# Patient Record
Sex: Male | Born: 1955
Health system: Southern US, Community
[De-identification: ages and names within clinical notes are randomized; demographics above are authoritative.]

## PROBLEM LIST (undated history)

## (undated) ENCOUNTER — Emergency Department (HOSPITAL_COMMUNITY): Admission: EM | Payer: Medicare HMO | Source: Home / Self Care

## (undated) DIAGNOSIS — I6529 Occlusion and stenosis of unspecified carotid artery: Secondary | ICD-10-CM

## (undated) DIAGNOSIS — J449 Chronic obstructive pulmonary disease, unspecified: Secondary | ICD-10-CM

## (undated) DIAGNOSIS — R9431 Abnormal electrocardiogram [ECG] [EKG]: Secondary | ICD-10-CM

## (undated) DIAGNOSIS — R0600 Dyspnea, unspecified: Secondary | ICD-10-CM

## (undated) DIAGNOSIS — I4891 Unspecified atrial fibrillation: Secondary | ICD-10-CM

## (undated) DIAGNOSIS — I2510 Atherosclerotic heart disease of native coronary artery without angina pectoris: Secondary | ICD-10-CM

## (undated) DIAGNOSIS — J9621 Acute and chronic respiratory failure with hypoxia: Secondary | ICD-10-CM

## (undated) DIAGNOSIS — F419 Anxiety disorder, unspecified: Secondary | ICD-10-CM

## (undated) DIAGNOSIS — I469 Cardiac arrest, cause unspecified: Secondary | ICD-10-CM

## (undated) HISTORY — PX: HERNIA REPAIR: SHX51

## (undated) HISTORY — PX: BACK SURGERY: SHX140

## (undated) HISTORY — PX: EYE SURGERY: SHX253

## (undated) HISTORY — DX: Occlusion and stenosis of unspecified carotid artery: I65.29

## (undated) HISTORY — PX: TONSILLECTOMY: SUR1361

## (undated) HISTORY — DX: Abnormal electrocardiogram (ECG) (EKG): R94.31

---

## 2018-02-11 DIAGNOSIS — R634 Abnormal weight loss: Secondary | ICD-10-CM | POA: Diagnosis not present

## 2018-02-11 DIAGNOSIS — Z1331 Encounter for screening for depression: Secondary | ICD-10-CM | POA: Diagnosis not present

## 2018-02-11 DIAGNOSIS — R29898 Other symptoms and signs involving the musculoskeletal system: Secondary | ICD-10-CM | POA: Diagnosis not present

## 2018-02-11 DIAGNOSIS — Z7689 Persons encountering health services in other specified circumstances: Secondary | ICD-10-CM | POA: Diagnosis not present

## 2018-02-16 DIAGNOSIS — R51 Headache: Secondary | ICD-10-CM | POA: Diagnosis not present

## 2018-02-16 DIAGNOSIS — R29898 Other symptoms and signs involving the musculoskeletal system: Secondary | ICD-10-CM | POA: Diagnosis not present

## 2018-02-28 DIAGNOSIS — I679 Cerebrovascular disease, unspecified: Secondary | ICD-10-CM | POA: Diagnosis not present

## 2018-02-28 DIAGNOSIS — I6930 Unspecified sequelae of cerebral infarction: Secondary | ICD-10-CM | POA: Diagnosis not present

## 2018-02-28 DIAGNOSIS — R51 Headache: Secondary | ICD-10-CM | POA: Diagnosis not present

## 2018-02-28 DIAGNOSIS — Z681 Body mass index (BMI) 19 or less, adult: Secondary | ICD-10-CM | POA: Diagnosis not present

## 2018-03-21 DIAGNOSIS — F17200 Nicotine dependence, unspecified, uncomplicated: Secondary | ICD-10-CM | POA: Diagnosis not present

## 2018-03-21 DIAGNOSIS — M542 Cervicalgia: Secondary | ICD-10-CM | POA: Diagnosis not present

## 2018-03-21 DIAGNOSIS — Z681 Body mass index (BMI) 19 or less, adult: Secondary | ICD-10-CM | POA: Diagnosis not present

## 2018-03-21 DIAGNOSIS — G8929 Other chronic pain: Secondary | ICD-10-CM | POA: Diagnosis not present

## 2018-04-04 DIAGNOSIS — Z681 Body mass index (BMI) 19 or less, adult: Secondary | ICD-10-CM | POA: Diagnosis not present

## 2018-04-04 DIAGNOSIS — F17200 Nicotine dependence, unspecified, uncomplicated: Secondary | ICD-10-CM | POA: Diagnosis not present

## 2018-05-03 ENCOUNTER — Ambulatory Visit: Payer: BLUE CROSS/BLUE SHIELD | Admitting: Neurology

## 2019-01-24 ENCOUNTER — Other Ambulatory Visit: Payer: Self-pay | Admitting: Orthopedic Surgery

## 2019-02-13 ENCOUNTER — Encounter (HOSPITAL_COMMUNITY): Payer: Self-pay

## 2019-02-13 ENCOUNTER — Other Ambulatory Visit: Payer: Self-pay

## 2019-02-13 ENCOUNTER — Encounter (HOSPITAL_COMMUNITY)
Admission: RE | Admit: 2019-02-13 | Discharge: 2019-02-13 | Disposition: A | Discharge status: Home / Self Care | Payer: Worker's Compensation | Source: Ambulatory Visit | Attending: Orthopedic Surgery | Admitting: Orthopedic Surgery

## 2019-02-13 ENCOUNTER — Other Ambulatory Visit (HOSPITAL_COMMUNITY)
Admission: RE | Admit: 2019-02-13 | Discharge: 2019-02-13 | Disposition: A | Discharge status: Home / Self Care | Payer: Worker's Compensation | Source: Ambulatory Visit | Attending: Orthopedic Surgery | Admitting: Orthopedic Surgery

## 2019-02-13 DIAGNOSIS — F17210 Nicotine dependence, cigarettes, uncomplicated: Secondary | ICD-10-CM | POA: Diagnosis not present

## 2019-02-13 DIAGNOSIS — Z1159 Encounter for screening for other viral diseases: Secondary | ICD-10-CM | POA: Insufficient documentation

## 2019-02-13 DIAGNOSIS — M4802 Spinal stenosis, cervical region: Secondary | ICD-10-CM | POA: Diagnosis present

## 2019-02-13 DIAGNOSIS — Z01812 Encounter for preprocedural laboratory examination: Secondary | ICD-10-CM | POA: Insufficient documentation

## 2019-02-13 DIAGNOSIS — M5412 Radiculopathy, cervical region: Secondary | ICD-10-CM | POA: Diagnosis not present

## 2019-02-13 LAB — COMPREHENSIVE METABOLIC PANEL
ALT: 14 U/L (ref 0–44)
AST: 16 U/L (ref 15–41)
Albumin: 4.1 g/dL (ref 3.5–5.0)
Alkaline Phosphatase: 77 U/L (ref 38–126)
Anion gap: 11 (ref 5–15)
BUN: 8 mg/dL (ref 8–23)
CO2: 27 mmol/L (ref 22–32)
Calcium: 9.3 mg/dL (ref 8.9–10.3)
Chloride: 100 mmol/L (ref 98–111)
Creatinine, Ser: 1.14 mg/dL (ref 0.61–1.24)
GFR calc Af Amer: >60 mL/min (ref >60)
GFR calc non Af Amer: >60 mL/min (ref >60)
Glucose, Bld: 113 mg/dL — ABNORMAL HIGH (ref 70–99)
Potassium: 3.9 mmol/L (ref 3.5–5.1)
Sodium: 138 mmol/L (ref 135–145)
Total Bilirubin: 1.3 mg/dL — ABNORMAL HIGH (ref 0.3–1.2)
Total Protein: 6.6 g/dL (ref 6.5–8.1)

## 2019-02-13 LAB — CBC WITH DIFFERENTIAL/PLATELET
Abs Immature Granulocytes: 0.01 10*3/uL (ref 0.00–0.07)
Basophils Absolute: 0 10*3/uL (ref 0.0–0.1)
Basophils Relative: 1 %
Eosinophils Absolute: 0 10*3/uL (ref 0.0–0.5)
Eosinophils Relative: 1 %
HCT: 49 % (ref 39.0–52.0)
Hemoglobin: 16.8 g/dL (ref 13.0–17.0)
Immature Granulocytes: 0 %
Lymphocytes Relative: 24 %
Lymphs Abs: 1.3 10*3/uL (ref 0.7–4.0)
MCH: 31.6 pg (ref 26.0–34.0)
MCHC: 34.3 g/dL (ref 30.0–36.0)
MCV: 92.1 fL (ref 80.0–100.0)
Monocytes Absolute: 0.3 10*3/uL (ref 0.1–1.0)
Monocytes Relative: 7 %
Neutro Abs: 3.5 10*3/uL (ref 1.7–7.7)
Neutrophils Relative %: 67 %
Platelets: 138 10*3/uL — ABNORMAL LOW (ref 150–400)
RBC: 5.32 MIL/uL (ref 4.22–5.81)
RDW: 11.4 % — ABNORMAL LOW (ref 11.5–15.5)
WBC: 5.2 10*3/uL (ref 4.0–10.5)
nRBC: 0 % (ref 0.0–0.2)

## 2019-02-13 LAB — URINALYSIS, ROUTINE W REFLEX MICROSCOPIC
Bilirubin Urine: NEGATIVE
Glucose, UA: NEGATIVE mg/dL
Hgb urine dipstick: NEGATIVE
Ketones, ur: NEGATIVE mg/dL
Leukocytes,Ua: NEGATIVE
Nitrite: NEGATIVE
Protein, ur: NEGATIVE mg/dL
Specific Gravity, Urine: 1.011 (ref 1.005–1.030)
pH: 5 (ref 5.0–8.0)

## 2019-02-13 LAB — PROTIME-INR
INR: 1.1 (ref 0.8–1.2)
Prothrombin Time: 14 seconds (ref 11.4–15.2)

## 2019-02-13 LAB — SURGICAL PCR SCREEN
MRSA, PCR: NEGATIVE
Staphylococcus aureus: NEGATIVE

## 2019-02-13 LAB — APTT: aPTT: 31 seconds (ref 24–36)

## 2019-02-13 LAB — SARS CORONAVIRUS 2 BY RT PCR (HOSPITAL ORDER, PERFORMED IN ~~LOC~~ HOSPITAL LAB): SARS Coronavirus 2: NEGATIVE

## 2019-02-13 NOTE — Progress Notes (Signed)
PCP - denies  DM - denies SA - denies  Anesthesia review: N/A  Patient denies shortness of breath, fever, cough and chest pain at PAT appointment   Patient verbalized understanding of instructions that were given to them at the PAT appointment. Patient was also instructed that they will need to review over the PAT instructions again at home before surgery.

## 2019-02-13 NOTE — Progress Notes (Signed)
[place], [place] [address] [place] [address] Phone: [phone] Fax: [phone]      Your procedure is scheduled on June 10th.  Report to [facility] Main Entrance "A" at 8:40 A.M., and check in at the Admitting office.  Call this number if you have problems the morning of surgery:  [phone]  Call [phone] if you have any questions prior to your surgery date Monday-Friday 8am-4pm    Remember:  Do not eat after midnight.  You may drink clear liquids until 4:30 AM .   Clear liquids allowed are: Water, Non-Citrus Juices (without pulp), Carbonated Beverages, Clear Tea, Black Coffee Only, and Gatorade  Please complete your PRE-SURGERY ENSURE that was provided to you 3 hours prior to you surgery start time.  Please, if able, drink it in one setting. DO NOT SIP.     Take these medicines the morning of surgery with A SIP OF WATER - NONE  7 days prior to surgery STOP taking any Aspirin (unless otherwise instructed by your surgeon), Aleve, Naproxen, Ibuprofen, Motrin, Advil, Goody's, BC's, all herbal medications, fish oil, and all vitamins.    The Morning of Surgery  Do not wear jewelry.  Do not wear lotions, powders, colognes, or deodorant  Men may shave face and neck.  Do not bring valuables to the hospital.  [facility] is not responsible for any belongings or valuables.  If you are a smoker, DO NOT Smoke 24 hours prior to surgery IF you wear a CPAP at night please bring your mask, tubing, and machine the morning of surgery   Remember that you must have someone to transport you home after your surgery, and remain with you for 24 hours if you are discharged the same day.   Contacts, glasses, hearing aids, dentures or bridgework may not be worn into surgery.    Leave your suitcase in the car.  After surgery it may be brought to your room.  For patients admitted to the hospital, discharge time will be  determined by your treatment team.  Patients discharged the day of surgery will not be allowed to drive home.    Special instructions:   [place]- Preparing For Surgery  Before surgery, you can play an important role. Because skin is not sterile, your skin needs to be as free of germs as possible. You can reduce the number of germs on your skin by washing with CHG (chlorahexidine gluconate) Soap before surgery.  CHG is an antiseptic cleaner which kills germs and bonds with the skin to continue killing germs even after washing.    Oral Hygiene is also important to reduce your risk of infection.  Remember - BRUSH YOUR TEETH THE MORNING OF SURGERY WITH YOUR REGULAR TOOTHPASTE  Please do not use if you have an allergy to CHG or antibacterial soaps. If your skin becomes reddened/irritated stop using the CHG.  Do not shave (including legs and underarms) for at least 48 hours prior to first CHG shower. It is OK to shave your face.  Please follow these instructions carefully.   1. Shower the NIGHT BEFORE SURGERY and the MORNING OF SURGERY with CHG Soap.   2. If you chose to wash your hair, wash your hair first as usual with your normal shampoo.  3. After you shampoo, rinse your hair and body thoroughly to remove the shampoo.  4. Use CHG as you would any other liquid soap. You can apply  CHG directly to the skin and wash gently with a scrungie or a clean washcloth.   5. Apply the CHG Soap to your body ONLY FROM THE NECK DOWN.  Do not use on open wounds or open sores. Avoid contact with your eyes, ears, mouth and genitals (private parts). Wash Face and genitals (private parts)  with your normal soap.   6. Wash thoroughly, paying special attention to the area where your surgery will be performed.  7. Thoroughly rinse your body with warm water from the neck down.  8. DO NOT shower/wash with your normal soap after using and rinsing off the CHG Soap.  9. Pat yourself dry with a CLEAN  TOWEL.  10. Wear CLEAN PAJAMAS to bed the night before surgery, wear comfortable clothes the morning of surgery  11. Place CLEAN SHEETS on your bed the night of your first shower and DO NOT SLEEP WITH PETS.    Day of Surgery:  Do not apply any deodorants/lotions.  Please wear clean clothes to the hospital/surgery center.   Remember to brush your teeth WITH YOUR REGULAR TOOTHPASTE.   Please read over the following fact sheets that you were given.

## 2019-02-15 ENCOUNTER — Encounter (HOSPITAL_COMMUNITY)
Admission: RE | Disposition: A | Discharge status: Home / Self Care | Payer: Self-pay | Source: Ambulatory Visit | Attending: Orthopedic Surgery

## 2019-02-15 ENCOUNTER — Ambulatory Visit (HOSPITAL_COMMUNITY): Payer: Worker's Compensation | Admitting: Physician Assistant

## 2019-02-15 ENCOUNTER — Ambulatory Visit (HOSPITAL_COMMUNITY): Payer: Worker's Compensation

## 2019-02-15 ENCOUNTER — Observation Stay (HOSPITAL_COMMUNITY)
Admission: RE | Admit: 2019-02-15 | Discharge: 2019-02-16 | Disposition: A | Discharge status: Home / Self Care | Payer: Worker's Compensation | Source: Ambulatory Visit | Attending: Orthopedic Surgery | Admitting: Orthopedic Surgery

## 2019-02-15 ENCOUNTER — Ambulatory Visit (HOSPITAL_COMMUNITY): Payer: Worker's Compensation | Admitting: Anesthesiology

## 2019-02-15 ENCOUNTER — Encounter (HOSPITAL_COMMUNITY): Payer: Self-pay

## 2019-02-15 ENCOUNTER — Other Ambulatory Visit: Payer: Self-pay

## 2019-02-15 DIAGNOSIS — M5412 Radiculopathy, cervical region: Secondary | ICD-10-CM | POA: Insufficient documentation

## 2019-02-15 DIAGNOSIS — M4802 Spinal stenosis, cervical region: Secondary | ICD-10-CM | POA: Diagnosis not present

## 2019-02-15 DIAGNOSIS — M5410 Radiculopathy, site unspecified: Secondary | ICD-10-CM

## 2019-02-15 DIAGNOSIS — F17210 Nicotine dependence, cigarettes, uncomplicated: Secondary | ICD-10-CM | POA: Insufficient documentation

## 2019-02-15 DIAGNOSIS — Z1159 Encounter for screening for other viral diseases: Secondary | ICD-10-CM | POA: Insufficient documentation

## 2019-02-15 DIAGNOSIS — Z419 Encounter for procedure for purposes other than remedying health state, unspecified: Secondary | ICD-10-CM

## 2019-02-15 HISTORY — DX: Radiculopathy, site unspecified: M54.10

## 2019-02-15 HISTORY — PX: ANTERIOR CERVICAL DECOMPRESSION/DISCECTOMY FUSION 4 LEVELS: SHX5556

## 2019-02-15 SURGERY — ANTERIOR CERVICAL DECOMPRESSION/DISCECTOMY FUSION 4 LEVELS
Anesthesia: General | Site: Spine Cervical | Laterality: Bilateral

## 2019-02-15 MED ORDER — LACTATED RINGERS IV SOLN
INTRAVENOUS | Status: DC | PRN
  Administered 2019-02-15 (×2): via INTRAVENOUS

## 2019-02-15 MED ORDER — SUCCINYLCHOLINE CHLORIDE 200 MG/10ML IV SOSY
PREFILLED_SYRINGE | INTRAVENOUS | Status: DC | PRN
  Administered 2019-02-15: 80 mg via INTRAVENOUS

## 2019-02-15 MED ORDER — SUGAMMADEX SODIUM 200 MG/2ML IV SOLN
INTRAVENOUS | Status: DC | PRN
  Administered 2019-02-15: 115.2 mg via INTRAVENOUS

## 2019-02-15 MED ORDER — BUPIVACAINE-EPINEPHRINE 0.25% -1:200000 IJ SOLN
INTRAMUSCULAR | Status: DC | PRN
  Administered 2019-02-15: 5 mL

## 2019-02-15 MED ORDER — CHLORHEXIDINE GLUCONATE 4 % EX LIQD: 60.0000 mL | Freq: Once | CUTANEOUS | Status: DC

## 2019-02-15 MED ORDER — FENTANYL CITRATE (PF) 100 MCG/2ML IJ SOLN
INTRAMUSCULAR | Status: AC
  Filled 2019-02-15: qty 2

## 2019-02-15 MED ORDER — SODIUM CHLORIDE 0.9 % IV SOLN: 250.0000 mL | INTRAVENOUS | Status: DC

## 2019-02-15 MED ORDER — FENTANYL CITRATE (PF) 250 MCG/5ML IJ SOLN
INTRAMUSCULAR | Status: DC | PRN
  Administered 2019-02-15 (×3): 50 ug via INTRAVENOUS
  Administered 2019-02-15: 100 ug via INTRAVENOUS

## 2019-02-15 MED ORDER — GABAPENTIN 300 MG PO CAPS
300.0000 mg | ORAL_CAPSULE | Freq: Once | ORAL | Status: AC
  Administered 2019-02-15: 07:00:00 300 mg via ORAL

## 2019-02-15 MED ORDER — ROCURONIUM BROMIDE 10 MG/ML (PF) SYRINGE
PREFILLED_SYRINGE | INTRAVENOUS | Status: DC | PRN
  Administered 2019-02-15: 50 mg via INTRAVENOUS
  Administered 2019-02-15: 20 mg via INTRAVENOUS

## 2019-02-15 MED ORDER — ACETAMINOPHEN 650 MG RE SUPP: 650.0000 mg | RECTAL | Status: DC | PRN

## 2019-02-15 MED ORDER — FENTANYL CITRATE (PF) 250 MCG/5ML IJ SOLN
INTRAMUSCULAR | Status: AC
  Filled 2019-02-15: qty 5

## 2019-02-15 MED ORDER — ZOLPIDEM TARTRATE 5 MG PO TABS
5.0000 mg | ORAL_TABLET | Freq: Every evening | ORAL | Status: DC | PRN
  Administered 2019-02-15: 20:00:00 5 mg via ORAL
  Filled 2019-02-15: qty 1

## 2019-02-15 MED ORDER — PHENYLEPHRINE 40 MCG/ML (10ML) SYRINGE FOR IV PUSH (FOR BLOOD PRESSURE SUPPORT)
PREFILLED_SYRINGE | INTRAVENOUS | Status: DC | PRN
  Administered 2019-02-15 (×2): 120 ug via INTRAVENOUS

## 2019-02-15 MED ORDER — GLYCOPYRROLATE PF 0.2 MG/ML IJ SOSY
PREFILLED_SYRINGE | INTRAMUSCULAR | Status: DC | PRN
  Administered 2019-02-15: .2 mg via INTRAVENOUS

## 2019-02-15 MED ORDER — FLEET ENEMA 7-19 GM/118ML RE ENEM: 1.0000 | ENEMA | Freq: Once | RECTAL | Status: DC | PRN

## 2019-02-15 MED ORDER — DOCUSATE SODIUM 100 MG PO CAPS
100.0000 mg | ORAL_CAPSULE | Freq: Two times a day (BID) | ORAL | Status: DC
  Administered 2019-02-15 (×2): 100 mg via ORAL
  Filled 2019-02-15 (×2): qty 1

## 2019-02-15 MED ORDER — MIDAZOLAM HCL 2 MG/2ML IJ SOLN
INTRAMUSCULAR | Status: DC | PRN
  Administered 2019-02-15: 2 mg via INTRAVENOUS

## 2019-02-15 MED ORDER — THROMBIN 20000 UNITS EX SOLR
CUTANEOUS | Status: AC
  Filled 2019-02-15: qty 20000

## 2019-02-15 MED ORDER — DIAZEPAM 5 MG PO TABS: 5.0000 mg | ORAL_TABLET | Freq: Four times a day (QID) | ORAL | Status: DC | PRN

## 2019-02-15 MED ORDER — ACETAMINOPHEN 500 MG PO TABS
ORAL_TABLET | ORAL | Status: AC
  Administered 2019-02-15: 07:00:00 1000 mg via ORAL
  Filled 2019-02-15: qty 2

## 2019-02-15 MED ORDER — SODIUM CHLORIDE 0.9% FLUSH
3.0000 mL | INTRAVENOUS | Status: DC | PRN
  Administered 2019-02-15: 3 mL via INTRAVENOUS
  Filled 2019-02-15: qty 3

## 2019-02-15 MED ORDER — ONDANSETRON HCL 4 MG/2ML IJ SOLN
INTRAMUSCULAR | Status: AC
  Filled 2019-02-15: qty 2

## 2019-02-15 MED ORDER — PROMETHAZINE HCL 25 MG/ML IJ SOLN: 6.2500 mg | INTRAMUSCULAR | Status: DC | PRN

## 2019-02-15 MED ORDER — DEXMEDETOMIDINE HCL IN NACL 200 MCG/50ML IV SOLN
INTRAVENOUS | Status: DC | PRN
  Administered 2019-02-15: 12 ug via INTRAVENOUS

## 2019-02-15 MED ORDER — SENNOSIDES-DOCUSATE SODIUM 8.6-50 MG PO TABS
1.0000 | ORAL_TABLET | Freq: Every evening | ORAL | Status: DC | PRN
  Administered 2019-02-15: 1 via ORAL
  Filled 2019-02-15: qty 1

## 2019-02-15 MED ORDER — DEXMEDETOMIDINE HCL IN NACL 80 MCG/20ML IV SOLN
INTRAVENOUS | Status: AC
  Filled 2019-02-15: qty 20

## 2019-02-15 MED ORDER — ONDANSETRON HCL 4 MG/2ML IJ SOLN
INTRAMUSCULAR | Status: DC | PRN
  Administered 2019-02-15: 4 mg via INTRAVENOUS

## 2019-02-15 MED ORDER — ACETAMINOPHEN 500 MG PO TABS
1000.0000 mg | ORAL_TABLET | Freq: Once | ORAL | Status: AC
  Administered 2019-02-15: 07:00:00 1000 mg via ORAL

## 2019-02-15 MED ORDER — LIDOCAINE 2% (20 MG/ML) 5 ML SYRINGE
INTRAMUSCULAR | Status: DC | PRN
  Administered 2019-02-15: 60 mg via INTRAVENOUS

## 2019-02-15 MED ORDER — LIDOCAINE 2% (20 MG/ML) 5 ML SYRINGE
INTRAMUSCULAR | Status: AC
  Filled 2019-02-15: qty 5

## 2019-02-15 MED ORDER — ONDANSETRON HCL 4 MG/2ML IJ SOLN: 4.0000 mg | Freq: Four times a day (QID) | INTRAMUSCULAR | Status: DC | PRN

## 2019-02-15 MED ORDER — PHENOL 1.4 % MT LIQD: 1.0000 | OROMUCOSAL | Status: DC | PRN

## 2019-02-15 MED ORDER — PROPOFOL 10 MG/ML IV BOLUS
INTRAVENOUS | Status: DC | PRN
  Administered 2019-02-15: 120 mg via INTRAVENOUS

## 2019-02-15 MED ORDER — CEFAZOLIN SODIUM-DEXTROSE 2-4 GM/100ML-% IV SOLN
INTRAVENOUS | Status: AC
  Filled 2019-02-15: qty 100

## 2019-02-15 MED ORDER — ACETAMINOPHEN 325 MG PO TABS: 650.0000 mg | ORAL_TABLET | ORAL | Status: DC | PRN

## 2019-02-15 MED ORDER — KETAMINE HCL 10 MG/ML IJ SOLN
INTRAMUSCULAR | Status: DC | PRN
  Administered 2019-02-15: 20 mg via INTRAVENOUS
  Administered 2019-02-15: 10 mg via INTRAVENOUS

## 2019-02-15 MED ORDER — PANTOPRAZOLE SODIUM 40 MG IV SOLR
40.0000 mg | Freq: Every day | INTRAVENOUS | Status: DC
  Administered 2019-02-15: 40 mg via INTRAVENOUS
  Filled 2019-02-15: qty 40

## 2019-02-15 MED ORDER — THROMBIN 20000 UNITS EX SOLR
CUTANEOUS | Status: DC | PRN
  Administered 2019-02-15: 09:00:00 20 mL via TOPICAL

## 2019-02-15 MED ORDER — SODIUM CHLORIDE 0.9% FLUSH
3.0000 mL | Freq: Two times a day (BID) | INTRAVENOUS | Status: DC
  Administered 2019-02-15: 3 mL via INTRAVENOUS

## 2019-02-15 MED ORDER — CEFAZOLIN SODIUM-DEXTROSE 2-4 GM/100ML-% IV SOLN
2.0000 g | INTRAVENOUS | Status: AC
  Administered 2019-02-15: 2 g via INTRAVENOUS

## 2019-02-15 MED ORDER — ONDANSETRON HCL 4 MG PO TABS: 4.0000 mg | ORAL_TABLET | Freq: Four times a day (QID) | ORAL | Status: DC | PRN

## 2019-02-15 MED ORDER — DEXAMETHASONE SODIUM PHOSPHATE 10 MG/ML IJ SOLN
INTRAMUSCULAR | Status: AC
  Filled 2019-02-15: qty 1

## 2019-02-15 MED ORDER — KETAMINE HCL 50 MG/5ML IJ SOSY
PREFILLED_SYRINGE | INTRAMUSCULAR | Status: AC
  Filled 2019-02-15: qty 5

## 2019-02-15 MED ORDER — GLYCOPYRROLATE PF 0.2 MG/ML IJ SOSY
PREFILLED_SYRINGE | INTRAMUSCULAR | Status: AC
  Filled 2019-02-15: qty 1

## 2019-02-15 MED ORDER — SODIUM CHLORIDE 0.9 % IV SOLN
INTRAVENOUS | Status: DC | PRN
  Administered 2019-02-15: 25 ug/min via INTRAVENOUS

## 2019-02-15 MED ORDER — BUPIVACAINE-EPINEPHRINE (PF) 0.25% -1:200000 IJ SOLN
INTRAMUSCULAR | Status: AC
  Filled 2019-02-15: qty 30

## 2019-02-15 MED ORDER — ROCURONIUM BROMIDE 10 MG/ML (PF) SYRINGE
PREFILLED_SYRINGE | INTRAVENOUS | Status: AC
  Filled 2019-02-15: qty 10

## 2019-02-15 MED ORDER — ALUM & MAG HYDROXIDE-SIMETH 200-200-20 MG/5ML PO SUSP: 30.0000 mL | Freq: Four times a day (QID) | ORAL | Status: DC | PRN

## 2019-02-15 MED ORDER — BISACODYL 5 MG PO TBEC: 5.0000 mg | DELAYED_RELEASE_TABLET | Freq: Every day | ORAL | Status: DC | PRN

## 2019-02-15 MED ORDER — CEFAZOLIN SODIUM-DEXTROSE 2-4 GM/100ML-% IV SOLN
2.0000 g | Freq: Three times a day (TID) | INTRAVENOUS | Status: AC
  Administered 2019-02-15 – 2019-02-16 (×2): 2 g via INTRAVENOUS
  Filled 2019-02-15 (×2): qty 100

## 2019-02-15 MED ORDER — PHENYLEPHRINE HCL-NACL 10-0.9 MG/250ML-% IV SOLN
INTRAVENOUS | Status: AC
  Filled 2019-02-15: qty 250

## 2019-02-15 MED ORDER — MENTHOL 3 MG MT LOZG: 1.0000 | LOZENGE | OROMUCOSAL | Status: DC | PRN

## 2019-02-15 MED ORDER — MIDAZOLAM HCL 2 MG/2ML IJ SOLN
INTRAMUSCULAR | Status: AC
  Filled 2019-02-15: qty 2

## 2019-02-15 MED ORDER — PROPOFOL 10 MG/ML IV BOLUS
INTRAVENOUS | Status: AC
  Filled 2019-02-15: qty 20

## 2019-02-15 MED ORDER — FENTANYL CITRATE (PF) 100 MCG/2ML IJ SOLN
25.0000 ug | INTRAMUSCULAR | Status: DC | PRN
  Administered 2019-02-15: 50 ug via INTRAVENOUS

## 2019-02-15 MED ORDER — 0.9 % SODIUM CHLORIDE (POUR BTL) OPTIME
TOPICAL | Status: DC | PRN
  Administered 2019-02-15: 09:00:00 1000 mL

## 2019-02-15 MED ORDER — DEXAMETHASONE SODIUM PHOSPHATE 10 MG/ML IJ SOLN
INTRAMUSCULAR | Status: DC | PRN
  Administered 2019-02-15: 10 mg via INTRAVENOUS

## 2019-02-15 MED ORDER — GABAPENTIN 300 MG PO CAPS
ORAL_CAPSULE | ORAL | Status: AC
  Administered 2019-02-15: 300 mg via ORAL
  Filled 2019-02-15: qty 1

## 2019-02-15 MED ORDER — SUCCINYLCHOLINE CHLORIDE 200 MG/10ML IV SOSY
PREFILLED_SYRINGE | INTRAVENOUS | Status: AC
  Filled 2019-02-15: qty 10

## 2019-02-15 MED ORDER — PHENYLEPHRINE 40 MCG/ML (10ML) SYRINGE FOR IV PUSH (FOR BLOOD PRESSURE SUPPORT)
PREFILLED_SYRINGE | INTRAVENOUS | Status: AC
  Filled 2019-02-15: qty 10

## 2019-02-15 MED ORDER — OXYCODONE-ACETAMINOPHEN 5-325 MG PO TABS
1.0000 | ORAL_TABLET | ORAL | Status: DC | PRN
  Administered 2019-02-15 – 2019-02-16 (×3): 2 via ORAL
  Filled 2019-02-15 (×3): qty 2

## 2019-02-15 SURGICAL SUPPLY — 79 items
BENZOIN TINCTURE PRP APPL 2/3 (GAUZE/BANDAGES/DRESSINGS) ×3 IMPLANT
BIT DRILL NEURO 2X3.1 SFT TUCH (MISCELLANEOUS) ×1 IMPLANT
BIT DRILL SRG 14X2.2XFLT CHK (BIT) ×1 IMPLANT
BIT DRL SRG 14X2.2XFLT CHK (BIT) ×1
BLADE CLIPPER SURG (BLADE) ×3 IMPLANT
BLADE SURG 15 STRL LF DISP TIS (BLADE) ×1 IMPLANT
BLADE SURG 15 STRL SS (BLADE) ×2
BONE VIVIGEN FORMABLE 5.4CC (Bone Implant) ×3 IMPLANT
BUR MATCHSTICK NEURO 3.0 LAGG (BURR) IMPLANT
CARTRIDGE OIL MAESTRO DRILL (MISCELLANEOUS) ×1 IMPLANT
CLOSURE STERI-STRIP 1/2X4 (GAUZE/BANDAGES/DRESSINGS) ×1
CLOSURE WOUND 1/2 X4 (GAUZE/BANDAGES/DRESSINGS) ×1
CLSR STERI-STRIP ANTIMIC 1/2X4 (GAUZE/BANDAGES/DRESSINGS) ×2 IMPLANT
COVER SURGICAL LIGHT HANDLE (MISCELLANEOUS) IMPLANT
COVER WAND RF STERILE (DRAPES) ×3 IMPLANT
CRADLE DONUT ADULT HEAD (MISCELLANEOUS) ×3 IMPLANT
DECANTER SPIKE VIAL GLASS SM (MISCELLANEOUS) ×3 IMPLANT
DIFFUSER DRILL AIR PNEUMATIC (MISCELLANEOUS) ×3 IMPLANT
DRAIN JACKSON RD 7FR 3/32 (WOUND CARE) IMPLANT
DRAPE C-ARM 42X72 X-RAY (DRAPES) ×3 IMPLANT
DRAPE POUCH INSTRU U-SHP 10X18 (DRAPES) ×3 IMPLANT
DRAPE SURG 17X23 STRL (DRAPES) ×12 IMPLANT
DRILL BIT SKYLINE 14MM (BIT) ×2
DRILL NEURO 2X3.1 SOFT TOUCH (MISCELLANEOUS) ×3
DURAPREP 26ML APPLICATOR (WOUND CARE) ×3 IMPLANT
ELECT COATED BLADE 2.86 ST (ELECTRODE) ×6 IMPLANT
ELECT REM PT RETURN 9FT ADLT (ELECTROSURGICAL) ×3
ELECTRODE REM PT RTRN 9FT ADLT (ELECTROSURGICAL) ×1 IMPLANT
EVACUATOR SILICONE 100CC (DRAIN) IMPLANT
GAUZE 4X4 16PLY RFD (DISPOSABLE) ×3 IMPLANT
GAUZE SPONGE 4X4 12PLY STRL (GAUZE/BANDAGES/DRESSINGS) ×3 IMPLANT
GAUZE SPONGE 4X4 12PLY STRL LF (GAUZE/BANDAGES/DRESSINGS) ×3 IMPLANT
GLOVE BIO SURGEON STRL SZ7 (GLOVE) ×3 IMPLANT
GLOVE BIO SURGEON STRL SZ8 (GLOVE) ×3 IMPLANT
GLOVE BIOGEL PI IND STRL 7.0 (GLOVE) ×3 IMPLANT
GLOVE BIOGEL PI IND STRL 8 (GLOVE) ×3 IMPLANT
GLOVE BIOGEL PI INDICATOR 7.0 (GLOVE) ×6
GLOVE BIOGEL PI INDICATOR 8 (GLOVE) ×6
GLOVE ECLIPSE 7.5 STRL STRAW (GLOVE) ×12 IMPLANT
GOWN STRL REUS W/ TWL LRG LVL3 (GOWN DISPOSABLE) ×1 IMPLANT
GOWN STRL REUS W/ TWL XL LVL3 (GOWN DISPOSABLE) ×1 IMPLANT
GOWN STRL REUS W/TWL LRG LVL3 (GOWN DISPOSABLE) ×2
GOWN STRL REUS W/TWL XL LVL3 (GOWN DISPOSABLE) ×2
INTERLOCK LRDTC CRVCL VBR 6MM (Bone Implant) ×2 IMPLANT
INTERLOCK LRDTC CRVCL VBR 7MM (Bone Implant) ×1 IMPLANT
IV CATH 14GX2 1/4 (CATHETERS) ×3 IMPLANT
KIT BASIN OR (CUSTOM PROCEDURE TRAY) ×3 IMPLANT
KIT TURNOVER KIT B (KITS) ×3 IMPLANT
LORDOTIC CERVICAL VBR 6MM SM (Bone Implant) ×6 IMPLANT
LORDOTIC CERVICAL VBR 7MM SM (Bone Implant) ×3 IMPLANT
MANIFOLD NEPTUNE II (INSTRUMENTS) IMPLANT
NEEDLE PRECISIONGLIDE 27X1.5 (NEEDLE) ×3 IMPLANT
NEEDLE SPNL 20GX3.5 QUINCKE YW (NEEDLE) ×3 IMPLANT
NS IRRIG 1000ML POUR BTL (IV SOLUTION) ×3 IMPLANT
OIL CARTRIDGE MAESTRO DRILL (MISCELLANEOUS) ×3
PACK ORTHO CERVICAL (CUSTOM PROCEDURE TRAY) ×3 IMPLANT
PAD ARMBOARD 7.5X6 YLW CONV (MISCELLANEOUS) ×6 IMPLANT
PATTIES SURGICAL .5 X.5 (GAUZE/BANDAGES/DRESSINGS) IMPLANT
PATTIES SURGICAL .5 X1 (DISPOSABLE) IMPLANT
PENCIL BUTTON HOLSTER BLD 10FT (ELECTRODE) ×3 IMPLANT
PIN DISTRACTION 14 (PIN) ×6 IMPLANT
PLATE SKYLINE 3 LVL 48MM (Plate) ×3 IMPLANT
SCREW SKYLINE VAR OS 14MM (Screw) ×24 IMPLANT
SPONGE INTESTINAL PEANUT (DISPOSABLE) ×6 IMPLANT
SPONGE SURGIFOAM ABS GEL 100 (HEMOSTASIS) ×3 IMPLANT
STRIP CLOSURE SKIN 1/2X4 (GAUZE/BANDAGES/DRESSINGS) ×2 IMPLANT
SURGIFLO W/THROMBIN 8M KIT (HEMOSTASIS) IMPLANT
SUT MNCRL AB 4-0 PS2 18 (SUTURE) ×3 IMPLANT
SUT VIC AB 2-0 CT2 18 VCP726D (SUTURE) ×3 IMPLANT
SYR BULB IRRIGATION 50ML (SYRINGE) ×3 IMPLANT
SYR CONTROL 10ML LL (SYRINGE) ×6 IMPLANT
TAPE CLOTH 4X10 WHT NS (GAUZE/BANDAGES/DRESSINGS) IMPLANT
TAPE CLOTH SURG 4X10 WHT LF (GAUZE/BANDAGES/DRESSINGS) ×3 IMPLANT
TAPE UMBILICAL COTTON 1/8X30 (MISCELLANEOUS) ×3 IMPLANT
TOWEL OR 17X24 6PK STRL BLUE (TOWEL DISPOSABLE) ×3 IMPLANT
TOWEL OR 17X26 10 PK STRL BLUE (TOWEL DISPOSABLE) ×3 IMPLANT
TRAY FOLEY MTR SLVR 16FR STAT (SET/KITS/TRAYS/PACK) ×3 IMPLANT
WATER STERILE IRR 1000ML POUR (IV SOLUTION) ×3 IMPLANT
YANKAUER SUCT BULB TIP NO VENT (SUCTIONS) ×3 IMPLANT

## 2019-02-15 NOTE — H&P (Signed)
PREOPERATIVE H&P  Chief Complaint: Left arm pain  HPI: Tony Munoz is a 63 y.o. male who presents with ongoing pain in the left arm  MRI reveals severe NF stenosis spanning C4-C7  Patient has failed multiple forms of conservative care and continues to have pain (see office notes for additional details regarding the patient's full course of treatment)  History reviewed. No pertinent past medical history. Past Surgical History:  Procedure Laterality Date  . EYE SURGERY Bilateral    Cataracts  . HERNIA REPAIR     double hernia - 2818yrs ago   Social History   Socioeconomic History  . Marital status: Single    Spouse name: Not on file  . Number of children: Not on file  . Years of education: Not on file  . Highest education level: Not on file  Occupational History  . Not on file  Social Needs  . Financial resource strain: Not on file  . Food insecurity:    Worry: Not on file    Inability: Not on file  . Transportation needs:    Medical: Not on file    Non-medical: Not on file  Tobacco Use  . Smoking status: Current Every Day Smoker    Packs/day: 1.50    Types: Cigarettes  . Smokeless tobacco: Never Used  Substance and Sexual Activity  . Alcohol use: Not on file    Comment: on occassion  . Drug use: Never  . Sexual activity: Not on file  Lifestyle  . Physical activity:    Days per week: Not on file    Minutes per session: Not on file  . Stress: Not on file  Relationships  . Social connections:    Talks on phone: Not on file    Gets together: Not on file    Attends religious service: Not on file    Active member of club or organization: Not on file    Attends meetings of clubs or organizations: Not on file    Relationship status: Not on file  Other Topics Concern  . Not on file  Social History Narrative  . Not on file   History reviewed. No pertinent family history. No Known Allergies Prior to Admission medications   Medication Sig Start Date End  Date Taking? Authorizing Provider  diclofenac sodium (VOLTAREN) 1 % GEL Apply 1 application topically 3 (three) times daily as needed. 12/17/18  Yes [provider]  Multiple Vitamins-Minerals (MULTIVITAMIN WITH MINERALS) tablet Take 1 tablet by mouth daily.   Yes [provider]  traMADol (ULTRAM) 50 MG tablet Take 50 mg by mouth 3 (three) times daily as needed. for pain 01/09/19  Yes [provider]     All other systems have been reviewed and were otherwise negative with the exception of those mentioned in the HPI and as above.  Physical Exam: Vitals:   02/15/19 0727  BP: 112/66  Pulse: 71  Resp: 18  Temp: 97.8 F (36.6 C)  SpO2: 97%    Body mass index is 18.75 kg/m.  General: Alert, no acute distress Cardiovascular: No pedal edema Respiratory: No cyanosis, no use of accessory musculature Skin: No lesions in the area of chief complaint Neurologic: Sensation intact distally Psychiatric: Patient is competent for consent with normal mood and affect Lymphatic: No axillary or cervical lymphadenopathy  MUSCULOSKELETAL: + spurling sign on the left  Assessment/Plan: LEFT ARM PAIN Plan for Procedure(s): ANTERIOR CERVICAL DECOMPRESSION FUSION, CERVICAL 4-5, CERVICAL 5-6, CERVICAL 6-7 WITH  INSTRUMENTATION AND ALLOGRAFT   Norva Karvonen, MD 02/15/2019 8:10 AM

## 2019-02-15 NOTE — Progress Notes (Signed)
Orthopedic Tech Progress Note Patient Details:  Tony Munoz [birth year] [id number] RN called for a philly collar for patient Ortho Devices Type of Ortho Device: Philadelphia cervical collar Ortho Device/Splint Location: neck Ortho Device/Splint Interventions: Other (comment)   Post Interventions Patient Tolerated: Other (comment) Instructions Provided: Other (comment), Care of device   [name] 02/15/2019, 2:54 PM

## 2019-02-15 NOTE — Anesthesia Postprocedure Evaluation (Signed)
Anesthesia Post Note  Patient: Tony Munoz  Procedure(s) Performed: ANTERIOR CERVICAL DECOMPRESSION FUSION, CERVICAL FOUR-FIVE, CERVICAL FIVE-SIX, CERVICAL SIX-SEVEN WITH INSTRUMENTATION AND ALLOGRAFT (Bilateral Spine Cervical)     Patient location during evaluation: PACU Anesthesia Type: General Level of consciousness: awake and alert, awake and oriented Pain management: pain level controlled Vital Signs Assessment: post-procedure vital signs reviewed and stable Respiratory status: spontaneous breathing, nonlabored ventilation, respiratory function stable and patient connected to nasal cannula oxygen Cardiovascular status: blood pressure returned to baseline and stable Postop Assessment: no apparent nausea or vomiting Anesthetic complications: no    Last Vitals:  Vitals:   02/15/19 1328 02/15/19 1345  BP: 117/74 115/69  Pulse: 82 77  Resp: 16 18  Temp: 36.5 C 36.5 C  SpO2: 93% 96%    Last Pain:  Vitals:   02/15/19 1345  TempSrc: Oral  PainSc:                  Catalina Gravel

## 2019-02-15 NOTE — Op Note (Signed)
PATIENT NAME: Tony Munoz   MEDICAL RECORD NO.:   [id number]    PHYSICIAN:  Phylliss Bob, MD      DATE OF BIRTH: 13-Aug-1956  ASSISTANT: Pricilla Holm, PA-C   DATE OF PROCEDURE: 02/15/2019                               OPERATIVE REPORT     PREOPERATIVE DIAGNOSES: 1. Left-sided cervical radiculopathy. 2. Spinal stenosis spanning C4-C7.   POSTOPERATIVE DIAGNOSES: 1. Left-sided cervical radiculopathy. 2. Spinal stenosis spanning C4-C7.   PROCEDURE: 1. Anterior cervical decompression and fusion C4/5, C5/6, C6/7. 2. Placement of anterior instrumentation, C4-C7. 3. Insertion of interbody device x 3 (Titan intervertebral spacers). 4. Intraoperative use of fluoroscopy. 5. Use of morselized allograft - ViviGen.   SURGEON:  Phylliss Bob, MD   ASSISTANT:  Pricilla Holm, PA-C.   ANESTHESIA:  General endotracheal anesthesia.   COMPLICATIONS:  None.   DISPOSITION:  Stable.   ESTIMATED BLOOD LOSS:  Minimal.   INDICATIONS FOR SURGERY:  Briefly, Mr. Diemer is a pleasant 63 y.o. year- old male, who did present to me with severe pain in the neck and left Arm, beginning at the time of a work injury just over 1 year ago.   The patient's MRI did reveal the findings noted above.  Given the patient's ongoing rather debilitating pain and lack of improvement with appropriate treatment measures, we did discuss proceeding with the procedure noted above.  The patient was fully aware of the risks and limitations of surgery as outlined in my preoperative note.   OPERATIVE DETAILS:  On 02/15/2019  the patient was brought to surgery and general endotracheal anesthesia was administered.  The patient was placed supine on the hospital bed. The neck was gently extended.  All bony prominences were meticulously padded.  The neck was prepped and draped in the usual sterile fashion.  At this point, I did make a left-sided transverse incision.  The platysma was incised.  A Smith-Robinson approach was  used and the anterior spine was identified. A self-retaining retractor was placed.  I then subperiosteally exposed the vertebral bodies from C4-C7. Substantial osteophytes were noted anteriorly, which were removed using a rongeur.   Caspar pins were then placed into the C6 and C7 vertebral bodies and distraction was applied.  A thorough and complete C6-7 intervertebral diskectomy was performed.  The posterior longitudinal ligament was identified and entered using a nerve hook.  I then used #1 followed by #2 Kerrison to perform a thorough and complete intervertebral diskectomy.  The spinal canal was thoroughly decompressed, as was the right and left neuroforamen.  The endplates were then prepared and the appropriate-sized intervertebral spacer was then packed with ViviGen and tamped into position in the usual fashion.  The lower Caspar pin was then removed and placed into the C5 vertebral body and once again, distraction was applied across the C5-6 intervertebral space.  I then again performed a thorough and complete diskectomy, thoroughly decompressing the spinal canal and bilateral neuroforamena.  After preparing the endplates, the appropriate-sized intervertebral spacer was packed with ViviGen and tamped into position.  The lower Caspar pin was then removed and placed into the C4 vertebral body and once again, distraction was applied across the C4-5 intervertebral space.  I then again performed a thorough and complete diskectomy, thoroughly decompressing the spinal canal and bilateral neuroforamena.  After preparing the endplates, the appropriate-sized intervertebral spacer was packed with ViviGen  and tamped into position.  The Caspar pins then were removed and bone wax was placed in their place.  The appropriate-sized anterior cervical plate was placed over the anterior spine.  14 mm variable angle screws were placed, 2 in each vertebral body from C4-C7 for a total of 8 vertebral body  screws.  The screws were then locked to the plate using the Cam locking mechanism.  I was very pleased with the final fluoroscopic images.  The wound was then irrigated.  The wound was then explored for any undue bleeding and there was no bleeding noted. The wound was then closed in layers using 2-0 Vicryl, followed by 4-0 Monocryl.  Benzoin and Steri-Strips were applied, followed by sterile dressing.  All instrument counts were correct at the termination of the procedure.   Of note, Kayla McKenzieJason Coop, PA-C, was my assistant throughout surgery, and did aid in retraction, suctioning, and closure from start to finish.         Estill BambergMark Rj Pedrosa, MD

## 2019-02-15 NOTE — Transfer of Care (Signed)
Immediate Anesthesia Transfer of Care Note  Patient: Tony Munoz  Procedure(s) Performed: ANTERIOR CERVICAL DECOMPRESSION FUSION, CERVICAL FOUR-FIVE, CERVICAL FIVE-SIX, CERVICAL SIX-SEVEN WITH INSTRUMENTATION AND ALLOGRAFT (Bilateral Spine Cervical)  Patient Location: PACU  Anesthesia Type:General  Level of Consciousness: awake, alert  and oriented  Airway & Oxygen Therapy: Patient Spontanous Breathing and Patient connected to face mask oxygen  Post-op Assessment: Report given to RN and Post -op Vital signs reviewed and stable  Post vital signs: Reviewed and stable  Last Vitals:  Vitals Value Taken Time  BP 112/64 02/15/2019 12:22 PM  Temp 36.5 C 02/15/2019 12:22 PM  Pulse 78 02/15/2019 12:27 PM  Resp 10 02/15/2019 12:27 PM  SpO2 97 % 02/15/2019 12:27 PM  Vitals shown include unvalidated device data.  Last Pain:  Vitals:   02/15/19 0727  TempSrc: Oral  PainSc:          Complications: No apparent anesthesia complications

## 2019-02-15 NOTE — Anesthesia Procedure Notes (Signed)
Procedure Name: Intubation Date/Time: 02/15/2019 8:30 AM Performed by: Alain Marion, CRNA Pre-anesthesia Checklist: Patient identified, Emergency Drugs available, Suction available and Patient being monitored Patient Re-evaluated:Patient Re-evaluated prior to induction Oxygen Delivery Method: Circle System Utilized Preoxygenation: Pre-oxygenation with 100% oxygen Induction Type: IV induction and Rapid sequence Laryngoscope Size: Miller and 2 Grade View: Grade I Tube type: Oral Tube size: 7.5 mm Number of attempts: 1 Airway Equipment and Method: Stylet and Oral airway Placement Confirmation: ETT inserted through vocal cords under direct vision,  positive ETCO2 and breath sounds checked- equal and bilateral Secured at: 22 cm Tube secured with: Tape Dental Injury: Teeth and Oropharynx as per pre-operative assessment

## 2019-02-15 NOTE — Anesthesia Preprocedure Evaluation (Addendum)
Anesthesia Evaluation  Patient identified by MRN, date of birth, ID band Patient awake    Reviewed: Allergy & Precautions, NPO status , Patient's Chart, lab work & pertinent test results  Airway Mallampati: II  TM Distance: >3 FB Neck ROM: Full    Dental  (+) Dental Advisory Given, Edentulous Upper, Edentulous Lower   Pulmonary Current Smoker,    Pulmonary exam normal breath sounds clear to auscultation       Cardiovascular Exercise Tolerance: Good negative cardio ROS Normal cardiovascular exam Rhythm:Regular Rate:Normal     Neuro/Psych LEFT ARM PAIN, WEAKNESS    GI/Hepatic negative GI ROS, Neg liver ROS,   Endo/Other  negative endocrine ROS  Renal/GU negative Renal ROS     Musculoskeletal negative musculoskeletal ROS (+)   Abdominal   Peds  Hematology  (+) Blood dyscrasia (Thrombocytopenia), ,   Anesthesia Other Findings Day of surgery medications reviewed with the patient.  Reproductive/Obstetrics                            Anesthesia Physical Anesthesia Plan  ASA: III  Anesthesia Plan: General   Post-op Pain Management:    Induction: Intravenous  PONV Risk Score and Plan: 1 and Ondansetron, Dexamethasone and Midazolam  Airway Management Planned: Oral ETT  Additional Equipment:   Intra-op Plan:   Post-operative Plan: Extubation in OR  Informed Consent: I have reviewed the patients History and Physical, chart, labs and discussed the procedure including the risks, benefits and alternatives for the proposed anesthesia with the patient or authorized representative who has indicated his/her understanding and acceptance.     Dental advisory given  Plan Discussed with: CRNA  Anesthesia Plan Comments:        Anesthesia Quick Evaluation

## 2019-02-16 ENCOUNTER — Encounter (HOSPITAL_COMMUNITY): Payer: Self-pay | Admitting: Orthopedic Surgery

## 2019-02-16 DIAGNOSIS — M4802 Spinal stenosis, cervical region: Secondary | ICD-10-CM | POA: Diagnosis not present

## 2019-02-16 MED ORDER — OXYCODONE-ACETAMINOPHEN 5-325 MG PO TABS: 1.0000 | ORAL_TABLET | ORAL | 0 refills | Status: DC | PRN

## 2019-02-16 MED ORDER — DIAZEPAM 5 MG PO TABS: 5.0000 mg | ORAL_TABLET | Freq: Four times a day (QID) | ORAL | 0 refills | Status: DC | PRN

## 2019-02-16 NOTE — Progress Notes (Signed)
Pt doing well. Pt given D/C instructions with verbal understanding. Rx's were sent to Pt's pharmacy by MD. Pt's incision is clean and dry with no sign of infection. Pt's IV was removed prior to D/C. Pt has Philly collar in his bag for home showering. Pt D/C'd home via walking @ 0830 per MD order. Pt is stable @ D/C and has no other needs at this time. Holli Humbles, RN

## 2019-02-16 NOTE — Discharge Instructions (Signed)
Anterior Cervical Diskectomy and Fusion, Care After °Refer to this sheet in the next few weeks. These instructions provide you with information about caring for yourself after your procedure. Your health care provider may also give you more specific instructions. Your treatment has been planned according to current medical practices, but problems sometimes occur. Call your health care provider if you have any problems or questions after your procedure. °What can I expect after the procedure? °After the procedure, it is common to have: °· Neck pain. °· Discomfort when swallowing. °· Slight hoarseness. °Follow these instructions at home: °If You Have a Neck Brace: °· Wear it as told by your health care provider. Remove it only as told by your health care provider. °· Keep the brace clean and dry. °Incision care ° °· Follow instructions from your health care provider about how to take care of the cut made during surgery (incision). Make sure you: °? Wash your hands with soap and water before you change your bandage (dressing). If soap and water are not available, use hand sanitizer. °? Change your dressing as told by your health care provider. °? Leave stitches (sutures), skin glue, or adhesive strips in place. These skin closures may need to stay in place for 2 weeks or longer. If adhesive strip edges start to loosen and curl up, you may trim the loose edges. Do not remove adhesive strips completely unless your health care provider tells you to do that. °· Check your incision every day for signs of infection. Watch for: °? Redness, swelling, or pain. °? Fluid or blood. °? Warmth. °? Pus or a bad smell. °Managing pain, stiffness, and swelling °· Take over-the-counter and prescription medicines only as told by your health care provider. °· If directed, apply ice to the injured area. °? Put ice in a plastic bag. °? Place a towel between your skin and the bag. °? Leave the ice on for 20 minutes, 2-3 times per  day. °Activity °· Return to your normal activities as told by your health care provider. Ask your health care provider what activities are safe for you. °· Do exercises as told by your health care provider. °· Do not lift anything that is heavier than 10 lb (4.5 kg). °General instructions °· Do not drive or operate heavy machinery while taking prescription pain medicines. °· Do not use any tobacco products, including cigarettes, chewing tobacco, or e-cigarettes. Tobacco can delay healing. If you need help quitting, ask your health care provider. °· Keep all follow-up visits and physical therapy appointments as told by your health care provider. This is important. °Contact a health care provider if: °· You have a fever. °· You have redness, swelling, or pain around your incision. °· You have fluid or blood coming from your incision. °· You have pus or a bad smell coming from your incision. °· You have pain that is not controlled by your pain medicine. °· You have increasing hoarseness or trouble swallowing. °Get help right away if: °· You have severe pain. °· You have sudden numbness or weakness in your arms. °· You have warmth, tenderness, or swelling in your calf. °· You have chest pain. °· You have difficulty breathing. °This information is not intended to replace advice given to you by your health care provider. Make sure you discuss any questions you have with your health care provider. °Document Released: 09/20/2015 Document Revised: 01/30/2016 Document Reviewed: 08/08/2015 °Elsevier Interactive Patient Education © 2019 [employer] ° °

## 2019-02-16 NOTE — Progress Notes (Signed)
    Patient doing well  Denies arm pain Tolerating PO well   Physical Exam: Vitals:   02/16/19 0348 02/16/19 0732  BP: 117/81 135/72  Pulse: 61 66  Resp: 18 18  Temp: 97.7 F (36.5 C)   SpO2: 97% 99%    Neck soft/supple Dressing in place NVI  POD #1 s/p ACDF, doing well  - encourage ambulation - Percocet for pain, Valium for muscle spasms - d/c home today with f/u in 2 weeks

## 2019-02-23 NOTE — Discharge Summary (Signed)
Patient ID: Tony Munoz MRN: [id number] DOB/AGE: 12-20-1955 63 y.o.  Admit date: 02/15/2019 Discharge date: 01/16/2019  Admission Diagnoses:  Active Problems:   Radiculopathy   Discharge Diagnoses:  Same  History reviewed. No pertinent past medical history.  Surgeries: Procedure(s): ANTERIOR CERVICAL DECOMPRESSION FUSION, CERVICAL FOUR-FIVE, CERVICAL FIVE-SIX, CERVICAL SIX-SEVEN WITH INSTRUMENTATION AND ALLOGRAFT on 02/15/2019   Consultants: None  Discharged Condition: Improved  Hospital Course: Tony Munoz is an 63 y.o. male who was admitted 02/15/2019 for operative treatment of radiculopathy. Patient has severe unremitting pain that affects sleep, daily activities, and work/hobbies. After pre-op clearance the patient was taken to the operating room on 02/15/2019 and underwent  Procedure(s): ANTERIOR CERVICAL DECOMPRESSION FUSION, CERVICAL FOUR-FIVE, CERVICAL FIVE-SIX, CERVICAL SIX-SEVEN WITH INSTRUMENTATION AND ALLOGRAFT.    Patient was given perioperative antibiotics:  Anti-infectives (From admission, onward)   Start     Dose/Rate Route Frequency Ordered Stop   02/15/19 1700  ceFAZolin (ANCEF) IVPB 2g/100 mL premix     2 g 200 mL/hr over 30 Minutes Intravenous Every 8 hours 02/15/19 1348 02/16/19 0047   02/15/19 0700  ceFAZolin (ANCEF) IVPB 2g/100 mL premix     2 g 200 mL/hr over 30 Minutes Intravenous On call to O.R. 02/15/19 2426 02/15/19 0843   02/15/19 0653  ceFAZolin (ANCEF) 2-4 GM/100ML-% IVPB    Note to Pharmacy: Cordelia Pen   : cabinet override      02/15/19 0653 02/15/19 0843       Patient was given sequential compression devices, early ambulation to prevent DVT.  Patient benefited maximally from hospital stay and there were no complications.    Recent vital signs: BP 135/72 (BP Location: Right Arm)   Pulse 66   Temp 97.8 F (36.6 C)   Resp 18   Ht 5\' 9"  (1.753 m)   Wt 57.6 kg   SpO2 99%   BMI 18.75 kg/m    Discharge Medications:    Allergies as of 02/16/2019   No Known Allergies     Medication List    TAKE these medications   diazepam 5 MG tablet Commonly known as: VALIUM Take 1 tablet (5 mg total) by mouth every 6 (six) hours as needed for muscle spasms.   multivitamin with minerals tablet Take 1 tablet by mouth daily.   oxyCODONE-acetaminophen 5-325 MG tablet Commonly known as: PERCOCET/ROXICET Take 1-2 tablets by mouth every 4 (four) hours as needed for moderate pain or severe pain.       Diagnostic Studies: Dg Cervical Spine 1 View  Result Date: 02/15/2019 CLINICAL DATA:  Anterior cervical fusion of C4 through C7. EXAM: DG CERVICAL SPINE - 1 VIEW; DG C-ARM 61-120 MIN FLUOROSCOPY TIME:  14 seconds. COMPARISON:  None. FINDINGS: Single intraoperative fluoroscopic image of the cervical spine demonstrates the patient be status post surgical anterior fusion of C4-5, C5-6 and C6-7. Good alignment of vertebral bodies is noted. IMPRESSION: Fluoroscopic guidance was provided during surgical anterior fusion of lower cervical spine. Electronically Signed   By: Marijo Conception M.D.   On: 02/15/2019 14:06   Dg C-arm 1-60 Min  Result Date: 02/15/2019 CLINICAL DATA:  Anterior cervical fusion of C4 through C7. EXAM: DG CERVICAL SPINE - 1 VIEW; DG C-ARM 61-120 MIN FLUOROSCOPY TIME:  14 seconds. COMPARISON:  None. FINDINGS: Single intraoperative fluoroscopic image of the cervical spine demonstrates the patient be status post surgical anterior fusion of C4-5, C5-6 and C6-7. Good alignment of vertebral bodies is noted. IMPRESSION: Fluoroscopic guidance was provided  during surgical anterior fusion of lower cervical spine. Electronically Signed   By: Lupita RaiderJames  Green Jr M.D.   On: 02/15/2019 14:06    Disposition:    POD #1 s/p ACDF, doing well  -Pain meds escribed to pharmacy  -D/C instructions sheet printed and in chart -D/C today  -F/U in office 2 weeks   Signed: Eilene GhaziKayla J Anthonella Klausner 02/23/2019, 1:25 PM

## 2019-11-06 DIAGNOSIS — Z0000 Encounter for general adult medical examination without abnormal findings: Secondary | ICD-10-CM | POA: Diagnosis not present

## 2019-11-16 ENCOUNTER — Encounter: Payer: Self-pay | Admitting: General Practice

## 2019-12-07 DIAGNOSIS — Z23 Encounter for immunization: Secondary | ICD-10-CM | POA: Diagnosis not present

## 2019-12-07 DIAGNOSIS — Z79899 Other long term (current) drug therapy: Secondary | ICD-10-CM | POA: Diagnosis not present

## 2019-12-07 DIAGNOSIS — Z2821 Immunization not carried out because of patient refusal: Secondary | ICD-10-CM | POA: Diagnosis not present

## 2019-12-07 DIAGNOSIS — W19XXXA Unspecified fall, initial encounter: Secondary | ICD-10-CM | POA: Diagnosis not present

## 2019-12-07 DIAGNOSIS — M2550 Pain in unspecified joint: Secondary | ICD-10-CM | POA: Diagnosis not present

## 2019-12-07 DIAGNOSIS — I10 Essential (primary) hypertension: Secondary | ICD-10-CM | POA: Diagnosis not present

## 2019-12-07 DIAGNOSIS — I679 Cerebrovascular disease, unspecified: Secondary | ICD-10-CM | POA: Diagnosis not present

## 2019-12-07 DIAGNOSIS — Z681 Body mass index (BMI) 19 or less, adult: Secondary | ICD-10-CM | POA: Diagnosis not present

## 2019-12-07 DIAGNOSIS — Z13220 Encounter for screening for lipoid disorders: Secondary | ICD-10-CM | POA: Diagnosis not present

## 2019-12-20 ENCOUNTER — Ambulatory Visit: Payer: Medicare HMO | Admitting: Cardiology

## 2019-12-20 ENCOUNTER — Other Ambulatory Visit: Payer: Self-pay

## 2019-12-20 ENCOUNTER — Encounter: Payer: Self-pay | Admitting: Cardiology

## 2019-12-20 VITALS — BP 136/82 | HR 79 | Temp 97.1°F | Ht 69.0 in | Wt 123.8 lb

## 2019-12-20 DIAGNOSIS — I6522 Occlusion and stenosis of left carotid artery: Secondary | ICD-10-CM

## 2019-12-20 DIAGNOSIS — I10 Essential (primary) hypertension: Secondary | ICD-10-CM | POA: Insufficient documentation

## 2019-12-20 DIAGNOSIS — R079 Chest pain, unspecified: Secondary | ICD-10-CM

## 2019-12-20 DIAGNOSIS — F17210 Nicotine dependence, cigarettes, uncomplicated: Secondary | ICD-10-CM

## 2019-12-20 DIAGNOSIS — R0989 Other specified symptoms and signs involving the circulatory and respiratory systems: Secondary | ICD-10-CM | POA: Diagnosis not present

## 2019-12-20 DIAGNOSIS — R0600 Dyspnea, unspecified: Secondary | ICD-10-CM

## 2019-12-20 DIAGNOSIS — R0602 Shortness of breath: Secondary | ICD-10-CM | POA: Insufficient documentation

## 2019-12-20 DIAGNOSIS — R0609 Other forms of dyspnea: Secondary | ICD-10-CM

## 2019-12-20 DIAGNOSIS — R9431 Abnormal electrocardiogram [ECG] [EKG]: Secondary | ICD-10-CM

## 2019-12-20 HISTORY — DX: Other forms of dyspnea: R06.09

## 2019-12-20 HISTORY — DX: Dyspnea, unspecified: R06.00

## 2019-12-20 HISTORY — DX: Essential (primary) hypertension: I10

## 2019-12-20 HISTORY — DX: Other specified symptoms and signs involving the circulatory and respiratory systems: R09.89

## 2019-12-20 HISTORY — DX: Nicotine dependence, cigarettes, uncomplicated: F17.210

## 2019-12-20 NOTE — Progress Notes (Signed)
Cardiology Office Note:    Date:  12/20/2019   ID:  Tony Munoz, DOB 1956-03-28, MRN [id number]  PCP:  Buckner Malta, MD  Cardiologist:  Garwin Brothers, MD   Referring MD: Buckner Malta, MD    ASSESSMENT:    1. DOE (dyspnea on exertion)   2. Abnormal EKG   3. Essential hypertension   4. Bilateral carotid bruits   5. Cigarette smoker   6. Prominent abdominal aortic pulsation    PLAN:    In order of problems listed above:  1. Primary prevention stressed with the patient.  Importance of compliance with diet medication stressed and he vocalized understanding. 2. Cigarette smoker: I spent 5 minutes with the patient discussing solely about smoking. Smoking cessation was counseled. I suggested to the patient also different medications and pharmacological interventions. Patient is keen to try stopping on its own at this time. He will get back to me if he needs any further assistance in this matter. 3. In view of the patient's history and risk factors and dyspnea on exertion we will set him up for a Lexiscan sestamibi to rule out objective evidence of coronary artery disease. 4. Essential hypertension: Blood pressure stable.  Echocardiogram will be done to assess murmur heard on auscultation. 5. Bilateral carotid bruit: Left is more prominent than the right.  We will get bilateral carotid evaluation.  With Dopplers. 6. Abdominal pulsations are prominent and we will do abnormality in ultrasound in view of history of smoking. 7. Patient will be seen in follow-up appointment in 1 months or earlier if the patient has any concerns    Medication Adjustments/Labs and Tests Ordered: Current medicines are reviewed at length with the patient today.  Concerns regarding medicines are outlined above.  No orders of the defined types were placed in this encounter.  No orders of the defined types were placed in this encounter.    History of Present Illness:    Tony Munoz is a 64  y.o. male who is being seen today for the evaluation of abnormal EKG and dyspnea on exertion at the request of Buckner Malta, MD.  Patient is a pleasant 64 year old male.  He has past medical history of essential hypertension.  He is a heavy cigarette smoker since a very long time.  Patient mentions to me that he is referred here for abnormal EKG.  He has dyspnea on exertion.  He leads a sedentary lifestyle.  No chest pain orthopnea or PND.  At the time of my evaluation, the patient is alert awake oriented and in no distress.  Past Medical History:  Diagnosis Date  . Abnormal EKG   . Radiculopathy 02/15/2019    Past Surgical History:  Procedure Laterality Date  . ANTERIOR CERVICAL DECOMPRESSION/DISCECTOMY FUSION 4 LEVELS Bilateral 02/15/2019   Procedure: ANTERIOR CERVICAL DECOMPRESSION FUSION, CERVICAL FOUR-FIVE, CERVICAL FIVE-SIX, CERVICAL SIX-SEVEN WITH INSTRUMENTATION AND ALLOGRAFT;  Surgeon: Estill Bamberg, MD;  Location: [facility] OR;  Service: Orthopedics;  Laterality: Bilateral;  . EYE SURGERY Bilateral    Cataracts  . HERNIA REPAIR     double hernia - 36yrs ago    Current Medications: Current Meds  Medication Sig  . lisinopril (ZESTRIL) 2.5 MG tablet Take 2.5 mg by mouth daily.  . Multiple Vitamins-Minerals (MULTIVITAMIN WITH MINERALS) tablet Take 1 tablet by mouth daily.  . traMADol (ULTRAM) 50 MG tablet Take 50 mg by mouth every 6 (six) hours as needed.     Allergies:   Patient has no known allergies.  Social History   Socioeconomic History  . Marital status: Single    Spouse name: Not on file  . Number of children: Not on file  . Years of education: Not on file  . Highest education level: Not on file  Occupational History  . Not on file  Tobacco Use  . Smoking status: Current Every Day Smoker    Packs/day: 1.50    Types: Cigarettes  . Smokeless tobacco: Never Used  Substance and Sexual Activity  . Alcohol use: Not on file    Comment: on occassion  . Drug use:  Never  . Sexual activity: Not on file  Other Topics Concern  . Not on file  Social History Narrative  . Not on file   Social Determinants of Health   Financial Resource Strain:   . Difficulty of Paying Living Expenses:   Food Insecurity:   . Worried About Charity fundraiser in the Last Year:   . Arboriculturist in the Last Year:   Transportation Needs:   . Film/video editor (Medical):   [name] Lack of Transportation (Non-Medical):   Physical Activity:   . Days of Exercise per Week:   . Minutes of Exercise per Session:   Stress:   . Feeling of Stress :   Social Connections:   . Frequency of Communication with Friends and Family:   . Frequency of Social Gatherings with Friends and Family:   . Attends Religious Services:   . Active Member of Clubs or Organizations:   . Attends Archivist Meetings:   [name] Marital Status:      Family History: The patient's family history is not on file.  ROS:   Please see the history of present illness.    All other systems reviewed and are negative.  EKGs/Labs/Other Studies Reviewed:    The following studies were reviewed today: EKG reveals sinus rhythm with T wave inversions in anterolateral and inferior leads.   Recent Labs: 02/13/2019: ALT 14; BUN 8; Creatinine, Ser 1.14; Hemoglobin 16.8; Platelets 138; Potassium 3.9; Sodium 138  Recent Lipid Panel No results found for: CHOL, TRIG, HDL, CHOLHDL, VLDL, LDLCALC, LDLDIRECT  Physical Exam:    VS:  BP 136/82   Pulse 79   Temp (!) 97.1 F (36.2 C)   Ht 5\' 9"  (1.753 m)   Wt 123 lb 12.8 oz (56.2 kg)   SpO2 96%   BMI 18.28 kg/m     Wt Readings from Last 3 Encounters:  12/20/19 123 lb 12.8 oz (56.2 kg)  02/15/19 127 lb (57.6 kg)  02/13/19 122 lb 11.2 oz (55.7 kg)     GEN: Patient is in no acute distress HEENT: Normal NECK: No JVD; bilateral carotid bruits LYMPHATICS: No lymphadenopathy CARDIAC: S1 S2 regular, 2/6 systolic murmur at the apex. RESPIRATORY:  Clear to  auscultation without rales, wheezing or rhonchi  ABDOMEN: Soft, non-tender, non-distended abdominal pulsations are prominent. MUSCULOSKELETAL:  No edema; No deformity  SKIN: Warm and dry NEUROLOGIC:  Alert and oriented x 3 PSYCHIATRIC:  Normal affect    Signed, Jenean Lindau, MD  12/20/2019 2:31 PM    [place]

## 2019-12-20 NOTE — Addendum Note (Signed)
Addended by: Eleonore Chiquito on: 12/20/2019 02:51 PM   Modules accepted: Orders

## 2019-12-20 NOTE — Patient Instructions (Signed)
Medication Instructions:  No medication changes *If you need a refill on your cardiac medications before your next appointment, please call your pharmacy*   No labs ordered   Testing/Procedures: Your physician has requested that you have an echocardiogram. Echocardiography is a painless test that uses sound waves to create images of your heart. It provides your doctor with information about the size and shape of your heart and how well your heart's chambers and valves are working. This procedure takes approximately one hour. There are no restrictions for this procedure.  Your physician has requested that you have a carotid duplex. This test is an ultrasound of the carotid arteries in your neck. It looks at blood flow through these arteries that supply the brain with blood. Allow one hour for this exam. There are no restrictions or special instructions.  Your physician has requested that you have an abdominal aorta duplex. During this test, an ultrasound is used to evaluate the aorta. Allow 30 minutes for this exam. Do not eat after midnight the day before and avoid carbonated beverages.  Your physician has requested that you have a lexiscan myoview. For further information please visit [email or web address] Please follow instruction sheet, as given.  The test will take approximately 3 to 4 hours to complete; you may bring reading material.  If someone comes with you to your appointment, they will need to remain in the main lobby due to limited space in the testing area.   How to prepare for your Myocardial Perfusion Test: . Do not eat or drink 3 hours prior to your test, except you may have water. . Do not consume products containing caffeine (regular or decaffeinated) 12 hours prior to your test. (ex: coffee, chocolate, sodas, tea). . Do bring a list of your current medications with you.  If not listed below, you may take your medications as normal. . Do wear comfortable clothes (no dresses or  overalls) and walking shoes, tennis shoes preferred (No heels or open toe shoes are allowed). . Do NOT wear cologne, perfume, aftershave, or lotions (deodorant is allowed). . If these instructions are not followed, your test will have to be rescheduled.    Follow-Up: At [facility], you and your health needs are our priority.  As part of our continuing mission to provide you with exceptional heart care, we have created designated Provider Care Teams.  These Care Teams include your primary Cardiologist (physician) and Advanced Practice Providers (APPs -  Physician Assistants and Nurse Practitioners) who all work together to provide you with the care you need, when you need it.  We recommend signing up for the patient portal called "MyChart".  Sign up information is provided on this After Visit Summary.  MyChart is used to connect with patients for Virtual Visits (Telemedicine).  Patients are able to view lab/test results, encounter notes, upcoming appointments, etc.  Non-urgent messages can be sent to your provider as well.   To learn more about what you can do with MyChart, go to [email or web address].    Your next appointment:   1 month(s)  The format for your next appointment:   In Person  Provider:   Belva Crome, MD   Other Instructions Ultrasound Instructions  Carotid/Echocardiogram/Thyroid: Wear a wide neck shirt, you may have to remove top for ultrasound  BE AWARE OF ULTRASOUND POLICY FEE, IF APPOINTMENT NEEDS TO BE CANCELLED OR RESCHEDULED DO SO WITHIN 48 HOURS OF SCHEDULED APPOINTMENT TO AVOID THE $100.00 FEE Echocardiogram An echocardiogram is a procedure that  uses painless sound waves (ultrasound) to produce an image of the heart. Images from an echocardiogram can provide important information about:  Signs of coronary artery disease (CAD).  Aneurysm detection. An aneurysm is a weak or damaged part of an artery wall that bulges out from the normal force of blood  pumping through the body.  Heart size and shape. Changes in the size or shape of the heart can be associated with certain conditions, including heart failure, aneurysm, and CAD.  Heart muscle function.  Heart valve function.  Signs of a past heart attack.  Fluid buildup around the heart.  Thickening of the heart muscle.  A tumor or infectious growth around the heart valves. Tell a health care provider about:  Any allergies you have.  All medicines you are taking, including vitamins, herbs, eye drops, creams, and over-the-counter medicines.  Any blood disorders you have.  Any surgeries you have had.  Any medical conditions you have.  Whether you are pregnant or may be pregnant. What are the risks? Generally, this is a safe procedure. However, problems may occur, including:  Allergic reaction to dye (contrast) that may be used during the procedure. What happens before the procedure? No specific preparation is needed. You may eat and drink normally. What happens during the procedure?   An IV tube may be inserted into one of your veins.  You may receive contrast through this tube. A contrast is an injection that improves the quality of the pictures from your heart.  A gel will be applied to your chest.  A wand-like tool (transducer) will be moved over your chest. The gel will help to transmit the sound waves from the transducer.  The sound waves will harmlessly bounce off of your heart to allow the heart images to be captured in real-time motion. The images will be recorded on a computer. The procedure may vary among health care providers and hospitals. What happens after the procedure?  You may return to your normal, everyday life, including diet, activities, and medicines, unless your health care provider tells you not to do that. Summary  An echocardiogram is a procedure that uses painless sound waves (ultrasound) to produce an image of the heart.  Images from an  echocardiogram can provide important information about the size and shape of your heart, heart muscle function, heart valve function, and fluid buildup around your heart.  You do not need to do anything to prepare before this procedure. You may eat and drink normally.  After the echocardiogram is completed, you may return to your normal, everyday life, unless your health care provider tells you not to do that. This information is not intended to replace advice given to you by your health care provider. Make sure you discuss any questions you have with your health care provider. Document Revised: 12/15/2018 Document Reviewed: 09/26/2016 Elsevier Patient Education  [place].

## 2019-12-22 DIAGNOSIS — Z716 Tobacco abuse counseling: Secondary | ICD-10-CM | POA: Diagnosis not present

## 2019-12-22 DIAGNOSIS — M2550 Pain in unspecified joint: Secondary | ICD-10-CM | POA: Diagnosis not present

## 2019-12-22 DIAGNOSIS — Z7289 Other problems related to lifestyle: Secondary | ICD-10-CM | POA: Diagnosis not present

## 2019-12-22 DIAGNOSIS — R760 Raised antibody titer: Secondary | ICD-10-CM | POA: Diagnosis not present

## 2019-12-22 DIAGNOSIS — Z681 Body mass index (BMI) 19 or less, adult: Secondary | ICD-10-CM | POA: Diagnosis not present

## 2019-12-22 DIAGNOSIS — I10 Essential (primary) hypertension: Secondary | ICD-10-CM | POA: Diagnosis not present

## 2019-12-27 ENCOUNTER — Telehealth: Payer: Self-pay | Admitting: *Deleted

## 2019-12-27 NOTE — Telephone Encounter (Signed)
Left message on voicemail per DPR in reference to upcoming appointment scheduled on 01/02/2020 at 1100 with detailed instructions given per Myocardial Perfusion Study Information Sheet for the test. LM to arrive 15 minutes early, and that it is imperative to arrive on time for appointment to keep from having the test rescheduled. If you need to cancel or reschedule your appointment, please call the office within 24 hours of your appointment. Failure to do so may result in a cancellation of your appointment, and a $50 no show fee. Phone number given for call back for any questions.   No mychart available.Cheyane Ayon, Adelene Idler

## 2020-01-02 ENCOUNTER — Other Ambulatory Visit: Payer: Self-pay

## 2020-01-02 ENCOUNTER — Ambulatory Visit (INDEPENDENT_AMBULATORY_CARE_PROVIDER_SITE_OTHER): Payer: Medicare HMO

## 2020-01-02 VITALS — Ht 69.0 in | Wt 123.0 lb

## 2020-01-02 DIAGNOSIS — R9431 Abnormal electrocardiogram [ECG] [EKG]: Secondary | ICD-10-CM

## 2020-01-02 DIAGNOSIS — R0989 Other specified symptoms and signs involving the circulatory and respiratory systems: Secondary | ICD-10-CM

## 2020-01-02 DIAGNOSIS — R079 Chest pain, unspecified: Secondary | ICD-10-CM

## 2020-01-02 DIAGNOSIS — R0600 Dyspnea, unspecified: Secondary | ICD-10-CM

## 2020-01-02 DIAGNOSIS — R0609 Other forms of dyspnea: Secondary | ICD-10-CM

## 2020-01-02 LAB — MYOCARDIAL PERFUSION IMAGING
LV dias vol: 118 mL (ref 62–150)
LV sys vol: 59 mL
Peak HR: 93 {beats}/min
Rest HR: 66 {beats}/min
SDS: 2
SRS: 5
SSS: 7
TID: 1.02

## 2020-01-02 MED ORDER — TECHNETIUM TC 99M TETROFOSMIN IV KIT
10.8000 | PACK | Freq: Once | INTRAVENOUS | Status: AC | PRN
  Administered 2020-01-02: 10.8 via INTRAVENOUS

## 2020-01-02 MED ORDER — TECHNETIUM TC 99M TETROFOSMIN IV KIT
30.0000 | PACK | Freq: Once | INTRAVENOUS | Status: AC | PRN
  Administered 2020-01-02: 30 via INTRAVENOUS

## 2020-01-02 MED ORDER — REGADENOSON 0.4 MG/5ML IV SOLN
0.4000 mg | Freq: Once | INTRAVENOUS | Status: AC
  Administered 2020-01-02: 0.4 mg via INTRAVENOUS

## 2020-01-03 ENCOUNTER — Telehealth: Payer: Self-pay | Admitting: Cardiology

## 2020-01-03 MED ORDER — NITROGLYCERIN 0.4 MG SL SUBL: 0.4000 mg | SUBLINGUAL_TABLET | SUBLINGUAL | 3 refills | Status: DC | PRN

## 2020-01-03 NOTE — Telephone Encounter (Signed)
     I went in pt's chart to see who had called him today(01-03-20)

## 2020-01-03 NOTE — Telephone Encounter (Signed)
Follow up:      Pt calling you back, concerning his test results.

## 2020-01-03 NOTE — Telephone Encounter (Signed)
Spoke to patient and he verified with me that he is taking aspirin daily. He did not have a prescription for nitroglycerin however, so this was sent in to the pharmacy for him. He is also now scheduled to see Dr. Tomie China on 01/09/20 to discuss these stress test results in further detail. No other issues or concerns were noted at this time.    Encouraged patient to call back with any questions or concerns.

## 2020-01-08 ENCOUNTER — Other Ambulatory Visit: Payer: Self-pay

## 2020-01-09 ENCOUNTER — Ambulatory Visit: Payer: Medicare HMO | Admitting: Cardiology

## 2020-01-09 ENCOUNTER — Encounter: Payer: Self-pay | Admitting: Cardiology

## 2020-01-09 ENCOUNTER — Other Ambulatory Visit: Payer: Self-pay

## 2020-01-09 VITALS — BP 156/98 | HR 96 | Ht 69.0 in | Wt 122.0 lb

## 2020-01-09 DIAGNOSIS — R0600 Dyspnea, unspecified: Secondary | ICD-10-CM | POA: Diagnosis not present

## 2020-01-09 DIAGNOSIS — R9439 Abnormal result of other cardiovascular function study: Secondary | ICD-10-CM | POA: Diagnosis not present

## 2020-01-09 DIAGNOSIS — R0609 Other forms of dyspnea: Secondary | ICD-10-CM

## 2020-01-09 DIAGNOSIS — I10 Essential (primary) hypertension: Secondary | ICD-10-CM | POA: Diagnosis not present

## 2020-01-09 DIAGNOSIS — F17210 Nicotine dependence, cigarettes, uncomplicated: Secondary | ICD-10-CM | POA: Diagnosis not present

## 2020-01-09 MED ORDER — METOPROLOL SUCCINATE ER 50 MG PO TB24
50.0000 mg | ORAL_TABLET | Freq: Every day | ORAL | 3 refills | Status: DC
Start: 2020-01-09 — End: 2020-03-04

## 2020-01-09 NOTE — H&P (View-Only) (Signed)
Cardiology Office Note:    Date:  01/09/2020   ID:  Tony Munoz, DOB [birth year], MRN [id number]  PCP:  Buckner Malta, MD  Cardiologist:  Garwin Brothers, MD   Referring MD: Buckner Malta, MD    ASSESSMENT:    1. Essential hypertension   2. Abnormal nuclear stress test   3. Cigarette smoker   4. DOE (dyspnea on exertion)    PLAN:    In order of problems listed above:  1. Dyspnea on exertion: Abnormal nuclear stress test: I discussed my findings with the patient at extensive length.  His symptoms are worrisome.  He has multiple risk factors for coronary artery disease and unfortunately continues to smoke.  In view of this I gave him an option of CT coronary angiography and invasive coronary angiography and he opts for the latter.I discussed coronary angiography and left heart catheterization with the patient at extensive length. Procedure, benefits and potential risks were explained. Patient had multiple questions which were answered to the patient's satisfaction. Patient agreed and consented for the procedure. Further recommendations will be made based on the findings of the coronary angiography. In the interim. The patient has any significant symptoms he knows to go to the nearest emergency room. 2. Essential hypertension: Blood pressure is elevated.  This might be from a little anxiety from the results of the stress.  At any rate have started him on metoprolol succinate 50 mg daily.  He will continue to use aspirin and sublingual nitroglycerin as directed. 3. Cigarette smoking: I discussed with this with him and encouraged him to quit and he promises to do so.  Further recommendations will be based based on the findings of the coronary angiography.  Patient had multiple questions which were answered to satisfaction.   Medication Adjustments/Labs and Tests Ordered: Current medicines are reviewed at length with the patient today.  Concerns regarding medicines are outlined above.    No orders of the defined types were placed in this encounter.  No orders of the defined types were placed in this encounter.    Chief Complaint  Patient presents with  . Discuss Stress Test     History of Present Illness:    Tony Munoz is a 64 y.o. male.  Patient has past medical history of essential hypertension and was evaluated for shortness of breath on exertion and abnormal EKG.  His shortness of breath was getting progressively worse.  He is an active smoker and smokes heavily for a long.  Of time.  His stress test came out abnormal and the details are mentioned below.  For this reason he is here for follow-up.  At the time of my evaluation, the patient is alert awake oriented and in no distress.  Past Medical History:  Diagnosis Date  . Abnormal EKG   . Bilateral carotid bruits 12/20/2019  . Cigarette smoker 12/20/2019  . DOE (dyspnea on exertion) 12/20/2019  . Essential hypertension 12/20/2019  . Prominent abdominal aortic pulsation 12/20/2019  . Radiculopathy 02/15/2019    Past Surgical History:  Procedure Laterality Date  . ANTERIOR CERVICAL DECOMPRESSION/DISCECTOMY FUSION 4 LEVELS Bilateral 02/15/2019   Procedure: ANTERIOR CERVICAL DECOMPRESSION FUSION, CERVICAL FOUR-FIVE, CERVICAL FIVE-SIX, CERVICAL SIX-SEVEN WITH INSTRUMENTATION AND ALLOGRAFT;  Surgeon: Estill Bamberg, MD;  Location: [facility] OR;  Service: Orthopedics;  Laterality: Bilateral;  . EYE SURGERY Bilateral    Cataracts  . HERNIA REPAIR     double hernia - 38yrs ago    Current Medications: Current Meds  Medication Sig  .  lisinopril (ZESTRIL) 2.5 MG tablet Take 2.5 mg by mouth daily.  . Multiple Vitamins-Minerals (MULTIVITAMIN WITH MINERALS) tablet Take 1 tablet by mouth daily.  . nitroGLYCERIN (NITROSTAT) 0.4 MG SL tablet Place 1 tablet (0.4 mg total) under the tongue every 5 (five) minutes as needed for chest pain.  . tizanidine (ZANAFLEX) 2 MG capsule Take 2 mg by mouth daily as needed.  [name] tiZANidine  (ZANAFLEX) 4 MG tablet Take 4 mg by mouth 2 (two) times daily as needed.  . traMADol (ULTRAM) 50 MG tablet Take 50 mg by mouth every 6 (six) hours as needed.     Allergies:   Patient has no known allergies.   Social History   Socioeconomic History  . Marital status: Single    Spouse name: Not on file  . Number of children: Not on file  . Years of education: Not on file  . Highest education level: Not on file  Occupational History  . Not on file  Tobacco Use  . Smoking status: Current Every Day Smoker    Packs/day: 1.50    Types: Cigarettes  . Smokeless tobacco: Never Used  Substance and Sexual Activity  . Alcohol use: Not on file    Comment: on occassion  . Drug use: Never  . Sexual activity: Not on file  Other Topics Concern  . Not on file  Social History Narrative  . Not on file   Social Determinants of Health   Financial Resource Strain:   . Difficulty of Paying Living Expenses:   Food Insecurity:   . Worried About Charity fundraiser in the Last Year:   . Arboriculturist in the Last Year:   Transportation Needs:   . Film/video editor (Medical):   [name] Lack of Transportation (Non-Medical):   Physical Activity:   . Days of Exercise per Week:   . Minutes of Exercise per Session:   Stress:   . Feeling of Stress :   Social Connections:   . Frequency of Communication with Friends and Family:   . Frequency of Social Gatherings with Friends and Family:   . Attends Religious Services:   . Active Member of Clubs or Organizations:   . Attends Archivist Meetings:   [name] Marital Status:      Family History: The patient's family history is not on file.  ROS:   Please see the history of present illness.    All other systems reviewed and are negative.  EKGs/Labs/Other Studies Reviewed:    The following studies were reviewed today: Study Highlights   The left ventricular ejection fraction is mildly decreased (45-54%).  Nuclear stress EF:  50%.  There was no ST segment deviation noted during stress.  No T wave inversion was noted during stress.  Defect 1: There is a fixed medium defect of moderate severity present in the basal inferior location, with partial reversibility. There is mild hypokinesis noted in the basal inferior wall.  Findings consistent with prior myocardial infarction with peri-infarct ischemia.  This is an intermediate risk study.        Recent Labs: 02/13/2019: ALT 14; BUN 8; Creatinine, Ser 1.14; Hemoglobin 16.8; Platelets 138; Potassium 3.9; Sodium 138  Recent Lipid Panel No results found for: CHOL, TRIG, HDL, CHOLHDL, VLDL, LDLCALC, LDLDIRECT  Physical Exam:    VS:  BP (!) 156/98   Pulse 96   Ht 5\' 9"  (1.753 m)   Wt 122 lb (55.3 kg)   SpO2 97%  BMI 18.02 kg/m     Wt Readings from Last 3 Encounters:  01/09/20 122 lb (55.3 kg)  01/02/20 123 lb (55.8 kg)  12/20/19 123 lb 12.8 oz (56.2 kg)     GEN: Patient is in no acute distress HEENT: Normal NECK: No JVD; No carotid bruits LYMPHATICS: No lymphadenopathy CARDIAC: Hear sounds regular, 2/6 systolic murmur at the apex. RESPIRATORY:  Clear to auscultation without rales, wheezing or rhonchi  ABDOMEN: Soft, non-tender, non-distended MUSCULOSKELETAL:  No edema; No deformity  SKIN: Warm and dry NEUROLOGIC:  Alert and oriented x 3 PSYCHIATRIC:  Normal affect   Signed, Garwin Brothers, MD  01/09/2020 1:46 PM    [facility]

## 2020-01-09 NOTE — Progress Notes (Signed)
Cardiology Office Note:    Date:  01/09/2020   ID:  Tony Munoz, DOB 11/27/1955, MRN [id number]  PCP:  Burgart, Jennifer, MD  Cardiologist:  Tony Vitelli R Yatziry Deakins, MD   Referring MD: Burgart, Jennifer, MD    ASSESSMENT:    1. Essential hypertension   2. Abnormal nuclear stress test   3. Cigarette smoker   4. DOE (dyspnea on exertion)    PLAN:    In order of problems listed above:  1. Dyspnea on exertion: Abnormal nuclear stress test: I discussed my findings with the patient at extensive length.  His symptoms are worrisome.  He has multiple risk factors for coronary artery disease and unfortunately continues to smoke.  In view of this I gave him an option of CT coronary angiography and invasive coronary angiography and he opts for the latter.I discussed coronary angiography and left heart catheterization with the patient at extensive length. Procedure, benefits and potential risks were explained. Patient had multiple questions which were answered to the patient's satisfaction. Patient agreed and consented for the procedure. Further recommendations will be made based on the findings of the coronary angiography. In the interim. The patient has any significant symptoms he knows to go to the nearest emergency room. 2. Essential hypertension: Blood pressure is elevated.  This might be from a little anxiety from the results of the stress.  At any rate have started him on metoprolol succinate 50 mg daily.  He will continue to use aspirin and sublingual nitroglycerin as directed. 3. Cigarette smoking: I discussed with this with him and encouraged him to quit and he promises to do so.  Further recommendations will be based based on the findings of the coronary angiography.  Patient had multiple questions which were answered to satisfaction.   Medication Adjustments/Labs and Tests Ordered: Current medicines are reviewed at length with the patient today.  Concerns regarding medicines are outlined above.    No orders of the defined types were placed in this encounter.  No orders of the defined types were placed in this encounter.    Chief Complaint  Patient presents with  . Discuss Stress Test     History of Present Illness:    Tony Munoz is a 63 y.o. male.  Patient has past medical history of essential hypertension and was evaluated for shortness of breath on exertion and abnormal EKG.  His shortness of breath was getting progressively worse.  He is an active smoker and smokes heavily for a long.  Of time.  His stress test came out abnormal and the details are mentioned below.  For this reason he is here for follow-up.  At the time of my evaluation, the patient is alert awake oriented and in no distress.  Past Medical History:  Diagnosis Date  . Abnormal EKG   . Bilateral carotid bruits 12/20/2019  . Cigarette smoker 12/20/2019  . DOE (dyspnea on exertion) 12/20/2019  . Essential hypertension 12/20/2019  . Prominent abdominal aortic pulsation 12/20/2019  . Radiculopathy 02/15/2019    Past Surgical History:  Procedure Laterality Date  . ANTERIOR CERVICAL DECOMPRESSION/DISCECTOMY FUSION 4 LEVELS Bilateral 02/15/2019   Procedure: ANTERIOR CERVICAL DECOMPRESSION FUSION, CERVICAL FOUR-FIVE, CERVICAL FIVE-SIX, CERVICAL SIX-SEVEN WITH INSTRUMENTATION AND ALLOGRAFT;  Surgeon: Dumonski, Mark, MD;  Location: [facility] OR;  Service: Orthopedics;  Laterality: Bilateral;  . EYE SURGERY Bilateral    Cataracts  . HERNIA REPAIR     double hernia - 40yrs ago    Current Medications: Current Meds  Medication Sig  .   lisinopril (ZESTRIL) 2.5 MG tablet Take 2.5 mg by mouth daily.  . Multiple Vitamins-Minerals (MULTIVITAMIN WITH MINERALS) tablet Take 1 tablet by mouth daily.  . nitroGLYCERIN (NITROSTAT) 0.4 MG SL tablet Place 1 tablet (0.4 mg total) under the tongue every 5 (five) minutes as needed for chest pain.  . tizanidine (ZANAFLEX) 2 MG capsule Take 2 mg by mouth daily as needed.  [name] tiZANidine  (ZANAFLEX) 4 MG tablet Take 4 mg by mouth 2 (two) times daily as needed.  . traMADol (ULTRAM) 50 MG tablet Take 50 mg by mouth every 6 (six) hours as needed.     Allergies:   Patient has no known allergies.   Social History   Socioeconomic History  . Marital status: Single    Spouse name: Not on file  . Number of children: Not on file  . Years of education: Not on file  . Highest education level: Not on file  Occupational History  . Not on file  Tobacco Use  . Smoking status: Current Every Day Smoker    Packs/day: 1.50    Types: Cigarettes  . Smokeless tobacco: Never Used  Substance and Sexual Activity  . Alcohol use: Not on file    Comment: on occassion  . Drug use: Never  . Sexual activity: Not on file  Other Topics Concern  . Not on file  Social History Narrative  . Not on file   Social Determinants of Health   Financial Resource Strain:   . Difficulty of Paying Living Expenses:   Food Insecurity:   . Worried About Charity fundraiser in the Last Year:   . Arboriculturist in the Last Year:   Transportation Needs:   . Film/video editor (Medical):   [name] Lack of Transportation (Non-Medical):   Physical Activity:   . Days of Exercise per Week:   . Minutes of Exercise per Session:   Stress:   . Feeling of Stress :   Social Connections:   . Frequency of Communication with Friends and Family:   . Frequency of Social Gatherings with Friends and Family:   . Attends Religious Services:   . Active Member of Clubs or Organizations:   . Attends Archivist Meetings:   [name] Marital Status:      Family History: The patient's family history is not on file.  ROS:   Please see the history of present illness.    All other systems reviewed and are negative.  EKGs/Labs/Other Studies Reviewed:    The following studies were reviewed today: Study Highlights   The left ventricular ejection fraction is mildly decreased (45-54%).  Nuclear stress EF:  50%.  There was no ST segment deviation noted during stress.  No T wave inversion was noted during stress.  Defect 1: There is a fixed medium defect of moderate severity present in the basal inferior location, with partial reversibility. There is mild hypokinesis noted in the basal inferior wall.  Findings consistent with prior myocardial infarction with peri-infarct ischemia.  This is an intermediate risk study.        Recent Labs: 02/13/2019: ALT 14; BUN 8; Creatinine, Ser 1.14; Hemoglobin 16.8; Platelets 138; Potassium 3.9; Sodium 138  Recent Lipid Panel No results found for: CHOL, TRIG, HDL, CHOLHDL, VLDL, LDLCALC, LDLDIRECT  Physical Exam:    VS:  BP (!) 156/98   Pulse 96   Ht 5\' 9"  (1.753 m)   Wt 122 lb (55.3 kg)   SpO2 97%  BMI 18.02 kg/m     Wt Readings from Last 3 Encounters:  01/09/20 122 lb (55.3 kg)  01/02/20 123 lb (55.8 kg)  12/20/19 123 lb 12.8 oz (56.2 kg)     GEN: Patient is in no acute distress HEENT: Normal NECK: No JVD; No carotid bruits LYMPHATICS: No lymphadenopathy CARDIAC: Hear sounds regular, 2/6 systolic murmur at the apex. RESPIRATORY:  Clear to auscultation without rales, wheezing or rhonchi  ABDOMEN: Soft, non-tender, non-distended MUSCULOSKELETAL:  No edema; No deformity  SKIN: Warm and dry NEUROLOGIC:  Alert and oriented x 3 PSYCHIATRIC:  Normal affect   Signed, Garwin Brothers, MD  01/09/2020 1:46 PM    [facility]

## 2020-01-09 NOTE — Patient Instructions (Signed)
Medication Instructions:  Your physician has recommended you make the following change in your medication:   Start Metoprolol succinate 50 mg daily.  *If you need a refill on your cardiac medications before your next appointment, please call your pharmacy*   Lab Work: Your physician recommends that you have a cbc and bmet today in the office.  If you have labs (blood work) drawn today and your tests are completely normal, you will receive your results only by: [name] MyChart Message (if you have MyChart) OR . A paper copy in the mail If you have any lab test that is abnormal or we need to change your treatment, we will call you to review the results.   Testing/Procedures:    [facility] [address] [place] [address] Dept: [phone] Loc: [phone]  Jaimes Eckert  01/09/2020  You are scheduled for a Cardiac Catheterization on Friday, May 14 with Dr. Verdis Prime.  1. Please arrive at the [facility] (Main Entrance A) at [facility]: [address] [place], [place] [address] at 5:30 AM (This time is two hours before your procedure to ensure your preparation). Free valet parking service is available.   Special note: Every effort is made to have your procedure done on time. Please understand that emergencies sometimes delay scheduled procedures.  2. Diet: Do not eat solid foods after midnight.  The patient may have clear liquids until 5am upon the day of the procedure.  3. Labs: You had pre-cath labs done in the office.   Contrast Allergy: No  Stop taking, Lisinopril (Zestril or Prinivil) Friday, May 14,   On the morning of your procedure, take your Aspirin and any morning medicines NOT listed above.  You may use sips of water.  5. Plan for one night stay--bring personal belongings. 6. Bring a current list of your medications and current insurance cards. 7. You MUST have a  responsible person to drive you home. 8. Someone MUST be with you the first 24 hours after you arrive home or your discharge will be delayed. 9. Please wear clothes that are easy to get on and off and wear slip-on shoes.  Thank you for allowing [place] to care for you!   -- [facility]    Follow-Up: At [facility], you and your health needs are our priority.  As part of our continuing mission to provide you with exceptional heart care, we have created designated Provider Care Teams.  These Care Teams include your primary Cardiologist (physician) and Advanced Practice Providers (APPs -  Physician Assistants and Nurse Practitioners) who all work together to provide you with the care you need, when you need it.  We recommend signing up for the patient portal called "MyChart".  Sign up information is provided on this After Visit Summary.  MyChart is used to connect with patients for Virtual Visits (Telemedicine).  Patients are able to view lab/test results, encounter notes, upcoming appointments, etc.  Non-urgent messages can be sent to your provider as well.   To learn more about what you can do with MyChart, go to [email or web address].    Your next appointment:   1 month(s)  The format for your next appointment:   In Person  Provider:   Belva Crome, MD   Other Instructions NA

## 2020-01-10 ENCOUNTER — Telehealth: Payer: Self-pay | Admitting: Cardiology

## 2020-01-10 LAB — CBC WITH DIFFERENTIAL/PLATELET
Basophils Absolute: 0 10*3/uL (ref 0.0–0.2)
Basos: 1 %
EOS (ABSOLUTE): 0 10*3/uL (ref 0.0–0.4)
Eos: 1 %
Hematocrit: 51.3 % — ABNORMAL HIGH (ref 37.5–51.0)
Hemoglobin: 17.8 g/dL — ABNORMAL HIGH (ref 13.0–17.7)
Immature Grans (Abs): 0 10*3/uL (ref 0.0–0.1)
Immature Granulocytes: 0 %
Lymphocytes Absolute: 1.2 10*3/uL (ref 0.7–3.1)
Lymphs: 23 %
MCH: 33.1 pg — ABNORMAL HIGH (ref 26.6–33.0)
MCHC: 34.7 g/dL (ref 31.5–35.7)
MCV: 95 fL (ref 79–97)
Monocytes Absolute: 0.4 10*3/uL (ref 0.1–0.9)
Monocytes: 8 %
Neutrophils Absolute: 3.6 10*3/uL (ref 1.4–7.0)
Neutrophils: 67 %
Platelets: 132 10*3/uL — ABNORMAL LOW (ref 150–450)
RBC: 5.38 x10E6/uL (ref 4.14–5.80)
RDW: 12.4 % (ref 11.6–15.4)
WBC: 5.3 10*3/uL (ref 3.4–10.8)

## 2020-01-10 LAB — BASIC METABOLIC PANEL
BUN/Creatinine Ratio: 7 — ABNORMAL LOW (ref 10–24)
BUN: 7 mg/dL — ABNORMAL LOW (ref 8–27)
CO2: 24 mmol/L (ref 20–29)
Calcium: 8.9 mg/dL (ref 8.6–10.2)
Chloride: 103 mmol/L (ref 96–106)
Creatinine, Ser: 1.02 mg/dL (ref 0.76–1.27)
GFR calc Af Amer: 89 mL/min/{1.73_m2} (ref >59)
GFR calc non Af Amer: 77 mL/min/{1.73_m2} (ref >59)
Glucose: 85 mg/dL (ref 65–99)
Potassium: 3.6 mmol/L (ref 3.5–5.2)
Sodium: 146 mmol/L — ABNORMAL HIGH (ref 134–144)

## 2020-01-10 NOTE — Telephone Encounter (Signed)
01/19/20 appointment cancelled due to cath.

## 2020-01-10 NOTE — Telephone Encounter (Signed)
Patient states he is calling to ensure that appointment scheduled for 01/19/20 with Dr. Tomie China is necessary. Please call.

## 2020-01-15 ENCOUNTER — Ambulatory Visit (INDEPENDENT_AMBULATORY_CARE_PROVIDER_SITE_OTHER): Payer: Medicare HMO

## 2020-01-15 ENCOUNTER — Other Ambulatory Visit: Payer: Self-pay

## 2020-01-15 DIAGNOSIS — R0600 Dyspnea, unspecified: Secondary | ICD-10-CM | POA: Diagnosis not present

## 2020-01-15 DIAGNOSIS — R0989 Other specified symptoms and signs involving the circulatory and respiratory systems: Secondary | ICD-10-CM

## 2020-01-15 NOTE — Progress Notes (Signed)
Complete echocardiogram has been performed.  Jimmy Danyell Awbrey RDCS, RVT 

## 2020-01-15 NOTE — Progress Notes (Signed)
Carotid duplex exam performed  Jimmy Chera Slivka RDCS, RVT 

## 2020-01-15 NOTE — Progress Notes (Signed)
Abdominal aortic duplex exam performed.  Jimmy Gitty Osterlund RDCS, RVT 

## 2020-01-15 NOTE — Addendum Note (Signed)
Addended by: Eleonore Chiquito on: 01/15/2020 04:06 PM   Modules accepted: Orders

## 2020-01-17 ENCOUNTER — Encounter: Payer: Self-pay | Admitting: Cardiology

## 2020-01-17 ENCOUNTER — Telehealth (HOSPITAL_COMMUNITY): Payer: Self-pay

## 2020-01-17 ENCOUNTER — Other Ambulatory Visit (HOSPITAL_COMMUNITY): Payer: Medicare HMO

## 2020-01-17 ENCOUNTER — Telehealth: Payer: Self-pay | Admitting: Cardiology

## 2020-01-17 NOTE — H&P (Signed)
   Per Dr. Henrietta Hoover, patient has tight left carotid stenosis.

## 2020-01-17 NOTE — Telephone Encounter (Signed)

## 2020-01-17 NOTE — Telephone Encounter (Signed)
   Casimer Bilis from [facility] calling, she said pt came to their office for his covid test. Pt looks flustered and she said they can do covid test in their office. Advised pt can go across the street to get covid test but pt doesn't want to go anywhere else. Gave fax for Dr. Tomie China to send result

## 2020-01-17 NOTE — Telephone Encounter (Signed)
S31.594 code for prescreen COVID. Results will be faxed to [place] in the morning.

## 2020-01-17 NOTE — Telephone Encounter (Signed)
Follow up  Tony Munoz is calling back to get diagnosis code for the covid test

## 2020-01-18 ENCOUNTER — Other Ambulatory Visit: Payer: Self-pay

## 2020-01-18 ENCOUNTER — Ambulatory Visit (HOSPITAL_COMMUNITY)
Admission: RE | Admit: 2020-01-18 | Discharge: 2020-01-18 | Disposition: A | Discharge status: Home / Self Care | Payer: Medicare HMO | Source: Ambulatory Visit | Attending: Vascular Surgery | Admitting: Vascular Surgery

## 2020-01-18 ENCOUNTER — Ambulatory Visit (INDEPENDENT_AMBULATORY_CARE_PROVIDER_SITE_OTHER): Payer: Medicare HMO | Admitting: Vascular Surgery

## 2020-01-18 ENCOUNTER — Telehealth: Payer: Self-pay | Admitting: *Deleted

## 2020-01-18 ENCOUNTER — Encounter: Payer: Self-pay | Admitting: Vascular Surgery

## 2020-01-18 VITALS — BP 146/92 | HR 74 | Temp 97.5°F | Resp 20 | Ht 69.0 in | Wt 122.0 lb

## 2020-01-18 DIAGNOSIS — I6529 Occlusion and stenosis of unspecified carotid artery: Secondary | ICD-10-CM | POA: Diagnosis not present

## 2020-01-18 DIAGNOSIS — I6523 Occlusion and stenosis of bilateral carotid arteries: Secondary | ICD-10-CM | POA: Diagnosis not present

## 2020-01-18 NOTE — Telephone Encounter (Signed)
Pt contacted pre-catheterization scheduled at [facility] for: Friday Jan 19, 2020 7:30 AM Verified arrival time and place: [facility] Main Entrance A [facility]) at: 5:30 AM   No solid food after midnight prior to cath, clear liquids until 5 AM day of procedure.  AM meds can be  taken pre-cath with sip of water including: ASA 81 mg   Confirmed patient has responsible adult to drive home post procedure and observe 24 hours after arriving home: yes  You are allowed ONE visitor in the waiting room during your procedures. Both you and your visitor must wear masks.      COVID-19 Pre-Screening Questions:  . In the past 7 to 10 days have you had a cough,  shortness of breath, headache, congestion, fever (100 or greater) body aches, chills, sore throat, or sudden loss of taste or sense of smell? Shortness of breath, not new . Have you been around anyone with known Covid 19 in the past 7 to 10 days? no . Have you been around anyone who is awaiting Covid 19 test results in the past 7 to 10 days? no . Have you been around anyone who has mentioned symptoms of Covid 19 within the past 7 to 10 days? no   Reviewed procedure/mask/visitor instructions, COVID-19 screening questions with patient.

## 2020-01-18 NOTE — Progress Notes (Signed)
Referring Physician: Dr Josiah Lobo  Patient name: Tony Munoz MRN: [id number] DOB: 1956/04/03 Sex: male  REASON FOR CONSULT: Greater than 80% left internal carotid artery stenosis with contralateral occlusion  HPI: Tony Munoz is a 64 y.o. male, who was recently found to have greater than 80% left internal carotid artery stenosis with contralateral occlusion.  Duplex was done for asymptomatic bruit.  However, on questioning the patient today he states that he may have had a head CT in the past in [place] that might have showed an old stroke.  The patient is apparently scheduled for cardiac catheterization for tomorrow based on recent positive stress test.  He states overall he is fairly active and walks every day.  He has been a very heavy smoker in the past up to about 4 packs a day but is down to about a half a pack per day now and is trying to quit.  He has not really describe any episodes that sound like TIA amaurosis or stroke.  He had a multilevel C-spine fusion by Dr. Yevette Edwards a few months ago and had no significant anesthesia complications.  Other medical problems include hypertension which has been controlled.  He is currently on aspirin.  He is not currently on a statin.  Past Medical History:  Diagnosis Date  . Abnormal EKG   . Bilateral carotid bruits 12/20/2019  . Carotid artery occlusion   . Cigarette smoker 12/20/2019  . DOE (dyspnea on exertion) 12/20/2019  . Essential hypertension 12/20/2019  . Prominent abdominal aortic pulsation 12/20/2019  . Radiculopathy 02/15/2019   Past Surgical History:  Procedure Laterality Date  . ANTERIOR CERVICAL DECOMPRESSION/DISCECTOMY FUSION 4 LEVELS Bilateral 02/15/2019   Procedure: ANTERIOR CERVICAL DECOMPRESSION FUSION, CERVICAL FOUR-FIVE, CERVICAL FIVE-SIX, CERVICAL SIX-SEVEN WITH INSTRUMENTATION AND ALLOGRAFT;  Surgeon: Estill Bamberg, MD;  Location: [facility] OR;  Service: Orthopedics;  Laterality: Bilateral;  . EYE SURGERY Bilateral     Cataracts  . HERNIA REPAIR     double hernia - 52yrs ago    History reviewed. No pertinent family history.  SOCIAL HISTORY: Social History   Socioeconomic History  . Marital status: Single    Spouse name: Not on file  . Number of children: Not on file  . Years of education: Not on file  . Highest education level: Not on file  Occupational History  . Not on file  Tobacco Use  . Smoking status: Current Every Day Smoker    Packs/day: 1.50    Types: Cigarettes  . Smokeless tobacco: Never Used  Substance and Sexual Activity  . Alcohol use: Yes    Comment: on occassion  . Drug use: Never  . Sexual activity: Not on file  Other Topics Concern  . Not on file  Social History Narrative  . Not on file   Social Determinants of Health   Financial Resource Strain:   . Difficulty of Paying Living Expenses:   Food Insecurity:   . Worried About Programme researcher, broadcasting/film/video in the Last Year:   . Barista in the Last Year:   Transportation Needs:   . Freight forwarder (Medical):   [name] Lack of Transportation (Non-Medical):   Physical Activity:   . Days of Exercise per Week:   . Minutes of Exercise per Session:   Stress:   . Feeling of Stress :   Social Connections:   . Frequency of Communication with Friends and Family:   . Frequency of Social Gatherings with Friends and  Family:   . Attends Religious Services:   . Active Member of Clubs or Organizations:   . Attends Archivist Meetings:   [name] Marital Status:   Intimate Partner Violence:   . Fear of Current or Ex-Partner:   . Emotionally Abused:   [name] Physically Abused:   . Sexually Abused:     No Known Allergies  Current Outpatient Medications  Medication Sig Dispense Refill  . aspirin EC 81 MG tablet Take 81 mg by mouth daily.    Javier Docker Oil (OMEGA-3) 500 MG CAPS Take 500 mg by mouth daily.    [name] lisinopril (ZESTRIL) 2.5 MG tablet Take 2.5 mg by mouth daily.    . metoprolol succinate (TOPROL-XL) 50 MG 24 hr  tablet Take 1 tablet (50 mg total) by mouth daily. Take with or immediately following a meal. 30 tablet 3  . Multiple Vitamins-Minerals (MULTIVITAMIN WITH MINERALS) tablet Take 1 tablet by mouth daily. Men 66 +    . nitroGLYCERIN (NITROSTAT) 0.4 MG SL tablet Place 1 tablet (0.4 mg total) under the tongue every 5 (five) minutes as needed for chest pain. 30 tablet 3  . tiZANidine (ZANAFLEX) 4 MG tablet Take 4 mg by mouth 2 (two) times daily as needed.    . traMADol (ULTRAM) 50 MG tablet Take 50 mg by mouth every 6 (six) hours as needed for moderate pain.      No current facility-administered medications for this visit.    ROS:   General:  No weight loss, Fever, chills  HEENT: No recent headaches, no nasal bleeding, no visual changes, no sore throat  Neurologic: No dizziness, blackouts, seizures. No recent symptoms of stroke or mini- stroke. No recent episodes of slurred speech, or temporary blindness.  Cardiac: No recent episodes of chest pain/pressure, no shortness of breath at rest.  No shortness of breath with exertion.  Denies history of atrial fibrillation or irregular heartbeat  Vascular: No history of rest pain in feet.  No history of claudication.  No history of non-healing ulcer, No history of DVT   Pulmonary: No home oxygen, no productive cough, no hemoptysis,  No asthma or wheezing  Musculoskeletal:  [ ]  Arthritis, [ ]  Low back pain,  [ ]  Joint pain  Hematologic:No history of hypercoagulable state.  No history of easy bleeding.  No history of anemia  Gastrointestinal: No hematochezia or melena,  No gastroesophageal reflux, no trouble swallowing  Urinary: [ ]  chronic Kidney disease, [ ]  on HD - [ ]  MWF or [ ]  TTHS, [ ]  Burning with urination, [ ]  Frequent urination, [ ]  Difficulty urinating;   Skin: No rashes  Psychological: No history of anxiety,  No history of depression   Physical Examination  Vitals:   01/18/20 1414 01/18/20 1420  BP: (!) 148/95 (!) 146/92  Pulse:  74   Resp: 20   Temp: (!) 97.5 F (36.4 C)   SpO2: 96%   Weight: 122 lb (55.3 kg)   Height: 5\' 9"  (1.753 m)     Body mass index is 18.02 kg/m.  General:  Alert and oriented, no acute distress HEENT: Normal Neck: No JVD Cardiac: Regular Rate and Rhythm Abdomen: Soft, non-tender, non-distended, no mass Skin: No rash Extremity Pulses:  2+ radial, brachial, femoral, 2+ right absent left dorsalis pedis, 2+ left absent right posterior tibial pulses Musculoskeletal: No deformity or edema  Neurologic: Upper and lower extremity motor 5/5 and symmetric  DATA:  Patient had a carotid duplex exam in our  office today to evaluate for possible preoperative planning purposes.  This again shows right internal carotid artery occlusion with greater than 80% left internal carotid artery stenosis there was normal carotid artery past the level of stenosis.  ASSESSMENT: Greater than 80% left internal carotid artery stenosis currently seems to be asymptomatic.  He is on aspirin.  I will leave it at the discretion of his cardiologist about starting a statin.  He is at high risk for stroke with his contralateral occlusion.  Patient is scheduled for a cardiac cath tomorrow based on a positive stress test.  Decision-making algorithm of his carotid disease will most likely be determined pending the outcome of this.  I called Dr. Erskine Emery Smith's office today and he is going to call me tomorrow regarding the patient's cardiac catheterization.  We will then make a plan for his carotid intervention based on the cardiac cath findings.  If the patient needs coronary artery bypass grafting he would most likely need combined carotid CABG.  If his intervention is percutaneous in nature most likely we would consider him for TCAR.   PLAN: See above.  I will contact the patient after his cardiac catheterization for definitive plan.   Fabienne Bruns, MD [facility] Office: [phone] Pager:  [phone]

## 2020-01-18 NOTE — H&P (View-Only) (Signed)
  Referring Physician: Dr Revenkar  Patient name: Tony Munoz MRN: [id number] DOB: 10/18/1955 Sex: male  REASON FOR CONSULT: Greater than 80% left internal carotid artery stenosis with contralateral occlusion  HPI: Tony Munoz is a 64 y.o. male, who was recently found to have greater than 80% left internal carotid artery stenosis with contralateral occlusion.  Duplex was done for asymptomatic bruit.  However, on questioning the patient today he states that he may have had a head CT in the past in [place] that might have showed an old stroke.  The patient is apparently scheduled for cardiac catheterization for tomorrow based on recent positive stress test.  He states overall he is fairly active and walks every day.  He has been a very heavy smoker in the past up to about 4 packs a day but is down to about a half a pack per day now and is trying to quit.  He has not really describe any episodes that sound like TIA amaurosis or stroke.  He had a multilevel C-spine fusion by Dr. Dumonski a few months ago and had no significant anesthesia complications.  Other medical problems include hypertension which has been controlled.  He is currently on aspirin.  He is not currently on a statin.  Past Medical History:  Diagnosis Date  . Abnormal EKG   . Bilateral carotid bruits 12/20/2019  . Carotid artery occlusion   . Cigarette smoker 12/20/2019  . DOE (dyspnea on exertion) 12/20/2019  . Essential hypertension 12/20/2019  . Prominent abdominal aortic pulsation 12/20/2019  . Radiculopathy 02/15/2019   Past Surgical History:  Procedure Laterality Date  . ANTERIOR CERVICAL DECOMPRESSION/DISCECTOMY FUSION 4 LEVELS Bilateral 02/15/2019   Procedure: ANTERIOR CERVICAL DECOMPRESSION FUSION, CERVICAL FOUR-FIVE, CERVICAL FIVE-SIX, CERVICAL SIX-SEVEN WITH INSTRUMENTATION AND ALLOGRAFT;  Surgeon: Dumonski, Mark, MD;  Location: [facility] OR;  Service: Orthopedics;  Laterality: Bilateral;  . EYE SURGERY Bilateral     Cataracts  . HERNIA REPAIR     double hernia - 40yrs ago    History reviewed. No pertinent family history.  SOCIAL HISTORY: Social History   Socioeconomic History  . Marital status: Single    Spouse name: Not on file  . Number of children: Not on file  . Years of education: Not on file  . Highest education level: Not on file  Occupational History  . Not on file  Tobacco Use  . Smoking status: Current Every Day Smoker    Packs/day: 1.50    Types: Cigarettes  . Smokeless tobacco: Never Used  Substance and Sexual Activity  . Alcohol use: Yes    Comment: on occassion  . Drug use: Never  . Sexual activity: Not on file  Other Topics Concern  . Not on file  Social History Narrative  . Not on file   Social Determinants of Health   Financial Resource Strain:   . Difficulty of Paying Living Expenses:   Food Insecurity:   . Worried About Running Out of Food in the Last Year:   . Ran Out of Food in the Last Year:   Transportation Needs:   . Lack of Transportation (Medical):   . Lack of Transportation (Non-Medical):   Physical Activity:   . Days of Exercise per Week:   . Minutes of Exercise per Session:   Stress:   . Feeling of Stress :   Social Connections:   . Frequency of Communication with Friends and Family:   . Frequency of Social Gatherings with Friends and   Family:   . Attends Religious Services:   . Active Member of Clubs or Organizations:   . Attends Archivist Meetings:   [name] Marital Status:   Intimate Partner Violence:   . Fear of Current or Ex-Partner:   . Emotionally Abused:   [name] Physically Abused:   . Sexually Abused:     No Known Allergies  Current Outpatient Medications  Medication Sig Dispense Refill  . aspirin EC 81 MG tablet Take 81 mg by mouth daily.    Javier Docker Oil (OMEGA-3) 500 MG CAPS Take 500 mg by mouth daily.    [name] lisinopril (ZESTRIL) 2.5 MG tablet Take 2.5 mg by mouth daily.    . metoprolol succinate (TOPROL-XL) 50 MG 24 hr  tablet Take 1 tablet (50 mg total) by mouth daily. Take with or immediately following a meal. 30 tablet 3  . Multiple Vitamins-Minerals (MULTIVITAMIN WITH MINERALS) tablet Take 1 tablet by mouth daily. Men 66 +    . nitroGLYCERIN (NITROSTAT) 0.4 MG SL tablet Place 1 tablet (0.4 mg total) under the tongue every 5 (five) minutes as needed for chest pain. 30 tablet 3  . tiZANidine (ZANAFLEX) 4 MG tablet Take 4 mg by mouth 2 (two) times daily as needed.    . traMADol (ULTRAM) 50 MG tablet Take 50 mg by mouth every 6 (six) hours as needed for moderate pain.      No current facility-administered medications for this visit.    ROS:   General:  No weight loss, Fever, chills  HEENT: No recent headaches, no nasal bleeding, no visual changes, no sore throat  Neurologic: No dizziness, blackouts, seizures. No recent symptoms of stroke or mini- stroke. No recent episodes of slurred speech, or temporary blindness.  Cardiac: No recent episodes of chest pain/pressure, no shortness of breath at rest.  No shortness of breath with exertion.  Denies history of atrial fibrillation or irregular heartbeat  Vascular: No history of rest pain in feet.  No history of claudication.  No history of non-healing ulcer, No history of DVT   Pulmonary: No home oxygen, no productive cough, no hemoptysis,  No asthma or wheezing  Musculoskeletal:  [ ]  Arthritis, [ ]  Low back pain,  [ ]  Joint pain  Hematologic:No history of hypercoagulable state.  No history of easy bleeding.  No history of anemia  Gastrointestinal: No hematochezia or melena,  No gastroesophageal reflux, no trouble swallowing  Urinary: [ ]  chronic Kidney disease, [ ]  on HD - [ ]  MWF or [ ]  TTHS, [ ]  Burning with urination, [ ]  Frequent urination, [ ]  Difficulty urinating;   Skin: No rashes  Psychological: No history of anxiety,  No history of depression   Physical Examination  Vitals:   01/18/20 1414 01/18/20 1420  BP: (!) 148/95 (!) 146/92  Pulse:  74   Resp: 20   Temp: (!) 97.5 F (36.4 C)   SpO2: 96%   Weight: 122 lb (55.3 kg)   Height: 5\' 9"  (1.753 m)     Body mass index is 18.02 kg/m.  General:  Alert and oriented, no acute distress HEENT: Normal Neck: No JVD Cardiac: Regular Rate and Rhythm Abdomen: Soft, non-tender, non-distended, no mass Skin: No rash Extremity Pulses:  2+ radial, brachial, femoral, 2+ right absent left dorsalis pedis, 2+ left absent right posterior tibial pulses Musculoskeletal: No deformity or edema  Neurologic: Upper and lower extremity motor 5/5 and symmetric  DATA:  Patient had a carotid duplex exam in our  office today to evaluate for possible preoperative planning purposes.  This again shows right internal carotid artery occlusion with greater than 80% left internal carotid artery stenosis there was normal carotid artery past the level of stenosis.  ASSESSMENT: Greater than 80% left internal carotid artery stenosis currently seems to be asymptomatic.  He is on aspirin.  I will leave it at the discretion of his cardiologist about starting a statin.  He is at high risk for stroke with his contralateral occlusion.  Patient is scheduled for a cardiac cath tomorrow based on a positive stress test.  Decision-making algorithm of his carotid disease will most likely be determined pending the outcome of this.  I called Dr. Erskine Emery Smith's office today and he is going to call me tomorrow regarding the patient's cardiac catheterization.  We will then make a plan for his carotid intervention based on the cardiac cath findings.  If the patient needs coronary artery bypass grafting he would most likely need combined carotid CABG.  If his intervention is percutaneous in nature most likely we would consider him for TCAR.   PLAN: See above.  I will contact the patient after his cardiac catheterization for definitive plan.   Fabienne Bruns, MD [facility] Office: [phone] Pager:  [phone]

## 2020-01-19 ENCOUNTER — Encounter (HOSPITAL_COMMUNITY)
Admission: RE | Disposition: A | Discharge status: Home / Self Care | Payer: Self-pay | Source: Home / Self Care | Attending: Interventional Cardiology

## 2020-01-19 ENCOUNTER — Ambulatory Visit (HOSPITAL_COMMUNITY)
Admission: RE | Admit: 2020-01-19 | Discharge: 2020-01-19 | Disposition: A | Discharge status: Home / Self Care | Payer: Medicare HMO | Attending: Interventional Cardiology | Admitting: Interventional Cardiology

## 2020-01-19 ENCOUNTER — Ambulatory Visit: Payer: Medicare HMO | Admitting: Cardiology

## 2020-01-19 ENCOUNTER — Other Ambulatory Visit: Payer: Self-pay

## 2020-01-19 DIAGNOSIS — F17210 Nicotine dependence, cigarettes, uncomplicated: Secondary | ICD-10-CM | POA: Diagnosis present

## 2020-01-19 DIAGNOSIS — R0609 Other forms of dyspnea: Secondary | ICD-10-CM | POA: Insufficient documentation

## 2020-01-19 DIAGNOSIS — R9439 Abnormal result of other cardiovascular function study: Secondary | ICD-10-CM | POA: Diagnosis not present

## 2020-01-19 DIAGNOSIS — I25110 Atherosclerotic heart disease of native coronary artery with unstable angina pectoris: Secondary | ICD-10-CM | POA: Insufficient documentation

## 2020-01-19 DIAGNOSIS — I6523 Occlusion and stenosis of bilateral carotid arteries: Secondary | ICD-10-CM | POA: Insufficient documentation

## 2020-01-19 DIAGNOSIS — I2510 Atherosclerotic heart disease of native coronary artery without angina pectoris: Secondary | ICD-10-CM

## 2020-01-19 DIAGNOSIS — I2582 Chronic total occlusion of coronary artery: Secondary | ICD-10-CM | POA: Insufficient documentation

## 2020-01-19 DIAGNOSIS — I10 Essential (primary) hypertension: Secondary | ICD-10-CM | POA: Diagnosis not present

## 2020-01-19 DIAGNOSIS — Z79899 Other long term (current) drug therapy: Secondary | ICD-10-CM | POA: Diagnosis not present

## 2020-01-19 DIAGNOSIS — R0600 Dyspnea, unspecified: Secondary | ICD-10-CM

## 2020-01-19 DIAGNOSIS — R0989 Other specified symptoms and signs involving the circulatory and respiratory systems: Secondary | ICD-10-CM | POA: Diagnosis present

## 2020-01-19 HISTORY — PX: LEFT HEART CATH AND CORONARY ANGIOGRAPHY: CATH118249

## 2020-01-19 SURGERY — LEFT HEART CATH AND CORONARY ANGIOGRAPHY
Anesthesia: LOCAL

## 2020-01-19 MED ORDER — FENTANYL CITRATE (PF) 100 MCG/2ML IJ SOLN
INTRAMUSCULAR | Status: AC
  Filled 2020-01-19: qty 2

## 2020-01-19 MED ORDER — SODIUM CHLORIDE 0.9 % IV SOLN: 250.0000 mL | INTRAVENOUS | Status: DC | PRN

## 2020-01-19 MED ORDER — SODIUM CHLORIDE 0.9 % WEIGHT BASED INFUSION: 1.0000 mL/kg/h | INTRAVENOUS | Status: DC

## 2020-01-19 MED ORDER — SODIUM CHLORIDE 0.9 % WEIGHT BASED INFUSION
3.0000 mL/kg/h | INTRAVENOUS | Status: AC
  Administered 2020-01-19: 3 mL/kg/h via INTRAVENOUS

## 2020-01-19 MED ORDER — HEPARIN (PORCINE) IN NACL 1000-0.9 UT/500ML-% IV SOLN
INTRAVENOUS | Status: AC
  Filled 2020-01-19: qty 1000

## 2020-01-19 MED ORDER — HEPARIN SODIUM (PORCINE) 1000 UNIT/ML IJ SOLN
INTRAMUSCULAR | Status: DC | PRN
  Administered 2020-01-19: 3000 [IU] via INTRAVENOUS

## 2020-01-19 MED ORDER — MIDAZOLAM HCL 2 MG/2ML IJ SOLN
INTRAMUSCULAR | Status: DC | PRN
  Administered 2020-01-19: 1 mg via INTRAVENOUS

## 2020-01-19 MED ORDER — MIDAZOLAM HCL 2 MG/2ML IJ SOLN
INTRAMUSCULAR | Status: AC
  Filled 2020-01-19: qty 2

## 2020-01-19 MED ORDER — VERAPAMIL HCL 2.5 MG/ML IV SOLN
INTRAVENOUS | Status: DC | PRN
  Administered 2020-01-19: 10 mL via INTRA_ARTERIAL

## 2020-01-19 MED ORDER — HEPARIN (PORCINE) IN NACL 1000-0.9 UT/500ML-% IV SOLN
INTRAVENOUS | Status: DC | PRN
  Administered 2020-01-19 (×2): 500 mL

## 2020-01-19 MED ORDER — SODIUM CHLORIDE 0.9% FLUSH: 3.0000 mL | INTRAVENOUS | Status: DC | PRN

## 2020-01-19 MED ORDER — FENTANYL CITRATE (PF) 100 MCG/2ML IJ SOLN
INTRAMUSCULAR | Status: DC | PRN
  Administered 2020-01-19: 25 ug via INTRAVENOUS

## 2020-01-19 MED ORDER — ASPIRIN 81 MG PO CHEW: 81.0000 mg | CHEWABLE_TABLET | ORAL | Status: DC

## 2020-01-19 MED ORDER — SODIUM CHLORIDE 0.9% FLUSH: 3.0000 mL | Freq: Two times a day (BID) | INTRAVENOUS | Status: DC

## 2020-01-19 MED ORDER — HEPARIN SODIUM (PORCINE) 1000 UNIT/ML IJ SOLN
INTRAMUSCULAR | Status: AC
  Filled 2020-01-19: qty 1

## 2020-01-19 MED ORDER — IOHEXOL 350 MG/ML SOLN
INTRAVENOUS | Status: DC | PRN
  Administered 2020-01-19: 85 mL

## 2020-01-19 MED ORDER — VERAPAMIL HCL 2.5 MG/ML IV SOLN
INTRAVENOUS | Status: AC
  Filled 2020-01-19: qty 2

## 2020-01-19 MED ORDER — LIDOCAINE HCL (PF) 1 % IJ SOLN
INTRAMUSCULAR | Status: DC | PRN
  Administered 2020-01-19: 5 mL

## 2020-01-19 MED ORDER — LIDOCAINE HCL (PF) 1 % IJ SOLN
INTRAMUSCULAR | Status: AC
  Filled 2020-01-19: qty 30

## 2020-01-19 SURGICAL SUPPLY — 9 items
CATH 5FR JL3.5 JR4 ANG PIG MP (CATHETERS) ×1 IMPLANT
DEVICE RAD TR BAND REGULAR (VASCULAR PRODUCTS) ×1 IMPLANT
GLIDESHEATH SLEND A-KIT 6F 22G (SHEATH) ×1 IMPLANT
GUIDEWIRE INQWIRE 1.5J.035X260 (WIRE) IMPLANT
INQWIRE 1.5J .035X260CM (WIRE) ×2
KIT HEART LEFT (KITS) ×2 IMPLANT
PACK CARDIAC CATHETERIZATION (CUSTOM PROCEDURE TRAY) ×2 IMPLANT
TRANSDUCER W/STOPCOCK (MISCELLANEOUS) ×2 IMPLANT
TUBING CIL FLEX 10 FLL-RA (TUBING) ×2 IMPLANT

## 2020-01-19 NOTE — Interval H&P Note (Signed)
Cath Lab Visit (complete for each Cath Lab visit)  Clinical Evaluation Leading to the Procedure:   ACS: No.  Non-ACS:    Anginal Classification: CCS III  Anti-ischemic medical therapy: No Therapy  Non-Invasive Test Results: No non-invasive testing performed  Prior CABG: No previous CABG      History and Physical Interval Note:  01/19/2020 7:33 AM  Tony Munoz  has presented today for surgery, with the diagnosis of angina.  The various methods of treatment have been discussed with the patient and family. After consideration of risks, benefits and other options for treatment, the patient has consented to  Procedure(s): LEFT HEART CATH AND CORONARY ANGIOGRAPHY (N/A) as a surgical intervention.  The patient's history has been reviewed, patient examined, no change in status, stable for surgery.  I have reviewed the patient's chart and labs.  Questions were answered to the patient's satisfaction.     Lyn Records III

## 2020-01-19 NOTE — Discharge Instructions (Signed)
Please attach [facility] appointment and given instructions concerning location of office.  Needs to be seen within the next 4 days.   Radial Site Care  This sheet gives you information about how to care for yourself after your procedure. Your health care provider may also give you more specific instructions. If you have problems or questions, contact your health care provider. What can I expect after the procedure? After the procedure, it is common to have:  Bruising and tenderness at the catheter insertion area. Follow these instructions at home: Medicines  Take over-the-counter and prescription medicines only as told by your health care provider. Insertion site care  Follow instructions from your health care provider about how to take care of your insertion site. Make sure you: ? Wash your hands with soap and water before you change your bandage (dressing). If soap and water are not available, use hand sanitizer. ? Change your dressing as told by your health care provider. ? Leave stitches (sutures), skin glue, or adhesive strips in place. These skin closures may need to stay in place for 2 weeks or longer. If adhesive strip edges start to loosen and curl up, you may trim the loose edges. Do not remove adhesive strips completely unless your health care provider tells you to do that.  Check your insertion site every day for signs of infection. Check for: ? Redness, swelling, or pain. ? Fluid or blood. ? Pus or a bad smell. ? Warmth.  Do not take baths, swim, or use a hot tub until your health care provider approves.  You may shower 24-48 hours after the procedure, or as directed by your health care provider. ? Remove the dressing and gently wash the site with plain soap and water. ? Pat the area dry with a clean towel. ? Do not rub the site. That could cause bleeding.  Do not apply powder or lotion to the site. Activity   For 24 hours after the procedure, or as directed by your  health care provider: ? Do not flex or bend the affected arm. ? Do not push or pull heavy objects with the affected arm. ? Do not drive yourself home from the hospital or clinic. You may drive 24 hours after the procedure unless your health care provider tells you not to. ? Do not operate machinery or power tools.  Do not lift anything that is heavier than 10 lb (4.5 kg), or the limit that you are told, until your health care provider says that it is safe.  Ask your health care provider when it is okay to: ? Return to work or school. ? Resume usual physical activities or sports. ? Resume sexual activity. General instructions  If the catheter site starts to bleed, raise your arm and put firm pressure on the site. If the bleeding does not stop, get help right away. This is a medical emergency.  If you went home on the same day as your procedure, a responsible adult should be with you for the first 24 hours after you arrive home.  Keep all follow-up visits as told by your health care provider. This is important. Contact a health care provider if:  You have a fever.  You have redness, swelling, or yellow drainage around your insertion site. Get help right away if:  You have unusual pain at the radial site.  The catheter insertion area swells very fast.  The insertion area is bleeding, and the bleeding does not stop when you  hold steady pressure on the area.  Your arm or hand becomes pale, cool, tingly, or numb. These symptoms may represent a serious problem that is an emergency. Do not wait to see if the symptoms will go away. Get medical help right away. Call your local emergency services (911 in the [place] Do not drive yourself to the hospital. Summary  After the procedure, it is common to have bruising and tenderness at the site.  Follow instructions from your health care provider about how to take care of your radial site wound. Check the wound every day for signs of  infection.  Do not lift anything that is heavier than 10 lb (4.5 kg), or the limit that you are told, until your health care provider says that it is safe. This information is not intended to replace advice given to you by your health care provider. Make sure you discuss any questions you have with your health care provider. Document Revised: 09/29/2017 Document Reviewed: 09/29/2017 Elsevier Patient Education  2020 [employer].

## 2020-01-19 NOTE — CV Procedure (Signed)
   Coronary angiography with left heart cath and hemodynamic recordings via right radial using real-time vascular ultrasound for access.  Chronic total occlusion of the mid RCA  Moderately severe diffuse proximal to mid LAD 75 to 80% with 85% distal LAD  Large first diagonal 95% smaller second diagonal 80% ostial (proximal to the more distal LAD stenosis).  Circumflex is small and nondominant  LVEF 40 to 50%.  LVEDP is normal.  Needs combined carotid and coronary revascularization.  He has been seen by Dr. Darrick Penna.  We will facilitate ASAP cardiac surgical evaluation to facilitate plan combined carotid and coronary revascularization.

## 2020-01-19 NOTE — Progress Notes (Signed)
Pt received from cath lab with TRB (R) radial. Firm hematoma noted at base of TRB and increasing in size.  Manual pressure held x 15 min . Hematoma unresolved. Pressure held additional 10 min with resolution of hematoma

## 2020-01-22 ENCOUNTER — Encounter: Payer: Medicare HMO | Admitting: Cardiothoracic Surgery

## 2020-01-24 ENCOUNTER — Institutional Professional Consult (permissible substitution): Payer: Medicare HMO | Admitting: Cardiothoracic Surgery

## 2020-01-24 ENCOUNTER — Other Ambulatory Visit: Payer: Self-pay

## 2020-01-24 ENCOUNTER — Other Ambulatory Visit: Payer: Self-pay | Admitting: *Deleted

## 2020-01-24 ENCOUNTER — Other Ambulatory Visit: Payer: Self-pay | Admitting: Cardiothoracic Surgery

## 2020-01-24 VITALS — BP 165/98 | HR 80 | Temp 97.5°F | Resp 20 | Ht 69.0 in | Wt 120.0 lb

## 2020-01-24 DIAGNOSIS — I2510 Atherosclerotic heart disease of native coronary artery without angina pectoris: Secondary | ICD-10-CM

## 2020-01-24 DIAGNOSIS — G3281 Cerebellar ataxia in diseases classified elsewhere: Secondary | ICD-10-CM

## 2020-01-24 NOTE — Progress Notes (Signed)
[address]       [name] [address]             [phone]     CARDIOTHORACIC SURGERY CONSULTATION REPORT  Referring Provider is Lyn Records, MD Primary Cardiologist is No primary care provider on file. PCP is Buckner Malta, MD  Chief Complaint  Patient presents with  . Consult    CAD, Mitral valve regurgitation    HPI: 64 year old man with hypertension noted increased shortness of breath with exertion.  He was evaluated locally and underwent a stress test which was positive.  This led to left heart catheterization which demonstrated severe multivessel coronary artery disease.  In the work-up he was also noted to have severe cerebrovascular disease.  In addition, he is found to have moderate mitral regurgitation.  Patient is referred for consideration of coronary artery bypass grafting with mitral surgery.  He is presently clinically stable.  He denies exertional or rest chest pain.  He has mild shortness of breath with exertion.  He remains hypertensive.   Past Medical History:  Diagnosis Date  . Abnormal EKG   . Bilateral carotid bruits 12/20/2019  . Carotid artery occlusion   . Cigarette smoker 12/20/2019  . DOE (dyspnea on exertion) 12/20/2019  . Essential hypertension 12/20/2019  . Prominent abdominal aortic pulsation 12/20/2019  . Radiculopathy 02/15/2019    Past Surgical History:  Procedure Laterality Date  . ANTERIOR CERVICAL DECOMPRESSION/DISCECTOMY FUSION 4 LEVELS Bilateral 02/15/2019   Procedure: ANTERIOR CERVICAL DECOMPRESSION FUSION, CERVICAL FOUR-FIVE, CERVICAL FIVE-SIX, CERVICAL SIX-SEVEN WITH INSTRUMENTATION AND ALLOGRAFT;  Surgeon: Estill Bamberg, MD;  Location: [facility] OR;  Service: Orthopedics;  Laterality: Bilateral;  . EYE SURGERY Bilateral    Cataracts  . HERNIA REPAIR     double hernia - 45yrs ago  . LEFT HEART CATH AND CORONARY ANGIOGRAPHY N/A 01/19/2020   Procedure: LEFT HEART CATH AND CORONARY ANGIOGRAPHY;  Surgeon: Lyn Records, MD;  Location: [facility] IN[facility];  Service: Cardiovascular;  Laterality: N/A;    No family history on file.  Social History   Socioeconomic History  . Marital status: Single    Spouse name: Not on file  . Number of children: Not on file  . Years of education: Not on file  . Highest education level: Not on file  Occupational History  . Not on file  Tobacco Use  . Smoking status: Current Every Day Smoker    Packs/day: 1.50    Types: Cigarettes  . Smokeless tobacco: Never Used  Substance and Sexual Activity  . Alcohol use: Yes    Comment: on occassion  . Drug use: Never  . Sexual activity: Not on file  Other Topics Concern  . Not on file  Social History Narrative  . Not on file   Social Determinants of Health   Financial Resource Strain:   . Difficulty of Paying Living Expenses:   Food Insecurity:   . Worried About Programme researcher, broadcasting/film/video in the Last Year:   . Barista in the Last Year:   Transportation Needs:   . Freight forwarder (Medical):   [name] Lack of Transportation (Non-Medical):   Physical Activity:   . Days of Exercise per Week:   . Minutes of Exercise per Session:   Stress:   . Feeling of Stress :   Social Connections:   . Frequency of Communication with Friends and Family:   . Frequency of Social Gatherings with  Friends and Family:   . Attends Religious Services:   . Active Member of Clubs or Organizations:   . Attends Banker Meetings:   [name] Marital Status:   Intimate Partner Violence:   . Fear of Current or Ex-Partner:   . Emotionally Abused:   [name] Physically Abused:   . Sexually Abused:     Current Outpatient Medications  Medication Sig Dispense Refill  . aspirin EC 81 MG tablet Take 81 mg by mouth daily.    Boris Lown Oil (OMEGA-3) 500 MG CAPS Take 500 mg by mouth daily.    [name] lisinopril (ZESTRIL) 2.5 MG tablet Take 2.5 mg by mouth daily.    . metoprolol succinate (TOPROL-XL) 50 MG 24 hr tablet Take 1 tablet (50 mg total)  by mouth daily. Take with or immediately following a meal. 30 tablet 3  . Multiple Vitamins-Minerals (MULTIVITAMIN WITH MINERALS) tablet Take 1 tablet by mouth daily. Men 50 +    . nitroGLYCERIN (NITROSTAT) 0.4 MG SL tablet Place 1 tablet (0.4 mg total) under the tongue every 5 (five) minutes as needed for chest pain. 30 tablet 3  . tiZANidine (ZANAFLEX) 4 MG tablet Take 4 mg by mouth 2 (two) times daily as needed.    . traMADol (ULTRAM) 50 MG tablet Take 50 mg by mouth every 6 (six) hours as needed for moderate pain.     . vitamin C (ASCORBIC ACID) 250 MG tablet Take 500 mg by mouth 2 (two) times daily.     No current facility-administered medications for this visit.    No Known Allergies    Review of Systems:   General:  Reduced energy, no weight change  Cardiac:  No chest pain with exertion, positive SOB with moderate exertion, denies arrhythmia/atrial fibrillation,   Respiratory:  Positive shortness of breath, no home oxygen,  GI:   Negative  GU:   Negative  Vascular:  Severe cerebrovascular disease  Neuro:   History of stroke/ TIA's,   Musculoskeletal: Positive arthritis/ myalgias,  Skin:   Negative  Psych:   Negative  Eyes:   Negative  ENT:   Negative  Hematologic:  Positive easy bruising,   Endocrine:  No diabetes, does not check CBG's at home     Physical Exam:   BP (!) 165/98 (BP Location: Right Arm)   Pulse 80   Temp (!) 97.5 F (36.4 C) (Temporal)   Resp 20   Ht 5\' 9"  (1.753 m)   Wt 54.4 kg   SpO2 96% Comment: RA  BMI 17.72 kg/m   General:   well-appearing, quite thin   HEENT:  Unremarkable   Neck:   no JVD, no bruits, no adenopathy   Chest:   clear to auscultation, symmetrical breath sounds, no wheezes, no rhonchi   CV:   RRR, 2/6 SEM at LSB  Abdomen:  soft, non-tender, no masses   Extremities:  warm, well-perfused, pulses intact, no LE edema  Rectal/GU  Deferred  Neuro:   Grossly non-focal and symmetrical throughout  Skin:   Clean and dry, no rashes,  no breakdown   Diagnostic Tests:  I have reviewed available imaging and agree with interpretation   Impression:  64 year old man with multivessel coronary artery disease and severe cerebrovascular disease.  He is a good candidate for my carotid CABG surgery.   Plan:  Tentatively planned combined carotid CABG with Dr. 76 on February 06, 2020. I would prefer a preoperative head CT given his history of strokes.  He  also has a relatively high perioperative stroke risk in a baseline neuro assessment would be helpful should he run into trouble postoperative.  Other routine work-up as per usual for CABG.   I spent in excess of 40 minutes during the conduct of this office consultation and >50% of this time involved direct face-to-face encounter with the patient for counseling and/or coordination of their care.          Level 3 Office Consult = 40 minutes         Level 4 Office Consult = 60 minutes         Level 5 Office Consult = 80 minutes  B. Murvin Natal, MD 01/24/2020 8:10 PM

## 2020-01-25 ENCOUNTER — Other Ambulatory Visit: Payer: Self-pay | Admitting: *Deleted

## 2020-01-25 ENCOUNTER — Encounter: Payer: Self-pay | Admitting: *Deleted

## 2020-01-25 DIAGNOSIS — I2510 Atherosclerotic heart disease of native coronary artery without angina pectoris: Secondary | ICD-10-CM

## 2020-01-29 ENCOUNTER — Other Ambulatory Visit: Payer: Self-pay

## 2020-01-29 ENCOUNTER — Encounter: Payer: Medicare HMO | Admitting: Cardiothoracic Surgery

## 2020-01-31 ENCOUNTER — Other Ambulatory Visit (HOSPITAL_COMMUNITY)
Admission: RE | Admit: 2020-01-31 | Discharge: 2020-01-31 | Disposition: A | Discharge status: Home / Self Care | Payer: Medicare HMO | Source: Ambulatory Visit | Attending: Cardiothoracic Surgery | Admitting: Cardiothoracic Surgery

## 2020-01-31 DIAGNOSIS — Z20822 Contact with and (suspected) exposure to covid-19: Secondary | ICD-10-CM | POA: Insufficient documentation

## 2020-01-31 DIAGNOSIS — Z01812 Encounter for preprocedural laboratory examination: Secondary | ICD-10-CM | POA: Insufficient documentation

## 2020-01-31 DIAGNOSIS — I2510 Atherosclerotic heart disease of native coronary artery without angina pectoris: Secondary | ICD-10-CM

## 2020-01-31 LAB — SARS CORONAVIRUS 2 (TAT 6-24 HRS): SARS Coronavirus 2: NEGATIVE

## 2020-02-01 DIAGNOSIS — I6930 Unspecified sequelae of cerebral infarction: Secondary | ICD-10-CM | POA: Diagnosis not present

## 2020-02-01 DIAGNOSIS — G459 Transient cerebral ischemic attack, unspecified: Secondary | ICD-10-CM | POA: Diagnosis not present

## 2020-02-01 DIAGNOSIS — I639 Cerebral infarction, unspecified: Secondary | ICD-10-CM | POA: Diagnosis not present

## 2020-02-01 NOTE — Progress Notes (Signed)
[facility] [address] [place] - [address] [place] [address] Phone: [phone] Fax: [phone]      Your procedure is scheduled on February 06, 2020.  Report to [facility] Main Entrance "A" at 05:30 A.M., and check in at the Admitting office.  Call this number if you have problems the morning of surgery:  [phone]  Call [phone] if you have any questions prior to your surgery date Monday-Friday 8am-4pm   Remember:  Do not eat or drink after midnight the night before your surgery   Take these medicines the morning of surgery with A SIP OF WATER : Metoprolol Succinate (Toprol-XL) Nitroglycerin (Nitrostat) if needed Tramadol (Ultram) if needed  Follow your surgeon's instructions on when to stop Aspirin.  If no instructions were given by your surgeon then you will need to call the office to get those instructions.    As of today, STOP taking any Aleve, Naproxen, Ibuprofen, Motrin, Advil, Goody's, BC's, all herbal medications, fish oil, and all vitamins.                      Do not wear jewelry.            Do not wear lotions, powders, perfumes/colognes, or deodorant.            Do not shave 48 hours prior to surgery.  Men may shave face and neck.            Do not bring valuables to the hospital.            [facility] is not responsible for any belongings or valuables.  Do NOT Smoke (Tobacco/Vapping) or drink Alcohol 24 hours prior to your procedure If you use a CPAP at night, you may bring all equipment for your overnight stay.   Contacts, glasses, dentures or bridgework may not be worn into surgery.      For patients admitted to the hospital, discharge time will be determined by your treatment team.   Patients discharged the day of surgery will not be allowed to drive home, and someone needs to stay with them for 24 hours.    Special instructions:   [facility]- Preparing For Surgery  Before surgery, you can play an  important role. Because skin is not sterile, your skin needs to be as free of germs as possible. You can reduce the number of germs on your skin by washing with CHG (chlorahexidine gluconate) Soap before surgery.  CHG is an antiseptic cleaner which kills germs and bonds with the skin to continue killing germs even after washing.    Oral Hygiene is also important to reduce your risk of infection.  Remember - BRUSH YOUR TEETH THE MORNING OF SURGERY WITH YOUR REGULAR TOOTHPASTE  Please do not use if you have an allergy to CHG or antibacterial soaps. If your skin becomes reddened/irritated stop using the CHG.  Do not shave (including legs and underarms) for at least 48 hours prior to first CHG shower. It is OK to shave your face.  Please follow these instructions carefully.   1. Shower the NIGHT BEFORE SURGERY and the MORNING OF SURGERY with CHG Soap.   2. If you chose to wash your hair, wash your hair first as usual with your normal shampoo.  3. After you shampoo, rinse your hair and body thoroughly to remove the shampoo.  4. Use CHG as you would any other liquid soap. You  can apply CHG directly to the skin and wash gently with a scrungie or a clean washcloth.   5. Apply the CHG Soap to your body ONLY FROM THE NECK DOWN.  Do not use on open wounds or open sores. Avoid contact with your eyes, ears, mouth and genitals (private parts). Wash Face and genitals (private parts)  with your normal soap.   6. Wash thoroughly, paying special attention to the area where your surgery will be performed.  7. Thoroughly rinse your body with warm water from the neck down.  8. DO NOT shower/wash with your normal soap after using and rinsing off the CHG Soap.  9. Pat yourself dry with a CLEAN TOWEL.  10. Wear CLEAN PAJAMAS to bed the night before surgery, wear comfortable clothes the morning of surgery  11. Place CLEAN SHEETS on your bed the night of your first shower and DO NOT SLEEP WITH PETS.   Day of  Surgery:   Do not apply any deodorants/lotions.  Please wear clean clothes to the hospital/surgery center.   Remember to brush your teeth WITH YOUR REGULAR TOOTHPASTE.   Please read over the following fact sheets that you were given.

## 2020-02-02 ENCOUNTER — Encounter (HOSPITAL_COMMUNITY)
Admission: RE | Admit: 2020-02-02 | Discharge: 2020-02-02 | Disposition: A | Discharge status: Home / Self Care | Payer: Medicare HMO | Source: Ambulatory Visit | Attending: Cardiothoracic Surgery | Admitting: Cardiothoracic Surgery

## 2020-02-02 ENCOUNTER — Ambulatory Visit (HOSPITAL_COMMUNITY)
Admission: RE | Admit: 2020-02-02 | Discharge: 2020-02-02 | Disposition: A | Discharge status: Home / Self Care | Payer: Medicare HMO | Source: Ambulatory Visit | Attending: Cardiothoracic Surgery | Admitting: Cardiothoracic Surgery

## 2020-02-02 ENCOUNTER — Other Ambulatory Visit: Payer: Self-pay

## 2020-02-02 ENCOUNTER — Other Ambulatory Visit (HOSPITAL_COMMUNITY)
Admission: RE | Admit: 2020-02-02 | Discharge: 2020-02-02 | Disposition: A | Discharge status: Home / Self Care | Payer: Medicare HMO | Source: Ambulatory Visit | Attending: Cardiothoracic Surgery | Admitting: Cardiothoracic Surgery

## 2020-02-02 ENCOUNTER — Encounter (HOSPITAL_COMMUNITY): Payer: Self-pay

## 2020-02-02 DIAGNOSIS — I2510 Atherosclerotic heart disease of native coronary artery without angina pectoris: Secondary | ICD-10-CM

## 2020-02-02 DIAGNOSIS — J449 Chronic obstructive pulmonary disease, unspecified: Secondary | ICD-10-CM | POA: Diagnosis not present

## 2020-02-02 DIAGNOSIS — Z20822 Contact with and (suspected) exposure to covid-19: Secondary | ICD-10-CM | POA: Insufficient documentation

## 2020-02-02 DIAGNOSIS — Z01818 Encounter for other preprocedural examination: Secondary | ICD-10-CM | POA: Insufficient documentation

## 2020-02-02 HISTORY — DX: Anxiety disorder, unspecified: F41.9

## 2020-02-02 HISTORY — DX: Dyspnea, unspecified: R06.00

## 2020-02-02 HISTORY — DX: Atherosclerotic heart disease of native coronary artery without angina pectoris: I25.10

## 2020-02-02 LAB — PULMONARY FUNCTION TEST
DL/VA % pred: 59 %
DL/VA: 2.49 ml/min/mmHg/L
DLCO cor % pred: 56 %
DLCO cor: 14.68 ml/min/mmHg
DLCO unc % pred: 60 %
DLCO unc: 15.78 ml/min/mmHg
FEF 25-75 Post: 0.86 L/sec
FEF 25-75 Pre: 0.58 L/sec
FEF2575-%Change-Post: 49 %
FEF2575-%Pred-Post: 32 %
FEF2575-%Pred-Pre: 21 %
FEV1-%Change-Post: 14 %
FEV1-%Pred-Post: 52 %
FEV1-%Pred-Pre: 45 %
FEV1-Post: 1.73 L
FEV1-Pre: 1.51 L
FEV1FVC-%Change-Post: -7 %
FEV1FVC-%Pred-Pre: 63 %
FEV6-%Change-Post: 15 %
FEV6-%Pred-Post: 79 %
FEV6-%Pred-Pre: 68 %
FEV6-Post: 3.34 L
FEV6-Pre: 2.89 L
FEV6FVC-%Change-Post: -7 %
FEV6FVC-%Pred-Post: 88 %
FEV6FVC-%Pred-Pre: 95 %
FVC-%Change-Post: 23 %
FVC-%Pred-Post: 89 %
FVC-%Pred-Pre: 72 %
FVC-Post: 3.97 L
FVC-Pre: 3.21 L
Post FEV1/FVC ratio: 44 %
Post FEV6/FVC ratio: 84 %
Pre FEV1/FVC ratio: 47 %
Pre FEV6/FVC Ratio: 91 %
RV % pred: 216 %
RV: 4.96 L
TLC % pred: 123 %
TLC: 8.42 L

## 2020-02-02 LAB — COMPREHENSIVE METABOLIC PANEL
ALT: 16 U/L (ref 0–44)
AST: 22 U/L (ref 15–41)
Albumin: 3.7 g/dL (ref 3.5–5.0)
Alkaline Phosphatase: 88 U/L (ref 38–126)
Anion gap: 10 (ref 5–15)
BUN: 10 mg/dL (ref 8–23)
CO2: 21 mmol/L — ABNORMAL LOW (ref 22–32)
Calcium: 8.7 mg/dL — ABNORMAL LOW (ref 8.9–10.3)
Chloride: 106 mmol/L (ref 98–111)
Creatinine, Ser: 0.98 mg/dL (ref 0.61–1.24)
GFR calc Af Amer: >60 mL/min (ref >60)
GFR calc non Af Amer: >60 mL/min (ref >60)
Glucose, Bld: 104 mg/dL — ABNORMAL HIGH (ref 70–99)
Potassium: 3.8 mmol/L (ref 3.5–5.1)
Sodium: 137 mmol/L (ref 135–145)
Total Bilirubin: 0.8 mg/dL (ref 0.3–1.2)
Total Protein: 6.3 g/dL — ABNORMAL LOW (ref 6.5–8.1)

## 2020-02-02 LAB — URINALYSIS, ROUTINE W REFLEX MICROSCOPIC
Bilirubin Urine: NEGATIVE
Glucose, UA: NEGATIVE mg/dL
Hgb urine dipstick: NEGATIVE
Ketones, ur: NEGATIVE mg/dL
Leukocytes,Ua: NEGATIVE
Nitrite: NEGATIVE
Protein, ur: NEGATIVE mg/dL
Specific Gravity, Urine: 1.017 (ref 1.005–1.030)
pH: 5 (ref 5.0–8.0)

## 2020-02-02 LAB — CBC
HCT: 53 % — ABNORMAL HIGH (ref 39.0–52.0)
Hemoglobin: 17.6 g/dL — ABNORMAL HIGH (ref 13.0–17.0)
MCH: 32.4 pg (ref 26.0–34.0)
MCHC: 33.2 g/dL (ref 30.0–36.0)
MCV: 97.6 fL (ref 80.0–100.0)
Platelets: 144 10*3/uL — ABNORMAL LOW (ref 150–400)
RBC: 5.43 MIL/uL (ref 4.22–5.81)
RDW: 12.7 % (ref 11.5–15.5)
WBC: 6.1 10*3/uL (ref 4.0–10.5)
nRBC: 0 % (ref 0.0–0.2)

## 2020-02-02 LAB — BLOOD GAS, ARTERIAL
Acid-Base Excess: 0.5 mmol/L (ref 0.0–2.0)
Bicarbonate: 24.4 mmol/L (ref 20.0–28.0)
Drawn by: 421801
FIO2: 21
O2 Saturation: 98 %
Patient temperature: 37
pCO2 arterial: 38.6 mmHg (ref 32.0–48.0)
pH, Arterial: 7.418 (ref 7.350–7.450)
pO2, Arterial: 104 mmHg (ref 83.0–108.0)

## 2020-02-02 LAB — SARS CORONAVIRUS 2 (TAT 6-24 HRS): SARS Coronavirus 2: NEGATIVE

## 2020-02-02 LAB — PROTIME-INR
INR: 1 (ref 0.8–1.2)
Prothrombin Time: 13 seconds (ref 11.4–15.2)

## 2020-02-02 LAB — HEMOGLOBIN A1C
Hgb A1c MFr Bld: 5.2 % (ref 4.8–5.6)
Mean Plasma Glucose: 102.54 mg/dL

## 2020-02-02 LAB — APTT: aPTT: 29 seconds (ref 24–36)

## 2020-02-02 LAB — ABO/RH: ABO/RH(D): A POS

## 2020-02-02 LAB — SURGICAL PCR SCREEN
MRSA, PCR: NEGATIVE
Staphylococcus aureus: NEGATIVE

## 2020-02-02 MED ORDER — INSULIN REGULAR(HUMAN) IN NACL 100-0.9 UT/100ML-% IV SOLN
INTRAVENOUS | Status: AC
  Administered 2020-02-06: 1.1 [IU]/h via INTRAVENOUS
  Filled 2020-02-02: qty 100

## 2020-02-02 MED ORDER — TRANEXAMIC ACID (OHS) BOLUS VIA INFUSION
15.0000 mg/kg | INTRAVENOUS | Status: DC
  Filled 2020-02-02: qty 828

## 2020-02-02 MED ORDER — MAGNESIUM SULFATE 50 % IJ SOLN
40.0000 meq | INTRAMUSCULAR | Status: DC
  Filled 2020-02-02: qty 9.85

## 2020-02-02 MED ORDER — NOREPINEPHRINE 4 MG/250ML-% IV SOLN
0.0000 ug/min | INTRAVENOUS | Status: DC
  Filled 2020-02-02: qty 250

## 2020-02-02 MED ORDER — VANCOMYCIN HCL 1250 MG/250ML IV SOLN
1250.0000 mg | INTRAVENOUS | Status: AC
  Administered 2020-02-06: 1250 mg via INTRAVENOUS
  Filled 2020-02-02: qty 250

## 2020-02-02 MED ORDER — TRANEXAMIC ACID (OHS) PUMP PRIME SOLUTION
2.0000 mg/kg | INTRAVENOUS | Status: DC
  Filled 2020-02-02: qty 1.1

## 2020-02-02 MED ORDER — SODIUM CHLORIDE 0.9 % IV SOLN
750.0000 mg | INTRAVENOUS | Status: DC
  Filled 2020-02-02: qty 750

## 2020-02-02 MED ORDER — PHENYLEPHRINE HCL-NACL 20-0.9 MG/250ML-% IV SOLN
30.0000 ug/min | INTRAVENOUS | Status: DC
  Filled 2020-02-02: qty 250

## 2020-02-02 MED ORDER — TRANEXAMIC ACID 1000 MG/10ML IV SOLN
1.5000 mg/kg/h | INTRAVENOUS | Status: DC
  Filled 2020-02-02: qty 25

## 2020-02-02 MED ORDER — MILRINONE LACTATE IN DEXTROSE 20-5 MG/100ML-% IV SOLN
0.3000 ug/kg/min | INTRAVENOUS | Status: DC
  Filled 2020-02-02: qty 100

## 2020-02-02 MED ORDER — DEXMEDETOMIDINE HCL IN NACL 400 MCG/100ML IV SOLN
0.1000 ug/kg/h | INTRAVENOUS | Status: AC
  Administered 2020-02-06: .3 ug/kg/h via INTRAVENOUS
  Filled 2020-02-02: qty 100

## 2020-02-02 MED ORDER — PLASMA-LYTE 148 IV SOLN
INTRAVENOUS | Status: DC
  Filled 2020-02-02: qty 2.5

## 2020-02-02 MED ORDER — EPINEPHRINE HCL 5 MG/250ML IV SOLN IN NS
0.0000 ug/min | INTRAVENOUS | Status: DC
  Filled 2020-02-02: qty 250

## 2020-02-02 MED ORDER — SODIUM CHLORIDE 0.9 % IV SOLN
1.5000 g | INTRAVENOUS | Status: AC
  Administered 2020-02-06: 1.5 g via INTRAVENOUS
  Administered 2020-02-06: .75 g via INTRAVENOUS
  Filled 2020-02-02: qty 1.5

## 2020-02-02 MED ORDER — SODIUM CHLORIDE 0.9 % IV SOLN
INTRAVENOUS | Status: DC
  Filled 2020-02-02: qty 30

## 2020-02-02 MED ORDER — POTASSIUM CHLORIDE 2 MEQ/ML IV SOLN
80.0000 meq | INTRAVENOUS | Status: DC
  Filled 2020-02-02: qty 40

## 2020-02-02 MED ORDER — NITROGLYCERIN IN D5W 200-5 MCG/ML-% IV SOLN
2.0000 ug/min | INTRAVENOUS | Status: AC
  Administered 2020-02-06: 5 ug/min via INTRAVENOUS
  Filled 2020-02-02: qty 250

## 2020-02-02 MED ORDER — ALBUTEROL SULFATE (2.5 MG/3ML) 0.083% IN NEBU
2.5000 mg | INHALATION_SOLUTION | Freq: Once | RESPIRATORY_TRACT | Status: AC
  Administered 2020-02-02: 2.5 mg via RESPIRATORY_TRACT

## 2020-02-02 NOTE — Progress Notes (Signed)
Pre cabg has been completed.   Preliminary results in CV Proc.   Blanch Media 02/02/2020 2:55 PM

## 2020-02-02 NOTE — Progress Notes (Signed)
PCP - Orlinda Blalock   [facility] Cardiologist - DR [facility]   -  Chest x-ray - FOR TODAY  EKG - FOR TODAY Stress Test -4/21  ECHO - 5/21 Cardiac Cath - 5/21  Sleep Study - NO CPAP - NO  Blood Thinner Instructions:NONE Aspirin Instructions:STOP   COVID TEST- FOR TODAY  Anesthesia review: HEART HX      48 YR SMOKER Patient denies shortness of breath, fever, cough and chest pain at PAT appointment   All instructions explained to the patient, with a verbal understanding of the material. Patient agrees to go over the instructions while at home for a better understanding. Patient also instructed to self quarantine after being tested for COVID-19. The opportunity to ask questions was provided.

## 2020-02-06 ENCOUNTER — Other Ambulatory Visit: Payer: Self-pay

## 2020-02-06 ENCOUNTER — Inpatient Hospital Stay (HOSPITAL_COMMUNITY): Payer: Medicare HMO

## 2020-02-06 ENCOUNTER — Inpatient Hospital Stay (HOSPITAL_COMMUNITY): Payer: Medicare HMO | Admitting: Emergency Medicine

## 2020-02-06 ENCOUNTER — Inpatient Hospital Stay (HOSPITAL_COMMUNITY)
Admission: RE | Disposition: A | Discharge status: Other Acute Inpatient Hospital | Payer: Self-pay | Source: Ambulatory Visit | Attending: Cardiothoracic Surgery

## 2020-02-06 ENCOUNTER — Inpatient Hospital Stay (HOSPITAL_COMMUNITY)
Admission: RE | Admit: 2020-02-06 | Discharge: 2020-03-04 | DRG: 003 | Disposition: A | Discharge status: Other Acute Inpatient Hospital | Payer: Medicare HMO | Source: Ambulatory Visit | Attending: Cardiothoracic Surgery | Admitting: Cardiothoracic Surgery

## 2020-02-06 ENCOUNTER — Ambulatory Visit: Payer: Medicare HMO | Admitting: Cardiology

## 2020-02-06 ENCOUNTER — Encounter (HOSPITAL_COMMUNITY): Payer: Self-pay | Admitting: Cardiothoracic Surgery

## 2020-02-06 ENCOUNTER — Inpatient Hospital Stay (HOSPITAL_COMMUNITY): Payer: Medicare HMO | Admitting: Certified Registered"

## 2020-02-06 DIAGNOSIS — I361 Nonrheumatic tricuspid (valve) insufficiency: Secondary | ICD-10-CM | POA: Diagnosis not present

## 2020-02-06 DIAGNOSIS — Z9689 Presence of other specified functional implants: Secondary | ICD-10-CM

## 2020-02-06 DIAGNOSIS — D62 Acute posthemorrhagic anemia: Secondary | ICD-10-CM | POA: Diagnosis not present

## 2020-02-06 DIAGNOSIS — E162 Hypoglycemia, unspecified: Secondary | ICD-10-CM | POA: Diagnosis not present

## 2020-02-06 DIAGNOSIS — J449 Chronic obstructive pulmonary disease, unspecified: Secondary | ICD-10-CM | POA: Diagnosis not present

## 2020-02-06 DIAGNOSIS — J9601 Acute respiratory failure with hypoxia: Secondary | ICD-10-CM | POA: Diagnosis not present

## 2020-02-06 DIAGNOSIS — J9600 Acute respiratory failure, unspecified whether with hypoxia or hypercapnia: Secondary | ICD-10-CM

## 2020-02-06 DIAGNOSIS — R578 Other shock: Secondary | ICD-10-CM | POA: Diagnosis not present

## 2020-02-06 DIAGNOSIS — Z09 Encounter for follow-up examination after completed treatment for conditions other than malignant neoplasm: Secondary | ICD-10-CM

## 2020-02-06 DIAGNOSIS — R579 Shock, unspecified: Secondary | ICD-10-CM | POA: Diagnosis not present

## 2020-02-06 DIAGNOSIS — I10 Essential (primary) hypertension: Secondary | ICD-10-CM | POA: Diagnosis present

## 2020-02-06 DIAGNOSIS — R34 Anuria and oliguria: Secondary | ICD-10-CM | POA: Diagnosis not present

## 2020-02-06 DIAGNOSIS — S225XXA Flail chest, initial encounter for closed fracture: Secondary | ICD-10-CM | POA: Diagnosis not present

## 2020-02-06 DIAGNOSIS — I469 Cardiac arrest, cause unspecified: Secondary | ICD-10-CM | POA: Diagnosis not present

## 2020-02-06 DIAGNOSIS — R918 Other nonspecific abnormal finding of lung field: Secondary | ICD-10-CM | POA: Diagnosis not present

## 2020-02-06 DIAGNOSIS — Z7982 Long term (current) use of aspirin: Secondary | ICD-10-CM

## 2020-02-06 DIAGNOSIS — G9341 Metabolic encephalopathy: Secondary | ICD-10-CM | POA: Diagnosis not present

## 2020-02-06 DIAGNOSIS — J9690 Respiratory failure, unspecified, unspecified whether with hypoxia or hypercapnia: Secondary | ICD-10-CM

## 2020-02-06 DIAGNOSIS — J441 Chronic obstructive pulmonary disease with (acute) exacerbation: Secondary | ICD-10-CM

## 2020-02-06 DIAGNOSIS — Z951 Presence of aortocoronary bypass graft: Secondary | ICD-10-CM | POA: Diagnosis not present

## 2020-02-06 DIAGNOSIS — Z4682 Encounter for fitting and adjustment of non-vascular catheter: Secondary | ICD-10-CM | POA: Diagnosis not present

## 2020-02-06 DIAGNOSIS — J9501 Hemorrhage from tracheostomy stoma: Secondary | ICD-10-CM | POA: Diagnosis not present

## 2020-02-06 DIAGNOSIS — J690 Pneumonitis due to inhalation of food and vomit: Secondary | ICD-10-CM | POA: Diagnosis not present

## 2020-02-06 DIAGNOSIS — J8410 Pulmonary fibrosis, unspecified: Secondary | ICD-10-CM | POA: Diagnosis not present

## 2020-02-06 DIAGNOSIS — J9621 Acute and chronic respiratory failure with hypoxia: Secondary | ICD-10-CM | POA: Diagnosis not present

## 2020-02-06 DIAGNOSIS — J439 Emphysema, unspecified: Secondary | ICD-10-CM | POA: Diagnosis not present

## 2020-02-06 DIAGNOSIS — I6522 Occlusion and stenosis of left carotid artery: Secondary | ICD-10-CM | POA: Diagnosis not present

## 2020-02-06 DIAGNOSIS — J939 Pneumothorax, unspecified: Secondary | ICD-10-CM | POA: Diagnosis not present

## 2020-02-06 DIAGNOSIS — J9602 Acute respiratory failure with hypercapnia: Secondary | ICD-10-CM | POA: Diagnosis not present

## 2020-02-06 DIAGNOSIS — J9382 Other air leak: Secondary | ICD-10-CM | POA: Diagnosis not present

## 2020-02-06 DIAGNOSIS — J984 Other disorders of lung: Secondary | ICD-10-CM | POA: Diagnosis not present

## 2020-02-06 DIAGNOSIS — I4891 Unspecified atrial fibrillation: Secondary | ICD-10-CM | POA: Diagnosis not present

## 2020-02-06 DIAGNOSIS — R270 Ataxia, unspecified: Secondary | ICD-10-CM | POA: Diagnosis not present

## 2020-02-06 DIAGNOSIS — I462 Cardiac arrest due to underlying cardiac condition: Secondary | ICD-10-CM | POA: Diagnosis not present

## 2020-02-06 DIAGNOSIS — J90 Pleural effusion, not elsewhere classified: Secondary | ICD-10-CM | POA: Diagnosis not present

## 2020-02-06 DIAGNOSIS — F10239 Alcohol dependence with withdrawal, unspecified: Secondary | ICD-10-CM | POA: Diagnosis not present

## 2020-02-06 DIAGNOSIS — E46 Unspecified protein-calorie malnutrition: Secondary | ICD-10-CM | POA: Diagnosis present

## 2020-02-06 DIAGNOSIS — Z781 Physical restraint status: Secondary | ICD-10-CM

## 2020-02-06 DIAGNOSIS — R490 Dysphonia: Secondary | ICD-10-CM | POA: Diagnosis not present

## 2020-02-06 DIAGNOSIS — F419 Anxiety disorder, unspecified: Secondary | ICD-10-CM | POA: Diagnosis present

## 2020-02-06 DIAGNOSIS — R739 Hyperglycemia, unspecified: Secondary | ICD-10-CM | POA: Diagnosis not present

## 2020-02-06 DIAGNOSIS — X58XXXA Exposure to other specified factors, initial encounter: Secondary | ICD-10-CM | POA: Diagnosis not present

## 2020-02-06 DIAGNOSIS — Z8673 Personal history of transient ischemic attack (TIA), and cerebral infarction without residual deficits: Secondary | ICD-10-CM

## 2020-02-06 DIAGNOSIS — R0602 Shortness of breath: Secondary | ICD-10-CM | POA: Diagnosis not present

## 2020-02-06 DIAGNOSIS — Z978 Presence of other specified devices: Secondary | ICD-10-CM

## 2020-02-06 DIAGNOSIS — Z20822 Contact with and (suspected) exposure to covid-19: Secondary | ICD-10-CM | POA: Diagnosis present

## 2020-02-06 DIAGNOSIS — Z9889 Other specified postprocedural states: Secondary | ICD-10-CM

## 2020-02-06 DIAGNOSIS — Z79899 Other long term (current) drug therapy: Secondary | ICD-10-CM

## 2020-02-06 DIAGNOSIS — L89150 Pressure ulcer of sacral region, unstageable: Secondary | ICD-10-CM | POA: Diagnosis not present

## 2020-02-06 DIAGNOSIS — G4700 Insomnia, unspecified: Secondary | ICD-10-CM | POA: Diagnosis not present

## 2020-02-06 DIAGNOSIS — T17590A Other foreign object in bronchus causing asphyxiation, initial encounter: Secondary | ICD-10-CM | POA: Diagnosis not present

## 2020-02-06 DIAGNOSIS — R64 Cachexia: Secondary | ICD-10-CM | POA: Diagnosis present

## 2020-02-06 DIAGNOSIS — R042 Hemoptysis: Secondary | ICD-10-CM | POA: Diagnosis not present

## 2020-02-06 DIAGNOSIS — Z7189 Other specified counseling: Secondary | ICD-10-CM

## 2020-02-06 DIAGNOSIS — R0902 Hypoxemia: Secondary | ICD-10-CM | POA: Diagnosis not present

## 2020-02-06 DIAGNOSIS — J189 Pneumonia, unspecified organism: Secondary | ICD-10-CM | POA: Diagnosis not present

## 2020-02-06 DIAGNOSIS — B952 Enterococcus as the cause of diseases classified elsewhere: Secondary | ICD-10-CM | POA: Diagnosis not present

## 2020-02-06 DIAGNOSIS — I2510 Atherosclerotic heart disease of native coronary artery without angina pectoris: Secondary | ICD-10-CM | POA: Diagnosis not present

## 2020-02-06 DIAGNOSIS — Z9911 Dependence on respirator [ventilator] status: Secondary | ICD-10-CM

## 2020-02-06 DIAGNOSIS — J9383 Other pneumothorax: Secondary | ICD-10-CM | POA: Diagnosis not present

## 2020-02-06 DIAGNOSIS — Y95 Nosocomial condition: Secondary | ICD-10-CM | POA: Diagnosis not present

## 2020-02-06 DIAGNOSIS — J181 Lobar pneumonia, unspecified organism: Secondary | ICD-10-CM | POA: Diagnosis not present

## 2020-02-06 DIAGNOSIS — E43 Unspecified severe protein-calorie malnutrition: Secondary | ICD-10-CM | POA: Diagnosis not present

## 2020-02-06 DIAGNOSIS — R339 Retention of urine, unspecified: Secondary | ICD-10-CM | POA: Diagnosis not present

## 2020-02-06 DIAGNOSIS — Z981 Arthrodesis status: Secondary | ICD-10-CM

## 2020-02-06 DIAGNOSIS — J9811 Atelectasis: Secondary | ICD-10-CM | POA: Diagnosis not present

## 2020-02-06 DIAGNOSIS — R627 Adult failure to thrive: Secondary | ICD-10-CM | POA: Diagnosis not present

## 2020-02-06 DIAGNOSIS — Y838 Other surgical procedures as the cause of abnormal reaction of the patient, or of later complication, without mention of misadventure at the time of the procedure: Secondary | ICD-10-CM | POA: Diagnosis not present

## 2020-02-06 DIAGNOSIS — I081 Rheumatic disorders of both mitral and tricuspid valves: Secondary | ICD-10-CM | POA: Diagnosis not present

## 2020-02-06 DIAGNOSIS — D6959 Other secondary thrombocytopenia: Secondary | ICD-10-CM | POA: Diagnosis not present

## 2020-02-06 DIAGNOSIS — Z0189 Encounter for other specified special examinations: Secondary | ICD-10-CM

## 2020-02-06 DIAGNOSIS — R54 Age-related physical debility: Secondary | ICD-10-CM | POA: Diagnosis present

## 2020-02-06 DIAGNOSIS — I517 Cardiomegaly: Secondary | ICD-10-CM | POA: Diagnosis not present

## 2020-02-06 DIAGNOSIS — L8990 Pressure ulcer of unspecified site, unspecified stage: Secondary | ICD-10-CM | POA: Insufficient documentation

## 2020-02-06 DIAGNOSIS — E876 Hypokalemia: Secondary | ICD-10-CM | POA: Diagnosis not present

## 2020-02-06 DIAGNOSIS — Z930 Tracheostomy status: Secondary | ICD-10-CM | POA: Diagnosis not present

## 2020-02-06 DIAGNOSIS — I340 Nonrheumatic mitral (valve) insufficiency: Secondary | ICD-10-CM | POA: Diagnosis not present

## 2020-02-06 DIAGNOSIS — R451 Restlessness and agitation: Secondary | ICD-10-CM | POA: Diagnosis not present

## 2020-02-06 DIAGNOSIS — F17210 Nicotine dependence, cigarettes, uncomplicated: Secondary | ICD-10-CM | POA: Diagnosis present

## 2020-02-06 DIAGNOSIS — Z515 Encounter for palliative care: Secondary | ICD-10-CM | POA: Diagnosis not present

## 2020-02-06 DIAGNOSIS — L89159 Pressure ulcer of sacral region, unspecified stage: Secondary | ICD-10-CM | POA: Diagnosis not present

## 2020-02-06 DIAGNOSIS — T17908S Unspecified foreign body in respiratory tract, part unspecified causing other injury, sequela: Secondary | ICD-10-CM | POA: Diagnosis not present

## 2020-02-06 DIAGNOSIS — I314 Cardiac tamponade: Secondary | ICD-10-CM | POA: Diagnosis not present

## 2020-02-06 DIAGNOSIS — R079 Chest pain, unspecified: Secondary | ICD-10-CM | POA: Diagnosis not present

## 2020-02-06 DIAGNOSIS — Z79891 Long term (current) use of opiate analgesic: Secondary | ICD-10-CM

## 2020-02-06 DIAGNOSIS — Z681 Body mass index (BMI) 19 or less, adult: Secondary | ICD-10-CM | POA: Diagnosis not present

## 2020-02-06 HISTORY — PX: TEE WITHOUT CARDIOVERSION: SHX5443

## 2020-02-06 HISTORY — PX: ENDARTERECTOMY: SHX5162

## 2020-02-06 HISTORY — PX: CORONARY ARTERY BYPASS GRAFT: SHX141

## 2020-02-06 LAB — POCT I-STAT, CHEM 8
BUN: 5 mg/dL — ABNORMAL LOW (ref 8–23)
BUN: 3 mg/dL — ABNORMAL LOW (ref 8–23)
BUN: 5 mg/dL — ABNORMAL LOW (ref 8–23)
BUN: 4 mg/dL — ABNORMAL LOW (ref 8–23)
BUN: 4 mg/dL — ABNORMAL LOW (ref 8–23)
Calcium, Ion: 1.25 mmol/L (ref 1.15–1.40)
Calcium, Ion: 0.94 mmol/L — ABNORMAL LOW (ref 1.15–1.40)
Calcium, Ion: 1.31 mmol/L (ref 1.15–1.40)
Calcium, Ion: 1.05 mmol/L — ABNORMAL LOW (ref 1.15–1.40)
Calcium, Ion: 1.03 mmol/L — ABNORMAL LOW (ref 1.15–1.40)
Chloride: 101 mmol/L (ref 98–111)
Chloride: 100 mmol/L (ref 98–111)
Chloride: 104 mmol/L (ref 98–111)
Chloride: 104 mmol/L (ref 98–111)
Chloride: 104 mmol/L (ref 98–111)
Creatinine, Ser: 0.6 mg/dL — ABNORMAL LOW (ref 0.61–1.24)
Creatinine, Ser: 0.5 mg/dL — ABNORMAL LOW (ref 0.61–1.24)
Creatinine, Ser: 0.6 mg/dL — ABNORMAL LOW (ref 0.61–1.24)
Creatinine, Ser: 0.6 mg/dL — ABNORMAL LOW (ref 0.61–1.24)
Creatinine, Ser: 0.5 mg/dL — ABNORMAL LOW (ref 0.61–1.24)
Glucose, Bld: 102 mg/dL — ABNORMAL HIGH (ref 70–99)
Glucose, Bld: 119 mg/dL — ABNORMAL HIGH (ref 70–99)
Glucose, Bld: 145 mg/dL — ABNORMAL HIGH (ref 70–99)
Glucose, Bld: 123 mg/dL — ABNORMAL HIGH (ref 70–99)
Glucose, Bld: 105 mg/dL — ABNORMAL HIGH (ref 70–99)
HCT: 45 % (ref 39.0–52.0)
HCT: 31 % — ABNORMAL LOW (ref 39.0–52.0)
HCT: 44 % (ref 39.0–52.0)
HCT: 27 % — ABNORMAL LOW (ref 39.0–52.0)
HCT: 24 % — ABNORMAL LOW (ref 39.0–52.0)
Hemoglobin: 15.3 g/dL (ref 13.0–17.0)
Hemoglobin: 10.5 g/dL — ABNORMAL LOW (ref 13.0–17.0)
Hemoglobin: 15 g/dL (ref 13.0–17.0)
Hemoglobin: 9.2 g/dL — ABNORMAL LOW (ref 13.0–17.0)
Hemoglobin: 8.2 g/dL — ABNORMAL LOW (ref 13.0–17.0)
Potassium: 3.9 mmol/L (ref 3.5–5.1)
Potassium: 3.8 mmol/L (ref 3.5–5.1)
Potassium: 4.2 mmol/L (ref 3.5–5.1)
Potassium: 5.1 mmol/L (ref 3.5–5.1)
Potassium: 4.5 mmol/L (ref 3.5–5.1)
Sodium: 138 mmol/L (ref 135–145)
Sodium: 142 mmol/L (ref 135–145)
Sodium: 143 mmol/L (ref 135–145)
Sodium: 141 mmol/L (ref 135–145)
Sodium: 142 mmol/L (ref 135–145)
TCO2: 32 mmol/L (ref 22–32)
TCO2: 31 mmol/L (ref 22–32)
TCO2: 34 mmol/L — ABNORMAL HIGH (ref 22–32)
TCO2: 30 mmol/L (ref 22–32)
TCO2: 31 mmol/L (ref 22–32)

## 2020-02-06 LAB — POCT I-STAT 7, (LYTES, BLD GAS, ICA,H+H)
Acid-Base Excess: 2 mmol/L (ref 0.0–2.0)
Acid-Base Excess: 2 mmol/L (ref 0.0–2.0)
Acid-Base Excess: 4 mmol/L — ABNORMAL HIGH (ref 0.0–2.0)
Acid-Base Excess: 4 mmol/L — ABNORMAL HIGH (ref 0.0–2.0)
Acid-Base Excess: 2 mmol/L (ref 0.0–2.0)
Acid-base deficit: 1 mmol/L (ref 0.0–2.0)
Acid-base deficit: 3 mmol/L — ABNORMAL HIGH (ref 0.0–2.0)
Bicarbonate: 29.7 mmol/L — ABNORMAL HIGH (ref 20.0–28.0)
Bicarbonate: 28.9 mmol/L — ABNORMAL HIGH (ref 20.0–28.0)
Bicarbonate: 29 mmol/L — ABNORMAL HIGH (ref 20.0–28.0)
Bicarbonate: 28.5 mmol/L — ABNORMAL HIGH (ref 20.0–28.0)
Bicarbonate: 27.5 mmol/L (ref 20.0–28.0)
Bicarbonate: 24.7 mmol/L (ref 20.0–28.0)
Bicarbonate: 23 mmol/L (ref 20.0–28.0)
Calcium, Ion: 1.27 mmol/L (ref 1.15–1.40)
Calcium, Ion: 0.93 mmol/L — ABNORMAL LOW (ref 1.15–1.40)
Calcium, Ion: 1.04 mmol/L — ABNORMAL LOW (ref 1.15–1.40)
Calcium, Ion: 1.02 mmol/L — ABNORMAL LOW (ref 1.15–1.40)
Calcium, Ion: 1.07 mmol/L — ABNORMAL LOW (ref 1.15–1.40)
Calcium, Ion: 1.14 mmol/L — ABNORMAL LOW (ref 1.15–1.40)
Calcium, Ion: 1.15 mmol/L (ref 1.15–1.40)
HCT: 45 % (ref 39.0–52.0)
HCT: 30 % — ABNORMAL LOW (ref 39.0–52.0)
HCT: 24 % — ABNORMAL LOW (ref 39.0–52.0)
HCT: 23 % — ABNORMAL LOW (ref 39.0–52.0)
HCT: 24 % — ABNORMAL LOW (ref 39.0–52.0)
HCT: 20 % — ABNORMAL LOW (ref 39.0–52.0)
HCT: 18 % — ABNORMAL LOW (ref 39.0–52.0)
Hemoglobin: 15.3 g/dL (ref 13.0–17.0)
Hemoglobin: 10.2 g/dL — ABNORMAL LOW (ref 13.0–17.0)
Hemoglobin: 8.2 g/dL — ABNORMAL LOW (ref 13.0–17.0)
Hemoglobin: 7.8 g/dL — ABNORMAL LOW (ref 13.0–17.0)
Hemoglobin: 8.2 g/dL — ABNORMAL LOW (ref 13.0–17.0)
Hemoglobin: 6.8 g/dL — CL (ref 13.0–17.0)
Hemoglobin: 6.1 g/dL — CL (ref 13.0–17.0)
O2 Saturation: 100 %
O2 Saturation: 100 %
O2 Saturation: 100 %
O2 Saturation: 100 %
O2 Saturation: 99 %
O2 Saturation: 99 %
O2 Saturation: 96 %
Patient temperature: 36.3
Patient temperature: 36.2
Patient temperature: 36.2
Potassium: 3.8 mmol/L (ref 3.5–5.1)
Potassium: 4.2 mmol/L (ref 3.5–5.1)
Potassium: 5.2 mmol/L — ABNORMAL HIGH (ref 3.5–5.1)
Potassium: 4.5 mmol/L (ref 3.5–5.1)
Potassium: 3.8 mmol/L (ref 3.5–5.1)
Potassium: 4.2 mmol/L (ref 3.5–5.1)
Potassium: 4.1 mmol/L (ref 3.5–5.1)
Sodium: 139 mmol/L (ref 135–145)
Sodium: 144 mmol/L (ref 135–145)
Sodium: 143 mmol/L (ref 135–145)
Sodium: 143 mmol/L (ref 135–145)
Sodium: 147 mmol/L — ABNORMAL HIGH (ref 135–145)
Sodium: 144 mmol/L (ref 135–145)
Sodium: 144 mmol/L (ref 135–145)
TCO2: 31 mmol/L (ref 22–32)
TCO2: 31 mmol/L (ref 22–32)
TCO2: 30 mmol/L (ref 22–32)
TCO2: 30 mmol/L (ref 22–32)
TCO2: 29 mmol/L (ref 22–32)
TCO2: 26 mmol/L (ref 22–32)
TCO2: 24 mmol/L (ref 22–32)
pCO2 arterial: 55 mmHg — ABNORMAL HIGH (ref 32.0–48.0)
pCO2 arterial: 55.8 mmHg — ABNORMAL HIGH (ref 32.0–48.0)
pCO2 arterial: 42.8 mmHg (ref 32.0–48.0)
pCO2 arterial: 43.1 mmHg (ref 32.0–48.0)
pCO2 arterial: 43.8 mmHg (ref 32.0–48.0)
pCO2 arterial: 42.6 mmHg (ref 32.0–48.0)
pCO2 arterial: 43.4 mmHg (ref 32.0–48.0)
pH, Arterial: 7.339 — ABNORMAL LOW (ref 7.350–7.450)
pH, Arterial: 7.321 — ABNORMAL LOW (ref 7.350–7.450)
pH, Arterial: 7.439 (ref 7.350–7.450)
pH, Arterial: 7.428 (ref 7.350–7.450)
pH, Arterial: 7.403 (ref 7.350–7.450)
pH, Arterial: 7.367 (ref 7.350–7.450)
pH, Arterial: 7.329 — ABNORMAL LOW (ref 7.350–7.450)
pO2, Arterial: 567 mmHg — ABNORMAL HIGH (ref 83.0–108.0)
pO2, Arterial: 433 mmHg — ABNORMAL HIGH (ref 83.0–108.0)
pO2, Arterial: 347 mmHg — ABNORMAL HIGH (ref 83.0–108.0)
pO2, Arterial: 358 mmHg — ABNORMAL HIGH (ref 83.0–108.0)
pO2, Arterial: 150 mmHg — ABNORMAL HIGH (ref 83.0–108.0)
pO2, Arterial: 131 mmHg — ABNORMAL HIGH (ref 83.0–108.0)
pO2, Arterial: 86 mmHg (ref 83.0–108.0)

## 2020-02-06 LAB — CBC
HCT: 29 % — ABNORMAL LOW (ref 39.0–52.0)
HCT: 23.3 % — ABNORMAL LOW (ref 39.0–52.0)
HCT: 22.3 % — ABNORMAL LOW (ref 39.0–52.0)
Hemoglobin: 9.6 g/dL — ABNORMAL LOW (ref 13.0–17.0)
Hemoglobin: 7.6 g/dL — ABNORMAL LOW (ref 13.0–17.0)
Hemoglobin: 7.3 g/dL — ABNORMAL LOW (ref 13.0–17.0)
MCH: 32.5 pg (ref 26.0–34.0)
MCH: 32.6 pg (ref 26.0–34.0)
MCH: 32.6 pg (ref 26.0–34.0)
MCHC: 33.1 g/dL (ref 30.0–36.0)
MCHC: 32.6 g/dL (ref 30.0–36.0)
MCHC: 32.7 g/dL (ref 30.0–36.0)
MCV: 98.3 fL (ref 80.0–100.0)
MCV: 100 fL (ref 80.0–100.0)
MCV: 99.6 fL (ref 80.0–100.0)
Platelets: 89 10*3/uL — ABNORMAL LOW (ref 150–400)
Platelets: 85 10*3/uL — ABNORMAL LOW (ref 150–400)
Platelets: 92 10*3/uL — ABNORMAL LOW (ref 150–400)
RBC: 2.95 MIL/uL — ABNORMAL LOW (ref 4.22–5.81)
RBC: 2.33 MIL/uL — ABNORMAL LOW (ref 4.22–5.81)
RBC: 2.24 MIL/uL — ABNORMAL LOW (ref 4.22–5.81)
RDW: 12.8 % (ref 11.5–15.5)
RDW: 13 % (ref 11.5–15.5)
RDW: 13.1 % (ref 11.5–15.5)
WBC: 3.4 10*3/uL — ABNORMAL LOW (ref 4.0–10.5)
WBC: 4.4 10*3/uL (ref 4.0–10.5)
WBC: 5.4 10*3/uL (ref 4.0–10.5)
nRBC: 0 % (ref 0.0–0.2)
nRBC: 0 % (ref 0.0–0.2)
nRBC: 0 % (ref 0.0–0.2)

## 2020-02-06 LAB — GLUCOSE, CAPILLARY
Glucose-Capillary: 86 mg/dL (ref 70–99)
Glucose-Capillary: 93 mg/dL (ref 70–99)
Glucose-Capillary: 121 mg/dL — ABNORMAL HIGH (ref 70–99)
Glucose-Capillary: 123 mg/dL — ABNORMAL HIGH (ref 70–99)
Glucose-Capillary: 114 mg/dL — ABNORMAL HIGH (ref 70–99)
Glucose-Capillary: 122 mg/dL — ABNORMAL HIGH (ref 70–99)
Glucose-Capillary: 185 mg/dL — ABNORMAL HIGH (ref 70–99)

## 2020-02-06 LAB — PROTIME-INR
INR: 1.8 — ABNORMAL HIGH (ref 0.8–1.2)
Prothrombin Time: 20.3 seconds — ABNORMAL HIGH (ref 11.4–15.2)

## 2020-02-06 LAB — BASIC METABOLIC PANEL
Anion gap: 8 (ref 5–15)
BUN: 7 mg/dL — ABNORMAL LOW (ref 8–23)
CO2: 23 mmol/L (ref 22–32)
Calcium: 7.2 mg/dL — ABNORMAL LOW (ref 8.9–10.3)
Chloride: 111 mmol/L (ref 98–111)
Creatinine, Ser: 0.78 mg/dL (ref 0.61–1.24)
GFR calc Af Amer: >60 mL/min (ref >60)
GFR calc non Af Amer: >60 mL/min (ref >60)
Glucose, Bld: 118 mg/dL — ABNORMAL HIGH (ref 70–99)
Potassium: 4.2 mmol/L (ref 3.5–5.1)
Sodium: 142 mmol/L (ref 135–145)

## 2020-02-06 LAB — MAGNESIUM: Magnesium: 3.3 mg/dL — ABNORMAL HIGH (ref 1.7–2.4)

## 2020-02-06 LAB — HEMOGLOBIN AND HEMATOCRIT, BLOOD
HCT: 26.2 % — ABNORMAL LOW (ref 39.0–52.0)
Hemoglobin: 8.7 g/dL — ABNORMAL LOW (ref 13.0–17.0)

## 2020-02-06 LAB — APTT: aPTT: 41 seconds — ABNORMAL HIGH (ref 24–36)

## 2020-02-06 LAB — PLATELET COUNT: Platelets: 59 10*3/uL — ABNORMAL LOW (ref 150–400)

## 2020-02-06 SURGERY — CORONARY ARTERY BYPASS GRAFTING (CABG)
Anesthesia: General | Site: Neck

## 2020-02-06 MED ORDER — SODIUM CHLORIDE 0.45 % IV SOLN: INTRAVENOUS | Status: DC | PRN

## 2020-02-06 MED ORDER — PHENYLEPHRINE HCL-NACL 20-0.9 MG/250ML-% IV SOLN
0.0000 ug/min | INTRAVENOUS | Status: DC
  Administered 2020-02-06: 35 ug/min via INTRAVENOUS
  Administered 2020-02-08: 45 ug/min via INTRAVENOUS
  Filled 2020-02-06 (×4): qty 250

## 2020-02-06 MED ORDER — METOPROLOL TARTRATE 12.5 MG HALF TABLET
12.5000 mg | ORAL_TABLET | Freq: Two times a day (BID) | ORAL | Status: DC
  Administered 2020-02-07 – 2020-02-10 (×6): 12.5 mg via ORAL
  Filled 2020-02-06 (×6): qty 1

## 2020-02-06 MED ORDER — METOPROLOL TARTRATE 12.5 MG HALF TABLET
12.5000 mg | ORAL_TABLET | Freq: Once | ORAL | Status: AC
  Administered 2020-02-06: 12.5 mg via ORAL
  Filled 2020-02-06: qty 1

## 2020-02-06 MED ORDER — ROCURONIUM BROMIDE 100 MG/10ML IV SOLN
INTRAVENOUS | Status: DC | PRN
  Administered 2020-02-06: 30 mg via INTRAVENOUS
  Administered 2020-02-06: 60 mg via INTRAVENOUS
  Administered 2020-02-06: 30 mg via INTRAVENOUS
  Administered 2020-02-06: 50 mg via INTRAVENOUS
  Administered 2020-02-06: 40 mg via INTRAVENOUS
  Administered 2020-02-06: 30 mg via INTRAVENOUS

## 2020-02-06 MED ORDER — INSULIN REGULAR(HUMAN) IN NACL 100-0.9 UT/100ML-% IV SOLN: INTRAVENOUS | Status: DC

## 2020-02-06 MED ORDER — SODIUM CHLORIDE 0.9% FLUSH
3.0000 mL | Freq: Two times a day (BID) | INTRAVENOUS | Status: DC
  Administered 2020-02-07 – 2020-03-03 (×33): 3 mL via INTRAVENOUS

## 2020-02-06 MED ORDER — GLYCOPYRROLATE 0.2 MG/ML IJ SOLN
INTRAMUSCULAR | Status: DC | PRN
Start: 2020-02-06 — End: 2020-02-06
  Administered 2020-02-06: .2 mg via INTRAVENOUS

## 2020-02-06 MED ORDER — SODIUM CHLORIDE (PF) 0.9 % IJ SOLN
OROMUCOSAL | Status: DC | PRN
  Administered 2020-02-06 (×3): 4 mL via TOPICAL

## 2020-02-06 MED ORDER — ONDANSETRON HCL 4 MG/2ML IJ SOLN
INTRAMUSCULAR | Status: AC
  Filled 2020-02-06: qty 2

## 2020-02-06 MED ORDER — LACTATED RINGERS IV SOLN: INTRAVENOUS | Status: DC

## 2020-02-06 MED ORDER — METOPROLOL TARTRATE 5 MG/5ML IV SOLN
2.5000 mg | INTRAVENOUS | Status: DC | PRN
  Administered 2020-02-10: 5 mg via INTRAVENOUS
  Filled 2020-02-06: qty 5

## 2020-02-06 MED ORDER — ASPIRIN EC 325 MG PO TBEC: 325.0000 mg | DELAYED_RELEASE_TABLET | Freq: Every day | ORAL | Status: DC

## 2020-02-06 MED ORDER — MIDAZOLAM HCL 2 MG/2ML IJ SOLN: 2.0000 mg | INTRAMUSCULAR | Status: DC | PRN

## 2020-02-06 MED ORDER — 0.9 % SODIUM CHLORIDE (POUR BTL) OPTIME
TOPICAL | Status: DC | PRN
  Administered 2020-02-06: 6000 mL

## 2020-02-06 MED ORDER — BUPIVACAINE HCL 0.5 % IJ SOLN
INTRAMUSCULAR | Status: DC | PRN
  Administered 2020-02-06: 30 mL

## 2020-02-06 MED ORDER — MORPHINE SULFATE (PF) 2 MG/ML IV SOLN
1.0000 mg | INTRAVENOUS | Status: DC | PRN
  Administered 2020-02-06: 2 mg via INTRAVENOUS
  Administered 2020-02-07: 4 mg via INTRAVENOUS
  Administered 2020-02-07 (×2): 2 mg via INTRAVENOUS
  Administered 2020-02-07: 4 mg via INTRAVENOUS
  Administered 2020-02-08 (×2): 2 mg via INTRAVENOUS
  Administered 2020-02-08: 4 mg via INTRAVENOUS
  Administered 2020-02-08: 2 mg via INTRAVENOUS
  Administered 2020-02-08 – 2020-02-09 (×2): 4 mg via INTRAVENOUS
  Filled 2020-02-06 (×2): qty 2
  Filled 2020-02-06 (×3): qty 1
  Filled 2020-02-06: qty 2
  Filled 2020-02-06 (×4): qty 1
  Filled 2020-02-06 (×2): qty 2

## 2020-02-06 MED ORDER — HEPARIN SODIUM (PORCINE) 1000 UNIT/ML IJ SOLN
INTRAMUSCULAR | Status: DC | PRN
  Administered 2020-02-06: 6000 [IU] via INTRAVENOUS
  Administered 2020-02-06: 13000 [IU] via INTRAVENOUS

## 2020-02-06 MED ORDER — DEXMEDETOMIDINE HCL IN NACL 400 MCG/100ML IV SOLN
0.0000 ug/kg/h | INTRAVENOUS | Status: DC
  Administered 2020-02-07: 0.2 ug/kg/h via INTRAVENOUS
  Administered 2020-02-08: 0.4 ug/kg/h via INTRAVENOUS
  Filled 2020-02-06 (×2): qty 100

## 2020-02-06 MED ORDER — ASPIRIN 81 MG PO CHEW: 324.0000 mg | CHEWABLE_TABLET | Freq: Every day | ORAL | Status: DC

## 2020-02-06 MED ORDER — FENTANYL CITRATE (PF) 250 MCG/5ML IJ SOLN
INTRAMUSCULAR | Status: AC
  Filled 2020-02-06: qty 5

## 2020-02-06 MED ORDER — CHLORHEXIDINE GLUCONATE CLOTH 2 % EX PADS
6.0000 | MEDICATED_PAD | Freq: Every day | CUTANEOUS | Status: DC
  Administered 2020-02-06 – 2020-02-23 (×17): 6 via TOPICAL

## 2020-02-06 MED ORDER — ALBUMIN HUMAN 5 % IV SOLN
INTRAVENOUS | Status: DC | PRN
Start: 2020-02-06 — End: 2020-02-06

## 2020-02-06 MED ORDER — PROPOFOL 10 MG/ML IV BOLUS
INTRAVENOUS | Status: DC | PRN
  Administered 2020-02-06: 120 mg via INTRAVENOUS

## 2020-02-06 MED ORDER — POTASSIUM CHLORIDE 10 MEQ/50ML IV SOLN
10.0000 meq | INTRAVENOUS | Status: AC
  Administered 2020-02-06 (×3): 10 meq via INTRAVENOUS

## 2020-02-06 MED ORDER — PLASMA-LYTE 148 IV SOLN
INTRAVENOUS | Status: DC | PRN
  Administered 2020-02-06: 500 mL via INTRAVASCULAR

## 2020-02-06 MED ORDER — HEMOSTATIC AGENTS (NO CHARGE) OPTIME
TOPICAL | Status: DC | PRN
  Administered 2020-02-06 (×3): 1 via TOPICAL

## 2020-02-06 MED ORDER — SODIUM CHLORIDE 0.9 % IV SOLN
INTRAVENOUS | Status: AC
  Filled 2020-02-06: qty 1.2

## 2020-02-06 MED ORDER — BUPIVACAINE LIPOSOME 1.3 % IJ SUSP
INTRAMUSCULAR | Status: DC | PRN
  Administered 2020-02-06: 20 mL

## 2020-02-06 MED ORDER — BUPIVACAINE LIPOSOME 1.3 % IJ SUSP
20.0000 mL | Freq: Once | INTRAMUSCULAR | Status: DC
  Filled 2020-02-06: qty 20

## 2020-02-06 MED ORDER — ONDANSETRON HCL 4 MG/2ML IJ SOLN
INTRAMUSCULAR | Status: DC | PRN
  Administered 2020-02-06: 4 mg via INTRAVENOUS

## 2020-02-06 MED ORDER — VASOPRESSIN 20 UNIT/ML IV SOLN
INTRAVENOUS | Status: AC
  Filled 2020-02-06: qty 1

## 2020-02-06 MED ORDER — ATORVASTATIN CALCIUM 10 MG PO TABS
10.0000 mg | ORAL_TABLET | Freq: Every day | ORAL | Status: DC
  Administered 2020-02-07 – 2020-02-10 (×4): 10 mg via ORAL
  Filled 2020-02-06 (×5): qty 1

## 2020-02-06 MED ORDER — DOCUSATE SODIUM 100 MG PO CAPS
200.0000 mg | ORAL_CAPSULE | Freq: Every day | ORAL | Status: DC
  Administered 2020-02-07 – 2020-02-09 (×3): 200 mg via ORAL
  Filled 2020-02-06 (×4): qty 2

## 2020-02-06 MED ORDER — SODIUM CHLORIDE 0.9 % IV SOLN
INTRAVENOUS | Status: DC | PRN
  Administered 2020-02-06: 500 mL

## 2020-02-06 MED ORDER — CHLORHEXIDINE GLUCONATE 0.12 % MT SOLN
OROMUCOSAL | Status: AC
  Administered 2020-02-06: 15 mL via OROMUCOSAL
  Filled 2020-02-06: qty 15

## 2020-02-06 MED ORDER — SODIUM CHLORIDE 0.9 % IV SOLN
250.0000 mL | INTRAVENOUS | Status: DC
  Administered 2020-02-22: 250 mL via INTRAVENOUS

## 2020-02-06 MED ORDER — PROTAMINE SULFATE 10 MG/ML IV SOLN
INTRAVENOUS | Status: DC | PRN
  Administered 2020-02-06: 10 mg via INTRAVENOUS
  Administered 2020-02-06 (×2): 40 mg via INTRAVENOUS
  Administered 2020-02-06: 20 mg via INTRAVENOUS
  Administered 2020-02-06 (×2): 40 mg via INTRAVENOUS

## 2020-02-06 MED ORDER — BISACODYL 5 MG PO TBEC
10.0000 mg | DELAYED_RELEASE_TABLET | Freq: Every day | ORAL | Status: DC
  Administered 2020-02-07 – 2020-02-13 (×5): 10 mg via ORAL
  Filled 2020-02-06 (×8): qty 2

## 2020-02-06 MED ORDER — VANCOMYCIN HCL 1000 MG IV SOLR
INTRAVENOUS | Status: DC | PRN
  Administered 2020-02-06 (×3): 1000 mg

## 2020-02-06 MED ORDER — CHLORHEXIDINE GLUCONATE 0.12 % MT SOLN: 15.0000 mL | Freq: Once | OROMUCOSAL | Status: AC

## 2020-02-06 MED ORDER — LIDOCAINE 2% (20 MG/ML) 5 ML SYRINGE
INTRAMUSCULAR | Status: AC
  Filled 2020-02-06: qty 5

## 2020-02-06 MED ORDER — ACETAMINOPHEN 650 MG RE SUPP
650.0000 mg | Freq: Once | RECTAL | Status: AC
  Administered 2020-02-06: 650 mg via RECTAL

## 2020-02-06 MED ORDER — DEXTROSE 50 % IV SOLN: 0.0000 mL | INTRAVENOUS | Status: DC | PRN

## 2020-02-06 MED ORDER — NITROGLYCERIN IN D5W 200-5 MCG/ML-% IV SOLN: 0.0000 ug/min | INTRAVENOUS | Status: DC

## 2020-02-06 MED ORDER — ALBUMIN HUMAN 5 % IV SOLN
250.0000 mL | INTRAVENOUS | Status: AC | PRN
  Administered 2020-02-06 (×3): 12.5 g via INTRAVENOUS
  Filled 2020-02-06 (×3): qty 250

## 2020-02-06 MED ORDER — LACTATED RINGERS IV SOLN: INTRAVENOUS | Status: DC | PRN

## 2020-02-06 MED ORDER — DEXAMETHASONE SODIUM PHOSPHATE 10 MG/ML IJ SOLN
INTRAMUSCULAR | Status: AC
  Filled 2020-02-06: qty 1

## 2020-02-06 MED ORDER — CHLORHEXIDINE GLUCONATE 4 % EX LIQD: 30.0000 mL | CUTANEOUS | Status: DC

## 2020-02-06 MED ORDER — SODIUM CHLORIDE 0.9 % IV SOLN
1.5000 g | Freq: Two times a day (BID) | INTRAVENOUS | Status: AC
  Administered 2020-02-06 – 2020-02-08 (×4): 1.5 g via INTRAVENOUS
  Filled 2020-02-06 (×4): qty 1.5

## 2020-02-06 MED ORDER — OXYCODONE HCL 5 MG PO TABS
5.0000 mg | ORAL_TABLET | ORAL | Status: DC | PRN
  Administered 2020-02-07: 10 mg via ORAL
  Administered 2020-02-07: 5 mg via ORAL
  Administered 2020-02-07 – 2020-02-09 (×2): 10 mg via ORAL
  Filled 2020-02-06 (×2): qty 2
  Filled 2020-02-06: qty 1
  Filled 2020-02-06: qty 2

## 2020-02-06 MED ORDER — CHLORHEXIDINE GLUCONATE 4 % EX LIQD: 60.0000 mL | Freq: Once | CUTANEOUS | Status: DC

## 2020-02-06 MED ORDER — VANCOMYCIN HCL IN DEXTROSE 1-5 GM/200ML-% IV SOLN
1000.0000 mg | Freq: Once | INTRAVENOUS | Status: AC
  Administered 2020-02-06: 1000 mg via INTRAVENOUS
  Filled 2020-02-06: qty 200

## 2020-02-06 MED ORDER — ACETAMINOPHEN 160 MG/5ML PO SOLN: 650.0000 mg | Freq: Once | ORAL | Status: AC

## 2020-02-06 MED ORDER — SODIUM CHLORIDE 0.9% FLUSH
3.0000 mL | INTRAVENOUS | Status: DC | PRN
  Administered 2020-02-21 – 2020-02-22 (×2): 3 mL via INTRAVENOUS

## 2020-02-06 MED ORDER — ONDANSETRON HCL 4 MG/2ML IJ SOLN
4.0000 mg | Freq: Four times a day (QID) | INTRAMUSCULAR | Status: DC | PRN
  Administered 2020-03-02: 4 mg via INTRAVENOUS
  Filled 2020-02-06: qty 2

## 2020-02-06 MED ORDER — DEXAMETHASONE SODIUM PHOSPHATE 10 MG/ML IJ SOLN
INTRAMUSCULAR | Status: DC | PRN
  Administered 2020-02-06: 10 mg via INTRAVENOUS

## 2020-02-06 MED ORDER — METOPROLOL TARTRATE 25 MG/10 ML ORAL SUSPENSION
12.5000 mg | Freq: Two times a day (BID) | ORAL | Status: DC
  Filled 2020-02-06: qty 5

## 2020-02-06 MED ORDER — SODIUM CHLORIDE 0.9 % IV SOLN: INTRAVENOUS | Status: DC

## 2020-02-06 MED ORDER — EPHEDRINE SULFATE-NACL 50-0.9 MG/10ML-% IV SOSY
PREFILLED_SYRINGE | INTRAVENOUS | Status: DC | PRN
  Administered 2020-02-06 (×3): 5 mg via INTRAVENOUS
  Administered 2020-02-06: 15 mg via INTRAVENOUS
  Administered 2020-02-06 (×4): 5 mg via INTRAVENOUS

## 2020-02-06 MED ORDER — HEPARIN SODIUM (PORCINE) 1000 UNIT/ML IJ SOLN
INTRAMUSCULAR | Status: AC
  Filled 2020-02-06: qty 1

## 2020-02-06 MED ORDER — FENTANYL CITRATE (PF) 250 MCG/5ML IJ SOLN
INTRAMUSCULAR | Status: DC | PRN
  Administered 2020-02-06: 100 ug via INTRAVENOUS
  Administered 2020-02-06: 200 ug via INTRAVENOUS
  Administered 2020-02-06: 100 ug via INTRAVENOUS
  Administered 2020-02-06: 150 ug via INTRAVENOUS
  Administered 2020-02-06: 100 ug via INTRAVENOUS
  Administered 2020-02-06: 50 ug via INTRAVENOUS
  Administered 2020-02-06: 200 ug via INTRAVENOUS
  Administered 2020-02-06 (×3): 100 ug via INTRAVENOUS
  Administered 2020-02-06: 50 ug via INTRAVENOUS

## 2020-02-06 MED ORDER — ROCURONIUM BROMIDE 10 MG/ML (PF) SYRINGE
PREFILLED_SYRINGE | INTRAVENOUS | Status: AC
  Filled 2020-02-06: qty 10

## 2020-02-06 MED ORDER — FAMOTIDINE IN NACL 20-0.9 MG/50ML-% IV SOLN
20.0000 mg | Freq: Two times a day (BID) | INTRAVENOUS | Status: AC
  Administered 2020-02-06 (×2): 20 mg via INTRAVENOUS
  Filled 2020-02-06: qty 50

## 2020-02-06 MED ORDER — ACETAMINOPHEN 160 MG/5ML PO SOLN
1000.0000 mg | Freq: Four times a day (QID) | ORAL | Status: AC
  Administered 2020-02-09 – 2020-02-11 (×4): 1000 mg
  Filled 2020-02-06 (×5): qty 40.6

## 2020-02-06 MED ORDER — STERILE WATER FOR INJECTION IJ SOLN
INTRAMUSCULAR | Status: AC
  Filled 2020-02-06: qty 10

## 2020-02-06 MED ORDER — SODIUM CHLORIDE 0.9 % IV SOLN
0.0500 ug/kg/min | INTRAVENOUS | Status: AC
  Filled 2020-02-06: qty 5000

## 2020-02-06 MED ORDER — PANTOPRAZOLE SODIUM 40 MG PO TBEC
40.0000 mg | DELAYED_RELEASE_TABLET | Freq: Every day | ORAL | Status: DC
  Administered 2020-02-08: 40 mg via ORAL
  Filled 2020-02-06: qty 1

## 2020-02-06 MED ORDER — CHLORHEXIDINE GLUCONATE 0.12 % MT SOLN
15.0000 mL | OROMUCOSAL | Status: AC
  Administered 2020-02-06: 15 mL via OROMUCOSAL

## 2020-02-06 MED ORDER — PHENYLEPHRINE HCL-NACL 20-0.9 MG/250ML-% IV SOLN
INTRAVENOUS | Status: DC | PRN
  Administered 2020-02-06: 25 ug/min via INTRAVENOUS

## 2020-02-06 MED ORDER — LIDOCAINE HCL (PF) 1 % IJ SOLN
INTRAMUSCULAR | Status: DC | PRN
  Administered 2020-02-06: 30 mL

## 2020-02-06 MED ORDER — LIDOCAINE HCL (PF) 1 % IJ SOLN
INTRAMUSCULAR | Status: AC
  Filled 2020-02-06: qty 30

## 2020-02-06 MED ORDER — MAGNESIUM SULFATE 4 GM/100ML IV SOLN
4.0000 g | Freq: Once | INTRAVENOUS | Status: AC
  Administered 2020-02-06: 4 g via INTRAVENOUS
  Filled 2020-02-06: qty 100

## 2020-02-06 MED ORDER — ARTIFICIAL TEARS OPHTHALMIC OINT
TOPICAL_OINTMENT | OPHTHALMIC | Status: AC
  Filled 2020-02-06: qty 3.5

## 2020-02-06 MED ORDER — PROPOFOL 10 MG/ML IV BOLUS
INTRAVENOUS | Status: AC
  Filled 2020-02-06: qty 20

## 2020-02-06 MED ORDER — PROTAMINE SULFATE 10 MG/ML IV SOLN
INTRAVENOUS | Status: AC
  Filled 2020-02-06: qty 25

## 2020-02-06 MED ORDER — MIDAZOLAM HCL 5 MG/5ML IJ SOLN
INTRAMUSCULAR | Status: DC | PRN
  Administered 2020-02-06: 1 mg via INTRAVENOUS
  Administered 2020-02-06 (×3): 2 mg via INTRAVENOUS
  Administered 2020-02-06: 1 mg via INTRAVENOUS
  Administered 2020-02-06: 2 mg via INTRAVENOUS

## 2020-02-06 MED ORDER — FENTANYL CITRATE (PF) 250 MCG/5ML IJ SOLN
INTRAMUSCULAR | Status: AC
  Filled 2020-02-06: qty 20

## 2020-02-06 MED ORDER — BISACODYL 10 MG RE SUPP: 10.0000 mg | Freq: Every day | RECTAL | Status: DC

## 2020-02-06 MED ORDER — ACETAMINOPHEN 500 MG PO TABS
1000.0000 mg | ORAL_TABLET | Freq: Four times a day (QID) | ORAL | Status: AC
  Administered 2020-02-07 – 2020-02-10 (×7): 1000 mg via ORAL
  Filled 2020-02-06 (×7): qty 2

## 2020-02-06 MED ORDER — MIDAZOLAM HCL (PF) 10 MG/2ML IJ SOLN
INTRAMUSCULAR | Status: AC
  Filled 2020-02-06: qty 2

## 2020-02-06 MED ORDER — TRAMADOL HCL 50 MG PO TABS
50.0000 mg | ORAL_TABLET | ORAL | Status: DC | PRN
  Administered 2020-02-07 – 2020-02-10 (×2): 100 mg via ORAL
  Filled 2020-02-06 (×2): qty 2

## 2020-02-06 MED ORDER — SODIUM CHLORIDE 0.9 % IV SOLN
INTRAVENOUS | Status: DC | PRN
  Administered 2020-02-06: .05 ug/kg/min via INTRAVENOUS

## 2020-02-06 MED ORDER — LACTATED RINGERS IV SOLN: 500.0000 mL | Freq: Once | INTRAVENOUS | Status: DC | PRN

## 2020-02-06 MED ORDER — STERILE WATER FOR INJECTION IV SOLN
INTRAVENOUS | Status: DC | PRN
  Administered 2020-02-06: 30 mL

## 2020-02-06 MED ORDER — PHENYLEPHRINE HCL-NACL 10-0.9 MG/250ML-% IV SOLN
INTRAVENOUS | Status: DC | PRN
Start: 2020-02-06 — End: 2020-02-06
  Administered 2020-02-06: 10 ug/min via INTRAVENOUS
  Administered 2020-02-06: 20 ug/min via INTRAVENOUS

## 2020-02-06 MED FILL — Heparin Sodium (Porcine) Inj 5000 Unit/ML: INTRAMUSCULAR | Qty: 6000 | Status: AC

## 2020-02-06 MED FILL — Heparin Sodium (Porcine) Inj 1000 Unit/ML: INTRAMUSCULAR | Qty: 2500 | Status: AC

## 2020-02-06 MED FILL — Gelatin Absorbable MT Powder: OROMUCOSAL | Qty: 1 | Status: AC

## 2020-02-06 SURGICAL SUPPLY — 144 items
ADAPTER CARDIO PERF ANTE/RETRO (ADAPTER) ×5 IMPLANT
APPLIER CLIP 9.375 SM OPEN (CLIP)
BAG DECANTER FOR FLEXI CONT (MISCELLANEOUS) ×5 IMPLANT
BLADE CLIPPER SURG (BLADE) ×6 IMPLANT
BLADE STERNUM SYSTEM 6 (BLADE) ×5 IMPLANT
BLADE SURG 15 STRL LF DISP TIS (BLADE) ×4 IMPLANT
BLADE SURG 15 STRL SS (BLADE) ×2
BNDG ELASTIC 4X5.8 VLCR STR LF (GAUZE/BANDAGES/DRESSINGS) ×5 IMPLANT
BNDG ELASTIC 6X10 VLCR STRL LF (GAUZE/BANDAGES/DRESSINGS) ×1 IMPLANT
BNDG ELASTIC 6X5.8 VLCR STR LF (GAUZE/BANDAGES/DRESSINGS) ×5 IMPLANT
BNDG GAUZE ELAST 4 BULKY (GAUZE/BANDAGES/DRESSINGS) ×5 IMPLANT
BOOT SUTURE AID YELLOW STND (SUTURE) ×1 IMPLANT
CANISTER SUCT 3000ML PPV (MISCELLANEOUS) ×10 IMPLANT
CANNULA NON VENT 18FR 12 (CANNULA) ×1 IMPLANT
CANNULA VESSEL 3MM 2 BLNT TIP (CANNULA) ×10 IMPLANT
CATH CPB KIT HENDRICKSON (MISCELLANEOUS) ×5 IMPLANT
CATH ROBINSON RED A/P 18FR (CATHETERS) ×15 IMPLANT
CLIP APPLIE 9.375 SM OPEN (CLIP) ×4 IMPLANT
CLIP RETRACTION 3.0MM CORONARY (MISCELLANEOUS) ×5 IMPLANT
CLIP VESOCCLUDE MED 24/CT (CLIP) IMPLANT
CLIP VESOCCLUDE MED 6/CT (CLIP) ×5 IMPLANT
CLIP VESOCCLUDE SM WIDE 24/CT (CLIP) ×1 IMPLANT
CLIP VESOCCLUDE SM WIDE 6/CT (CLIP) ×5 IMPLANT
CNTNR URN SCR LID CUP LEK RST (MISCELLANEOUS) IMPLANT
CONT SPEC 4OZ STRL OR WHT (MISCELLANEOUS) ×2
COVER BACK TABLE 24X17X13 BIG (DRAPES) ×1 IMPLANT
COVER MAYO STAND STRL (DRAPES) ×5 IMPLANT
COVER WAND RF STERILE (DRAPES) ×4 IMPLANT
CUFF TOURN SGL QUICK 18X4 (TOURNIQUET CUFF) IMPLANT
CUFF TOURN SGL QUICK 24 (TOURNIQUET CUFF)
CUFF TRNQT CYL 24X4X16.5-23 (TOURNIQUET CUFF) IMPLANT
DECANTER SPIKE VIAL GLASS SM (MISCELLANEOUS) IMPLANT
DERMABOND ADVANCED (GAUZE/BANDAGES/DRESSINGS) ×2
DERMABOND ADVANCED .7 DNX12 (GAUZE/BANDAGES/DRESSINGS) ×8 IMPLANT
DRAIN CHANNEL 28F RND 3/8 FF (WOUND CARE) ×15 IMPLANT
DRAIN HEMOVAC 1/8 X 5 (WOUND CARE) IMPLANT
DRAIN JACKSON PRATT 10MM FLAT (MISCELLANEOUS) ×1 IMPLANT
DRAPE CARDIOVASCULAR INCISE (DRAPES) ×1
DRAPE EXTREMITY T 121X128X90 (DISPOSABLE) ×5 IMPLANT
DRAPE HALF SHEET 40X57 (DRAPES) ×5 IMPLANT
DRAPE SLUSH/WARMER DISC (DRAPES) ×5 IMPLANT
DRAPE SRG 135X102X78XABS (DRAPES) ×4 IMPLANT
DRSG AQUACEL AG ADV 3.5X14 (GAUZE/BANDAGES/DRESSINGS) ×5 IMPLANT
ELECT CAUTERY BLADE 6.4 (BLADE) ×7 IMPLANT
ELECT REM PT RETURN 9FT ADLT (ELECTROSURGICAL) ×15
ELECTRODE REM PT RTRN 9FT ADLT (ELECTROSURGICAL) ×12 IMPLANT
EVACUATOR SILICONE 100CC (DRAIN) ×1 IMPLANT
FELT TEFLON 1X6 (MISCELLANEOUS) ×10 IMPLANT
GAUZE SPONGE 4X4 12PLY STRL (GAUZE/BANDAGES/DRESSINGS) ×10 IMPLANT
GAUZE SPONGE 4X4 12PLY STRL LF (GAUZE/BANDAGES/DRESSINGS) ×2 IMPLANT
GAUZE SPONGE 4X4 16PLY XRAY LF (GAUZE/BANDAGES/DRESSINGS) ×1 IMPLANT
GEL ULTRASOUND 20GR AQUASONIC (MISCELLANEOUS) ×4 IMPLANT
GLOVE BIO SURGEON STRL SZ 6 (GLOVE) ×5 IMPLANT
GLOVE BIO SURGEON STRL SZ 6.5 (GLOVE) ×3 IMPLANT
GLOVE BIO SURGEON STRL SZ7.5 (GLOVE) ×6 IMPLANT
GLOVE NEODERM STRL 7.5 LF PF (GLOVE) ×12 IMPLANT
GLOVE SURG NEODERM 7.5  LF PF (GLOVE) ×3
GOWN STRL REUS W/ TWL LRG LVL3 (GOWN DISPOSABLE) ×28 IMPLANT
GOWN STRL REUS W/TWL LRG LVL3 (GOWN DISPOSABLE) ×15
HANDLE STAPLE ENDO GIA SHORT (STAPLE) ×1
HEMOSTAT SPONGE AVITENE ULTRA (HEMOSTASIS) IMPLANT
KIT BASIN OR (CUSTOM PROCEDURE TRAY) ×9 IMPLANT
KIT SHUNT ARGYLE CAROTID ART 6 (VASCULAR PRODUCTS) IMPLANT
KIT SUCTION CATH 14FR (SUCTIONS) ×5 IMPLANT
KIT TURNOVER KIT B (KITS) ×10 IMPLANT
KIT VASOVIEW HEMOPRO 2 VH 4000 (KITS) ×5 IMPLANT
MARKER GRAFT CORONARY BYPASS (MISCELLANEOUS) ×15 IMPLANT
MARKER SKIN DUAL TIP RULER LAB (MISCELLANEOUS) ×1 IMPLANT
NDL 18GX1X1/2 (RX/OR ONLY) (NEEDLE) ×4 IMPLANT
NDL HYPO 18GX1.5 BLUNT FILL (NEEDLE) IMPLANT
NDL HYPO 25GX1X1/2 BEV (NEEDLE) IMPLANT
NEEDLE 18GX1X1/2 (RX/OR ONLY) (NEEDLE) ×5 IMPLANT
NEEDLE HYPO 18GX1.5 BLUNT FILL (NEEDLE) ×5 IMPLANT
NEEDLE HYPO 25GX1X1/2 BEV (NEEDLE) ×5 IMPLANT
NS IRRIG 1000ML POUR BTL (IV SOLUTION) ×36 IMPLANT
PACK CAROTID (CUSTOM PROCEDURE TRAY) ×5 IMPLANT
PACK E OPEN HEART (SUTURE) ×5 IMPLANT
PACK OPEN HEART (CUSTOM PROCEDURE TRAY) ×5 IMPLANT
PACK SPY-PHI (KITS) ×1 IMPLANT
PAD ARMBOARD 7.5X6 YLW CONV (MISCELLANEOUS) ×20 IMPLANT
PAD ELECT DEFIB RADIOL ZOLL (MISCELLANEOUS) ×5 IMPLANT
PATCH HEMASHIELD 8X75 (Vascular Products) ×1 IMPLANT
PENCIL BUTTON HOLSTER BLD 10FT (ELECTRODE) ×5 IMPLANT
POSITIONER HEAD DONUT 9IN (MISCELLANEOUS) ×9 IMPLANT
POWDER SURGICEL 3.0 GRAM (HEMOSTASIS) IMPLANT
PUNCH AORTIC ROTATE 4.0MM (MISCELLANEOUS) ×1 IMPLANT
RELOAD EGIA 45 MED/THCK PURPLE (STAPLE) ×1 IMPLANT
RELOAD STAPLE 45 PURP MED/THCK (STAPLE) IMPLANT
RELOAD TRI 45 ART MED THCK PUR (STAPLE) ×5 IMPLANT
SEALANT SURG COSEAL 8ML (VASCULAR PRODUCTS) ×1 IMPLANT
SET CARDIOPLEGIA MPS 5001102 (MISCELLANEOUS) ×1 IMPLANT
SHEARS HARMONIC 9CM CVD (BLADE) IMPLANT
SHUNT CAROTID BYPASS 10 (VASCULAR PRODUCTS) ×1 IMPLANT
SHUNT CAROTID BYPASS 12FRX15.5 (VASCULAR PRODUCTS) IMPLANT
SPONGE LAP 18X18 RF (DISPOSABLE) ×2 IMPLANT
STAPLER ENDO GIA 12 SHRT THIN (STAPLE) IMPLANT
STAPLER ENDO GIA 12MM SHORT (STAPLE) ×4 IMPLANT
STAPLER VISISTAT 35W (STAPLE) ×1 IMPLANT
SUPPORT HEART JANKE-BARRON (MISCELLANEOUS) ×5 IMPLANT
SUT BONE WAX W31G (SUTURE) ×5 IMPLANT
SUT ETHILON 3 0 FSL (SUTURE) ×1 IMPLANT
SUT ETHILON 3 0 PS 1 (SUTURE) IMPLANT
SUT MNCRL AB 3-0 PS2 18 (SUTURE) ×10 IMPLANT
SUT PDS AB 1 CTX 36 (SUTURE) ×10 IMPLANT
SUT PROLENE 3 0 SH DA (SUTURE) ×5 IMPLANT
SUT PROLENE 5 0 C 1 36 (SUTURE) IMPLANT
SUT PROLENE 6 0 C 1 30 (SUTURE) ×15 IMPLANT
SUT PROLENE 6 0 CC (SUTURE) ×5 IMPLANT
SUT PROLENE 7 0 BV1 MDA (SUTURE) ×1 IMPLANT
SUT PROLENE 8 0 BV175 6 (SUTURE) IMPLANT
SUT PROLENE BLUE 7 0 (SUTURE) ×5 IMPLANT
SUT SILK  1 MH (SUTURE) ×2
SUT SILK 1 MH (SUTURE) IMPLANT
SUT SILK 2 0 (SUTURE) ×1
SUT SILK 2 0 SH CR/8 (SUTURE) IMPLANT
SUT SILK 2-0 18XBRD TIE 12 (SUTURE) IMPLANT
SUT SILK 3 0 SH CR/8 (SUTURE) IMPLANT
SUT SILK 3 0 TIES 17X18 (SUTURE) ×1
SUT SILK 3-0 18XBRD TIE BLK (SUTURE) IMPLANT
SUT STEEL 6MS V (SUTURE) ×7 IMPLANT
SUT STEEL SZ 6 DBL 3X14 BALL (SUTURE) ×5 IMPLANT
SUT VIC AB 2-0 CT1 27 (SUTURE) ×2
SUT VIC AB 2-0 CT1 TAPERPNT 27 (SUTURE) IMPLANT
SUT VIC AB 2-0 CTX 27 (SUTURE) IMPLANT
SUT VIC AB 3-0 SH 27 (SUTURE) ×1
SUT VIC AB 3-0 SH 27X BRD (SUTURE) ×4 IMPLANT
SUT VIC AB 3-0 X1 27 (SUTURE) IMPLANT
SUT VICRYL 4-0 PS2 18IN ABS (SUTURE) ×5 IMPLANT
SYR 10ML LL (SYRINGE) ×2 IMPLANT
SYR 20ML LL LF (SYRINGE) ×2 IMPLANT
SYR 30ML LL (SYRINGE) ×4 IMPLANT
SYR 3ML LL SCALE MARK (SYRINGE) ×4 IMPLANT
SYR 50ML SLIP (SYRINGE) IMPLANT
SYR CONTROL 10ML LL (SYRINGE) IMPLANT
SYSTEM SAHARA CHEST DRAIN ATS (WOUND CARE) ×5 IMPLANT
TAPE CLOTH SURG 4X10 WHT LF (GAUZE/BANDAGES/DRESSINGS) ×1 IMPLANT
TAPE PAPER 2X10 WHT MICROPORE (GAUZE/BANDAGES/DRESSINGS) ×1 IMPLANT
TOWEL GREEN STERILE (TOWEL DISPOSABLE) ×10 IMPLANT
TOWEL GREEN STERILE FF (TOWEL DISPOSABLE) ×5 IMPLANT
TRAY FOLEY SLVR 16FR TEMP STAT (SET/KITS/TRAYS/PACK) ×5 IMPLANT
TUBING LAP HI FLOW INSUFFLATIO (TUBING) ×5 IMPLANT
UNDERPAD 30X36 HEAVY ABSORB (UNDERPADS AND DIAPERS) ×5 IMPLANT
WATER STERILE IRR 1000ML POUR (IV SOLUTION) ×15 IMPLANT
WATER STERILE IRR 1000ML UROMA (IV SOLUTION) IMPLANT

## 2020-02-06 NOTE — Transfer of Care (Signed)
Immediate Anesthesia Transfer of Care Note  Patient: MAKSYM PFIFFNER  Procedure(s) Performed: CORONARY ARTERY BYPASS GRAFTING (CABG),  x Four with Bilateral IMAs, and right leg greater saphenous vein (N/A Chest) TRANSESOPHAGEAL ECHOCARDIOGRAM (TEE) (N/A ) INDOCYANINE GREEN FLUORESCENCE IMAGING (ICG) (N/A ) CAROTID ENDARTERECTOMY LEFT with  Patch Angioplasty Using Hemashield Platinum Finesse patch (Left Neck)  Patient Location: SICU  Anesthesia Type:General  Level of Consciousness: Patient remains intubated per anesthesia plan  Airway & Oxygen Therapy: Patient remains intubated per anesthesia plan and Patient placed on Ventilator (see vital sign flow sheet for setting)  Post-op Assessment: Post -op Vital signs reviewed and stable  Post vital signs: stable  Last Vitals:  Vitals Value Taken Time  BP    Temp 36.3 C 02/06/20 1524  Pulse 86 02/06/20 1524  Resp 22 02/06/20 1524  SpO2 100 % 02/06/20 1524  Vitals shown include unvalidated device data.  Last Pain:  Vitals:   02/06/20 0617  TempSrc: Oral  PainSc:       Patients Stated Pain Goal: 2 (02/06/20 0610)  Complications: No apparent anesthesia complications

## 2020-02-06 NOTE — Procedures (Signed)
Extubation Procedure Note  Patient Details:   Name: Tony Munoz DOB: [birth year] MRN: [id number]   Airway Documentation:  Airway 7.5 mm (Active)  Secured at (cm) 23 cm 02/06/20 1959  Measured From Lips 02/06/20 1959  Secured Location Right 02/06/20 1959  Secured By Pink Tape 02/06/20 1959  Site Condition Dry 02/06/20 1959   Vent end date: 02/06/20 Vent end time: 2121   Evaluation  O2 sats: stable throughout Complications: No apparent complications Patient did tolerate procedure well. Bilateral Breath Sounds: Clear   Yes  Pt extubated to 4 lpm.  Patient was able to verbalize his name.  NIF -20 VC 9L.  RN will instruct patient incentive spirometer.  Cuff leak heard.   Letitia Caul [facility] 02/06/2020, 9:21 PM

## 2020-02-06 NOTE — H&P (Signed)
History and Physical Interval Note:  02/06/2020 7:23 AM  Tony Munoz  has presented today for surgery, with the diagnosis of CAD.  The various methods of treatment have been discussed with the patient and family. After consideration of risks, benefits and other options for treatment, the patient has consented to  Procedure(s): CORONARY ARTERY BYPASS GRAFTING (CABG), with possible Bilateral IMA (N/A) possible, MITRAL VALVE REPAIR (MVR) or replacement (N/A) Possible, RADIAL ARTERY HARVEST (Left) TRANSESOPHAGEAL ECHOCARDIOGRAM (TEE) (N/A) INDOCYANINE GREEN FLUORESCENCE IMAGING (ICG) (N/A) ENDARTERECTOMY CAROTID (Left) as a surgical intervention.  The patient's history has been reviewed, patient examined, no change in status, stable for surgery.  I have reviewed the patient's chart and labs.  Questions were answered to the patient's satisfaction.     Tony Munoz       [address]       [place] [address]             [phone]     CARDIOTHORACIC SURGERY CONSULTATION REPORT  Referring Provider is No ref. provider found Primary Cardiologist is No primary care provider on file. PCP is Tony Malta, MD  No chief complaint on file.   HPI: 64 year old man with hypertension noted increased shortness of breath with exertion.  He was evaluated locally and underwent a stress test which was positive.  This led to left heart catheterization which demonstrated severe multivessel coronary artery disease.  In the work-up he was also noted to have severe cerebrovascular disease.  In addition, he is found to have moderate mitral regurgitation.  Patient is referred for consideration of coronary artery bypass grafting with mitral surgery.  He is presently clinically stable.  He denies exertional or rest chest pain.  He has mild shortness of breath with exertion.  He remains hypertensive.   Past Medical History:  Diagnosis Date  . Abnormal EKG   . Anxiety   .  Bilateral carotid bruits 12/20/2019  . Carotid artery occlusion   . Cigarette smoker 12/20/2019  . Coronary artery disease   . DOE (dyspnea on exertion) 12/20/2019  . Dyspnea   . Essential hypertension 12/20/2019  . Prominent abdominal aortic pulsation 12/20/2019  . Radiculopathy 02/15/2019    Past Surgical History:  Procedure Laterality Date  . ANTERIOR CERVICAL DECOMPRESSION/DISCECTOMY FUSION 4 LEVELS Bilateral 02/15/2019   Procedure: ANTERIOR CERVICAL DECOMPRESSION FUSION, CERVICAL FOUR-FIVE, CERVICAL FIVE-SIX, CERVICAL SIX-SEVEN WITH INSTRUMENTATION AND ALLOGRAFT;  Surgeon: Estill Bamberg, MD;  Location: [facility] OR;  Service: Orthopedics;  Laterality: Bilateral;  . BACK SURGERY    . EYE SURGERY Bilateral    Cataracts  . HERNIA REPAIR     double hernia - 60yrs ago  . LEFT HEART CATH AND CORONARY ANGIOGRAPHY N/A 01/19/2020   Procedure: LEFT HEART CATH AND CORONARY ANGIOGRAPHY;  Surgeon: Lyn Records, MD;  Location: [facility];  Service: Cardiovascular;  Laterality: N/A;  . TONSILLECTOMY      History reviewed. No pertinent family history.  Social History   Socioeconomic History  . Marital status: Single    Spouse name: Not on file  . Number of children: Not on file  . Years of education: Not on file  . Highest education level: Not on file  Occupational History  . Not on file  Tobacco Use  . Smoking status: Current Every Day Smoker    Packs/day: 1.50    Years: 50.00    Pack years: 75.00    Types: Cigarettes  . Smokeless tobacco: Never Used  Substance  and Sexual Activity  . Alcohol use: Yes    Comment: on occassion  . Drug use: Never  . Sexual activity: Not on file  Other Topics Concern  . Not on file  Social History Narrative  . Not on file   Social Determinants of Health   Financial Resource Strain:   . Difficulty of Paying Living Expenses:   Food Insecurity:   . Worried About Programme researcher, broadcasting/film/video in the Last Year:   . Barista in the Last Year:     Transportation Needs:   . Freight forwarder (Medical):   [name] Lack of Transportation (Non-Medical):   Physical Activity:   . Days of Exercise per Week:   . Minutes of Exercise per Session:   Stress:   . Feeling of Stress :   Social Connections:   . Frequency of Communication with Friends and Family:   . Frequency of Social Gatherings with Friends and Family:   . Attends Religious Services:   . Active Member of Clubs or Organizations:   . Attends Banker Meetings:   [name] Marital Status:   Intimate Partner Violence:   . Fear of Current or Ex-Partner:   . Emotionally Abused:   [name] Physically Abused:   . Sexually Abused:     Current Facility-Administered Medications  Medication Dose Route Frequency Provider Last Rate Last Admin  . 0.9 %  sodium chloride infusion   Intravenous Continuous Fields, Charles E, MD      . cefUROXime (ZINACEF) 1.5 g in sodium chloride 0.9 % 100 mL IVPB  1.5 g Intravenous To OR Jaquarius Seder, Merri Brunette, MD      . cefUROXime (ZINACEF) 750 mg in sodium chloride 0.9 % 100 mL IVPB  750 mg Intravenous To OR Dorina Ribaudo, Merri Brunette, MD      . chlorhexidine (HIBICLENS) 4 % liquid 2 application  30 mL Topical UD Julien Oscar Z, MD      . chlorhexidine (HIBICLENS) 4 % liquid 4 application  60 mL Topical Once Sherren Kerns, MD       And  . Melene Muller ON 02/07/2020] chlorhexidine (HIBICLENS) 4 % liquid 4 application  60 mL Topical Once Fields, Janetta Hora, MD      . dexmedetomidine (PRECEDEX) 400 MCG/100ML (4 mcg/mL) infusion  0.1-0.7 mcg/kg/hr Intravenous To OR Viyaan Champine, Merri Brunette, MD      . EPINEPHrine (ADRENALIN) 4 mg in NS 250 mL (0.016 mg/mL) premix infusion  0-10 mcg/min Intravenous To OR Suresh Audi Z, MD      . heparin 30,000 units/NS 1000 mL solution for CELLSAVER   Other To OR Katesha Eichel, Merri Brunette, MD      . heparin sodium (porcine) 2,500 Units, papaverine 30 mg in electrolyte-148 (PLASMALYTE-148) 500 mL irrigation   Irrigation To OR Crystalina Stodghill Z, MD      .  insulin regular, human (MYXREDLIN) 100 units/ 100 mL infusion   Intravenous To OR Lurlene Ronda Z, MD      . magnesium sulfate (IV Push/IM) injection 40 mEq  40 mEq Other To OR Seriah Brotzman, Merri Brunette, MD      . milrinone (PRIMACOR) 20 MG/100 ML (0.2 mg/mL) infusion  0.3 mcg/kg/min Intravenous To OR Kelbi Renstrom Z, MD      . nitroGLYCERIN 50 mg in dextrose 5 % 250 mL (0.2 mg/mL) infusion  2-200 mcg/min Intravenous To OR Kaho Selle Z, MD      . norepinephrine (LEVOPHED) 4mg  in premix infusion  0-40 mcg/min Intravenous To OR Pau Banh Z, MD      . phenylephrine (NEOSYNEPHRINE) 20-0.9 MG/250ML-% infusion  30-200 mcg/min Intravenous To OR Leslea Vowles Z, MD      . potassium chloride injection 80 mEq  80 mEq Other To OR Graysyn Bache, Glenice Bow, MD      . remifentanil (ULTIVA) 5000 mcg in 250 mL normal saline (20 mcg/mL) for ICU use  0.05 mcg/kg/min Intravenous To OR Legrande Hao, Glenice Bow, MD      . tranexamic acid (CYKLOKAPRON) 2,500 mg in sodium chloride 0.9 % 250 mL (10 mg/mL) infusion  1.5 mg/kg/hr Intravenous To OR Ka Bench, Glenice Bow, MD      . tranexamic acid (CYKLOKAPRON) bolus via infusion - over 30 minutes 828 mg  15 mg/kg Intravenous To OR Alexius Ellington Z, MD      . tranexamic acid (CYKLOKAPRON) pump prime solution 110 mg  2 mg/kg Intracatheter To OR Aminata Buffalo, Glenice Bow, MD      . vancomycin (VANCOREADY) IVPB 1250 mg/250 mL  1,250 mg Intravenous To OR Damain , Glenice Bow, MD        No Known Allergies    Review of Systems:   General:  Reduced energy, no weight change  Cardiac:  No chest pain with exertion, positive SOB with moderate exertion, denies arrhythmia/atrial fibrillation,   Respiratory:  Positive shortness of breath, no home oxygen,  GI:   Negative  GU:   Negative  Vascular:  Severe cerebrovascular disease  Neuro:   History of stroke/ TIA's,   Musculoskeletal: Positive arthritis/  myalgias,  Skin:   Negative  Psych:   Negative  Eyes:   Negative  ENT:   Negative  Hematologic:  Positive easy bruising,   Endocrine:  No diabetes, does not check CBG's at home     Physical Exam:   BP (!) 180/87   Pulse 62   Temp 98 F (36.7 C) (Oral)   Resp 17   Ht 5\' 9"  (1.753 m)   Wt 51.7 kg   SpO2 100%   BMI 16.83 kg/m   General:   well-appearing, quite thin   HEENT:  Unremarkable   Neck:   no JVD, no bruits, no adenopathy   Chest:   clear to auscultation, symmetrical breath sounds, no wheezes, no rhonchi   CV:   RRR, 2/6 SEM at LSB  Abdomen:  soft, non-tender, no masses   Extremities:  warm, well-perfused, pulses intact, no LE edema  Rectal/GU  Deferred  Neuro:   Grossly non-focal and symmetrical throughout  Skin:   Clean and dry, no rashes, no breakdown   Diagnostic Tests:  I have reviewed available imaging and agree with interpretation   Impression:  64 year old man with multivessel coronary artery disease and severe cerebrovascular disease.  He is a good candidate for my carotid CABG surgery.   Plan:  Tentatively planned combined carotid CABG with Dr. Oneida Alar on February 06, 2020. I would prefer a preoperative head CT given his history of strokes.  He also has a relatively high perioperative stroke risk in a baseline neuro assessment would be helpful should he run into trouble postoperative.  Other routine work-up as per usual for CABG.   I spent in excess of 40 minutes during the conduct of this office consultation and >50% of this time involved direct face-to-face encounter with the patient for counseling and/or coordination of their care.          Level 3 Office Consult = 40 minutes  Level 4 Office Consult = 60 minutes         Level 5 Office Consult = 80 minutes  B. Lorayne Marek, MD 02/06/2020 7:23 AM

## 2020-02-06 NOTE — Anesthesia Procedure Notes (Signed)
Central Venous Catheter Insertion Performed by: Albertha Ghee, MD, anesthesiologist Start/End6/09/2019 7:20 AM, 02/06/2020 7:28 AM Patient location: Pre-op. Preanesthetic checklist: patient identified, IV checked, site marked, risks and benefits discussed, surgical consent, monitors and equipment checked, pre-op evaluation, timeout performed and anesthesia consent Position: Trendelenburg Lidocaine 1% used for infiltration and patient sedated Hand hygiene performed , maximum sterile barriers used  and Seldinger technique used Catheter size: 9 Fr Central line and PA cath was placed.MAC introducer Swan type:thermodilation Procedure performed using ultrasound guided technique. Ultrasound Notes:anatomy identified, needle tip was noted to be adjacent to the nerve/plexus identified, no ultrasound evidence of intravascular and/or intraneural injection and image(s) printed for medical record Attempts: 1 Following insertion, line sutured, dressing applied and Biopatch. Post procedure assessment: blood return through all ports, free fluid flow and no air  Patient tolerated the procedure well with no immediate complications.

## 2020-02-06 NOTE — Brief Op Note (Signed)
02/06/2020  5:20 PM  PATIENT:  Tony Munoz  63 y.o. male  PRE-OPERATIVE DIAGNOSIS:  Coronary Artery Disease carotid artery stenosis  POST-OPERATIVE DIAGNOSIS:  Coronary Artery Disease carotid artery stenosis  PROCEDURE:  Procedure(s): CORONARY ARTERY BYPASS GRAFTING (CABG),  x Four with Bilateral IMAs, and right leg greater saphenous vein (N/A) TRANSESOPHAGEAL ECHOCARDIOGRAM (TEE) (N/A) INDOCYANINE GREEN FLUORESCENCE IMAGING (ICG) (N/A) CAROTID ENDARTERECTOMY LEFT with  Patch Angioplasty Using Hemashield Platinum Finesse patch (Left) LIMA to LAD RSVG to D1 RSVG to D2 RIMA to PDA  Right Middle lobe bleb resection SURGEON:  Surgeon(s) and Role: Panel 1:    * [name], MD - Primary Panel 2:    * Sherren Kerns, MD - Primary  PHYSICIAN ASSISTANTDoylene Canning, W PA-C  ASSISTANTS: staff   ANESTHESIA:   general  EBL:  622 mL   BLOOD ADMINISTERED:400 CC CELLSAVER  DRAINS: 3 Chest Tube(s) in the mediastinum and bilateral pleural spaces   LOCAL MEDICATIONS USED:  OTHER exparel   SPECIMEN:  Source of Specimen:  right middle lobe wedge resection  DISPOSITION OF SPECIMEN:  PATHOLOGY  COUNTS:  YES  TOURNIQUET:  * No tourniquets in log *  DICTATION: .Note written in EPIC  PLAN OF CARE: Admit to inpatient   PATIENT DISPOSITION:  ICU - intubated and critically ill.   Delay start of Pharmacological VTE agent (>24hrs) due to surgical blood loss or risk of bleeding: yes

## 2020-02-06 NOTE — Plan of Care (Signed)
Plan to reduce sedation and wean from ventilator

## 2020-02-06 NOTE — Anesthesia Procedure Notes (Addendum)
Procedure Name: Intubation Date/Time: 02/06/2020 8:03 AM Performed by: Dorie Rank, CRNA Pre-anesthesia Checklist: Patient identified, Emergency Drugs available, Suction available and Patient being monitored Patient Re-evaluated:Patient Re-evaluated prior to induction Oxygen Delivery Method: Circle System Utilized Preoxygenation: Pre-oxygenation with 100% oxygen Induction Type: IV induction Ventilation: Mask ventilation without difficulty Laryngoscope Size: Glidescope and 4 Grade View: Grade I Tube type: Oral Tube size: 7.5 mm Number of attempts: 1 Airway Equipment and Method: Stylet,  Oral airway and Video-laryngoscopy (head alligned and kept where patient stated it was comfortable) Placement Confirmation: ETT inserted through vocal cords under direct vision,  positive ETCO2 and breath sounds checked- equal and bilateral Secured at: 22 cm Tube secured with: Tape Dental Injury: Teeth and Oropharynx as per pre-operative assessment  Difficulty Due To: Difficulty was anticipated Comments: SRNA- [id number] intubated

## 2020-02-06 NOTE — Anesthesia Preprocedure Evaluation (Addendum)
Anesthesia Evaluation  Patient identified by MRN, date of birth, ID band Patient awake    Reviewed: Allergy & Precautions, H&P , NPO status , Patient's Chart, lab work & pertinent test results  Airway Mallampati: I  TM Distance: >3 FB Neck ROM: Limited    Dental  (+) Edentulous Upper, Edentulous Lower   Pulmonary shortness of breath, Current Smoker,    breath sounds clear to auscultation       Cardiovascular hypertension, + CAD and + DOE  + Valvular Problems/Murmurs MR  Rhythm:regular Rate:Normal  Multivessel CAD. Mildly reduced LV function. No RWMA. Severe MR seen on TEE.  Stage 2 diastolic dysfunction.   Neuro/Psych PSYCHIATRIC DISORDERS Anxiety  Neuromuscular disease    GI/Hepatic   Endo/Other    Renal/GU      Musculoskeletal   Abdominal   Peds  Hematology   Anesthesia Other Findings   Reproductive/Obstetrics                            Anesthesia Physical Anesthesia Plan  ASA: III  Anesthesia Plan: General   Post-op Pain Management:    Induction: Intravenous  PONV Risk Score and Plan: 1 and Ondansetron, Dexamethasone, Midazolam and Treatment may vary due to age or medical condition  Airway Management Planned: Oral ETT  Additional Equipment: Arterial line, CVP, PA Cath, TEE, 3D TEE and Ultrasound Guidance Line Placement  Intra-op Plan:   Post-operative Plan: Post-operative intubation/ventilation  Informed Consent: I have reviewed the patients History and Physical, chart, labs and discussed the procedure including the risks, benefits and alternatives for the proposed anesthesia with the patient or authorized representative who has indicated his/her understanding and acceptance.       Plan Discussed with: CRNA, Anesthesiologist and Surgeon  Anesthesia Plan Comments:         Anesthesia Quick Evaluation

## 2020-02-06 NOTE — Anesthesia Procedure Notes (Signed)
Arterial Line Insertion Start/End6/09/2019 6:30 AM Performed by: Achille Rich, MD, Dorie Rank, CRNA, CRNA  Preanesthetic checklist: patient identified, IV checked, site marked, risks and benefits discussed, surgical consent, monitors and equipment checked, pre-op evaluation, timeout performed and anesthesia consent Lidocaine 1% used for infiltration Right, radial was placed Catheter size: 20 G Hand hygiene performed , maximum sterile barriers used  and Seldinger technique used Allen's test indicative of satisfactory collateral circulation Attempts: 1 Procedure performed without using ultrasound guided technique. Following insertion, dressing applied and Biopatch. Post procedure assessment: normal and unchanged  Patient tolerated the procedure well with no immediate complications.

## 2020-02-06 NOTE — Interval H&P Note (Signed)
History and Physical Interval Note:  02/06/2020 7:32 AM  Tony Munoz  has presented today for surgery, with the diagnosis of CAD.  The various methods of treatment have been discussed with the patient and family. After consideration of risks, benefits and other options for treatment, the patient has consented to  Procedure(s): CORONARY ARTERY BYPASS GRAFTING (CABG), with possible Bilateral IMA (N/A) possible, MITRAL VALVE REPAIR (MVR) or replacement (N/A) Possible, RADIAL ARTERY HARVEST (Left) TRANSESOPHAGEAL ECHOCARDIOGRAM (TEE) (N/A) INDOCYANINE GREEN FLUORESCENCE IMAGING (ICG) (N/A) ENDARTERECTOMY CAROTID (Left) as a surgical intervention.  The patient's history has been reviewed, patient examined, no change in status, stable for surgery.  I have reviewed the patient's chart and labs.  Questions were answered to the patient's satisfaction.     Fabienne Bruns

## 2020-02-06 NOTE — Progress Notes (Signed)
Initiated heart wean protocol rate of 4 and FI02 of 40%.

## 2020-02-06 NOTE — Op Note (Signed)
Procedure: Left carotid endarterectomy  Preoperative diagnosis: > 80% asymptomatic left internal carotid artery stenosis  Postoperative diagnosis: Same  Anesthesia General  Asst.: Paulo Fruit, PAC  Operative findings: #1 greater than 80% left internal carotid stenosis                                                             #2 Dacron patch           #3 10 Fr shunt  Operative details: After obtaining informed consent, the patient was taken to the operating room. The patient was placed in a supine position on the operating room table. After induction of general anesthesia, the patient's entire neck and chest was prepped and draped in the usual sterile fashion. An oblique incision was made on the left aspect of the patient's neck anterior to the border the left sternocleidomastoid muscle. The incision was carried into the subcutaneous tissues and through the platysma. The sternocleidomastoid muscle was identified and reflected laterally. The omohyoid muscle was identified and this was divided with cautery. The common carotid artery was then found at the base of the incision this was dissected free circumferentially. It was fairly soft on palpation.  The vagus nerve was identified and protected. Dissection was then carried up to the level carotid bifurcation.   The hyperglossal nerve was above the primary area of dissection. The inferior aspect of the nerve was identified. The internal carotid artery was dissected free circumferentially just below the level of the hypoglossal nerve and it was soft in character at this location and above any palpable disease. A vessel loop was placed around this.  Next the external carotid and superior thyroid arteries were dissected free circumferentially and vessel loops were placed around these. The patient was given 6000 units of intravenous heparin.  After 2 minutes of circulation time and raising the mean arterial pressure to 90 mm mercury, the distal internal  carotid artery was controlled with small bulldog clamp. The external carotid and superior thyroid arteries were controlled with vessel loops. The common carotid artery was controlled with a peripheral DeBakey clamp. A longitudinal opening was made in the common carotid artery just below the bifurcation. The arteriotomy was extended distally up into the internal carotid with Potts scissors. There was a large ulcerated plaque with greater than 80% stenosis in the internal carotid.  A 10 Fr shunt was brought onto the field and fashioned to fit the patient's artery.  This was threaded into the distal internal carotid artery and allowed to backbleed thoroughly.  There was good but not pulsatile backbleeding.  This was then threaded into the common carotid and secured with a Rummel tourniquet.   There was no air at this point and flow was restored to the brain.  Attention was then turned to the common carotid artery once again. A suitable endarterectomy plane was obtained and endarterectomy was begun in the common carotid artery and a good proximal endpoint was obtained. An eversion endarterectomy was performed on the external carotid artery and a good endpoint was obtained. The plaque was then elevated in the internal carotid artery.  There was a slight stepoff so this was tacked with several 7 0 prolene sutures.  The plaque was passed off the table. All loose debris was then removed from the  carotid bed and everything was thoroughly irrigated with heparinized saline. A Dacron patch was then brought on to the operative field and this was sewn on as a patch angioplasty using a running 6-0 Prolene suture. Prior to completion of the anastomosis the internal carotid artery was thoroughly backbled. This was then controlled again with a fine bulldog clamp.  The common carotid was thoroughly flushed forward. The external carotid was also thoroughly backbled.  The remainder of the patch was completed and the anastomosis was  secured. Flow was then restored first retrograde from the external carotid into the carotid bed then antegrade from the common carotid to the external carotid artery and after approximately 5 cardiac cycles to the internal carotid artery. Doppler was used to evaluate the external/internal and common carotid arteries and these all had good Doppler flow. Hemostasis was obtained with avitene and direct pressure.    At this point the pt CABG procedure was performed by Dr Vickey Sages.  At the conclusion of the coronary bypass procedure, a 10 flat JP was brought out through a separate stab incision and sutured to the skin. The platysma muscle was reapproximated using a running 3-0 Vicryl suture. The skin was closed with 4 0 Vicryl subcuticular stitch.  Dermabond was applied.   Fabienne Bruns, MD [facility] Office: [phone] Pager: [phone]

## 2020-02-07 ENCOUNTER — Inpatient Hospital Stay (HOSPITAL_COMMUNITY): Payer: Medicare HMO

## 2020-02-07 DIAGNOSIS — I6522 Occlusion and stenosis of left carotid artery: Secondary | ICD-10-CM

## 2020-02-07 LAB — BPAM PLATELET PHERESIS
Blood Product Expiration Date: 202106032359
ISSUE DATE / TIME: 202106011358
Unit Type and Rh: 6200

## 2020-02-07 LAB — CBC
HCT: 22.9 % — ABNORMAL LOW (ref 39.0–52.0)
HCT: 32.5 % — ABNORMAL LOW (ref 39.0–52.0)
Hemoglobin: 7.5 g/dL — ABNORMAL LOW (ref 13.0–17.0)
Hemoglobin: 11 g/dL — ABNORMAL LOW (ref 13.0–17.0)
MCH: 32.5 pg (ref 26.0–34.0)
MCH: 31.3 pg (ref 26.0–34.0)
MCHC: 32.8 g/dL (ref 30.0–36.0)
MCHC: 33.8 g/dL (ref 30.0–36.0)
MCV: 99.1 fL (ref 80.0–100.0)
MCV: 92.3 fL (ref 80.0–100.0)
Platelets: 99 10*3/uL — ABNORMAL LOW (ref 150–400)
Platelets: 91 10*3/uL — ABNORMAL LOW (ref 150–400)
RBC: 2.31 MIL/uL — ABNORMAL LOW (ref 4.22–5.81)
RBC: 3.52 MIL/uL — ABNORMAL LOW (ref 4.22–5.81)
RDW: 13.1 % (ref 11.5–15.5)
RDW: 16.1 % — ABNORMAL HIGH (ref 11.5–15.5)
WBC: 6.7 10*3/uL (ref 4.0–10.5)
WBC: 10.8 10*3/uL — ABNORMAL HIGH (ref 4.0–10.5)
nRBC: 0 % (ref 0.0–0.2)
nRBC: 0 % (ref 0.0–0.2)

## 2020-02-07 LAB — POCT I-STAT 7, (LYTES, BLD GAS, ICA,H+H)
Acid-Base Excess: 0 mmol/L (ref 0.0–2.0)
Bicarbonate: 25.4 mmol/L (ref 20.0–28.0)
Calcium, Ion: 1.15 mmol/L (ref 1.15–1.40)
HCT: 17 % — ABNORMAL LOW (ref 39.0–52.0)
Hemoglobin: 5.8 g/dL — CL (ref 13.0–17.0)
O2 Saturation: 99 %
Patient temperature: 36.3
Potassium: 4 mmol/L (ref 3.5–5.1)
Sodium: 143 mmol/L (ref 135–145)
TCO2: 27 mmol/L (ref 22–32)
pCO2 arterial: 43.2 mmHg (ref 32.0–48.0)
pH, Arterial: 7.374 (ref 7.350–7.450)
pO2, Arterial: 163 mmHg — ABNORMAL HIGH (ref 83.0–108.0)

## 2020-02-07 LAB — BASIC METABOLIC PANEL
Anion gap: 7 (ref 5–15)
Anion gap: 6 (ref 5–15)
BUN: 8 mg/dL (ref 8–23)
BUN: 8 mg/dL (ref 8–23)
CO2: 22 mmol/L (ref 22–32)
CO2: 25 mmol/L (ref 22–32)
Calcium: 7.6 mg/dL — ABNORMAL LOW (ref 8.9–10.3)
Calcium: 7.8 mg/dL — ABNORMAL LOW (ref 8.9–10.3)
Chloride: 111 mmol/L (ref 98–111)
Chloride: 107 mmol/L (ref 98–111)
Creatinine, Ser: 0.85 mg/dL (ref 0.61–1.24)
Creatinine, Ser: 0.83 mg/dL (ref 0.61–1.24)
GFR calc Af Amer: >60 mL/min (ref >60)
GFR calc Af Amer: >60 mL/min (ref >60)
GFR calc non Af Amer: >60 mL/min (ref >60)
GFR calc non Af Amer: >60 mL/min (ref >60)
Glucose, Bld: 142 mg/dL — ABNORMAL HIGH (ref 70–99)
Glucose, Bld: 110 mg/dL — ABNORMAL HIGH (ref 70–99)
Potassium: 3.9 mmol/L (ref 3.5–5.1)
Potassium: 4.3 mmol/L (ref 3.5–5.1)
Sodium: 140 mmol/L (ref 135–145)
Sodium: 138 mmol/L (ref 135–145)

## 2020-02-07 LAB — PREPARE PLATELET PHERESIS: Unit division: 0

## 2020-02-07 LAB — GLUCOSE, CAPILLARY
Glucose-Capillary: 109 mg/dL — ABNORMAL HIGH (ref 70–99)
Glucose-Capillary: 113 mg/dL — ABNORMAL HIGH (ref 70–99)
Glucose-Capillary: 120 mg/dL — ABNORMAL HIGH (ref 70–99)
Glucose-Capillary: 138 mg/dL — ABNORMAL HIGH (ref 70–99)
Glucose-Capillary: 144 mg/dL — ABNORMAL HIGH (ref 70–99)
Glucose-Capillary: 161 mg/dL — ABNORMAL HIGH (ref 70–99)
Glucose-Capillary: 182 mg/dL — ABNORMAL HIGH (ref 70–99)
Glucose-Capillary: 90 mg/dL (ref 70–99)
Glucose-Capillary: 116 mg/dL — ABNORMAL HIGH (ref 70–99)
Glucose-Capillary: 104 mg/dL — ABNORMAL HIGH (ref 70–99)
Glucose-Capillary: 112 mg/dL — ABNORMAL HIGH (ref 70–99)
Glucose-Capillary: 122 mg/dL — ABNORMAL HIGH (ref 70–99)

## 2020-02-07 LAB — SURGICAL PATHOLOGY

## 2020-02-07 LAB — ECHO INTRAOPERATIVE TEE
Height: 69 in
Weight: 1824 oz

## 2020-02-07 LAB — MAGNESIUM
Magnesium: 2.4 mg/dL (ref 1.7–2.4)
Magnesium: 2.4 mg/dL (ref 1.7–2.4)

## 2020-02-07 LAB — PREPARE RBC (CROSSMATCH)

## 2020-02-07 MED ORDER — AMIODARONE LOAD VIA INFUSION
150.0000 mg | Freq: Once | INTRAVENOUS | Status: AC
  Administered 2020-02-07: 150 mg via INTRAVENOUS
  Filled 2020-02-07: qty 83.34

## 2020-02-07 MED ORDER — INSULIN ASPART 100 UNIT/ML ~~LOC~~ SOLN
0.0000 [IU] | SUBCUTANEOUS | Status: DC
  Administered 2020-02-07 – 2020-02-12 (×16): 2 [IU] via SUBCUTANEOUS
  Administered 2020-02-12: 4 [IU] via SUBCUTANEOUS
  Administered 2020-02-13 – 2020-02-19 (×15): 2 [IU] via SUBCUTANEOUS
  Administered 2020-02-20: 8 [IU] via SUBCUTANEOUS
  Administered 2020-02-21 – 2020-03-04 (×35): 2 [IU] via SUBCUTANEOUS

## 2020-02-07 MED ORDER — COLCHICINE 0.3 MG HALF TABLET
0.3000 mg | ORAL_TABLET | Freq: Two times a day (BID) | ORAL | Status: DC
  Administered 2020-02-07 – 2020-02-10 (×7): 0.3 mg via ORAL
  Filled 2020-02-07 (×9): qty 1

## 2020-02-07 MED ORDER — AMIODARONE HCL IN DEXTROSE 360-4.14 MG/200ML-% IV SOLN
30.0000 mg/h | INTRAVENOUS | Status: DC
  Administered 2020-02-08 – 2020-02-15 (×17): 30 mg/h via INTRAVENOUS
  Filled 2020-02-07 (×18): qty 200

## 2020-02-07 MED ORDER — LEVALBUTEROL HCL 0.63 MG/3ML IN NEBU
0.6300 mg | INHALATION_SOLUTION | Freq: Four times a day (QID) | RESPIRATORY_TRACT | Status: DC | PRN
  Administered 2020-02-07 – 2020-02-12 (×4): 0.63 mg via RESPIRATORY_TRACT
  Filled 2020-02-07 (×4): qty 3

## 2020-02-07 MED ORDER — FUROSEMIDE 10 MG/ML IJ SOLN
40.0000 mg | Freq: Once | INTRAMUSCULAR | Status: AC
  Administered 2020-02-07: 40 mg via INTRAVENOUS
  Filled 2020-02-07: qty 4

## 2020-02-07 MED ORDER — CLOPIDOGREL BISULFATE 75 MG PO TABS
75.0000 mg | ORAL_TABLET | Freq: Every day | ORAL | Status: DC
  Administered 2020-02-07 – 2020-02-10 (×4): 75 mg via ORAL
  Filled 2020-02-07 (×5): qty 1

## 2020-02-07 MED ORDER — AMIODARONE HCL IN DEXTROSE 360-4.14 MG/200ML-% IV SOLN
60.0000 mg/h | INTRAVENOUS | Status: AC
  Administered 2020-02-07 (×2): 60 mg/h via INTRAVENOUS
  Filled 2020-02-07: qty 400

## 2020-02-07 MED ORDER — ASPIRIN EC 81 MG PO TBEC
81.0000 mg | DELAYED_RELEASE_TABLET | Freq: Every day | ORAL | Status: DC
  Administered 2020-02-07 – 2020-02-10 (×4): 81 mg via ORAL
  Filled 2020-02-07 (×4): qty 1

## 2020-02-07 MED ORDER — SODIUM CHLORIDE 0.9% IV SOLUTION: Freq: Once | INTRAVENOUS | Status: AC

## 2020-02-07 NOTE — Progress Notes (Signed)
EVENING ROUNDS NOTE :     [address]       [name] [address]             [phone]                 1 Day Post-Op Procedure(s) (LRB): CORONARY ARTERY BYPASS GRAFTING (CABG),  x Four with Bilateral IMAs, and right leg greater saphenous vein (N/A) TRANSESOPHAGEAL ECHOCARDIOGRAM (TEE) (N/A) INDOCYANINE GREEN FLUORESCENCE IMAGING (ICG) (N/A) CAROTID ENDARTERECTOMY LEFT with  Patch Angioplasty Using Hemashield Platinum Finesse patch (Left)   Total Length of Stay:  LOS: 1 day  Events: A. fib this evening. Some agitation. Amnio drip started, if patient has more agitation overnight then will restart the Precedex drip    BP 121/84   Pulse 76   Temp 97.7 F (36.5 C) (Oral)   Resp (!) 22   Ht 5\' 9"  (1.753 m)   Wt 59.7 kg   SpO2 98%   BMI 19.43 kg/m   PAP: (17-49)/(7-22) 37/19 CO:  [4.2 L/min-4.6 L/min] 4.4 L/min CI:  [2.5 L/min/m2-2.8 L/min/m2] 2.6 L/min/m2  Vent Mode: SIMV;PSV;PRVC FiO2 (%):  [40 %-50 %] 40 % Set Rate:  [4 bmp-12 bmp] 4 bmp PEEP:  [5 cmH20] 5 cmH20 Pressure Support:  [10 cmH20] 10 cmH20 Plateau Pressure:  [18 cmH20] 18 cmH20  . sodium chloride Stopped (02/07/20 1415)  . sodium chloride    . sodium chloride    . amiodarone     Followed by  . amiodarone    . cefUROXime (ZINACEF)  IV Stopped (02/07/20 0858)  . dexmedetomidine (PRECEDEX) IV infusion Stopped (02/06/20 1814)  . lactated ringers    . lactated ringers 20 mL/hr at 02/07/20 1700  . lactated ringers Stopped (02/06/20 1937)  . phenylephrine (NEO-SYNEPHRINE) Adult infusion 25 mcg/min (02/07/20 1700)    I/O last 3 completed shifts: In: 5009.1 [I.V.:2940.2; Blood:637; IV Piggyback:1432] Out: 5963 [Urine:4021; Drains:200; Blood:622; Chest Tube:1120]   CBC Latest Ref Rng & Units 02/07/2020 02/07/2020 02/07/2020  WBC 4.0 - 10.5 K/uL 10.8(H) - 6.7  Hemoglobin 13.0 - 17.0 g/dL 11.0(L) 5.8(LL) 7.5(L)  Hematocrit 39.0 - 52.0 % 32.5(L) 17.0(L) 22.9(L)  Platelets 150 - 400 K/uL 91(L)  - 99(L)    BMP Latest Ref Rng & Units 02/07/2020 02/07/2020 02/07/2020  Glucose 70 - 99 mg/dL 04/08/2020) - 350(K)  BUN 8 - 23 mg/dL 8 - 8  Creatinine 938(H - 1.24 mg/dL 8.29 - 9.37  BUN/Creat Ratio 10 - 24 - - -  Sodium 135 - 145 mmol/L 138 143 140  Potassium 3.5 - 5.1 mmol/L 4.3 4.0 3.9  Chloride 98 - 111 mmol/L 107 - 111  CO2 22 - 32 mmol/L 25 - 22  Calcium 8.9 - 10.3 mg/dL 7.8(L) - 7.6(L)    ABG    Component Value Date/Time   PHART 7.374 02/07/2020 0349   PCO2ART 43.2 02/07/2020 0349   PO2ART 163 (H) 02/07/2020 0349   HCO3 25.4 02/07/2020 0349   TCO2 27 02/07/2020 0349   ACIDBASEDEF 3.0 (H) 02/06/2020 2222   O2SAT 99.0 02/07/2020 0349       04/08/2020, MD 02/07/2020 5:34 PM

## 2020-02-07 NOTE — Anesthesia Postprocedure Evaluation (Signed)
Anesthesia Post Note  Patient: Tony Munoz  Procedure(s) Performed: CORONARY ARTERY BYPASS GRAFTING (CABG),  x Four with Bilateral IMAs, and right leg greater saphenous vein (N/A Chest) TRANSESOPHAGEAL ECHOCARDIOGRAM (TEE) (N/A ) INDOCYANINE GREEN FLUORESCENCE IMAGING (ICG) (N/A ) CAROTID ENDARTERECTOMY LEFT with  Patch Angioplasty Using Hemashield Platinum Finesse patch (Left Neck)     Patient location during evaluation: SICU Anesthesia Type: General Level of consciousness: sedated Pain management: pain level controlled Vital Signs Assessment: post-procedure vital signs reviewed and stable Respiratory status: patient remains intubated per anesthesia plan Cardiovascular status: stable Postop Assessment: no apparent nausea or vomiting Anesthetic complications: no    Last Vitals:  Vitals:   02/07/20 0900 02/07/20 1000  BP: 118/71 124/76  Pulse: 87 87  Resp: (!) 36 (!) 35  Temp: (!) 36.3 C (!) 36.4 C  SpO2: 100% 99%    Last Pain:  Vitals:   02/07/20 0845  TempSrc: Core  PainSc:                  Tony Munoz S

## 2020-02-07 NOTE — Progress Notes (Addendum)
  Progress Note    02/07/2020 7:29 AM 1 Day Post-Op  Subjective:  Pt awake and extubated.  Says he feels a little better.  Throat is dry but denies any trouble swallowing  Afebrile HR 80's-90's a paced 100's-120's systolic 100% 2LO2NC  Gtts: Insulin NTG Phenylephrine  Vitals:   02/07/20 0630 02/07/20 0645  BP:    Pulse: 86 83  Resp: (!) 37 (!) 30  Temp: (!) 97.3 F (36.3 C) (!) 97.3 F (36.3 C)  SpO2: 97% 99%     Physical Exam: Neuro:  In tact; tongue is midline and grips equal bilaterally.  Moving BLE equally as well. Lungs:  Non labored on 2LO2NC Incision:  With bandage with JP drain  CBC    Component Value Date/Time   WBC 6.7 02/07/2020 0316   RBC 2.31 (L) 02/07/2020 0316   HGB 5.8 (LL) 02/07/2020 0349   HGB 17.8 (H) 01/09/2020 1414   HCT 17.0 (L) 02/07/2020 0349   HCT 51.3 (H) 01/09/2020 1414   PLT 99 (L) 02/07/2020 0316   PLT 132 (L) 01/09/2020 1414   MCV 99.1 02/07/2020 0316   MCV 95 01/09/2020 1414   MCH 32.5 02/07/2020 0316   MCHC 32.8 02/07/2020 0316   RDW 13.1 02/07/2020 0316   RDW 12.4 01/09/2020 1414   LYMPHSABS 1.2 01/09/2020 1414   MONOABS 0.3 02/13/2019 1520   EOSABS 0.0 01/09/2020 1414   BASOSABS 0.0 01/09/2020 1414    BMET    Component Value Date/Time   NA 143 02/07/2020 0349   NA 146 (H) 01/09/2020 1414   K 4.0 02/07/2020 0349   CL 111 02/07/2020 0316   CO2 22 02/07/2020 0316   GLUCOSE 142 (H) 02/07/2020 0316   BUN 8 02/07/2020 0316   BUN 7 (L) 01/09/2020 1414   CREATININE 0.85 02/07/2020 0316   CALCIUM 7.6 (L) 02/07/2020 0316   GFRNONAA >60 02/07/2020 0316   GFRAA >60 02/07/2020 0316     Intake/Output Summary (Last 24 hours) at 02/07/2020 0729 Last data filed at 02/07/2020 0600 Gross per 24 hour  Intake 5009.12 ml  Output 5664 ml  Net -654.88 ml     Assessment/Plan:  This is a 64 y.o. male who is s/p left CEA with CABG 1 Day Post-Op  -pt is doing well this am. -pt neuro exam is in tact -acute surgical blood loss  anemia-transfuse per primary team -his JP drain has had 200cc out-will leave in place today. -post operative management per CT surgery -f/u with Dr. Darrick Penna a couple of weeks after discharge.   -mobilize per primary team   Doreatha Massed, PA-C [facility] [phone]  Agree with above.  Keep JP until less than 30 mL per 24 hr Neuro some hoarseness seems to be due to dryness/ET tube monitor for now Tongue midline, no facial asymmetry, UE/LE 5/5 motor No neck hematoma Continue to follow   Fabienne Bruns, MD [facility] Office: [phone]

## 2020-02-07 NOTE — Progress Notes (Signed)
1 Day Post-Op Procedure(s) (LRB): CORONARY ARTERY BYPASS GRAFTING (CABG),  x Four with Bilateral IMAs, and right leg greater saphenous vein (N/A) TRANSESOPHAGEAL ECHOCARDIOGRAM (TEE) (N/A) INDOCYANINE GREEN FLUORESCENCE IMAGING (ICG) (N/A) CAROTID ENDARTERECTOMY LEFT with  Patch Angioplasty Using Hemashield Platinum Finesse patch (Left) Subjective: thirsty  Objective: Vital signs in last 24 hours: Temp:  [96.4 F (35.8 C)-97.5 F (36.4 C)] 97.3 F (36.3 C) (06/02 0645) Pulse Rate:  [65-110] 83 (06/02 0645) Cardiac Rhythm: Atrial paced (06/02 0000) Resp:  [12-48] 30 (06/02 0645) BP: (105-127)/(56-85) 122/73 (06/02 0600) SpO2:  [88 %-100 %] 99 % (06/02 0645) Arterial Line BP: (91-249)/(24-76) 101/53 (06/02 0645) FiO2 (%):  [40 %-50 %] 40 % (06/01 2024) Weight:  [59.7 kg] 59.7 kg (06/02 0500)  Hemodynamic parameters for last 24 hours: PAP: (17-49)/(7-22) 30/15 CO:  [2.9 L/min-4.6 L/min] 4.4 L/min CI:  [1.7 L/min/m2-2.8 L/min/m2] 2.6 L/min/m2  Intake/Output from previous day: 06/01 0701 - 06/02 0700 In: 5009.1 [I.V.:2940.2; Blood:637; IV Piggyback:1432] Out: 5664 [Urine:3722; Drains:200; Blood:622; Chest Tube:1120] Intake/Output this shift: No intake/output data recorded.  General appearance: alert and cooperative Neurologic: intact Heart: regular rate and rhythm, S1, S2 normal, no murmur, click, rub or gallop Lungs: clear to auscultation bilaterally Extremities: extremities normal, atraumatic, no cyanosis or edema Wound: dressed, dry  Lab Results: Recent Labs    02/06/20 2227 02/06/20 2227 02/07/20 0316 02/07/20 0349  WBC 5.4  --  6.7  --   HGB 7.3*   < > 7.5* 5.8*  HCT 22.3*   < > 22.9* 17.0*  PLT 92*  --  99*  --    < > = values in this interval not displayed.   BMET:  Recent Labs    02/06/20 1956 02/06/20 2111 02/07/20 0316 02/07/20 0349  NA 142   < > 140 143  K 4.2   < > 3.9 4.0  CL 111  --  111  --   CO2 23  --  22  --   GLUCOSE 118*  --  142*  --    BUN 7*  --  8  --   CREATININE 0.78  --  0.85  --   CALCIUM 7.2*  --  7.6*  --    < > = values in this interval not displayed.    PT/INR:  Recent Labs    02/06/20 1528  LABPROT 20.3*  INR 1.8*   ABG    Component Value Date/Time   PHART 7.374 02/07/2020 0349   HCO3 25.4 02/07/2020 0349   TCO2 27 02/07/2020 0349   ACIDBASEDEF 3.0 (H) 02/06/2020 2222   O2SAT 99.0 02/07/2020 0349   CBG (last 3)  Recent Labs    02/06/20 1859 02/06/20 2001 02/06/20 2219  GLUCAP 114* 122* 185*    Assessment/Plan: S/P Procedure(s) (LRB): CORONARY ARTERY BYPASS GRAFTING (CABG),  x Four with Bilateral IMAs, and right leg greater saphenous vein (N/A) TRANSESOPHAGEAL ECHOCARDIOGRAM (TEE) (N/A) INDOCYANINE GREEN FLUORESCENCE IMAGING (ICG) (N/A) CAROTID ENDARTERECTOMY LEFT with  Patch Angioplasty Using Hemashield Platinum Finesse patch (Left) Mobilize d/c PA cath  D/c cervical drain oob to chair 2 units Prbc D/c NTG Ok to start beta blocker   LOS: 1 day    [name] 02/07/2020

## 2020-02-08 ENCOUNTER — Inpatient Hospital Stay (HOSPITAL_COMMUNITY): Payer: Medicare HMO

## 2020-02-08 DIAGNOSIS — E43 Unspecified severe protein-calorie malnutrition: Secondary | ICD-10-CM | POA: Insufficient documentation

## 2020-02-08 DIAGNOSIS — J441 Chronic obstructive pulmonary disease with (acute) exacerbation: Secondary | ICD-10-CM

## 2020-02-08 LAB — GLUCOSE, CAPILLARY
Glucose-Capillary: 91 mg/dL (ref 70–99)
Glucose-Capillary: 103 mg/dL — ABNORMAL HIGH (ref 70–99)
Glucose-Capillary: 108 mg/dL — ABNORMAL HIGH (ref 70–99)
Glucose-Capillary: 129 mg/dL — ABNORMAL HIGH (ref 70–99)
Glucose-Capillary: 158 mg/dL — ABNORMAL HIGH (ref 70–99)

## 2020-02-08 LAB — BPAM RBC
Blood Product Expiration Date: 202106072359
Blood Product Expiration Date: 202106232359
ISSUE DATE / TIME: 202106020823
ISSUE DATE / TIME: 202106021135
Unit Type and Rh: 6200
Unit Type and Rh: 6200

## 2020-02-08 LAB — TYPE AND SCREEN
ABO/RH(D): A POS
Antibody Screen: NEGATIVE
Unit division: 0
Unit division: 0

## 2020-02-08 LAB — POCT I-STAT 7, (LYTES, BLD GAS, ICA,H+H)
Acid-Base Excess: 0 mmol/L (ref 0.0–2.0)
Bicarbonate: 27.2 mmol/L (ref 20.0–28.0)
Calcium, Ion: 1.22 mmol/L (ref 1.15–1.40)
HCT: 33 % — ABNORMAL LOW (ref 39.0–52.0)
Hemoglobin: 11.2 g/dL — ABNORMAL LOW (ref 13.0–17.0)
O2 Saturation: 94 %
Patient temperature: 98.7
Potassium: 4.3 mmol/L (ref 3.5–5.1)
Sodium: 138 mmol/L (ref 135–145)
TCO2: 29 mmol/L (ref 22–32)
pCO2 arterial: 56.5 mmHg — ABNORMAL HIGH (ref 32.0–48.0)
pH, Arterial: 7.291 — ABNORMAL LOW (ref 7.350–7.450)
pO2, Arterial: 83 mmHg (ref 83.0–108.0)

## 2020-02-08 LAB — BASIC METABOLIC PANEL
Anion gap: 11 (ref 5–15)
BUN: 13 mg/dL (ref 8–23)
CO2: 24 mmol/L (ref 22–32)
Calcium: 7.4 mg/dL — ABNORMAL LOW (ref 8.9–10.3)
Chloride: 104 mmol/L (ref 98–111)
Creatinine, Ser: 0.95 mg/dL (ref 0.61–1.24)
GFR calc Af Amer: >60 mL/min (ref >60)
GFR calc non Af Amer: >60 mL/min (ref >60)
Glucose, Bld: 101 mg/dL — ABNORMAL HIGH (ref 70–99)
Potassium: 4.3 mmol/L (ref 3.5–5.1)
Sodium: 139 mmol/L (ref 135–145)

## 2020-02-08 LAB — CBC
HCT: 31.2 % — ABNORMAL LOW (ref 39.0–52.0)
Hemoglobin: 10.5 g/dL — ABNORMAL LOW (ref 13.0–17.0)
MCH: 31.6 pg (ref 26.0–34.0)
MCHC: 33.7 g/dL (ref 30.0–36.0)
MCV: 94 fL (ref 80.0–100.0)
Platelets: 84 10*3/uL — ABNORMAL LOW (ref 150–400)
RBC: 3.32 MIL/uL — ABNORMAL LOW (ref 4.22–5.81)
RDW: 16.6 % — ABNORMAL HIGH (ref 11.5–15.5)
WBC: 10.4 10*3/uL (ref 4.0–10.5)
nRBC: 0 % (ref 0.0–0.2)

## 2020-02-08 MED ORDER — PANTOPRAZOLE SODIUM 40 MG IV SOLR: 40.0000 mg | Freq: Two times a day (BID) | INTRAVENOUS | Status: DC

## 2020-02-08 MED ORDER — LORAZEPAM 2 MG/ML IJ SOLN: 0.0000 mg | Freq: Two times a day (BID) | INTRAMUSCULAR | Status: DC

## 2020-02-08 MED ORDER — THIAMINE HCL 100 MG/ML IJ SOLN
100.0000 mg | Freq: Every day | INTRAMUSCULAR | Status: DC
  Administered 2020-02-12: 100 mg via INTRAVENOUS
  Filled 2020-02-08 (×2): qty 2

## 2020-02-08 MED ORDER — FUROSEMIDE 10 MG/ML IJ SOLN
40.0000 mg | Freq: Two times a day (BID) | INTRAMUSCULAR | Status: DC
  Administered 2020-02-08 (×2): 40 mg via INTRAVENOUS
  Filled 2020-02-08 (×2): qty 4

## 2020-02-08 MED ORDER — THIAMINE HCL 100 MG PO TABS
100.0000 mg | ORAL_TABLET | Freq: Every day | ORAL | Status: DC
  Administered 2020-02-09 – 2020-02-11 (×3): 100 mg via ORAL
  Filled 2020-02-08 (×4): qty 1

## 2020-02-08 MED ORDER — LORAZEPAM 1 MG PO TABS: 1.0000 mg | ORAL_TABLET | ORAL | Status: DC | PRN

## 2020-02-08 MED ORDER — ACETAMINOPHEN 10 MG/ML IV SOLN
1000.0000 mg | Freq: Once | INTRAVENOUS | Status: AC
  Administered 2020-02-08: 1000 mg via INTRAVENOUS
  Filled 2020-02-08: qty 100

## 2020-02-08 MED ORDER — IPRATROPIUM-ALBUTEROL 0.5-2.5 (3) MG/3ML IN SOLN
3.0000 mL | RESPIRATORY_TRACT | Status: DC
  Administered 2020-02-08 – 2020-02-09 (×2): 3 mL via RESPIRATORY_TRACT
  Filled 2020-02-08 (×2): qty 3

## 2020-02-08 MED ORDER — LEVALBUTEROL HCL 0.63 MG/3ML IN NEBU: 0.6300 mg | INHALATION_SOLUTION | Freq: Four times a day (QID) | RESPIRATORY_TRACT | Status: DC

## 2020-02-08 MED ORDER — METHYLPREDNISOLONE SODIUM SUCC 125 MG IJ SOLR
60.0000 mg | Freq: Two times a day (BID) | INTRAMUSCULAR | Status: AC
  Administered 2020-02-08 – 2020-02-10 (×4): 60 mg via INTRAVENOUS
  Filled 2020-02-08 (×4): qty 2

## 2020-02-08 MED ORDER — MIDAZOLAM HCL 2 MG/2ML IJ SOLN
INTRAMUSCULAR | Status: AC
  Filled 2020-02-08: qty 2

## 2020-02-08 MED ORDER — LORAZEPAM 2 MG/ML IJ SOLN
0.0000 mg | Freq: Four times a day (QID) | INTRAMUSCULAR | Status: DC
  Administered 2020-02-08 – 2020-02-09 (×3): 2 mg via INTRAVENOUS
  Filled 2020-02-08 (×4): qty 1

## 2020-02-08 MED ORDER — FOLIC ACID 1 MG PO TABS
1.0000 mg | ORAL_TABLET | Freq: Every day | ORAL | Status: DC
  Administered 2020-02-09: 1 mg via ORAL
  Filled 2020-02-08 (×3): qty 1

## 2020-02-08 MED ORDER — ENSURE ENLIVE PO LIQD
237.0000 mL | Freq: Three times a day (TID) | ORAL | Status: DC
  Administered 2020-02-09 – 2020-02-10 (×3): 237 mL via ORAL

## 2020-02-08 MED ORDER — ADULT MULTIVITAMIN W/MINERALS CH
1.0000 | ORAL_TABLET | Freq: Every day | ORAL | Status: DC
  Administered 2020-02-09 – 2020-02-10 (×2): 1 via ORAL
  Filled 2020-02-08 (×4): qty 1

## 2020-02-08 MED ORDER — METHYLPREDNISOLONE SODIUM SUCC 40 MG IJ SOLR
40.0000 mg | INTRAMUSCULAR | Status: AC
  Administered 2020-02-11 – 2020-02-12 (×2): 40 mg via INTRAVENOUS
  Filled 2020-02-08 (×2): qty 1

## 2020-02-08 MED ORDER — FUROSEMIDE 10 MG/ML IJ SOLN
6.0000 mg/h | INTRAVENOUS | Status: DC
  Filled 2020-02-08: qty 25

## 2020-02-08 MED ORDER — LORAZEPAM 2 MG/ML IJ SOLN
1.0000 mg | INTRAMUSCULAR | Status: DC | PRN
  Administered 2020-02-08 (×3): 2 mg via INTRAVENOUS
  Filled 2020-02-08 (×2): qty 1

## 2020-02-08 MED ORDER — FENTANYL CITRATE (PF) 100 MCG/2ML IJ SOLN
INTRAMUSCULAR | Status: AC
  Filled 2020-02-08: qty 2

## 2020-02-08 MED ORDER — PANTOPRAZOLE SODIUM 40 MG IV SOLR
40.0000 mg | INTRAVENOUS | Status: DC
  Administered 2020-02-08 – 2020-03-02 (×24): 40 mg via INTRAVENOUS
  Filled 2020-02-08 (×24): qty 40

## 2020-02-08 MED ORDER — METHYLPREDNISOLONE SODIUM SUCC 40 MG IJ SOLR
40.0000 mg | Freq: Two times a day (BID) | INTRAMUSCULAR | Status: AC
  Administered 2020-02-10 – 2020-02-11 (×2): 40 mg via INTRAVENOUS
  Filled 2020-02-08 (×2): qty 1

## 2020-02-08 NOTE — Progress Notes (Addendum)
Progress Note    02/08/2020 7:19 AM 2 Days Post-Op  Subjective:  OOB to chair. Says hoarseness is some better. Swallowing without difficulty   Vitals:   02/08/20 0630 02/08/20 0645  BP: 100/85 113/80  Pulse: 95 94  Resp: (!) 34   SpO2: 97% 97%    Physical Exam: Cardiac:  RRR Lungs:  Decreased breath sounds. Non-labored Incisions:  Left neck incision well approximated. No hematoma. (Drain out) Extremities: 5/5 bilateral grip and dorsiflexion/plantar flexion strength Neuro: A and O x 4. Tongue midline. Speech clear. Face symmetrical   CBC    Component Value Date/Time   WBC 10.4 02/08/2020 0308   RBC 3.32 (L) 02/08/2020 0308   HGB 10.5 (L) 02/08/2020 0308   HGB 17.8 (H) 01/09/2020 1414   HCT 31.2 (L) 02/08/2020 0308   HCT 51.3 (H) 01/09/2020 1414   PLT 84 (L) 02/08/2020 0308   PLT 132 (L) 01/09/2020 1414   MCV 94.0 02/08/2020 0308   MCV 95 01/09/2020 1414   MCH 31.6 02/08/2020 0308   MCHC 33.7 02/08/2020 0308   RDW 16.6 (H) 02/08/2020 0308   RDW 12.4 01/09/2020 1414   LYMPHSABS 1.2 01/09/2020 1414   MONOABS 0.3 02/13/2019 1520   EOSABS 0.0 01/09/2020 1414   BASOSABS 0.0 01/09/2020 1414    BMET    Component Value Date/Time   NA 139 02/08/2020 0308   NA 146 (H) 01/09/2020 1414   K 4.3 02/08/2020 0308   CL 104 02/08/2020 0308   CO2 24 02/08/2020 0308   GLUCOSE 101 (H) 02/08/2020 0308   BUN 13 02/08/2020 0308   BUN 7 (L) 01/09/2020 1414   CREATININE 0.95 02/08/2020 0308   CALCIUM 7.4 (L) 02/08/2020 0308   GFRNONAA >60 02/08/2020 0308   GFRAA >60 02/08/2020 0308     Intake/Output Summary (Last 24 hours) at 02/08/2020 0719 Last data filed at 02/08/2020 0600 Gross per 24 hour  Intake 2664.38 ml  Output 2591 ml  Net 73.38 ml    HOSPITAL MEDICATIONS Scheduled Meds: . acetaminophen  1,000 mg Oral Q6H   Or  . acetaminophen (TYLENOL) oral liquid 160 mg/5 mL  1,000 mg Per Tube Q6H  . aspirin EC  81 mg Oral Daily  . atorvastatin  10 mg Oral Daily  .  bisacodyl  10 mg Oral Daily   Or  . bisacodyl  10 mg Rectal Daily  . Chlorhexidine Gluconate Cloth  6 each Topical Daily  . clopidogrel  75 mg Oral Daily  . colchicine  0.3 mg Oral BID  . docusate sodium  200 mg Oral Daily  . insulin aspart  0-24 Units Subcutaneous Q4H  . metoprolol tartrate  12.5 mg Oral BID   Or  . metoprolol tartrate  12.5 mg Per Tube BID  . pantoprazole  40 mg Oral Daily  . sodium chloride flush  3 mL Intravenous Q12H   Continuous Infusions: . sodium chloride Stopped (02/07/20 1415)  . sodium chloride    . sodium chloride    . amiodarone 30 mg/hr (02/08/20 0604)  . cefUROXime (ZINACEF)  IV Stopped (02/07/20 2317)  . dexmedetomidine (PRECEDEX) IV infusion 0.4 mcg/kg/hr (02/08/20 5462)  . lactated ringers    . lactated ringers 20 mL/hr at 02/08/20 0600  . lactated ringers Stopped (02/06/20 1937)  . phenylephrine (NEO-SYNEPHRINE) Adult infusion 30 mcg/min (02/08/20 0600)   PRN Meds:.sodium chloride, lactated ringers, levalbuterol, metoprolol tartrate, morphine injection, ondansetron (ZOFRAN) IV, oxyCODONE, sodium chloride flush, traMADol  Assessment: POD 2 left CEA.  Neuro intact. Drain out. No neck hematoma  Plan: Continue current care plan as per CTS  -DVT prophylaxis:  SCDs   Wendi Maya, PA-C [facility] [phone] 02/08/2020  7:19 AM   Agree with above.  Will arrange follow up in 2 weeks. Call if questions.  Fabienne Bruns, MD [facility] Office: [phone]

## 2020-02-08 NOTE — Consult Note (Addendum)
NAME:  Tony Munoz, MRN:  [id number], DOB:  10-Jun-1956, LOS: 2 ADMISSION DATE:  02/06/2020, CONSULTATION DATE:  02/08/20 REFERRING MD:  Vickey Sages - CVTS, CHIEF COMPLAINT:  SOB, hypoxia   Brief History   64 yo M POD 3 CABG and L CEA, with progressive respiratory failure with hypoxia and hypercarbia   History of present illness   64 yo M with PMH CAD, HTN, who presented for CABG and  L CEA after workup for DOE (via stress test and LHC) demonstrated multivessel CAD. Pt had 80 % L ICA stenosis with contralateral occlusion. Pre-op eval reveals significant tobacco abuse, smoking 4 ppd for length of time, but recently with decrease to approx 1 pdd.   POD 2 (6/3) pt experienced progressive AMS and hypoxia. He was given morphine and ativan earlier in shift for tx of post-op pain and possible EtOH dts. Amidst pt hypoxic decompensation, pt received 40mg  Lasix empirically, with concern for possible flash pulmonary edema.  PCCM was asked to consult for progressive hypoxia. ABG at time of consult reveals hypoxic and hypercarbic respiratory failure.   CXR is pending at time of exam   Past Medical History  EtOH use  CAD  Anxiety Tobacco use disorder HTN  Significant Hospital Events   6/1 CABG and L CEA  6/2 receiving BZD for possible EtOH withdrawals. Was on precedex  6/3 worsening respiratory status with worsening hypoxia. Received 40 mg lasix. PCCM consulted   Consults:  Vascular surgery PCCM   Procedures:  6/1 CABG 6/1 L CEA   Significant Diagnostic Tests:  6/3 CXR >>>   Micro Data:    Antimicrobials:   Cefuroxime 6/1 (surg ppx)  Interim history/subjective:  PCCM consulted for AMS, hypoxia   Objective   Blood pressure 103/75, pulse 68, temperature 98.2 F (36.8 C), resp. rate 17, height 5\' 9"  (1.753 m), weight 60 kg, SpO2 99 %. CVP:  [15 mmHg-18 mmHg] 15 mmHg      Intake/Output Summary (Last 24 hours) at 02/08/2020 1806 Last data filed at 02/08/2020 1800 Gross per 24 hour  Intake  1748.98 ml  Output 2106 ml  Net -357.02 ml   Filed Weights   02/06/20 0617 02/07/20 0500 02/08/20 0500  Weight: 51.7 kg 59.7 kg 60 kg    Examination: General: Chronically ill appearing thin, older adult M, reclined in bed in mild distress  HENT: L CEA incision c/d/i. Tacky mm. Trachea midline, anicteric sclera  Lungs: Bilateral wheeze, scattered crackles and rhonchi, No accessory muscle use. Symmetrical chest expansion  Cardiovascular: rrr s1s2. Cap refill < 3 seconds  Abdomen: soft thin flat ndnt  Extremities: Low muscle mass BUE BLE. No obvious joint deformity.  Neuro: Awake, confused, answering questions and following questions intermittently. PERRL GU: foley with clear yellow urine  Skin: dry with + turgor. Scattered ecchymosis. Grey/dusky color.   Resolved Hospital Problem list     Assessment & Plan:   Acute encephalopathy -hypoxia, hypercarbia, possible medication related in setting of CIWA protocol meds and post-op pain meds -EtOH use disorder with possible EtOH withdrawals  P -CIWA   Acute hypoxic and hypercarbic respiratory failure -Hx tobacco use -possible COPD exacerbation, possible component of post-op atelectasis. Wheezing + crackles on exam, likely element of pulm edema P -IV solumedrol -BDs  -BiPAP to see if mentation improves/ hypercarbia improves  -Adequate response to 40mg  Lasix prior to consult, does not appear volume overloaded -- would defer diuresis and consider PRN  -follow up CXR  -IS/pulm hygiene  -NPO  for now in case of further decompensation prompting intubation  -would try to limit opioid meds, favor IV tylenol vs ketorolac   POD 2 CABG POD 2 left CEA  P -per CVTS and vascular, respectively  -monitor CT output and JP output  - ASA, plavix   HTN P -ICU monitoring -metop is ordered   Afib P -Amio -ICU monitoring   Best practice:  Diet: NPO  Pain/Anxiety/Delirium protocol (if indicated): PRN per primary  VAP protocol (if  indicated): na DVT prophylaxis: per primary, post op  GI prophylaxis: protonix  Glucose control: SSI  Mobility: BR Code Status: Full  Family Communication: per primary  Disposition: ICU   Labs   CBC: Recent Labs  Lab 02/06/20 1956 02/06/20 2111 02/06/20 2227 02/07/20 0316 02/07/20 0349 02/07/20 1555 02/08/20 0308  WBC 4.4  --  5.4 6.7  --  10.8* 10.4  HGB 7.6*   < > 7.3* 7.5* 5.8* 11.0* 10.5*  HCT 23.3*   < > 22.3* 22.9* 17.0* 32.5* 31.2*  MCV 100.0  --  99.6 99.1  --  92.3 94.0  PLT 85*  --  92* 99*  --  91* 84*   < > = values in this interval not displayed.    Basic Metabolic Panel: Recent Labs  Lab 02/02/20 0956 02/06/20 0817 02/06/20 1408 02/06/20 1533 02/06/20 1956 02/06/20 2111 02/06/20 2222 02/07/20 0316 02/07/20 0349 02/07/20 1555 02/08/20 0308  NA 137   < > 142   < > 142   < > 144 140 143 138 139  K 3.8   < > 4.5   < > 4.2   < > 4.1 3.9 4.0 4.3 4.3  CL 106   < > 104  --  111  --   --  111  --  107 104  CO2 21*  --   --   --  23  --   --  22  --  25 24  GLUCOSE 104*   < > 105*  --  118*  --   --  142*  --  110* 101*  BUN 10   < > 4*  --  7*  --   --  8  --  8 13  CREATININE 0.98   < > 0.50*  --  0.78  --   --  0.85  --  0.83 0.95  CALCIUM 8.7*  --   --   --  7.2*  --   --  7.6*  --  7.8* 7.4*  MG  --   --   --   --  3.3*  --   --  2.4  --  2.4  --    < > = values in this interval not displayed.   GFR: Estimated Creatinine Clearance: 65.8 mL/min (by C-G formula based on SCr of 0.95 mg/dL). Recent Labs  Lab 02/06/20 2227 02/07/20 0316 02/07/20 1555 02/08/20 0308  WBC 5.4 6.7 10.8* 10.4    Liver Function Tests: Recent Labs  Lab 02/02/20 0956  AST 22  ALT 16  ALKPHOS 88  BILITOT 0.8  PROT 6.3*  ALBUMIN 3.7   No results for input(s): LIPASE, AMYLASE in the last 168 hours. No results for input(s): AMMONIA in the last 168 hours.  ABG    Component Value Date/Time   PHART 7.374 02/07/2020 0349   PCO2ART 43.2 02/07/2020 0349   PO2ART  163 (H) 02/07/2020 0349   HCO3 25.4 02/07/2020 0349   TCO2 27  02/07/2020 0349   ACIDBASEDEF 3.0 (H) 02/06/2020 2222   O2SAT 99.0 02/07/2020 0349     Coagulation Profile: Recent Labs  Lab 02/02/20 0956 02/06/20 1528  INR 1.0 1.8*    Cardiac Enzymes: No results for input(s): CKTOTAL, CKMB, CKMBINDEX, TROPONINI in the last 168 hours.  HbA1C: Hgb A1c MFr Bld  Date/Time Value Ref Range Status  02/02/2020 09:57 AM 5.2 4.8 - 5.6 % Final    Comment:    (NOTE) Pre diabetes:          5.7%-6.4% Diabetes:              >6.4% Glycemic control for   <7.0% adults with diabetes     CBG: Recent Labs  Lab 02/07/20 1945 02/08/20 0301 02/08/20 0759 02/08/20 1120 02/08/20 1533  GLUCAP 122* 91 103* 108* 129*    Review of Systems:   Endorses fatigue, SOB, pain local to his operative sites   Past Medical History  He,  has a past medical history of Abnormal EKG, Anxiety, Bilateral carotid bruits (12/20/2019), Carotid artery occlusion, Cigarette smoker (12/20/2019), Coronary artery disease, DOE (dyspnea on exertion) (12/20/2019), Dyspnea, Essential hypertension (12/20/2019), Prominent abdominal aortic pulsation (12/20/2019), and Radiculopathy (02/15/2019).   Surgical History    Past Surgical History:  Procedure Laterality Date  . ANTERIOR CERVICAL DECOMPRESSION/DISCECTOMY FUSION 4 LEVELS Bilateral 02/15/2019   Procedure: ANTERIOR CERVICAL DECOMPRESSION FUSION, CERVICAL FOUR-FIVE, CERVICAL FIVE-SIX, CERVICAL SIX-SEVEN WITH INSTRUMENTATION AND ALLOGRAFT;  Surgeon: Phylliss Bob, MD;  Location: [facility];  Service: Orthopedics;  Laterality: Bilateral;  . BACK SURGERY    . CORONARY ARTERY BYPASS GRAFT N/A 02/06/2020   Procedure: CORONARY ARTERY BYPASS GRAFTING (CABG),  x Four with Bilateral IMAs, and right leg greater saphenous vein;  Surgeon: Wonda Olds, MD;  Location: [facility];  Service: Open Heart Surgery;  Laterality: N/A;  . ENDARTERECTOMY Left 02/06/2020   Procedure: CAROTID ENDARTERECTOMY  LEFT with  Patch Angioplasty Using Hemashield Platinum Finesse patch;  Surgeon: Elam Dutch, MD;  Location: [facility];  Service: Vascular;  Laterality: Left;  . EYE SURGERY Bilateral    Cataracts  . HERNIA REPAIR     double hernia - 43yrs ago  . LEFT HEART CATH AND CORONARY ANGIOGRAPHY N/A 01/19/2020   Procedure: LEFT HEART CATH AND CORONARY ANGIOGRAPHY;  Surgeon: Belva Crome, MD;  Location: [facility];  Service: Cardiovascular;  Laterality: N/A;  . TEE WITHOUT CARDIOVERSION N/A 02/06/2020   Procedure: TRANSESOPHAGEAL ECHOCARDIOGRAM (TEE);  Surgeon: Wonda Olds, MD;  Location: [facility];  Service: Open Heart Surgery;  Laterality: N/A;  . TONSILLECTOMY       Social History   reports that he has been smoking cigarettes. He has a 75.00 pack-year smoking history. He has never used smokeless tobacco. He reports current alcohol use. He reports that he does not use drugs.   Family History   His family history is not on file.   Allergies No Known Allergies   Home Medications  Prior to Admission medications   Medication Sig Start Date End Date Taking? Authorizing Provider  Ascorbic Acid (VITAMIN C) 500 MG CAPS Take 500 mg by mouth daily.    Yes [provider]  aspirin EC 81 MG tablet Take 81 mg by mouth daily.   Yes [provider]  Javier Docker Oil (OMEGA-3) 500 MG CAPS Take 500 mg by mouth daily.   Yes [provider]  lisinopril (ZESTRIL) 2.5 MG tablet Take 2.5 mg by mouth daily.   Yes  [provider]  metoprolol succinate (TOPROL-XL) 50 MG 24 hr tablet Take 1 tablet (50 mg total) by mouth daily. Take with or immediately following a meal. Patient taking differently: Take 50 mg by mouth every other day. Take with or immediately following a meal. 01/09/20 04/08/20 Yes Revankar, Aundra Dubin, MD  Multiple Vitamins-Minerals (MULTIVITAMIN WITH MINERALS) tablet Take 1 tablet by mouth daily. Men 50 +   Yes [provider]  nitroGLYCERIN (NITROSTAT) 0.4 MG  SL tablet Place 1 tablet (0.4 mg total) under the tongue every 5 (five) minutes as needed for chest pain. 01/03/20 04/02/20 Yes Revankar, Aundra Dubin, MD  traMADol (ULTRAM) 50 MG tablet Take 50 mg by mouth every 6 (six) hours as needed for moderate pain.    Yes [provider]     Critical care time: 22    CRITICAL CARE Performed by: Lanier Clam   Total critical care time: 45 minutes  Critical care time was exclusive of separately billable procedures and treating other patients.  Critical care was necessary to treat or prevent imminent or life-threatening deterioration.  Critical care was time spent personally by me on the following activities: development of treatment plan with patient and/or surrogate as well as nursing, discussions with consultants, evaluation of patient's response to treatment, examination of patient, obtaining history from patient or surrogate, ordering and performing treatments and interventions, ordering and review of laboratory studies, ordering and review of radiographic studies, pulse oximetry and re-evaluation of patient's condition.    Tessie Fass MSN, AGACNP-BC [facility] [phone] If no answer, [phone] 02/08/2020, 6:06 PM

## 2020-02-08 NOTE — Plan of Care (Signed)
°  Problem: Cardiac: °Goal: Will achieve and/or maintain hemodynamic stability °Outcome: Progressing °  °

## 2020-02-08 NOTE — Op Note (Signed)
CARDIOTHORACIC SURGERY OPERATIVE NOTE  Date of Procedure: 02/06/20 Preoperative Diagnosis: Severe 3-vessel Coronary Artery Disease, cerebrovascular disease  Postoperative Diagnosis: Same  Procedure:    Left Carotid endarterectomy  Coronary Artery Bypass Grafting x 4  Left Internal Mammary Artery to Distal Left Anterior Descending Coronary Artery; pedicled RIMA Graft to  Posterior Descending Coronary Artery; Saphenous Vein Graft to 1st Diagonal Branch Coronary Artery; Sapheonous Vein Graft to 2nd Diagonal Branch Coronary Artery; Endoscopic Vein Harvest from right Thigh and Lower Leg  Surgeon: B. Murvin Natal, MD  Assistant: Jadene Pierini PA-C  Anesthesia: get  Operative Findings:  Mildly reduced left ventricular systolic function  good quality left internal mammary artery conduit  good quality saphenous vein conduit  good quality target vessels for grafting    BRIEF CLINICAL NOTE AND INDICATIONS FOR SURGERY  64 yo man presented with increased fatigue and DOE. His work-up found severe multivessel CAD and cerebrovascular disease. He was referred for CABG and combined carotid endartectomy.    DETAILS OF THE OPERATIVE PROCEDURE  Preparation:  The patient is brought to the operating room on the above mentioned date and central monitoring was established by the anesthesia team including placement of Swan-Ganz catheter and radial arterial line. The patient is placed in the supine position on the operating table.  Intravenous antibiotics are administered. General endotracheal anesthesia is induced uneventfully. A Foley catheter is placed.  Baseline transesophageal echocardiogram was performed.  Findings were notable for mild to moderate mitral valve regurgitation.   The patient's neck, chest, abdomen, both groins, and both lower extremities are prepared and draped in a sterile manner. A time out procedure is performed.  Attention is first turned to left carotid endarterectomy,  performed by Dr. Oneida Alar. See his operative dictation.    Surgical Approach and Conduit Harvest:  A median sternotomy incision was performed and the left internal mammary artery is dissected from the chest wall and prepared for bypass grafting. The left internal mammary artery is notably good quality conduit. Simultaneously, the greater saphenous vein is obtained from the patient's right lower leg using endoscopic vein harvest technique. The saphenous vein is notably good quality conduit. After removal of the saphenous vein, the small surgical incisions in the lower extremity are closed with absorbable suture. Attention is turned to the right chest wall where the RIMA is harvested. Following systemic heparinization, the internal mammary arteries were transected distally noted to have excellent flow. They were treated with papaverine. Multilevel rib block was undertaken by injecting Exparel solution directly into the rib spaces.    Extracorporeal Cardiopulmonary Bypass and Myocardial Protection:  The pericardium is opened. The ascending aorta is nondiseased in appearance. The ascending aorta and the right atrium are cannulated for cardiopulmonary bypass.  Adequate heparinization is verified.   The entire pre-bypass portion of the operation was notable for stable hemodynamics.  Cardiopulmonary bypass was begun and the surface of the heart is inspected. Distal target vessels are selected for coronary artery bypass grafting. A cardioplegia cannula is placed in the ascending aorta.    The patient is allowed to cool passively to 34C systemic temperature.  The aortic cross clamp is applied and cold blood cardioplegia is delivered initially in an antegrade fashion through the aortic root.  Iced saline slush is applied for topical hypothermia.  The initial cardioplegic arrest is rapid with early diastolic arrest.  Repeat doses of cardioplegia are administered intermittently throughout the entire cross clamp  portion of the operation through the aortic root and through subsequently placed  vein grafts in order to maintain completely flat electrocardiogram.   Coronary Artery Bypass Grafting:   The posterior descending branch of the right coronary artery was grafted using the pedicled RIMA  graft in an end-to-side fashion.  At the site of distal anastomosis the target vessel was good quality and measured approximately 1.5 mm in diameter.   The 2nd diagonal branch of the left anterior descending coronary artery was grafted using a reversed saphenous vein graft in an end-to-side fashion.  At the site of distal anastomosis the target vessel was good quality and measured approximately 1.5 mm in diameter.  The 1st diagonal branch of the LAD coronary artery was grafted in and end-to-side fashion.   At the site of distal anastomosis the target vessel was good quality and measured approximately 1.5 mm in diameter.  The distal left anterior coronary artery was grafted with the left internal mammary artery in an end-to-side fashion.  At the site of distal anastomosis the target vessel was good quality and measured approximately 1.5 mm in diameter. Anastomotic patency and runoff was confirmed with indocyanine green fluorescence imaging (SPY).  All proximal vein graft anastomoses were placed directly to the ascending aorta prior to removal of the aortic cross clamp. Deairing procedures were performed and the aortic cross clamp was removed.   Procedure Completion:  All proximal and distal coronary anastomoses were inspected for hemostasis and appropriate graft orientation. Epicardial pacing wires are fixed to the right ventricular outflow tract and to the right atrial appendage. The patient is rewarmed to 37C temperature. The patient is weaned and disconnected from cardiopulmonary bypass.  The patient's rhythm at separation from bypass was sinus bradycardia.  The patient was weaned from cardiopulmonary bypass with  moderate inotropic support.   Followup transesophageal echocardiogram performed after separation from bypass revealed  no changes from the preoperative exam.  The aortic and venous cannula were removed uneventfully. Protamine was administered to reverse the anticoagulation. The mediastinum and pleural space were inspected for hemostasis and irrigated with saline solution. The mediastinum and bilateral pleural spaces were drained using fluted chest tubes placed through separate stab incisions inferiorly.  The soft tissues anterior to the aorta were reapproximated loosely. The sternum is closed with double strength sternal wire. The soft tissues anterior to the sternum were closed in multiple layers and the skin is closed with a running subcuticular skin closure.  The post-bypass portion of the operation was notable for stable rhythm and hemodynamics.    Disposition:  The patient tolerated the procedure well and is transported to the surgical intensive care in stable condition. There are no intraoperative complications. All sponge instrument and needle counts are verified correct at completion of the operation.    Brantley Fling, MD 02/08/2020 11:05 PM

## 2020-02-08 NOTE — Progress Notes (Signed)
Dr Vickey Sages to bedside increased rate on pacer. Made aware of low urine output. Labs obtained.

## 2020-02-08 NOTE — Discharge Summary (Signed)
Physician Discharge Summary  Patient ID: Tony Munoz MRN: [id number] DOB/AGE: 04/30/1956 64 y.o.  Admit date: 02/06/2020 Discharge date: 03/04/2020  Admission Diagnoses:  Patient Active Problem List   Diagnosis Date Noted  . Carotid stenosis, asymptomatic, left 02/06/2020  . Coronary artery disease involving native coronary artery of native heart without angina pectoris   . Abnormal nuclear stress test 01/09/2020  . DOE (dyspnea on exertion) 12/20/2019  . Abnormal EKG 12/20/2019  . Essential hypertension 12/20/2019  . Bilateral carotid bruits 12/20/2019  . Cigarette smoker 12/20/2019  . Prominent abdominal aortic pulsation 12/20/2019  . Radiculopathy 02/15/2019   Discharge Diagnoses:   Coronary artery disease Bilateral carotid artery stenoses Abnormal nuclear stress test Dyspnea on exertion Hypertension Tobacco abuse History of EtOH abuse Chronic obstructive pulmonary disease Postoperative respiratory failure Postoperative PEA arrest followed by successful resuscitation Postoperative thrombocytopenia with resolution Status post tracheostomy Acute metabolic encephalopathy Cachexia, weakness History of radiculopathy Status post combined coronary artery bypass grafting x4 with concurrent left carotid endarterectomy with Dacron patch angioplasty   Discharged Condition: good  History of Present Illness:  Mr. Tony Munoz is a 64 yo male with history of HTN, Carotid Artery Stenosis, and nicotine abuse.  The patient developed complaints of increased shortness of breath.  He underwent evaluation which included a stress test which was positive.  Due to this he underwent a cardiac catheterization which showed multivessel CAD.  It was felt coronary bypass grafting would be indicated.  Preoperative workup showed significant carotid artery disease.  It was felt he would require carotid endarterectomy in addition to his coronary bypass surgery.  He was evaluated by Dr. Darrick Penna who was in  agreement.  The risks and benefits of a combined procedure were explained to the patient and he was agreeable to proceed.  Hospital Course:   Mr. Tony Munoz presented to [facility].  He was taken to the operating room and underwent left carotid endarterectomy by Dr. Darrick Penna of vascular surgery.  He also underwent CABG x 4 by Dr. Vickey Sages utilizing LIMA to LAD, RIMA to the posterior descending coronary artery and SVG to the first and second diagonal branches of the LAD.  He also underwent open harvest of greater saphenous from right lower leg.  He tolerated the procedure without difficulty and was taken to the SICU in stable condition.  The patient's cervical drain was removed on POD #1.  He was transfused 2 units of packed cells for expected post operative blood loss anemia.  He was weaned off NTG drip as hemodynamics allowed.  The patient developed Rapid Atrial Fibrillation and eventually converted back to sinus rhythm after initiation of amiodarone. He developed respiratory distress after routine extubation early postoperatively.  The pulmonary medicine/critical care team assisted with management.  Reintubation was required on postop day 3.  He extubated himself a few days later and again required reintubation postop day 5 and bronchoscopy was performed with removal of an aspirated tablet in the bronchus intermedius.  He developed evidence of alcohol withdrawal and was treated accordingly.His respiratory insufficiency persisted so tracheostomy was recommended and performed on 02/15/2020.  He was treated with multiple antibiotics for suspected aspiration pneumonia. He was coded on postop day 14 for profound hypotension.  He had return of spontaneous circulation after a second round of CPR and IV epinephrine.  He remained relatively stable following this event.  His nutrition was supported with tube feedings by way of a Dobbhoff tube.  He tolerated this well. Spontaneous breathing trials were conducted  periodically by the pulmonary care team.  He is continued to require significant ventilator support. His incisions healed with no evidence of complication.  Skin staples were removed prior to discharge.  Pacing wires were also removed without incident. It became apparent that he would require continued long-term care for ventilator and tracheostomy management.  He was evaluated by the [facility] staff at [facility] and was felt to be an appropriate patient for continued inpatient services at their facility.  A bed became available on 03/04/2020 and arrangements were made for transfer.     Consults: vascular surgery  Significant Diagnostic Studies: angiography:    Severe three-vessel coronary artery disease  Total occlusion of the mid RCA with left-to-right collaterals.  70 to 80% proximal LAD, 90% mid to distal LAD, 90% large first diagonal, 90% ostial moderate second diagonal, and 2 large septal perforators with greater than 90% obstruction.  Circumflex is relatively small and has diffuse disease but no focal high-grade obstruction.  Left main is widely patent with gentle 20% distal narrowing  Suggestion of inferior wall hypokinesis.  EF 45 to 50%.  LVEDP is normal.  Known severe bilateral carotid obstruction (greater than 85% left and total occlusion right).  Treatments: surgery:    Left Carotid endarterectomy  Coronary Artery Bypass Grafting x 4             Left Internal Mammary Artery to Distal Left Anterior Descending Coronary Artery; pedicled RIMA Graft to  Posterior Descending Coronary Artery; Saphenous Vein Graft to 1st Diagonal Branch Coronary Artery; Sapheonous Vein Graft to 2nd Diagonal Branch Coronary Artery; Endoscopic Vein Harvest from right Thigh and Lower Leg  Discharge Exam: Blood pressure (!) 96/56, pulse 78, temperature 98.7 F (37.1 C), temperature source Oral, resp. rate (!) 23, height 5\' 9"  (1.753 m), weight 59.3 kg, SpO2 100 %.   General appearance: alert and  cooperative Neurologic: intact Heart: regular rate and rhythm, S1, S2 normal, no murmur, click, rub or gallop Lungs: diminished breath sounds bilaterally Abdomen: soft, non-tender; bowel sounds normal; no masses,  no organomegaly Extremities: extremities normal, atraumatic, no cyanosis or edema Wound: c/d/i   Discharge Medications:   The patient has been discharged on:   1.Beta Blocker:  Yes [   ]                              No   [ x]                              If No, reason: Hypotension 2.Ace Inhibitor/ARB: Yes [   ]                                     No  [ x  ]                                     If No, reason: Hypotension  3.Statin:   Yes [  X ]                  No  [   ]                  If No, reason:  4.Ecasa:  Yes  [  X  ]                  No   [   ]                  If No, reason:   Allergies as of 03/04/2020   No Known Allergies     Medication List    STOP taking these medications   aspirin EC 81 MG tablet Replaced by: aspirin 81 MG chewable tablet   lisinopril 2.5 MG tablet Commonly known as: ZESTRIL   metoprolol succinate 50 MG 24 hr tablet Commonly known as: TOPROL-XL   multivitamin with minerals tablet   nitroGLYCERIN 0.4 MG SL tablet Commonly known as: NITROSTAT   Omega-3 500 MG Caps   traMADol 50 MG tablet Commonly known as: ULTRAM   Vitamin C 500 MG Caps     TAKE these medications   alum & mag hydroxide-simeth 200-200-20 MG/5ML suspension Commonly known as: MAALOX/MYLANTA Place 15 mLs into feeding tube every 6 (six) hours as needed for indigestion or heartburn.   amiodarone 200 MG tablet Commonly known as: PACERONE Place 1 tablet (200 mg total) into feeding tube 2 (two) times daily.   arformoterol 15 MCG/2ML Nebu Commonly known as: BROVANA Take 2 mLs (15 mcg total) by nebulization 2 (two) times daily.   aspirin 81 MG chewable tablet Place 1 tablet (81 mg total) into feeding tube daily. Start taking on: March 05, 2020 Replaces: aspirin EC 81 MG tablet   atorvastatin 10 MG tablet Commonly known as: LIPITOR Place 1 tablet (10 mg total) into feeding tube daily. Start taking on: March 05, 2020   budesonide 0.25 MG/2ML nebulizer solution Commonly known as: PULMICORT Take 2 mLs (0.25 mg total) by nebulization 2 (two) times daily.   citalopram 20 MG tablet Commonly known as: CELEXA Place 1 tablet (20 mg total) into feeding tube daily. Start taking on: March 05, 2020   clonazePAM 2 MG tablet Commonly known as: KLONOPIN Place 1 tablet (2 mg total) into feeding tube 2 (two) times daily.   clopidogrel 75 MG tablet Commonly known as: PLAVIX Place 1 tablet (75 mg total) into feeding tube daily. Start taking on: March 05, 2020   collagenase ointment Commonly known as: SANTYL Apply topically daily. Start taking on: March 05, 2020   docusate 50 MG/5ML liquid Commonly known as: COLACE Place 20 mLs (200 mg total) into feeding tube daily. Start taking on: March 05, 2020   doxazosin 4 MG tablet Commonly known as: CARDURA Take 1 tablet (4 mg total) by mouth daily.   enoxaparin 40 MG/0.4ML injection Commonly known as: LOVENOX Inject 0.4 mLs (40 mg total) into the skin daily. Start taking on: March 05, 2020   feeding supplement (VITAL AF 1.2 CAL) Liqd Place 1,000 mLs into feeding tube continuous.   nutrition supplement (JUVEN) Pack Place 1 packet into feeding tube 2 (two) times daily between meals.   ferrous sulfate 300 (60 Fe) MG/5ML syrup Place 5 mLs (300 mg total) into feeding tube 2 (two) times daily with a meal.   finasteride 5 MG tablet Commonly known as: PROSCAR Take 1 tablet (5 mg total) by mouth daily. Start taking on: March 05, 3418   folic acid 1 MG tablet Commonly known as: FOLVITE Place 1 tablet (1 mg total) into feeding tube daily. Start taking on: March 05, 2020   insulin aspart 100 UNIT/ML injection Commonly known as: novoLOG Inject 0-24 Units into the skin every  4 (four)  hours.   melatonin 5 MG Tabs Place 1 tablet (5 mg total) into feeding tube at bedtime as needed (insomnia).   midodrine 5 MG tablet Commonly known as: PROAMATINE Place 1 tablet (5 mg total) into feeding tube 3 (three) times daily with meals.   multivitamin Liqd Place 15 mLs into feeding tube daily. Start taking on: March 05, 2020   ondansetron 4 MG/2ML Soln injection Commonly known as: ZOFRAN Inject 2 mLs (4 mg total) into the vein every 6 (six) hours as needed for nausea or vomiting.   oxyCODONE 5 MG/5ML solution Commonly known as: ROXICODONE Place 5 mLs (5 mg total) into feeding tube every 4 (four) hours as needed for severe pain.   pantoprazole sodium 40 mg/20 mL Pack Commonly known as: PROTONIX Place 20 mLs (40 mg total) into feeding tube daily. Start taking on: March 05, 2020   QUEtiapine 25 MG tablet Commonly known as: SEROQUEL Place 1 tablet (25 mg total) into feeding tube 2 (two) times daily.   revefenacin 175 MCG/3ML nebulizer solution Commonly known as: YUPELRI Take 3 mLs (175 mcg total) by nebulization daily. Start taking on: March 05, 2020   sodium chloride flush 0.9 % Soln Commonly known as: NS 10-40 mLs by Intracatheter route every 12 (twelve) hours.   sodium chloride flush 0.9 % Soln Commonly known as: NS 10-40 mLs by Intracatheter route as needed (flush).   thiamine 100 MG tablet Place 1 tablet (100 mg total) into feeding tube daily. Start taking on: March 05, 2020       Follow-up Information    Fields, Janetta Hora, MD.   Specialties: Vascular Surgery, Cardiology Why: 2-3 weeks. The office will call the patient with an appointment(sent) Contact information: [address] [place] [address] [phone]        [name], MD. Call.   Specialty: Cardiothoracic Surgery Why: Please call our office for follow up instructions before discharged from [facility]. Contact information: [address] [place]  [address] [phone]        Garwin Brothers, MD. Call.   Specialty: Cardiology Why: Please call for follow up appointment prrior to discharge from [facility] information: [address] [place] [address] [phone]               Signed: Leary Roca, PA-C 03/04/2020, 12:04 PM

## 2020-02-08 NOTE — Progress Notes (Signed)
Initial Nutrition Assessment  DOCUMENTATION CODES:   Severe malnutrition in context of chronic illness  INTERVENTION:    Ensure Enlive po TID, each supplement provides 350 kcal and 20 grams of protein  MVI with minerals   NUTRITION DIAGNOSIS:   Severe Malnutrition related to chronic illness(cerebrovascular disease) as evidenced by severe fat depletion, severe muscle depletion.  GOAL:   Patient will meet greater than or equal to 90% of their needs  MONITOR:   PO intake, Supplement acceptance, Weight trends, Labs, I & O's, Skin  REASON FOR ASSESSMENT:   Rounds    ASSESSMENT:   Patient with PMH significant for HTN, severe cerebrovascular disease, CAD, and s/p multilevel C-spine fusion 02/15/2019. Presents this admission for CABG surgery.   6/1- s/p CABG x4, left CEA  JP drain removed this am.   Pt denies a loss in appetite PTA. States he typically consumes two meals daily (skips breakfast) that consists of steak, chicken, and vegetables. Unable to elaborate further on daily meal intake. States he drinks occasionally- 3-4 glasses of bourbon every other day. Reports he has Ensure at home but has not consumed them since his back surgery a year ago. Denies issues with swallowing. Uses dentures (fit appropriately). Discussed the importance of protein intake for preservation of lean body mass and to promote post-op healing. Okay to have Ensure this admission.   Endorses a UBW of 145 lb (last time at that weight was 2.5 years ago). Knows that he has lost weight recently but is unsure of how much. Records indicate pt has maintained his weight over the last year though suspect current weight reflects fluid accumulation.   I/O: -1,570 ml since admit  UOP: 1,821 ml x 24 hrs  Chest tubes: 760 ml x 24 hrs   Medications: dulcolax, colace, folic acid, 40 mg lasix BID, SS novolog, thiamine Labs: CBG 91-122  NUTRITION - FOCUSED PHYSICAL EXAM:    Most Recent Value  Orbital Region   Moderate depletion  Upper Arm Region  Severe depletion  Thoracic and Lumbar Region  Unable to assess  Buccal Region  Severe depletion  Temple Region  Severe depletion  Clavicle Bone Region  Severe depletion  Clavicle and Acromion Bone Region  Severe depletion  Scapular Bone Region  Unable to assess  Dorsal Hand  Severe depletion  Patellar Region  Severe depletion  Anterior Thigh Region  Severe depletion  Posterior Calf Region  Severe depletion  Edema (RD Assessment)  Mild  Hair  Reviewed  Eyes  Reviewed  Mouth  Reviewed  Skin  Reviewed  Nails  Reviewed     Diet Order:   Diet Order    None      EDUCATION NEEDS:   Education needs have been addressed  Skin:  Skin Assessment: Skin Integrity Issues: Skin Integrity Issues:: Incisions Incisions: R leg, chest, neck  Last BM:  PTA  Height:   Ht Readings from Last 1 Encounters:  02/06/20 5\' 9"  (1.753 m)    Weight:   Wt Readings from Last 1 Encounters:  02/08/20 60 kg    BMI:  Body mass index is 19.53 kg/m.  Estimated Nutritional Needs:   Kcal:  1800-2000 kcal  Protein:  90-105 grams  Fluid:  >/= 1.8 L/day   04/09/20 RD, LDN Clinical Nutrition Pager listed in [facility]ION

## 2020-02-08 NOTE — Progress Notes (Addendum)
Patient ID: Tony Munoz, male   DOB: [birth year], 64 y.o.   MRN: [id number] EVENING ROUNDS NOTE :     [address]       [name] [address]             [phone]                 2 Days Post-Op Procedure(s) (LRB): CORONARY ARTERY BYPASS GRAFTING (CABG),  x Four with Bilateral IMAs, and right leg greater saphenous vein (N/A) TRANSESOPHAGEAL ECHOCARDIOGRAM (TEE) (N/A) INDOCYANINE GREEN FLUORESCENCE IMAGING (ICG) (N/A) CAROTID ENDARTERECTOMY LEFT with  Patch Angioplasty Using Hemashield Platinum Finesse patch (Left)  Total Length of Stay:  LOS: 2 days  BP 91/70   Pulse 89   Temp 98.8 F (37.1 C)   Resp (!) 27   Ht 5\' 9"  (1.753 m)   Wt 60 kg   SpO2 96%   BMI 19.53 kg/m   .Intake/Output      06/02 0701 - 06/03 0700 06/03 0701 - 06/04 0700   I.V. (mL/kg) 1688.3 (28.1) 494.9 (8.2)   Blood 776    IV Piggyback 200.1 100   Total Intake(mL/kg) 2664.4 (44.4) 594.9 (9.9)   Urine (mL/kg/hr) 1821 (1.3) 983 (1.4)   Drains 10    Blood     Chest Tube 760 540   Total Output 2591 1523   Net +73.4 -928.1          . sodium chloride Stopped (02/07/20 1415)  . sodium chloride    . sodium chloride    . acetaminophen 1,000 mg (02/08/20 1829)  . amiodarone 30 mg/hr (02/08/20 1829)  . furosemide (LASIX) infusion    . lactated ringers    . lactated ringers 20 mL/hr at 02/08/20 1800  . lactated ringers 20 mL/hr at 02/08/20 1831  . phenylephrine (NEO-SYNEPHRINE) Adult infusion Stopped (02/08/20 0733)     Lab Results  Component Value Date   WBC 10.4 02/08/2020   HGB 11.2 (L) 02/08/2020   HCT 33.0 (L) 02/08/2020   PLT 84 (L) 02/08/2020   GLUCOSE 101 (H) 02/08/2020   ALT 16 02/02/2020   AST 22 02/02/2020   NA 138 02/08/2020   K 4.3 02/08/2020   CL 104 02/08/2020   CREATININE 0.95 02/08/2020   BUN 13 02/08/2020   CO2 24 02/08/2020   INR 1.8 (H) 02/06/2020   HGBA1C 5.2 02/02/2020   Increased respirator distress Less responsive , treated for alcohol  withdrawel co2 retention A paced , has had a fib Ccm has seen and started on bipap Moves all extremities to command Right lung infiltrate progressive , ? Aspiration - discuss with ccm antibiotic  coverage  02/04/2020 MD  Beeper [phone] Office [phone] 02/08/2020 6:37 PM

## 2020-02-09 ENCOUNTER — Inpatient Hospital Stay (HOSPITAL_COMMUNITY): Payer: Medicare HMO

## 2020-02-09 DIAGNOSIS — J9600 Acute respiratory failure, unspecified whether with hypoxia or hypercapnia: Secondary | ICD-10-CM

## 2020-02-09 DIAGNOSIS — J9602 Acute respiratory failure with hypercapnia: Secondary | ICD-10-CM

## 2020-02-09 DIAGNOSIS — J441 Chronic obstructive pulmonary disease with (acute) exacerbation: Secondary | ICD-10-CM

## 2020-02-09 LAB — BASIC METABOLIC PANEL
Anion gap: 10 (ref 5–15)
BUN: 14 mg/dL (ref 8–23)
CO2: 28 mmol/L (ref 22–32)
Calcium: 8 mg/dL — ABNORMAL LOW (ref 8.9–10.3)
Chloride: 99 mmol/L (ref 98–111)
Creatinine, Ser: 0.96 mg/dL (ref 0.61–1.24)
GFR calc Af Amer: >60 mL/min (ref >60)
GFR calc non Af Amer: >60 mL/min (ref >60)
Glucose, Bld: 150 mg/dL — ABNORMAL HIGH (ref 70–99)
Potassium: 3.5 mmol/L (ref 3.5–5.1)
Sodium: 137 mmol/L (ref 135–145)

## 2020-02-09 LAB — GLUCOSE, CAPILLARY
Glucose-Capillary: 119 mg/dL — ABNORMAL HIGH (ref 70–99)
Glucose-Capillary: 123 mg/dL — ABNORMAL HIGH (ref 70–99)
Glucose-Capillary: 131 mg/dL — ABNORMAL HIGH (ref 70–99)
Glucose-Capillary: 134 mg/dL — ABNORMAL HIGH (ref 70–99)
Glucose-Capillary: 134 mg/dL — ABNORMAL HIGH (ref 70–99)

## 2020-02-09 LAB — CBC
HCT: 30.9 % — ABNORMAL LOW (ref 39.0–52.0)
Hemoglobin: 10.2 g/dL — ABNORMAL LOW (ref 13.0–17.0)
MCH: 31 pg (ref 26.0–34.0)
MCHC: 33 g/dL (ref 30.0–36.0)
MCV: 93.9 fL (ref 80.0–100.0)
Platelets: 70 10*3/uL — ABNORMAL LOW (ref 150–400)
RBC: 3.29 MIL/uL — ABNORMAL LOW (ref 4.22–5.81)
RDW: 15.8 % — ABNORMAL HIGH (ref 11.5–15.5)
WBC: 5.6 10*3/uL (ref 4.0–10.5)
nRBC: 0 % (ref 0.0–0.2)

## 2020-02-09 MED ORDER — SODIUM CHLORIDE 0.9 % IV SOLN
3.0000 g | Freq: Four times a day (QID) | INTRAVENOUS | Status: DC
  Administered 2020-02-09 – 2020-02-17 (×32): 3 g via INTRAVENOUS
  Filled 2020-02-09 (×4): qty 3
  Filled 2020-02-09 (×4): qty 8
  Filled 2020-02-09 (×5): qty 3
  Filled 2020-02-09: qty 8
  Filled 2020-02-09 (×3): qty 3
  Filled 2020-02-09 (×2): qty 8
  Filled 2020-02-09 (×10): qty 3
  Filled 2020-02-09: qty 8
  Filled 2020-02-09 (×2): qty 3
  Filled 2020-02-09: qty 8
  Filled 2020-02-09 (×2): qty 3
  Filled 2020-02-09: qty 8

## 2020-02-09 MED ORDER — POTASSIUM CHLORIDE 10 MEQ/50ML IV SOLN
10.0000 meq | INTRAVENOUS | Status: AC
  Administered 2020-02-09 (×3): 10 meq via INTRAVENOUS
  Filled 2020-02-09 (×3): qty 50

## 2020-02-09 MED ORDER — LORAZEPAM 2 MG/ML IJ SOLN
1.0000 mg | INTRAMUSCULAR | Status: AC | PRN
  Administered 2020-02-09: 1 mg via INTRAVENOUS
  Filled 2020-02-09: qty 1

## 2020-02-09 MED ORDER — IPRATROPIUM-ALBUTEROL 0.5-2.5 (3) MG/3ML IN SOLN
3.0000 mL | Freq: Four times a day (QID) | RESPIRATORY_TRACT | Status: DC
  Administered 2020-02-09 – 2020-02-11 (×11): 3 mL via RESPIRATORY_TRACT
  Filled 2020-02-09 (×10): qty 3

## 2020-02-09 MED ORDER — ALBUMIN HUMAN 25 % IV SOLN
12.5000 g | Freq: Four times a day (QID) | INTRAVENOUS | Status: AC
  Administered 2020-02-09 – 2020-02-10 (×2): 12.5 g via INTRAVENOUS
  Filled 2020-02-09 (×3): qty 50

## 2020-02-09 MED ORDER — LORAZEPAM 1 MG PO TABS: 1.0000 mg | ORAL_TABLET | ORAL | Status: AC | PRN

## 2020-02-09 MED FILL — Sodium Bicarbonate IV Soln 8.4%: INTRAVENOUS | Qty: 50 | Status: AC

## 2020-02-09 MED FILL — Magnesium Sulfate Inj 50%: INTRAMUSCULAR | Qty: 10 | Status: AC

## 2020-02-09 MED FILL — Sodium Chloride IV Soln 0.9%: INTRAVENOUS | Qty: 2000 | Status: AC

## 2020-02-09 MED FILL — Potassium Chloride Inj 2 mEq/ML: INTRAVENOUS | Qty: 40 | Status: AC

## 2020-02-09 MED FILL — Albumin, Human Inj 5%: INTRAVENOUS | Qty: 250 | Status: AC

## 2020-02-09 MED FILL — Tranexamic Acid IV Soln 1000 MG/10ML (100 MG/ML): INTRAVENOUS | Qty: 3000 | Status: AC

## 2020-02-09 MED FILL — Mannitol IV Soln 20%: INTRAVENOUS | Qty: 500 | Status: AC

## 2020-02-09 MED FILL — Heparin Sodium (Porcine) Inj 1000 Unit/ML: INTRAMUSCULAR | Qty: 30 | Status: AC

## 2020-02-09 MED FILL — Electrolyte-R (PH 7.4) Solution: INTRAVENOUS | Qty: 5000 | Status: AC
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
## 2020-02-09 NOTE — Progress Notes (Signed)
Inpatient Rehab Admissions Coordinator Note:   Per PT recommendations, pt was screened for CIR candidacy by Estill Dooms, PT, DPT.  At this time pt appears to be limited in ability to participate by lethargy.  Will follow over the weekend for progress.  Please contact me with questions.   Estill Dooms, PT, DPT [phone] 02/09/20 1:30 PM

## 2020-02-09 NOTE — Progress Notes (Signed)
CT surgery p.m. Rounds  Patient had good day Contained sinus rhythm Cough-we will start flutter valve Physical therapy evaluated patient for CIR, currently in progress

## 2020-02-09 NOTE — Progress Notes (Signed)
NAME:  Tony Munoz, MRN:  [id number], DOB:  02/24/1956, LOS: 3 ADMISSION DATE:  02/06/2020, CONSULTATION DATE:  02/08/20 REFERRING MD:  Vickey Sages - CVTS, CHIEF COMPLAINT:  SOB, hypoxia   Brief History   64 yo M POD 3 CABG and L CEA, with progressive respiratory failure with hypoxia and hypercarbia   History of present illness   64 yo M with PMH CAD, HTN, who presented for CABG and  L CEA after workup for DOE (via stress test and LHC) demonstrated multivessel CAD. Pt had 80 % L ICA stenosis with contralateral occlusion. Pre-op eval reveals significant tobacco abuse, smoking 4 ppd for length of time, but recently with decrease to approx 1 pdd.   POD 2 (6/3) pt experienced progressive AMS and hypoxia. He was given morphine and ativan earlier in shift for tx of post-op pain and possible EtOH dts. Amidst pt hypoxic decompensation, pt received 40mg  Lasix empirically, with concern for possible flash pulmonary edema.  PCCM was asked to consult for progressive hypoxia. ABG at time of consult reveals hypoxic and hypercarbic respiratory failure.   CXR is pending at time of exam   Past Medical History  EtOH use  CAD  Anxiety Tobacco use disorder HTN  Significant Hospital Events   6/1 CABG and L CEA  6/2 receiving BZD for possible EtOH withdrawals. Was on precedex  6/3 worsening respiratory status with worsening hypoxia. Received 40 mg lasix. PCCM consulted   Consults:  Vascular surgery PCCM   Procedures:  6/1 CABG 6/1 L CEA   Significant Diagnostic Tests:  6/3 CXR >>>   Micro Data:    Antimicrobials:  Cefuroxime 6/1 (surg ppx)   Interim history/subjective:  Alert and able to follow simple commands on BIPAP this AM.  Objective   Blood pressure (!) 141/91, pulse 90, temperature 98 F (36.7 C), temperature source Oral, resp. rate 18, height 5\' 9"  (1.753 m), weight 56.9 kg, SpO2 100 %. CVP:  [16 mmHg] 16 mmHg  Vent Mode: BIPAP FiO2 (%):  [40 %] 40 % Set Rate:  [10 bmp] 10 bmp PEEP:   [6 cmH20] 6 cmH20 Pressure Support:  [6 cmH20] 6 cmH20   Intake/Output Summary (Last 24 hours) at 02/09/2020 0846 Last data filed at 02/09/2020 0800 Gross per 24 hour  Intake 973.55 ml  Output 2549 ml  Net -1575.45 ml   Filed Weights   02/07/20 0500 02/08/20 0500 02/09/20 0500  Weight: 59.7 kg 60 kg 56.9 kg    Examination: General: Chronically ill appearing elderly thin male on BIPAP in NAD HEENT: BIPAP mask in place, MM pink/moist, PERRL, sclera non-icteric, L CEA incision Neuro: Alert and able to follow simple commands, non-focal  CV: Atrial paced, s1s2 regular rate and rhythm, no murmur, rubs, or gallops,  PULM:  Course breath sounds bilaterally with faint expiratory wheeze, oxygen saturations 100% on BIPAP, bilateral chest tubes in place GI: soft, bowel sounds active in all 4 quadrants, non-tender, non-distended Extremities: warm/dry, no edema  Skin: no rashes or lesions  Resolved Hospital Problem list     Assessment & Plan:   Acute encephalopathy -In the setting of hypoxia, hypercarbia, possible medication related in setting of CIWA protocol meds and post-op pain meds -EtOH use disorder with possible EtOH withdrawals  P: Q4 CIWA scale  Thiamine, folate, and multivitamin supplementation PRN benzodiazepines, minimize as able   Mobilize as able   Acute hypoxic and hypercarbic respiratory failure Hx tobacco use Questionable aspiration  -Possible COPD exacerbation, possible component of  post-op atelectasis. Wheezing + crackles on exam, likely element of pulm edema -CXR with continued right lower opacity concerning for pneumonia, WBC improved with no fever P Continue IV solumedrol taper  Trail off BIPAP this AM  Continue BDs Continue to evaluate need of diuretics daily  Follow intermittent CXR  Encourage frequent pulm hygiene  Bedside swallow eval prior to oral intake   Low threshold to start empiric antibiotics  Follow WBC and fever curve   POD 2 CABG POD 2 left  CEA  P: Management per TCTS  Routine CT care  Monitor CT output  ASA and Plavix   HTN P: BP currently well controlled  Monitor in the ICU setting  PRN beta blocker   Afib P Continue amiodarone drip  Currently well controlled   Best practice:  Diet: NPO Pain/Anxiety/Delirium protocol (if indicated): PRN per primary  VAP protocol (if indicated): na DVT prophylaxis: per primary, post op  GI prophylaxis: protonix  Glucose control: SSI  Mobility: BR Code Status: Full  Family Communication: per primary  Disposition: ICU   Labs   CBC: Recent Labs  Lab 02/06/20 2227 02/06/20 2227 02/07/20 0316 02/07/20 0316 02/07/20 0349 02/07/20 1555 02/08/20 0308 02/08/20 1756 02/09/20 0326  WBC 5.4  --  6.7  --   --  10.8* 10.4  --  5.6  HGB 7.3*   < > 7.5*   < > 5.8* 11.0* 10.5* 11.2* 10.2*  HCT 22.3*   < > 22.9*   < > 17.0* 32.5* 31.2* 33.0* 30.9*  MCV 99.6  --  99.1  --   --  92.3 94.0  --  93.9  PLT 92*  --  99*  --   --  91* 84*  --  70*   < > = values in this interval not displayed.    Basic Metabolic Panel: Recent Labs  Lab 02/06/20 1956 02/06/20 2111 02/07/20 0316 02/07/20 0316 02/07/20 0349 02/07/20 1555 02/08/20 0308 02/08/20 1756 02/09/20 0326  NA 142   < > 140   < > 143 138 139 138 137  K 4.2   < > 3.9   < > 4.0 4.3 4.3 4.3 3.5  CL 111  --  111  --   --  107 104  --  99  CO2 23  --  22  --   --  25 24  --  28  GLUCOSE 118*  --  142*  --   --  110* 101*  --  150*  BUN 7*  --  8  --   --  8 13  --  14  CREATININE 0.78  --  0.85  --   --  0.83 0.95  --  0.96  CALCIUM 7.2*  --  7.6*  --   --  7.8* 7.4*  --  8.0*  MG 3.3*  --  2.4  --   --  2.4  --   --   --    < > = values in this interval not displayed.   GFR: Estimated Creatinine Clearance: 61.7 mL/min (by C-G formula based on SCr of 0.96 mg/dL). Recent Labs  Lab 02/07/20 0316 02/07/20 1555 02/08/20 0308 02/09/20 0326  WBC 6.7 10.8* 10.4 5.6    Liver Function Tests: Recent Labs  Lab  02/02/20 0956  AST 22  ALT 16  ALKPHOS 88  BILITOT 0.8  PROT 6.3*  ALBUMIN 3.7   No results for input(s): LIPASE, AMYLASE in the last 168  hours. No results for input(s): AMMONIA in the last 168 hours.  ABG    Component Value Date/Time   PHART 7.291 (L) 02/08/2020 1756   PCO2ART 56.5 (H) 02/08/2020 1756   PO2ART 83 02/08/2020 1756   HCO3 27.2 02/08/2020 1756   TCO2 29 02/08/2020 1756   ACIDBASEDEF 3.0 (H) 02/06/2020 2222   O2SAT 94.0 02/08/2020 1756     Coagulation Profile: Recent Labs  Lab 02/02/20 0956 02/06/20 1528  INR 1.0 1.8*    Cardiac Enzymes: No results for input(s): CKTOTAL, CKMB, CKMBINDEX, TROPONINI in the last 168 hours.  HbA1C: Hgb A1c MFr Bld  Date/Time Value Ref Range Status  02/02/2020 09:57 AM 5.2 4.8 - 5.6 % Final    Comment:    (NOTE) Pre diabetes:          5.7%-6.4% Diabetes:              >6.4% Glycemic control for   <7.0% adults with diabetes     CBG: Recent Labs  Lab 02/08/20 1533 02/08/20 2034 02/09/20 0036 02/09/20 0324 02/09/20 0759  GLUCAP 129* 158* 131* 119* 134*     Critical care time:    Performed by: Delfin Gant  Total critical care time: 38 minutes  Critical care time was exclusive of separately billable procedures and treating other patients.  Critical care was necessary to treat or prevent imminent or life-threatening deterioration.  Critical care was time spent personally by me on the following activities: development of treatment plan with patient and/or surrogate as well as nursing, discussions with consultants, evaluation of patient's response to treatment, examination of patient, obtaining history from patient or surrogate, ordering and performing treatments and interventions, ordering and review of laboratory studies, ordering and review of radiographic studies, pulse oximetry and re-evaluation of patient's condition.  Delfin Gant, NP-C [facility] Contact / Pager information  can be found on Amion  02/09/2020, 9:12 AM

## 2020-02-09 NOTE — Progress Notes (Signed)
Pharmacy Antibiotic Note  Tony Munoz is a 64 y.o. male admitted on 02/06/2020 with suspected aspiration PNA.  Pharmacy has been consulted for Unasyn dosing.  WBC WNL and afebrile, but new infiltrate on CXR.  Pharmacy asked by CCM to start Unasyn.  Plan: Unasyn 3g IV q 6 hrs F/u renal function, clinical course. F/u LOT.  Height: 5\' 9"  (175.3 cm) Weight: 56.9 kg (125 lb 7.1 oz) IBW/kg (Calculated) : 70.7  Temp (24hrs), Avg:98.1 F (36.7 C), Min:97.3 F (36.3 C), Max:98.8 F (37.1 C)  Recent Labs  Lab 02/06/20 1956 02/06/20 1956 02/06/20 2227 02/07/20 0316 02/07/20 1555 02/08/20 0308 02/09/20 0326  WBC 4.4   < > 5.4 6.7 10.8* 10.4 5.6  CREATININE 0.78  --   --  0.85 0.83 0.95 0.96   < > = values in this interval not displayed.    Estimated Creatinine Clearance: 61.7 mL/min (by C-G formula based on SCr of 0.96 mg/dL).    No Known Allergies  Antimicrobials this admission:  Unasyn 6/4 >  Cefuroxime 6/1>6/3 Vanc 6/1>6/1  Dose adjustments this admission:   Microbiology results:  none   Thank you for allowing pharmacy to be a part of this patient's care.  8/4, Reece Leader, BCCP Clinical Pharmacist  02/09/2020 12:27 PM   [facility] pharmacy phone numbers are listed on [email or web address]

## 2020-02-10 ENCOUNTER — Inpatient Hospital Stay (HOSPITAL_COMMUNITY): Payer: Medicare HMO

## 2020-02-10 ENCOUNTER — Inpatient Hospital Stay: Payer: Self-pay

## 2020-02-10 DIAGNOSIS — Z9911 Dependence on respirator [ventilator] status: Secondary | ICD-10-CM

## 2020-02-10 DIAGNOSIS — T17908S Unspecified foreign body in respiratory tract, part unspecified causing other injury, sequela: Secondary | ICD-10-CM

## 2020-02-10 LAB — BASIC METABOLIC PANEL
Anion gap: 9 (ref 5–15)
BUN: 15 mg/dL (ref 8–23)
CO2: 30 mmol/L (ref 22–32)
Calcium: 8.2 mg/dL — ABNORMAL LOW (ref 8.9–10.3)
Chloride: 101 mmol/L (ref 98–111)
Creatinine, Ser: 0.83 mg/dL (ref 0.61–1.24)
GFR calc Af Amer: >60 mL/min (ref >60)
GFR calc non Af Amer: >60 mL/min (ref >60)
Glucose, Bld: 110 mg/dL — ABNORMAL HIGH (ref 70–99)
Potassium: 3.6 mmol/L (ref 3.5–5.1)
Sodium: 140 mmol/L (ref 135–145)

## 2020-02-10 LAB — POCT I-STAT 7, (LYTES, BLD GAS, ICA,H+H)
Acid-Base Excess: 8 mmol/L — ABNORMAL HIGH (ref 0.0–2.0)
Bicarbonate: 33.3 mmol/L — ABNORMAL HIGH (ref 20.0–28.0)
Calcium, Ion: 1.19 mmol/L (ref 1.15–1.40)
HCT: 28 % — ABNORMAL LOW (ref 39.0–52.0)
Hemoglobin: 9.5 g/dL — ABNORMAL LOW (ref 13.0–17.0)
O2 Saturation: 100 %
Patient temperature: 96.5
Potassium: 4.5 mmol/L (ref 3.5–5.1)
Sodium: 140 mmol/L (ref 135–145)
TCO2: 35 mmol/L — ABNORMAL HIGH (ref 22–32)
pCO2 arterial: 48.4 mmHg — ABNORMAL HIGH (ref 32.0–48.0)
pH, Arterial: 7.441 (ref 7.350–7.450)
pO2, Arterial: 494 mmHg — ABNORMAL HIGH (ref 83.0–108.0)

## 2020-02-10 LAB — CBC
HCT: 28.8 % — ABNORMAL LOW (ref 39.0–52.0)
Hemoglobin: 9.6 g/dL — ABNORMAL LOW (ref 13.0–17.0)
MCH: 32 pg (ref 26.0–34.0)
MCHC: 33.3 g/dL (ref 30.0–36.0)
MCV: 96 fL (ref 80.0–100.0)
Platelets: 71 10*3/uL — ABNORMAL LOW (ref 150–400)
RBC: 3 MIL/uL — ABNORMAL LOW (ref 4.22–5.81)
RDW: 15.5 % (ref 11.5–15.5)
WBC: 5.6 10*3/uL (ref 4.0–10.5)
nRBC: 0 % (ref 0.0–0.2)

## 2020-02-10 LAB — GLUCOSE, CAPILLARY
Glucose-Capillary: 116 mg/dL — ABNORMAL HIGH (ref 70–99)
Glucose-Capillary: 123 mg/dL — ABNORMAL HIGH (ref 70–99)
Glucose-Capillary: 124 mg/dL — ABNORMAL HIGH (ref 70–99)
Glucose-Capillary: 133 mg/dL — ABNORMAL HIGH (ref 70–99)
Glucose-Capillary: 148 mg/dL — ABNORMAL HIGH (ref 70–99)
Glucose-Capillary: 94 mg/dL (ref 70–99)

## 2020-02-10 LAB — BRAIN NATRIURETIC PEPTIDE: B Natriuretic Peptide: 675.6 pg/mL — ABNORMAL HIGH (ref 0.0–100.0)

## 2020-02-10 MED ORDER — ALBUMIN HUMAN 25 % IV SOLN: 12.5000 g | Freq: Once | INTRAVENOUS | Status: AC

## 2020-02-10 MED ORDER — MIDAZOLAM HCL 2 MG/2ML IJ SOLN
2.0000 mg | INTRAMUSCULAR | Status: DC | PRN
  Administered 2020-02-11: 2 mg via INTRAVENOUS
  Filled 2020-02-10: qty 2

## 2020-02-10 MED ORDER — SUCCINYLCHOLINE CHLORIDE 20 MG/ML IJ SOLN
50.0000 mg | Freq: Once | INTRAMUSCULAR | Status: AC
  Administered 2020-02-10: 50 mg via INTRAVENOUS

## 2020-02-10 MED ORDER — CHLORHEXIDINE GLUCONATE 0.12% ORAL RINSE (MEDLINE KIT)
15.0000 mL | Freq: Two times a day (BID) | OROMUCOSAL | Status: DC
  Administered 2020-02-10 – 2020-02-15 (×8): 15 mL via OROMUCOSAL

## 2020-02-10 MED ORDER — FENTANYL CITRATE (PF) 100 MCG/2ML IJ SOLN
100.0000 ug | Freq: Once | INTRAMUSCULAR | Status: AC
  Administered 2020-02-10: 100 ug via INTRAVENOUS
  Filled 2020-02-10: qty 2

## 2020-02-10 MED ORDER — OXYCODONE HCL 5 MG PO TABS
5.0000 mg | ORAL_TABLET | ORAL | Status: DC | PRN
  Administered 2020-02-15 – 2020-02-17 (×3): 5 mg via ORAL
  Filled 2020-02-10 (×4): qty 1

## 2020-02-10 MED ORDER — MIDAZOLAM HCL 2 MG/2ML IJ SOLN
INTRAMUSCULAR | Status: AC
  Administered 2020-02-10: 2 mg
  Filled 2020-02-10: qty 2

## 2020-02-10 MED ORDER — PHENYLEPHRINE HCL-NACL 10-0.9 MG/250ML-% IV SOLN: 0.0000 ug/min | INTRAVENOUS | Status: DC

## 2020-02-10 MED ORDER — PHENYLEPHRINE HCL-NACL 10-0.9 MG/250ML-% IV SOLN: 25.0000 ug/min | INTRAVENOUS | Status: DC

## 2020-02-10 MED ORDER — FENTANYL 2500MCG IN NS 250ML (10MCG/ML) PREMIX INFUSION
0.0000 ug/h | INTRAVENOUS | Status: DC
  Administered 2020-02-10 – 2020-02-11 (×2): 300 ug/h via INTRAVENOUS
  Administered 2020-02-11: 225 ug/h via INTRAVENOUS
  Filled 2020-02-10 (×2): qty 250

## 2020-02-10 MED ORDER — FENTANYL 2500MCG IN NS 250ML (10MCG/ML) PREMIX INFUSION
0.0000 ug/h | INTRAVENOUS | Status: DC
  Administered 2020-02-10: 200 ug/h via INTRAVENOUS
  Filled 2020-02-10: qty 250

## 2020-02-10 MED ORDER — ALBUMIN HUMAN 25 % IV SOLN
12.5000 g | Freq: Four times a day (QID) | INTRAVENOUS | Status: DC
  Administered 2020-02-10: 12.5 g via INTRAVENOUS
  Filled 2020-02-10: qty 50

## 2020-02-10 MED ORDER — FENTANYL BOLUS VIA INFUSION
50.0000 ug | INTRAVENOUS | Status: DC | PRN
  Filled 2020-02-10: qty 50

## 2020-02-10 MED ORDER — AMIODARONE LOAD VIA INFUSION
150.0000 mg | Freq: Once | INTRAVENOUS | Status: AC
  Administered 2020-02-10: 150 mg via INTRAVENOUS
  Filled 2020-02-10: qty 83.34

## 2020-02-10 MED ORDER — FENTANYL CITRATE (PF) 100 MCG/2ML IJ SOLN
50.0000 ug | INTRAMUSCULAR | Status: DC | PRN
  Administered 2020-02-10: 50 ug via INTRAVENOUS

## 2020-02-10 MED ORDER — MOMETASONE FURO-FORMOTEROL FUM 200-5 MCG/ACT IN AERO
2.0000 | INHALATION_SPRAY | Freq: Two times a day (BID) | RESPIRATORY_TRACT | Status: DC
  Filled 2020-02-10: qty 8.8

## 2020-02-10 MED ORDER — FENTANYL CITRATE (PF) 100 MCG/2ML IJ SOLN
INTRAMUSCULAR | Status: AC
  Filled 2020-02-10: qty 2

## 2020-02-10 MED ORDER — LORAZEPAM 2 MG/ML IJ SOLN
1.0000 mg | INTRAMUSCULAR | Status: DC | PRN
  Administered 2020-02-10: 2 mg via INTRAVENOUS
  Filled 2020-02-10: qty 1

## 2020-02-10 MED ORDER — PHENYLEPHRINE HCL-NACL 20-0.9 MG/250ML-% IV SOLN: 0.0000 ug/min | INTRAVENOUS | Status: DC

## 2020-02-10 MED ORDER — POTASSIUM CHLORIDE CRYS ER 20 MEQ PO TBCR
20.0000 meq | EXTENDED_RELEASE_TABLET | ORAL | Status: DC
  Administered 2020-02-10: 20 meq via ORAL
  Filled 2020-02-10: qty 1

## 2020-02-10 MED ORDER — LORAZEPAM 1 MG PO TABS: 1.0000 mg | ORAL_TABLET | ORAL | Status: DC | PRN

## 2020-02-10 MED ORDER — PHENYLEPHRINE HCL-NACL 20-0.9 MG/250ML-% IV SOLN
0.0000 ug/min | INTRAVENOUS | Status: DC
  Administered 2020-02-10: 10 ug/min via INTRAVENOUS
  Administered 2020-02-15: 20 ug/min via INTRAVENOUS
  Administered 2020-02-15: 35 ug/min via INTRAVENOUS
  Administered 2020-02-16: 50 ug/min via INTRAVENOUS
  Administered 2020-02-16: 45 ug/min via INTRAVENOUS
  Administered 2020-02-17: 40 ug/min via INTRAVENOUS
  Filled 2020-02-10 (×7): qty 250

## 2020-02-10 MED ORDER — MIDAZOLAM BOLUS VIA INFUSION
1.0000 mg | Freq: Once | INTRAVENOUS | Status: DC
  Filled 2020-02-10: qty 1

## 2020-02-10 MED ORDER — POLYETHYLENE GLYCOL 3350 17 G PO PACK: 17.0000 g | PACK | Freq: Every day | ORAL | Status: DC

## 2020-02-10 MED ORDER — DEXMEDETOMIDINE HCL IN NACL 400 MCG/100ML IV SOLN
0.4000 ug/kg/h | INTRAVENOUS | Status: DC
  Administered 2020-02-10: 1.2 ug/kg/h via INTRAVENOUS
  Administered 2020-02-10: 0.5 ug/kg/h via INTRAVENOUS
  Administered 2020-02-11: 0.9 ug/kg/h via INTRAVENOUS
  Administered 2020-02-11: 1.2 ug/kg/h via INTRAVENOUS
  Filled 2020-02-10 (×5): qty 100

## 2020-02-10 MED ORDER — SODIUM CHLORIDE 0.9 % IV SOLN: 250.0000 mL | INTRAVENOUS | Status: DC

## 2020-02-10 MED ORDER — FENTANYL CITRATE (PF) 100 MCG/2ML IJ SOLN
50.0000 ug | Freq: Once | INTRAMUSCULAR | Status: AC
  Administered 2020-02-10: 50 ug via INTRAVENOUS

## 2020-02-10 MED ORDER — MIDAZOLAM HCL 2 MG/2ML IJ SOLN
2.0000 mg | Freq: Once | INTRAMUSCULAR | Status: AC
  Administered 2020-02-10: 2 mg via INTRAVENOUS
  Filled 2020-02-10: qty 2

## 2020-02-10 MED ORDER — FENTANYL 2500MCG IN NS 250ML (10MCG/ML) PREMIX INFUSION: 50.0000 ug/h | INTRAVENOUS | Status: DC

## 2020-02-10 MED ORDER — POTASSIUM CHLORIDE 20 MEQ/15ML (10%) PO SOLN
ORAL | Status: AC
  Administered 2020-02-10: 20 meq
  Filled 2020-02-10: qty 15

## 2020-02-10 MED ORDER — FENTANYL CITRATE (PF) 100 MCG/2ML IJ SOLN
INTRAMUSCULAR | Status: AC
  Administered 2020-02-10: 100 ug
  Filled 2020-02-10: qty 2

## 2020-02-10 MED ORDER — ORAL CARE MOUTH RINSE
15.0000 mL | OROMUCOSAL | Status: DC
  Administered 2020-02-10 – 2020-02-11 (×4): 15 mL via OROMUCOSAL

## 2020-02-10 MED ORDER — DEXMEDETOMIDINE HCL IN NACL 400 MCG/100ML IV SOLN: 0.4000 ug/kg/h | INTRAVENOUS | Status: DC

## 2020-02-10 MED ORDER — GUAIFENESIN ER 600 MG PO TB12
600.0000 mg | ORAL_TABLET | Freq: Two times a day (BID) | ORAL | Status: DC
  Administered 2020-02-10 (×2): 600 mg via ORAL
  Filled 2020-02-10 (×2): qty 1

## 2020-02-10 NOTE — Procedures (Signed)
Intubation Procedure Note CAYSEN WHANG [id number] 02/13/1956  Procedure: Intubation Indications: Respiratory insufficiency  Procedure Details Consent: Unable to obtain consent because of emergent medical necessity. Time Out: Verified patient identification, verified procedure, site/side was marked, verified correct patient position, special equipment/implants available, medications/allergies/relevent history reviewed, required imaging and test results available.  Performed  Maximum sterile technique was used including cap, gloves, gown and hand hygiene. Glidescope 4 was used to directly visualize the vocal cords.   Evaluation Hemodynamic Status: BP stable throughout; O2 sats: stable throughout Patient's Current Condition: stable Complications: No apparent complications Patient did tolerate procedure well. Chest X-ray ordered to verify placement.  CXR: pending.   Delfin Gant, NP-C [facility] Contact / Pager information can be found on Amion  02/10/2020, 2:57 PM

## 2020-02-10 NOTE — Progress Notes (Signed)
Patient became agitated during xray and began to pull lines and ett. Soft restraints order obtained from Dr. Donata Clay.

## 2020-02-10 NOTE — Progress Notes (Signed)
NAME:  Tony Munoz, MRN:  [id number], DOB:  05/12/1956, LOS: 4 ADMISSION DATE:  02/06/2020, CONSULTATION DATE:  02/08/20 REFERRING MD:  Orvan Seen - CVTS, CHIEF COMPLAINT:  SOB, hypoxia   Brief History   64 yo M POD 3 CABG and L CEA, with progressive respiratory failure with hypoxia and hypercarbia   History of present illness   64 yo M with PMH CAD, HTN, who presented for CABG and  L CEA after workup for DOE (via stress test and LHC) demonstrated multivessel CAD. Pt had 80 % L ICA stenosis with contralateral occlusion. Pre-op eval reveals significant tobacco abuse, smoking 4 ppd for length of time, but recently with decrease to approx 1 pdd.   POD 2 (6/3) pt experienced progressive AMS and hypoxia. He was given morphine and ativan earlier in shift for tx of post-op pain and possible EtOH dts. Amidst pt hypoxic decompensation, pt received 62m Lasix empirically, with concern for possible flash pulmonary edema.  PCCM was asked to consult for progressive hypoxia. ABG at time of consult reveals hypoxic and hypercarbic respiratory failure.   CXR is pending at time of exam   Past Medical History  EtOH use  CAD  Anxiety Tobacco use disorder HTN  Significant Hospital Events   6/1 CABG and L CEA  6/2 receiving BZD for possible EtOH withdrawals. Was on precedex  6/3 worsening respiratory status with worsening hypoxia. Received 40 mg lasix. PCCM consulted   Consults:  Vascular surgery PCCM   Procedures:  6/1 CABG 6/1 L CEA   Significant Diagnostic Tests:  6/3 CXR >>>   Micro Data:    Antimicrobials:  Cefuroxime 6/1 (surg ppx)   Interim history/subjective:  Alert and interactive this morning. Weak cough, lots of secretions. Had an episode of desaturation possibly due to a mucus plug this morning. His nurse is concerned for aspiration. This afternoon became obtunded with respiratory distress, requiring reintubation.  Objective   Blood pressure 130/88, pulse 98, temperature 97.7 F  (36.5 C), temperature source Oral, resp. rate 15, height _0  (1.753 m), weight 58.6 kg, SpO2 96 %. CVP:  [5 mmHg-8 mmHg] 8 mmHg  Vent Mode: PRVC FiO2 (%):  [40 %-100 %] 100 % Set Rate:  [15 bmp] 15 bmp Vt Set:  [570 mL] 570 mL PEEP:  [8 cmH20] 8 cmH20 Plateau Pressure:  [18 cmH20] 18 cmH20   Intake/Output Summary (Last 24 hours) at 02/10/2020 1457 Last data filed at 02/10/2020 1200 Gross per 24 hour  Intake 1055.37 ml  Output 1114 ml  Net -58.63 ml   Filed Weights   02/08/20 0500 02/09/20 0500 02/10/20 0500  Weight: 60 kg 56.9 kg 58.6 kg    Examination: General: Cachectic, frail, ill-appearing man sitting up in bed no acute distress HEENT: [place]/AT, temporal wasting.  Oral mucosa moist, edentulous Neuro: Alert, answering questions appropriately, globally weak but moving all extremities CV: Regular rate and rhythm, paced. PULM: Wet sounding cough, rhonchi bilaterally.  Tachypnea but no accessory muscle use earlier today. This afternoon using accessory muscles, coarse rhonchi diffusely GI: soft, bowel sounds active in all 4 quadrants, non-tender, non-distended Extremities: no cyanosis or edema Skin: healing sternotomy and L carotid scars  Resolved Hospital Problem list     Assessment & Plan:   Acute encephalopathy -In the setting of hypoxia, hypercarbia, possible medication related in setting of CIWA protocol meds and post-op pain meds -EtOH use disorder with possible EtOH withdrawals  P: -Continue thiamine, folate, supplemental vitamins -Can resume CIWA after  extubation -Frequent reorientation  Acute hypoxic and hypercarbic respiratory failure Aspiration-right lower lobe foreign body found during bronchoscopy Hx tobacco use Questionable aspiration  HAP -Possible COPD exacerbation, possible component of post-op atelectasis. Wheezing + crackles on exam, likely element of pulm edema -CXR with continued right lower opacity concerning for pneumonia, WBC improved with no  fever P -CPT -Continue low tidal volume ventilation, 4-8 cc/kg ideal body weight with goal plateau less than 30 and driving pressure less than 15.  Titrate PEEP and FiO2. -Bronchoscopy at bedside today.  Will have to perform foreign body extraction when equipment is available. -Continue bronchodilators -Continue Unasyn -BAL culture sent -Will require SLP evaluation after extubation  CABG 6/1 L CEA 6/1 P: -Continue management per to CTS -Updated Dr. Darcey Nora on change in respiratory status -Continue aspirin and Plavix  H/o HTN P: -Holding antihypertensives while requiring sedation on mechanical ventilation  Afib P: -Continue amiodarone -Continue telemetry monitoring  Cachexia, frailty P: -will resume TF   Best practice:  Diet: NPO  Pain/Anxiety/Delirium protocol (if indicated): PRN per primary  VAP protocol (if indicated): na DVT prophylaxis: per primary, post op  GI prophylaxis: protonix  Glucose control: SSI  Mobility: BR Code Status: Full  Family Communication: updated friend Renee via phone 6/5 Disposition: ICU   Labs   CBC: Recent Labs  Lab 02/07/20 0316 02/07/20 0349 02/07/20 1555 02/08/20 0308 02/08/20 1756 02/09/20 0326 02/10/20 0355  WBC 6.7  --  10.8* 10.4  --  5.6 5.6  HGB 7.5*   < > 11.0* 10.5* 11.2* 10.2* 9.6*  HCT 22.9*   < > 32.5* 31.2* 33.0* 30.9* 28.8*  MCV 99.1  --  92.3 94.0  --  93.9 96.0  PLT 99*  --  91* 84*  --  70* 71*   < > = values in this interval not displayed.    Basic Metabolic Panel: Recent Labs  Lab 02/06/20 1956 02/06/20 2111 02/07/20 0316 02/07/20 0349 02/07/20 1555 02/08/20 0308 02/08/20 1756 02/09/20 0326 02/10/20 0355  NA 142   < > 140   < > 138 139 138 137 140  K 4.2   < > 3.9   < > 4.3 4.3 4.3 3.5 3.6  CL 111   < > 111  --  107 104  --  99 101  CO2 23   < > 22  --  25 24  --  28 30  GLUCOSE 118*   < > 142*  --  110* 101*  --  150* 110*  BUN 7*   < > 8  --  8 13  --  14 15  CREATININE 0.78   < > 0.85   --  0.83 0.95  --  0.96 0.83  CALCIUM 7.2*   < > 7.6*  --  7.8* 7.4*  --  8.0* 8.2*  MG 3.3*  --  2.4  --  2.4  --   --   --   --    < > = values in this interval not displayed.   GFR: Estimated Creatinine Clearance: 73.5 mL/min (by C-G formula based on SCr of 0.83 mg/dL). Recent Labs  Lab 02/07/20 1555 02/08/20 0308 02/09/20 0326 02/10/20 0355  WBC 10.8* 10.4 5.6 5.6    Liver Function Tests: No results for input(s): AST, ALT, ALKPHOS, BILITOT, PROT, ALBUMIN in the last 168 hours. No results for input(s): LIPASE, AMYLASE in the last 168 hours. No results for input(s): AMMONIA in the last 168 hours.  ABG    Component Value Date/Time   PHART 7.291 (L) 02/08/2020 1756   PCO2ART 56.5 (H) 02/08/2020 1756   PO2ART 83 02/08/2020 1756   HCO3 27.2 02/08/2020 1756   TCO2 29 02/08/2020 1756   ACIDBASEDEF 3.0 (H) 02/06/2020 2222   O2SAT 94.0 02/08/2020 1756     Coagulation Profile: Recent Labs  Lab 02/06/20 1528  INR 1.8*    Cardiac Enzymes: No results for input(s): CKTOTAL, CKMB, CKMBINDEX, TROPONINI in the last 168 hours.  HbA1C: Hgb A1c MFr Bld  Date/Time Value Ref Range Status  02/02/2020 09:57 AM 5.2 4.8 - 5.6 % Final    Comment:    (NOTE) Pre diabetes:          5.7%-6.4% Diabetes:              >6.4% Glycemic control for   <7.0% adults with diabetes     CBG: Recent Labs  Lab 02/09/20 1953 02/10/20 0037 02/10/20 0457 02/10/20 0643 02/10/20 1110  GLUCAP 123* 123* 116* 124* 148*      This patient is critically ill with multiple organ system failure which requires frequent high complexity decision making, assessment, support, evaluation, and titration of therapies. This was completed through the application of advanced monitoring technologies and extensive interpretation of multiple databases. During this encounter critical care time was devoted to patient care services described in this note for 45 minutes.  Julian Hy, DO 02/10/20 3:14 PM [facility]

## 2020-02-10 NOTE — Progress Notes (Signed)
eLink Physician-Brief Progress Note Patient Name: Tony Munoz DOB: Jan 16, 1956 MRN: [id number]   Date of Service  02/10/2020  HPI/Events of Note  Notified of decreasing urine output. Ongoing fluids total of 30 cc/hr. Given albumin and lasix on hold. Now placed on Bipap  eICU Interventions  Ordered CXR and BNP to help guide whether patient will need to be diuresed     Intervention Category Intermediate Interventions: Oliguria - evaluation and management  Darl Pikes 02/10/2020, 3:45 AM

## 2020-02-10 NOTE — Progress Notes (Signed)
Spoke with Canary Brim RN re PICC order.  Pt currently hypotensive, on multiple critical gtts.  Will reassess status for PICC placement in the am.  RN also to determine POA/ person to give consent for procedure due to pt sedated.

## 2020-02-10 NOTE — Progress Notes (Signed)
Called to check on patient at 1400 patient was unresponsive, shallow breathing with audible rales on 100% NRB.  Prepared patient for intubation which was successful with 8.0 ET tube with glidascope assistance on first try with 4 mac handle.  ET tube secured at 24 cm with tube holder and Bilateral BS were equal and coarse rales. ETCO2 also determined correct placement as well as direct visualization.  Initial settings on vent were 570 (8 cc/kg), rate of 15, 100% FIO2 and 8 PEEP. Patient was bronched at bedside with saline lavage of 50 cc with return of that with thick tan chunks and blood clots.  Sats were 98-100% entire time and hemodynamically stable.  Washing collected for lab analysis.

## 2020-02-10 NOTE — Evaluation (Addendum)
Clinical/Bedside Swallow Evaluation Patient Details  Name: Tony Munoz MRN: [id number] Date of Birth: [birth year]  Today's Date: 02/10/2020 Time: SLP Start Time (ACUTE ONLY): 1302 SLP Stop Time (ACUTE ONLY): 1320 SLP Time Calculation (min) (ACUTE ONLY): 18 min  Past Medical History:  Past Medical History:  Diagnosis Date  . Abnormal EKG   . Anxiety   . Bilateral carotid bruits 12/20/2019  . Carotid artery occlusion   . Cigarette smoker 12/20/2019  . Coronary artery disease   . DOE (dyspnea on exertion) 12/20/2019  . Dyspnea   . Essential hypertension 12/20/2019  . Prominent abdominal aortic pulsation 12/20/2019  . Radiculopathy 02/15/2019   Past Surgical History:  Past Surgical History:  Procedure Laterality Date  . ANTERIOR CERVICAL DECOMPRESSION/DISCECTOMY FUSION 4 LEVELS Bilateral 02/15/2019   Procedure: ANTERIOR CERVICAL DECOMPRESSION FUSION, CERVICAL FOUR-FIVE, CERVICAL FIVE-SIX, CERVICAL SIX-SEVEN WITH INSTRUMENTATION AND ALLOGRAFT;  Surgeon: Estill Bamberg, MD;  Location: [facility] OR;  Service: Orthopedics;  Laterality: Bilateral;  . BACK SURGERY    . CORONARY ARTERY BYPASS GRAFT N/A 02/06/2020   Procedure: CORONARY ARTERY BYPASS GRAFTING (CABG),  x Four with Bilateral IMAs, and right leg greater saphenous vein;  Surgeon: [name], MD;  Location: [facility] OR;  Service: Open Heart Surgery;  Laterality: N/A;  . ENDARTERECTOMY Left 02/06/2020   Procedure: CAROTID ENDARTERECTOMY LEFT with  Patch Angioplasty Using Hemashield Platinum Finesse patch;  Surgeon: Sherren Kerns, MD;  Location: [facility] OR;  Service: Vascular;  Laterality: Left;  . EYE SURGERY Bilateral    Cataracts  . HERNIA REPAIR     double hernia - 91yrs ago  . LEFT HEART CATH AND CORONARY ANGIOGRAPHY N/A 01/19/2020   Procedure: LEFT HEART CATH AND CORONARY ANGIOGRAPHY;  Surgeon: Lyn Records, MD;  Location: [facility] INVASIVE CV LAB;  Service: Cardiovascular;  Laterality: N/A;  . TEE WITHOUT CARDIOVERSION N/A 02/06/2020   Procedure:  TRANSESOPHAGEAL ECHOCARDIOGRAM (TEE);  Surgeon: [name], MD;  Location: [facility] OR;  Service: Open Heart Surgery;  Laterality: N/A;  . TONSILLECTOMY     HPI:  64 y.o male presented for surgery 02/06/20 for CABGx4 , bilateral IMA, carotid endarterectomy. Complications from ETOH, worsening respiratory function requiring BiPAP. Extubated 06/02, off BiPAP 06/04.  PMH radiculopathy, HTN, dyspnea, smoker, carotid artery occulusion, anxiety.   Assessment / Plan / Recommendation Clinical Impression  Pt was seen for a bedside swallow evaluation in the setting of reported difficulty with medication consumption.  Pt and RN reported that he had difficulty swallowing pills even when they were cut/crushed with applesauce.  Pt denied coughing/choking with liquids and solids otherwise.  Oral mechanism exam was remarkable for generalized oral weakness and edentulism (dentures were present but not in place).   Pt was encountered awake/alert with non-rebreather in place.  Generally prefer for pts to be able to tolerate being off of the NRB for 30 minutes prior to a swallow evaluation; however, since pt is currently on a diet, pt was seen trials of ice chips, thin liquid, and puree per RN request.  He exhibited suspected prolonged oral transit and delayed swallow initiation with all trials.  A delayed cough was noted with ice chip and puree trials and an immediate cough was observed with thin liquid trials.  Cough was congested and weak, and pt mobilized sputum to his oral cavity to be suctioned x1.  Pt refused all additional trials at this time.  Due to overt s/sx of aspiration with PO trials and decreased respiratory status, recommend that pt be  strict NPO at this time with frequent oral care and medications given via alternative means. Spoke with the pt regarding recommendations and he verbalized understanding.  SLP will re-evaluate in 24-48 hours to assess readiness for clinical diet initiation vs need for instrumental  swallow study.   SLP Visit Diagnosis: Dysphagia, unspecified (R13.10)    Aspiration Risk  Moderate aspiration risk    Diet Recommendation NPO   Medication Administration: Via alternative means    Other  Recommendations Oral Care Recommendations: Oral care QID;Oral care prior to ice chip/H20;Staff/trained caregiver to provide oral care Other Recommendations: Remove water pitcher   Follow up Recommendations Skilled Nursing facility      Frequency and Duration min 2x/week  2 weeks       Prognosis Prognosis for Safe Diet Advancement: Good      Swallow Study   General HPI: 64 y.o male presented for surgery 02/06/20 for CABGx4 , bilateral IMA, carotid endartectomy. Complications from ETOH, worsening respiratory function requiring bipap. Extubated 06/02, off bipap 06/04.  PMH radiculopathy, HTN, dyspnea, smoker, carotid artery occulsion, anxiety. Type of Study: Bedside Swallow Evaluation Previous Swallow Assessment: None Diet Prior to this Study: Regular;Thin liquids Temperature Spikes Noted: No Respiratory Status: Non-rebreather History of Recent Intubation: Yes Length of Intubations (days): (less than one for procedure) Date extubated: 02/06/20 Behavior/Cognition: Alert Oral Cavity Assessment: Within Functional Limits Oral Care Completed by SLP: No Oral Cavity - Dentition: Edentulous(dentures at bedside ) Self-Feeding Abilities: Needs assist Patient Positioning: Upright in bed Baseline Vocal Quality: Low vocal intensity Volitional Cough: Weak;Congested Volitional Swallow: Able to elicit    Oral/Motor/Sensory Function Overall Oral Motor/Sensory Function: Generalized oral weakness Facial ROM: Reduced right;Reduced left Facial Symmetry: Within Functional Limits Lingual ROM: Reduced right;Reduced left Lingual Symmetry: Within Functional Limits Lingual Strength: Reduced   Ice Chips Ice chips: Impaired Presentation: Spoon Pharyngeal Phase Impairments: Cough - Delayed    Thin Liquid Thin Liquid: Impaired Presentation: Spoon Pharyngeal  Phase Impairments: Suspected delayed Swallow;Cough - Immediate    Nectar Thick Nectar Thick Liquid: Not tested   Honey Thick Honey Thick Liquid: Not tested   Puree Puree: Impaired Presentation: Spoon Pharyngeal Phase Impairments: Cough - Delayed   Solid     Solid: Not tested     Colin Mulders M.S., CCC-SLP Acute Rehabilitation Services Office: [phone]  Elvia Collum Merrit Waugh 02/10/2020,1:53 PM

## 2020-02-10 NOTE — Progress Notes (Signed)
4 Days Post-Op Procedure(s) (LRB): CORONARY ARTERY BYPASS GRAFTING (CABG),  x Four with Bilateral IMAs, and right leg greater saphenous vein (N/A) TRANSESOPHAGEAL ECHOCARDIOGRAM (TEE) (N/A) INDOCYANINE GREEN FLUORESCENCE IMAGING (ICG) (N/A) CAROTID ENDARTERECTOMY LEFT with  Patch Angioplasty Using Hemashield Platinum Finesse patch (Left) Subjective: Controlled AFIB on iv amio COPD with prob postop  RML pneumonia on iv antibiotics On BiPap at night Air leak thru chest tubes- will leave Objective: Vital signs in last 24 hours: Temp:  [97.4 F (36.3 C)-97.8 F (36.6 C)] 97.7 F (36.5 C) (06/05 0645) Pulse Rate:  [88-118] 91 (06/05 0959) Cardiac Rhythm: Atrial paced (06/05 0800) Resp:  [10-36] 17 (06/05 0900) BP: (107-175)/(71-119) 129/80 (06/05 0959) SpO2:  [91 %-100 %] 97 % (06/05 0900) FiO2 (%):  [40 %] 40 % (06/05 0400) Weight:  [58.6 kg] 58.6 kg (06/05 0500)  Hemodynamic parameters for last 24 hours: CVP:  [5 mmHg-8 mmHg] 8 mmHg  Intake/Output from previous day: 06/04 0701 - 06/05 0700 In: 1141.2 [I.V.:607.9; IV Piggyback:533.3] Out: 1229 [Urine:689; Chest Tube:540] Intake/Output this shift: Total I/O In: 94.1 [I.V.:69.6; IV Piggyback:24.5] Out: 63 [Urine:43; Chest Tube:20]       Exam    General- alert and comfortable    Neck- no JVD, no cervical adenopathy palpable, no carotid bruit   Lungs- coarse with whexezeing   Cor- irregular rate and rhythm, no murmur , gallop   Abdomen- soft, non-tender   Extremities - warm, non-tender, minimal edema   Neuro- oriented, appropriate, no focal weakness   Lab Results: Recent Labs    02/09/20 0326 02/10/20 0355  WBC 5.6 5.6  HGB 10.2* 9.6*  HCT 30.9* 28.8*  PLT 70* 71*   BMET:  Recent Labs    02/09/20 0326 02/10/20 0355  NA 137 140  K 3.5 3.6  CL 99 101  CO2 28 30  GLUCOSE 150* 110*  BUN 14 15  CREATININE 0.96 0.83  CALCIUM 8.0* 8.2*    PT/INR: No results for input(s): LABPROT, INR in the last 72  hours. ABG    Component Value Date/Time   PHART 7.291 (L) 02/08/2020 1756   HCO3 27.2 02/08/2020 1756   TCO2 29 02/08/2020 1756   ACIDBASEDEF 3.0 (H) 02/06/2020 2222   O2SAT 94.0 02/08/2020 1756   CBG (last 3)  Recent Labs    02/10/20 0037 02/10/20 0457 02/10/20 0643  GLUCAP 123* 116* 124*    Assessment/Plan: S/P Procedure(s) (LRB): CORONARY ARTERY BYPASS GRAFTING (CABG),  x Four with Bilateral IMAs, and right leg greater saphenous vein (N/A) TRANSESOPHAGEAL ECHOCARDIOGRAM (TEE) (N/A) INDOCYANINE GREEN FLUORESCENCE IMAGING (ICG) (N/A) CAROTID ENDARTERECTOMY LEFT with  Patch Angioplasty Using Hemashield Platinum Finesse patch (Left) Mobilize rebolus iv amio OOB to chair startt nebs  LOS: 4 days    Tony Munoz 02/10/2020

## 2020-02-10 NOTE — Progress Notes (Signed)
PCCM Progress Note   Called to room by bedside nurse due to acute change in condition. On assessment patient was seen in significant acute respiratory distress with significant bilateral rhonchi with decreased breath sounds on the right. He was seen minimally responsive with inability to protect his airway. Decision was made to emergently intubate. Patient was successfully intubated and bronched at bedside. Please see procedure notes for further details.  Delfin Gant, NP-C [facility] Contact / Pager information can be found on Amion  02/10/2020, 3:05 PM

## 2020-02-10 NOTE — Progress Notes (Signed)
At approximately 1400 patient became agitated, obtunded and hypoxic. RSI performed by Raymon Mutton. ETT and OG placed.

## 2020-02-10 NOTE — Procedures (Signed)
Bronchoscopy Procedure Note Tony Munoz [id number] Sep 15, 1955  Procedure: Bronchoscopy Indications: Diagnostic evaluation of the airways, Obtain specimens for culture and/or other diagnostic studies and Foriegn body removal   Procedure Details Consent: Unable to obtain consent because of altered level of consciousness. Time Out: Verified patient identification, verified procedure, site/side was marked, verified correct patient position, special equipment/implants available, medications/allergies/relevent history reviewed, required imaging and test results available.  Performed  In preparation for procedure, patient was given 100% FiO2 and bronchoscope lubricated. Sedation: Benzodiazepines and Precedex drip  Airway entered and the following bronchi were examined: RUL, RML, RLL, LUL and LLL.    Procedures performed: Foreign body seen in the right bronchus intermedius a Fogarty balloon and wires basket was used to assist in removal of foreign body. Copious thick tan secretions removed with sample obtained.  Bronchoscope removed.    Evaluation Hemodynamic Status: Transient hypotension treated with pressors and treated with fluid; O2 sats: stable throughout Patient's Current Condition: stable Specimens:  Sent purulent fluid Complications: No apparent complications Patient did tolerate procedure well.  Delfin Gant, NP-C [facility] Contact / Pager information can be found on Amion  02/10/2020, 3:53 PM

## 2020-02-10 NOTE — Progress Notes (Signed)
CT surgery p.m. Rounds  Patient developed respiratory distress due to airway secretions and was intubated.  Bronchoscopy demonstrated a aspirated tablet in his right bronchus intermedius which was removed by Dr. Chestine Spore patient now stable and comfortable supported on ventilator.  Chest x-ray shows no new infiltrates with endotracheal tube in place Secretions to be sent for culture.

## 2020-02-10 NOTE — Progress Notes (Signed)
Patient desatted to 33 and endorsed shortness of breath. Non-rebreather placed and O2 sat returned to 97.

## 2020-02-10 NOTE — Progress Notes (Signed)
OT Cancellation Note  Patient Details Name: Tony Munoz MRN: [id number] DOB: 1956/03/04   Cancelled Treatment:    Reason Eval/Treat Not Completed: Patient at procedure or test/ unavailable(bronchoscopy. WIll return as schedule allows. Thank you.)  [name] [name] [name] MSOT, OTR/L Acute Rehab Pager: [phone] Office: [phone] 02/10/2020, 3:50 PM

## 2020-02-11 ENCOUNTER — Inpatient Hospital Stay (HOSPITAL_COMMUNITY): Payer: Medicare HMO

## 2020-02-11 ENCOUNTER — Inpatient Hospital Stay: Payer: Self-pay

## 2020-02-11 DIAGNOSIS — J690 Pneumonitis due to inhalation of food and vomit: Secondary | ICD-10-CM

## 2020-02-11 DIAGNOSIS — E43 Unspecified severe protein-calorie malnutrition: Secondary | ICD-10-CM

## 2020-02-11 LAB — CBC
HCT: 30 % — ABNORMAL LOW (ref 39.0–52.0)
Hemoglobin: 9.6 g/dL — ABNORMAL LOW (ref 13.0–17.0)
MCH: 31.2 pg (ref 26.0–34.0)
MCHC: 32 g/dL (ref 30.0–36.0)
MCV: 97.4 fL (ref 80.0–100.0)
Platelets: 83 10*3/uL — ABNORMAL LOW (ref 150–400)
RBC: 3.08 MIL/uL — ABNORMAL LOW (ref 4.22–5.81)
RDW: 15 % (ref 11.5–15.5)
WBC: 5.2 10*3/uL (ref 4.0–10.5)
nRBC: 0 % (ref 0.0–0.2)

## 2020-02-11 LAB — COMPREHENSIVE METABOLIC PANEL WITH GFR
ALT: 30 U/L (ref 0–44)
AST: 26 U/L (ref 15–41)
Albumin: 2.8 g/dL — ABNORMAL LOW (ref 3.5–5.0)
Alkaline Phosphatase: 48 U/L (ref 38–126)
Anion gap: 10 (ref 5–15)
BUN: 25 mg/dL — ABNORMAL HIGH (ref 8–23)
CO2: 28 mmol/L (ref 22–32)
Calcium: 8.2 mg/dL — ABNORMAL LOW (ref 8.9–10.3)
Chloride: 104 mmol/L (ref 98–111)
Creatinine, Ser: 1.21 mg/dL (ref 0.61–1.24)
GFR calc Af Amer: >60 mL/min
GFR calc non Af Amer: >60 mL/min
Glucose, Bld: 111 mg/dL — ABNORMAL HIGH (ref 70–99)
Potassium: 4.6 mmol/L (ref 3.5–5.1)
Sodium: 142 mmol/L (ref 135–145)
Total Bilirubin: 1.2 mg/dL (ref 0.3–1.2)
Total Protein: 4.6 g/dL — ABNORMAL LOW (ref 6.5–8.1)

## 2020-02-11 LAB — POCT I-STAT 7, (LYTES, BLD GAS, ICA,H+H)
Acid-Base Excess: 5 mmol/L — ABNORMAL HIGH (ref 0.0–2.0)
Bicarbonate: 29.1 mmol/L — ABNORMAL HIGH (ref 20.0–28.0)
Calcium, Ion: 1.17 mmol/L (ref 1.15–1.40)
HCT: 31 % — ABNORMAL LOW (ref 39.0–52.0)
Hemoglobin: 10.5 g/dL — ABNORMAL LOW (ref 13.0–17.0)
O2 Saturation: 89 %
Patient temperature: 96.5
Potassium: 3.8 mmol/L (ref 3.5–5.1)
Sodium: 142 mmol/L (ref 135–145)
TCO2: 30 mmol/L (ref 22–32)
pCO2 arterial: 37.2 mmHg (ref 32.0–48.0)
pH, Arterial: 7.497 — ABNORMAL HIGH (ref 7.350–7.450)
pO2, Arterial: 48 mmHg — ABNORMAL LOW (ref 83.0–108.0)

## 2020-02-11 LAB — BASIC METABOLIC PANEL
Anion gap: 7 (ref 5–15)
BUN: 28 mg/dL — ABNORMAL HIGH (ref 8–23)
CO2: 30 mmol/L (ref 22–32)
Calcium: 8 mg/dL — ABNORMAL LOW (ref 8.9–10.3)
Chloride: 105 mmol/L (ref 98–111)
Creatinine, Ser: 1.17 mg/dL (ref 0.61–1.24)
GFR calc Af Amer: >60 mL/min (ref >60)
GFR calc non Af Amer: >60 mL/min (ref >60)
Glucose, Bld: 120 mg/dL — ABNORMAL HIGH (ref 70–99)
Potassium: 4 mmol/L (ref 3.5–5.1)
Sodium: 142 mmol/L (ref 135–145)

## 2020-02-11 LAB — GLUCOSE, CAPILLARY
Glucose-Capillary: 102 mg/dL — ABNORMAL HIGH (ref 70–99)
Glucose-Capillary: 104 mg/dL — ABNORMAL HIGH (ref 70–99)
Glucose-Capillary: 116 mg/dL — ABNORMAL HIGH (ref 70–99)
Glucose-Capillary: 122 mg/dL — ABNORMAL HIGH (ref 70–99)
Glucose-Capillary: 139 mg/dL — ABNORMAL HIGH (ref 70–99)
Glucose-Capillary: 152 mg/dL — ABNORMAL HIGH (ref 70–99)

## 2020-02-11 MED ORDER — HALOPERIDOL LACTATE 5 MG/ML IJ SOLN
1.0000 mg | Freq: Four times a day (QID) | INTRAMUSCULAR | Status: DC | PRN
  Administered 2020-02-11: 1 mg via INTRAVENOUS
  Filled 2020-02-11 (×2): qty 1

## 2020-02-11 MED ORDER — LORAZEPAM 2 MG/ML IJ SOLN
2.0000 mg | INTRAMUSCULAR | Status: DC | PRN
  Administered 2020-02-11 – 2020-02-13 (×4): 2 mg via INTRAVENOUS
  Filled 2020-02-11 (×5): qty 1

## 2020-02-11 MED ORDER — METOPROLOL TARTRATE 5 MG/5ML IV SOLN
5.0000 mg | Freq: Four times a day (QID) | INTRAVENOUS | Status: DC
  Administered 2020-02-11 – 2020-02-16 (×7): 5 mg via INTRAVENOUS
  Filled 2020-02-11 (×11): qty 5

## 2020-02-11 MED ORDER — COLCHICINE 0.3 MG HALF TABLET
0.3000 mg | ORAL_TABLET | Freq: Two times a day (BID) | ORAL | Status: DC
  Administered 2020-02-11 – 2020-02-12 (×3): 0.3 mg
  Filled 2020-02-11 (×5): qty 1

## 2020-02-11 MED ORDER — CLOPIDOGREL BISULFATE 75 MG PO TABS
75.0000 mg | ORAL_TABLET | Freq: Every day | ORAL | Status: DC
  Administered 2020-02-11 – 2020-03-04 (×23): 75 mg
  Filled 2020-02-11 (×22): qty 1

## 2020-02-11 MED ORDER — LORAZEPAM BOLUS VIA INFUSION: 1.0000 mg | INTRAVENOUS | Status: DC | PRN

## 2020-02-11 MED ORDER — ADULT MULTIVITAMIN LIQUID CH
15.0000 mL | Freq: Every day | ORAL | Status: DC
  Filled 2020-02-11 (×2): qty 15

## 2020-02-11 MED ORDER — FOLIC ACID 1 MG PO TABS
1.0000 mg | ORAL_TABLET | Freq: Every day | ORAL | Status: DC
  Administered 2020-02-11: 1 mg
  Filled 2020-02-11: qty 1

## 2020-02-11 MED ORDER — ATORVASTATIN CALCIUM 10 MG PO TABS
10.0000 mg | ORAL_TABLET | Freq: Every day | ORAL | Status: DC
  Administered 2020-02-11 – 2020-03-04 (×23): 10 mg
  Filled 2020-02-11 (×22): qty 1

## 2020-02-11 MED ORDER — IPRATROPIUM-ALBUTEROL 0.5-2.5 (3) MG/3ML IN SOLN: 3.0000 mL | Freq: Three times a day (TID) | RESPIRATORY_TRACT | Status: DC

## 2020-02-11 MED ORDER — POLYETHYLENE GLYCOL 3350 17 G PO PACK
17.0000 g | PACK | Freq: Every day | ORAL | Status: DC
  Administered 2020-02-11 – 2020-02-18 (×4): 17 g
  Filled 2020-02-11 (×4): qty 1

## 2020-02-11 MED ORDER — ASPIRIN 81 MG PO CHEW: 81.0000 mg | CHEWABLE_TABLET | Freq: Every day | ORAL | Status: DC

## 2020-02-11 MED ORDER — LORAZEPAM 2 MG/ML IJ SOLN
1.0000 mg | INTRAMUSCULAR | Status: DC | PRN
  Administered 2020-02-11 (×2): 1 mg via INTRAVENOUS
  Filled 2020-02-11 (×2): qty 1

## 2020-02-11 MED ORDER — GUAIFENESIN 100 MG/5ML PO SOLN
15.0000 mL | Freq: Four times a day (QID) | ORAL | Status: DC
  Administered 2020-02-12 – 2020-02-19 (×31): 300 mg
  Filled 2020-02-11: qty 10
  Filled 2020-02-11: qty 15
  Filled 2020-02-11 (×2): qty 5
  Filled 2020-02-11 (×3): qty 15
  Filled 2020-02-11: qty 5
  Filled 2020-02-11: qty 15
  Filled 2020-02-11: qty 10
  Filled 2020-02-11 (×2): qty 5
  Filled 2020-02-11 (×4): qty 15
  Filled 2020-02-11: qty 5
  Filled 2020-02-11 (×5): qty 15
  Filled 2020-02-11: qty 5
  Filled 2020-02-11 (×5): qty 15
  Filled 2020-02-11 (×2): qty 10
  Filled 2020-02-11 (×2): qty 15
  Filled 2020-02-11 (×3): qty 5
  Filled 2020-02-11: qty 10
  Filled 2020-02-11: qty 5

## 2020-02-11 MED ORDER — ASPIRIN 81 MG PO CHEW
81.0000 mg | CHEWABLE_TABLET | Freq: Every day | ORAL | Status: DC
  Administered 2020-02-11 – 2020-03-04 (×23): 81 mg
  Filled 2020-02-11 (×24): qty 1

## 2020-02-11 MED ORDER — METOPROLOL TARTRATE 25 MG/10 ML ORAL SUSPENSION
12.5000 mg | Freq: Two times a day (BID) | ORAL | Status: DC
  Administered 2020-02-11: 12.5 mg

## 2020-02-11 MED ORDER — METOPROLOL TARTRATE 12.5 MG HALF TABLET: 12.5000 mg | ORAL_TABLET | Freq: Two times a day (BID) | ORAL | Status: DC

## 2020-02-11 MED ORDER — FOLIC ACID 5 MG/ML IJ SOLN
1.0000 mg | Freq: Every day | INTRAMUSCULAR | Status: DC
  Administered 2020-02-12 – 2020-03-02 (×18): 1 mg via INTRAVENOUS
  Filled 2020-02-11 (×20): qty 0.2

## 2020-02-11 MED ORDER — DOCUSATE SODIUM 50 MG/5ML PO LIQD
200.0000 mg | Freq: Every day | ORAL | Status: DC
  Administered 2020-02-12 – 2020-03-04 (×10): 200 mg
  Filled 2020-02-11 (×14): qty 20

## 2020-02-11 NOTE — Progress Notes (Signed)
CT surgery p.m. Rounds  Patient breathing comfortably with wet cough He is n.p.o. From chest tubes persisting Evidence of alcohol delirium withdrawal We will increase Ativan dose, order core track feeding tube

## 2020-02-11 NOTE — Progress Notes (Signed)
RT called by RN that patient had self extubated.  RT arrived to room and pt was on already on 5L [place].  RN turned sedation off.  RT will continue to monitor.  MD/NP updated.

## 2020-02-11 NOTE — Progress Notes (Signed)
NAME:  Tony Munoz, MRN:  [id number], DOB:  [birth year], LOS: 5 ADMISSION DATE:  02/06/2020, CONSULTATION DATE:  02/08/20 REFERRING MD:  Orvan Seen - CVTS, CHIEF COMPLAINT:  SOB, hypoxia   Brief History   64 yo M POD 3 CABG and L CEA, with progressive respiratory failure with hypoxia and hypercarbia   History of present illness   64 yo M with PMH CAD, HTN, who presented for CABG and  L CEA after workup for DOE (via stress test and LHC) demonstrated multivessel CAD. Pt had 80 % L ICA stenosis with contralateral occlusion. Pre-op eval reveals significant tobacco abuse, smoking 4 ppd for length of time, but recently with decrease to approx 1 pdd.   POD 2 (6/3) pt experienced progressive AMS and hypoxia. He was given morphine and ativan earlier in shift for tx of post-op pain and possible EtOH dts. Amidst pt hypoxic decompensation, pt received 19m Lasix empirically, with concern for possible flash pulmonary edema.  PCCM was asked to consult for progressive hypoxia. ABG at time of consult reveals hypoxic and hypercarbic respiratory failure.   CXR is pending at time of exam   Past Medical History  EtOH use  CAD  Anxiety Tobacco use disorder HTN  Significant Hospital Events   6/1 CABG and L CEA  6/2 receiving BZD for possible EtOH withdrawals. Was on precedex  6/3 worsening respiratory status with worsening hypoxia. Received 40 mg lasix. PCCM consulted   Consults:  Vascular surgery PCCM   Procedures:  6/1 CABG 6/1 L CEA   Significant Diagnostic Tests:  6/3 CXR >>>  Bronch: mucus plugs bilaterally, blue pill in BI   Micro Data:  6/5 BAL- few WBC, moderate yeast  Antimicrobials:  Cefuroxime 6/1 (surg ppx)  vanc 6/1 Unasyn 6/4>>  Interim history/subjective:  Intubated yesterday due to mucus plugging, respiratory distress and AMS. Bronched to removed mucus plugs- found FB (pill) in BI. Self-extubated this morning. He denies complaints.  Objective   Blood pressure 130/80, pulse 89,  temperature 98.1 F (36.7 C), temperature source Oral, resp. rate (!) 28, height _0  (1.753 m), weight 58.5 kg, SpO2 97 %.    Vent Mode: PRVC FiO2 (%):  [30 %] 30 % Set Rate:  [15 bmp] 15 bmp Vt Set:  [570 mL] 570 mL PEEP:  [5 cmH20-8 cmH20] 8 cmH20 Plateau Pressure:  [14 cmH20-16 cmH20] 14 cmH20   Intake/Output Summary (Last 24 hours) at 02/11/2020 1710 Last data filed at 02/11/2020 1606 Gross per 24 hour  Intake 1764.19 ml  Output 979 ml  Net 785.19 ml   Filed Weights   02/09/20 0500 02/10/20 0500 02/11/20 0500  Weight: 56.9 kg 58.6 kg 58.5 kg    Examination: General: Chronically ill, cachectic man lying in bed in no acute distress HEENT: [place]/AT, eyes anicteric.  Temporal wasting.  Oral mucosa moist. Neuro: Awake but less interactive today.  Follows commands sluggishly.  Very weak cough. CV: Regular rate and rhythm, paced on telemetry.  No murmurs.  Well-healed sternotomy scar. PULM: Breathing comfortably on nasal cannula, less rhonchi.  No accessory muscle use.  Very weak cough. GI: Thin, soft, nontender Extremities: No cyanosis or edema Skin: healing sternotomy and L carotid scars  Resolved Hospital Problem list     Assessment & Plan:   Acute encephalopathy -In the setting of hypoxia, hypercarbia, possible medication related in setting of CIWA protocol meds and post-op pain meds -EtOH use disorder with possible EtOH withdrawals  P: -Continue supplemental vitamins-IV until enteral  access history obtained -Can resume CIWA if required -Frequent reorientation  Acute hypoxic and hypercarbic respiratory failure Aspiration-right lower lobe foreign body found during bronchoscopy Hx tobacco use HAP -Possible COPD exacerbation, possible component of post-op atelectasis. Wheezing + crackles on exam, likely element of pulm edema -CXR with continued right lower opacity concerning for pneumonia, WBC improved with no fever P -aggressive CPT every 2 hours today -NT suction as  needed -Supplemental oxygen as required to maintain SPO2 greater than 88% and appropriate work of breathing. -Remain concerned that he is at high risk for repeat aspiration and repeat respiratory failure requiring mechanical ventilation. -Continue bronchodilators -Continue Unasyn -Continue to follow respiratory cultures -SLP evaluation after extubation; strict n.p.o.  Coretrak ordered.  CABG 6/1 L CEA 6/1 P: -Continue management per to CTS -Continue aspirin and Plavix  H/o HTN P: -When sedation wears off may be able to resume antihypertensives.  Afib P: -Continue amiodarone -Continue telemetry monitoring.  Thrombocytopenia -Holding DVT prophylaxis and anticoagulation -Continue to monitor  Cachexia, frailty P: -will resume TF once enteral access obtained  Best practice:  Diet: NPO  Pain/Anxiety/Delirium protocol (if indicated): PRN per primary  VAP protocol (if indicated): na DVT prophylaxis: per primary, post op  GI prophylaxis: protonix  Glucose control: SSI  Mobility: BR Code Status: Full  Family Communication: updated friend Renee at bedside 6/6 Disposition: ICU   Labs   CBC: Recent Labs  Lab 02/07/20 1555 02/07/20 1555 02/08/20 0308 02/08/20 0308 02/08/20 1756 02/09/20 0326 02/10/20 0355 02/10/20 1635 02/11/20 0321  WBC 10.8*  --  10.4  --   --  5.6 5.6  --  5.2  HGB 11.0*   < > 10.5*   < > 11.2* 10.2* 9.6* 9.5* 9.6*  HCT 32.5*   < > 31.2*   < > 33.0* 30.9* 28.8* 28.0* 30.0*  MCV 92.3  --  94.0  --   --  93.9 96.0  --  97.4  PLT 91*  --  84*  --   --  70* 71*  --  83*   < > = values in this interval not displayed.    Basic Metabolic Panel: Recent Labs  Lab 02/06/20 1956 02/06/20 2111 02/07/20 0316 02/07/20 0349 02/07/20 1555 02/07/20 1555 02/08/20 0308 02/08/20 1756 02/09/20 0326 02/10/20 0355 02/10/20 1635 02/11/20 0321 02/11/20 1609  NA 142   < > 140   < > 138   < > 139   < > 137 140 140 142 142  K 4.2   < > 3.9   < > 4.3   < >  4.3   < > 3.5 3.6 4.5 4.6 4.0  CL 111   < > 111   < > 107   < > 104  --  99 101  --  104 105  CO2 23   < > 22   < > 25   < > 24  --  28 30  --  28 30  GLUCOSE 118*   < > 142*   < > 110*   < > 101*  --  150* 110*  --  111* 120*  BUN 7*   < > 8   < > 8   < > 13  --  14 15  --  25* 28*  CREATININE 0.78   < > 0.85   < > 0.83   < > 0.95  --  0.96 0.83  --  1.21 1.17  CALCIUM 7.2*   < >  7.6*   < > 7.8*   < > 7.4*  --  8.0* 8.2*  --  8.2* 8.0*  MG 3.3*  --  2.4  --  2.4  --   --   --   --   --   --   --   --    < > = values in this interval not displayed.   GFR: Estimated Creatinine Clearance: 52.1 mL/min (by C-G formula based on SCr of 1.17 mg/dL). Recent Labs  Lab 02/08/20 0308 02/09/20 0326 02/10/20 0355 02/11/20 0321  WBC 10.4 5.6 5.6 5.2    Liver Function Tests: Recent Labs  Lab 02/11/20 0321  AST 26  ALT 30  ALKPHOS 48  BILITOT 1.2  PROT 4.6*  ALBUMIN 2.8*   No results for input(s): LIPASE, AMYLASE in the last 168 hours. No results for input(s): AMMONIA in the last 168 hours.  ABG    Component Value Date/Time   PHART 7.441 02/10/2020 1635   PCO2ART 48.4 (H) 02/10/2020 1635   PO2ART 494 (H) 02/10/2020 1635   HCO3 33.3 (H) 02/10/2020 1635   TCO2 35 (H) 02/10/2020 1635   ACIDBASEDEF 3.0 (H) 02/06/2020 2222   O2SAT 100.0 02/10/2020 1635     Coagulation Profile: Recent Labs  Lab 02/06/20 1528  INR 1.8*    Cardiac Enzymes: No results for input(s): CKTOTAL, CKMB, CKMBINDEX, TROPONINI in the last 168 hours.  HbA1C: Hgb A1c MFr Bld  Date/Time Value Ref Range Status  02/02/2020 09:57 AM 5.2 4.8 - 5.6 % Final    Comment:    (NOTE) Pre diabetes:          5.7%-6.4% Diabetes:              >6.4% Glycemic control for   <7.0% adults with diabetes     CBG: Recent Labs  Lab 02/11/20 0055 02/11/20 0312 02/11/20 0754 02/11/20 1122 02/11/20 1538  GLUCAP 102* 104* 116* 152* 122*      This patient is critically ill with multiple organ system failure which  requires frequent high complexity decision making, assessment, support, evaluation, and titration of therapies. This was completed through the application of advanced monitoring technologies and extensive interpretation of multiple databases. During this encounter critical care time was devoted to patient care services described in this note for 38 minutes.  Julian Hy, DO 02/11/20 5:21 PM  Pulmonary & Critical Care

## 2020-02-11 NOTE — Progress Notes (Signed)
5 Days Post-Op Procedure(s) (LRB): CORONARY ARTERY BYPASS GRAFTING (CABG),  x Four with Bilateral IMAs, and right leg greater saphenous vein (N/A) TRANSESOPHAGEAL ECHOCARDIOGRAM (TEE) (N/A) INDOCYANINE GREEN FLUORESCENCE IMAGING (ICG) (N/A) CAROTID ENDARTERECTOMY LEFT with  Patch Angioplasty Using Hemashield Platinum Finesse patch (Left) Subjective: CABG with carotid endarterectomy History of severe COPD and alcohol abuse Patient self extubated today after reintubation yesterday for excessive secretions and airway protection Having delirium consistent with alcohol withdrawal-treated and patient restrained Pain sinus rhythm on IV amiodarone  Objective: Vital signs in last 24 hours: Temp:  [95.5 F (35.3 C)-99.1 F (37.3 C)] 97.2 F (36.2 C) (06/06 1120) Pulse Rate:  [88-115] 89 (06/06 1200) Cardiac Rhythm: Atrial paced (06/06 1200) Resp:  [15-60] 44 (06/06 1200) BP: (68-163)/(55-126) 129/82 (06/06 1200) SpO2:  [96 %-100 %] 99 % (06/06 1200) FiO2 (%):  [30 %-100 %] 30 % (06/06 0753) Weight:  [58.5 kg] 58.5 kg (06/06 0500)  Hemodynamic parameters for last 24 hours:    Intake/Output from previous day: 06/05 0701 - 06/06 0700 In: 1476 [I.V.:1118.8; IV Piggyback:357.2] Out: 962 [Urine:667; Chest Tube:295] Intake/Output this shift: Total I/O In: 567.1 [I.V.:367; IV Piggyback:200.1] Out: 215 [Urine:215]       Exam    General-sedated after self extubation and breathing comfortably Neck- no JVD, no cervical adenopathy palpable, no carotid bruit   Lungs- clear without rales, wheezes   Cor- regular rate and rhythm, no murmur , gallop   Abdomen- soft, non-tender   Extremities - warm, non-tender, minimal edema   Neuro- oriented, appropriate, no focal weakness   Lab Results: Recent Labs    02/10/20 0355 02/10/20 0355 02/10/20 1635 02/11/20 0321  WBC 5.6  --   --  5.2  HGB 9.6*   < > 9.5* 9.6*  HCT 28.8*   < > 28.0* 30.0*  PLT 71*  --   --  83*   < > = values in this  interval not displayed.   BMET:  Recent Labs    02/10/20 0355 02/10/20 0355 02/10/20 1635 02/11/20 0321  NA 140   < > 140 142  K 3.6   < > 4.5 4.6  CL 101  --   --  104  CO2 30  --   --  28  GLUCOSE 110*  --   --  111*  BUN 15  --   --  25*  CREATININE 0.83  --   --  1.21  CALCIUM 8.2*  --   --  8.2*   < > = values in this interval not displayed.    PT/INR: No results for input(s): LABPROT, INR in the last 72 hours. ABG    Component Value Date/Time   PHART 7.441 02/10/2020 1635   HCO3 33.3 (H) 02/10/2020 1635   TCO2 35 (H) 02/10/2020 1635   ACIDBASEDEF 3.0 (H) 02/06/2020 2222   O2SAT 100.0 02/10/2020 1635   CBG (last 3)  Recent Labs    02/11/20 0312 02/11/20 0754 02/11/20 1122  GLUCAP 104* 116* 152*    Assessment/Plan: S/P Procedure(s) (LRB): CORONARY ARTERY BYPASS GRAFTING (CABG),  x Four with Bilateral IMAs, and right leg greater saphenous vein (N/A) TRANSESOPHAGEAL ECHOCARDIOGRAM (TEE) (N/A) INDOCYANINE GREEN FLUORESCENCE IMAGING (ICG) (N/A) CAROTID ENDARTERECTOMY LEFT with  Patch Angioplasty Using Hemashield Platinum Finesse patch (Left) Follow respiratory status check ABG Treat delirium from alcohol withdrawal N.p.o. history of aspiration Continue IV antibiotics for aspiration pneumonia   LOS: 5 days    Kathlee Nations Trigt III 02/11/2020

## 2020-02-11 NOTE — Progress Notes (Signed)
  Speech Language Pathology Treatment: Dysphagia  Patient Details Name: Tony Munoz MRN: [id number] DOB: April 21, 1956 Today's Date: 02/11/2020 Time: 5956-3875 SLP Time Calculation (min) (ACUTE ONLY): 8 min  Assessment / Plan / Recommendation Clinical Impression  Pt was seen for skilled ST targeting diagnostic treatment.  Spoke with RN who reported that the pt was intubated yesterday and that he self-extubated this morning.  He was on nasal cannula and in bilateral soft wrist restraints during this session.  RN additionally reported that the pt underwent a bronchoscopy yesterday, and that a tablet was discovered in his right bronchus intermedius.  It was removed after discovery.  Pt was encountered awake, but lethargic in bed and he was noted to have increased work of breathing.  Oral care was provided with suction swab and pt tolerated without difficulty. Pt required max verbal and tactile cues to achieve a labial seal around the [place] for suction and to achieve labial closure at rest.  Due to increased work of breathing, known aspiration, and lethargy, PO trials were not attempted at this time secondary to high risk of aspiration.  Pt would benefit from an instrumental swallow study to further evaluate his swallow function when his respiratory status is stable.  Recommend strict NPO with frequent oral care and consideration of short-term alternative means of nutrition until instrumental swallow study is completed.  SLP will f/u to determine readiness for instrumental swallow study.     HPI HPI: 64 y.o male presented for surgery 02/06/20 for CABGx4 , bilateral IMA, carotid endarterectomy. Complications from ETOH, worsening respiratory function requiring BiPAP. Extubated 06/02, off BiPAP 06/04.  PMH radiculopathy, HTN, dyspnea, smoker, carotid artery occulusion, anxiety.       SLP Plan  MBS       Recommendations  Diet recommendations: NPO Medication Administration: Via alternative means                Oral Care Recommendations: Oral care QID Follow up Recommendations: Skilled Nursing facility SLP Visit Diagnosis: Dysphagia, unspecified (R13.10) Plan: MBS       Villa Herb., M.S., CCC-SLP Acute Rehabilitation Services Office: [phone]  Shanon Rosser [name] 02/11/2020, 2:12 PM

## 2020-02-11 NOTE — Progress Notes (Signed)
Spoke with Canary Brim RN re PICC placement.  States to hold on PICC placement per conversation with Dr Donata Clay last pm.  RN to readdress today and notify [facility] if PICC still needed vs cancelling PICC order.

## 2020-02-11 NOTE — Progress Notes (Signed)
Patient's friend Luster Landsberg visited. States she and her husband look after patient and that he drinks at least a fifth of hard liquor/day and possibly more.Patient continues to be verbally abusive and irritable and attempting to get out of bed. Patient reports seeing people in the room when none are there. Dr. Donata Clay ordered PRN ativan.

## 2020-02-11 NOTE — Progress Notes (Signed)
Approximately 1030 patient loosened right arm restraint, pulled ETT and self extubated. ETCO2 placed, Dr. Chestine Spore assessed patient.

## 2020-02-12 ENCOUNTER — Inpatient Hospital Stay (HOSPITAL_COMMUNITY): Payer: Medicare HMO

## 2020-02-12 DIAGNOSIS — R0602 Shortness of breath: Secondary | ICD-10-CM

## 2020-02-12 DIAGNOSIS — I340 Nonrheumatic mitral (valve) insufficiency: Secondary | ICD-10-CM

## 2020-02-12 LAB — POCT I-STAT 7, (LYTES, BLD GAS, ICA,H+H)
Acid-Base Excess: 4 mmol/L — ABNORMAL HIGH (ref 0.0–2.0)
Acid-Base Excess: 5 mmol/L — ABNORMAL HIGH (ref 0.0–2.0)
Acid-Base Excess: 5 mmol/L — ABNORMAL HIGH (ref 0.0–2.0)
Bicarbonate: 29.6 mmol/L — ABNORMAL HIGH (ref 20.0–28.0)
Bicarbonate: 29.8 mmol/L — ABNORMAL HIGH (ref 20.0–28.0)
Bicarbonate: 29 mmol/L — ABNORMAL HIGH (ref 20.0–28.0)
Calcium, Ion: 1.18 mmol/L (ref 1.15–1.40)
Calcium, Ion: 1.18 mmol/L (ref 1.15–1.40)
Calcium, Ion: 1.17 mmol/L (ref 1.15–1.40)
HCT: 32 % — ABNORMAL LOW (ref 39.0–52.0)
HCT: 29 % — ABNORMAL LOW (ref 39.0–52.0)
HCT: 26 % — ABNORMAL LOW (ref 39.0–52.0)
Hemoglobin: 10.9 g/dL — ABNORMAL LOW (ref 13.0–17.0)
Hemoglobin: 9.9 g/dL — ABNORMAL LOW (ref 13.0–17.0)
Hemoglobin: 8.8 g/dL — ABNORMAL LOW (ref 13.0–17.0)
O2 Saturation: 85 %
O2 Saturation: 100 %
O2 Saturation: 88 %
Patient temperature: 97.6
Patient temperature: 97.6
Patient temperature: 97.8
Potassium: 3.9 mmol/L (ref 3.5–5.1)
Potassium: 3.9 mmol/L (ref 3.5–5.1)
Potassium: 4 mmol/L (ref 3.5–5.1)
Sodium: 143 mmol/L (ref 135–145)
Sodium: 142 mmol/L (ref 135–145)
Sodium: 143 mmol/L (ref 135–145)
TCO2: 31 mmol/L (ref 22–32)
TCO2: 31 mmol/L (ref 22–32)
TCO2: 30 mmol/L (ref 22–32)
pCO2 arterial: 45.4 mmHg (ref 32.0–48.0)
pCO2 arterial: 42 mmHg (ref 32.0–48.0)
pCO2 arterial: 41 mmHg (ref 32.0–48.0)
pH, Arterial: 7.42 (ref 7.350–7.450)
pH, Arterial: 7.457 — ABNORMAL HIGH (ref 7.350–7.450)
pH, Arterial: 7.456 — ABNORMAL HIGH (ref 7.350–7.450)
pO2, Arterial: 48 mmHg — ABNORMAL LOW (ref 83.0–108.0)
pO2, Arterial: 157 mmHg — ABNORMAL HIGH (ref 83.0–108.0)
pO2, Arterial: 51 mmHg — ABNORMAL LOW (ref 83.0–108.0)

## 2020-02-12 LAB — CBC
HCT: 31.8 % — ABNORMAL LOW (ref 39.0–52.0)
Hemoglobin: 10.1 g/dL — ABNORMAL LOW (ref 13.0–17.0)
MCH: 31 pg (ref 26.0–34.0)
MCHC: 31.8 g/dL (ref 30.0–36.0)
MCV: 97.5 fL (ref 80.0–100.0)
Platelets: 106 10*3/uL — ABNORMAL LOW (ref 150–400)
RBC: 3.26 MIL/uL — ABNORMAL LOW (ref 4.22–5.81)
RDW: 14.6 % (ref 11.5–15.5)
WBC: 9.6 10*3/uL (ref 4.0–10.5)
nRBC: 0 % (ref 0.0–0.2)

## 2020-02-12 LAB — COMPREHENSIVE METABOLIC PANEL
ALT: 33 U/L (ref 0–44)
AST: 30 U/L (ref 15–41)
Albumin: 2.8 g/dL — ABNORMAL LOW (ref 3.5–5.0)
Alkaline Phosphatase: 55 U/L (ref 38–126)
Anion gap: 9 (ref 5–15)
BUN: 31 mg/dL — ABNORMAL HIGH (ref 8–23)
CO2: 29 mmol/L (ref 22–32)
Calcium: 8 mg/dL — ABNORMAL LOW (ref 8.9–10.3)
Chloride: 104 mmol/L (ref 98–111)
Creatinine, Ser: 1.16 mg/dL (ref 0.61–1.24)
GFR calc Af Amer: >60 mL/min (ref >60)
GFR calc non Af Amer: >60 mL/min (ref >60)
Glucose, Bld: 107 mg/dL — ABNORMAL HIGH (ref 70–99)
Potassium: 4.1 mmol/L (ref 3.5–5.1)
Sodium: 142 mmol/L (ref 135–145)
Total Bilirubin: 1.2 mg/dL (ref 0.3–1.2)
Total Protein: 4.6 g/dL — ABNORMAL LOW (ref 6.5–8.1)

## 2020-02-12 LAB — CULTURE, BAL-QUANTITATIVE W GRAM STAIN
Culture: 80000 — AB
Special Requests: NORMAL

## 2020-02-12 LAB — ECHOCARDIOGRAM LIMITED
Height: 69 in
Weight: 2014.12 oz

## 2020-02-12 LAB — GLUCOSE, CAPILLARY
Glucose-Capillary: 104 mg/dL — ABNORMAL HIGH (ref 70–99)
Glucose-Capillary: 108 mg/dL — ABNORMAL HIGH (ref 70–99)
Glucose-Capillary: 112 mg/dL — ABNORMAL HIGH (ref 70–99)
Glucose-Capillary: 113 mg/dL — ABNORMAL HIGH (ref 70–99)
Glucose-Capillary: 146 mg/dL — ABNORMAL HIGH (ref 70–99)
Glucose-Capillary: 162 mg/dL — ABNORMAL HIGH (ref 70–99)
Glucose-Capillary: 83 mg/dL (ref 70–99)

## 2020-02-12 LAB — MAGNESIUM
Magnesium: 2.1 mg/dL (ref 1.7–2.4)
Magnesium: 1.9 mg/dL (ref 1.7–2.4)

## 2020-02-12 LAB — PHOSPHORUS
Phosphorus: 3 mg/dL (ref 2.5–4.6)
Phosphorus: 2.5 mg/dL (ref 2.5–4.6)

## 2020-02-12 MED ORDER — DEXMEDETOMIDINE HCL IN NACL 400 MCG/100ML IV SOLN: 0.4000 ug/kg/h | INTRAVENOUS | Status: DC

## 2020-02-12 MED ORDER — MIDAZOLAM HCL 2 MG/2ML IJ SOLN
INTRAMUSCULAR | Status: AC
  Filled 2020-02-12: qty 2

## 2020-02-12 MED ORDER — VITAL AF 1.2 CAL PO LIQD
1000.0000 mL | ORAL | Status: DC
  Administered 2020-02-12 – 2020-02-22 (×7): 1000 mL
  Filled 2020-02-12 (×2): qty 1000

## 2020-02-12 MED ORDER — SODIUM CHLORIDE 0.9% FLUSH
10.0000 mL | Freq: Two times a day (BID) | INTRAVENOUS | Status: DC
  Administered 2020-02-12 – 2020-02-13 (×3): 10 mL
  Administered 2020-02-14: 20 mL
  Administered 2020-02-15: 30 mL
  Administered 2020-02-16 – 2020-02-22 (×9): 10 mL
  Administered 2020-02-23: 40 mL
  Administered 2020-02-23 – 2020-02-24 (×3): 10 mL
  Administered 2020-02-27 (×2): 40 mL
  Administered 2020-02-28 – 2020-03-03 (×10): 10 mL

## 2020-02-12 MED ORDER — SODIUM CHLORIDE 0.9% FLUSH: 10.0000 mL | INTRAVENOUS | Status: DC | PRN

## 2020-02-12 MED ORDER — ORAL CARE MOUTH RINSE
15.0000 mL | OROMUCOSAL | Status: DC
  Administered 2020-02-12 – 2020-03-04 (×162): 15 mL via OROMUCOSAL

## 2020-02-12 MED ORDER — FENTANYL CITRATE (PF) 100 MCG/2ML IJ SOLN
INTRAMUSCULAR | Status: AC
  Filled 2020-02-12: qty 2

## 2020-02-12 MED ORDER — ROCURONIUM BROMIDE 50 MG/5ML IV SOLN
50.0000 mg | Freq: Once | INTRAVENOUS | Status: AC
  Administered 2020-02-12: 50 mg via INTRAVENOUS

## 2020-02-12 MED ORDER — DEXMEDETOMIDINE HCL IN NACL 400 MCG/100ML IV SOLN
0.4000 ug/kg/h | INTRAVENOUS | Status: DC
  Administered 2020-02-12: 1.1 ug/kg/h via INTRAVENOUS
  Administered 2020-02-12: 0.7 ug/kg/h via INTRAVENOUS
  Administered 2020-02-13: 1.2 ug/kg/h via INTRAVENOUS
  Administered 2020-02-14: 1 ug/kg/h via INTRAVENOUS
  Administered 2020-02-14: 0.8 ug/kg/h via INTRAVENOUS
  Administered 2020-02-14: 1.2 ug/kg/h via INTRAVENOUS
  Administered 2020-02-15 – 2020-02-16 (×6): 1 ug/kg/h via INTRAVENOUS
  Administered 2020-02-16 – 2020-02-17 (×2): 1.1 ug/kg/h via INTRAVENOUS
  Administered 2020-02-17: 1.128 ug/kg/h via INTRAVENOUS
  Administered 2020-02-17: 1.1 ug/kg/h via INTRAVENOUS
  Administered 2020-02-18 – 2020-02-20 (×9): 1.2 ug/kg/h via INTRAVENOUS
  Filled 2020-02-12 (×5): qty 100
  Filled 2020-02-12: qty 200
  Filled 2020-02-12 (×4): qty 100
  Filled 2020-02-12: qty 200
  Filled 2020-02-12 (×15): qty 100

## 2020-02-12 MED ORDER — ETOMIDATE 2 MG/ML IV SOLN
10.0000 mg | Freq: Once | INTRAVENOUS | Status: AC
  Administered 2020-02-12: 10 mg via INTRAVENOUS

## 2020-02-12 MED ORDER — CHLORHEXIDINE GLUCONATE 0.12% ORAL RINSE (MEDLINE KIT)
15.0000 mL | Freq: Two times a day (BID) | OROMUCOSAL | Status: DC
  Administered 2020-02-13 – 2020-03-04 (×36): 15 mL via OROMUCOSAL

## 2020-02-12 MED ORDER — FENTANYL CITRATE (PF) 100 MCG/2ML IJ SOLN
50.0000 ug | Freq: Once | INTRAMUSCULAR | Status: AC
  Administered 2020-02-12: 50 ug via INTRAVENOUS

## 2020-02-12 MED ORDER — ADULT MULTIVITAMIN LIQUID CH
15.0000 mL | Freq: Every day | ORAL | Status: DC
  Administered 2020-02-13 – 2020-02-19 (×7): 15 mL
  Filled 2020-02-12 (×7): qty 15

## 2020-02-12 MED ORDER — MIDAZOLAM HCL 2 MG/2ML IJ SOLN
2.0000 mg | Freq: Once | INTRAMUSCULAR | Status: AC
  Administered 2020-02-12: 2 mg via INTRAVENOUS

## 2020-02-12 MED ORDER — THIAMINE HCL 100 MG/ML IJ SOLN
Freq: Once | INTRAVENOUS | Status: AC
  Filled 2020-02-12: qty 1000

## 2020-02-12 MED ORDER — IPRATROPIUM-ALBUTEROL 0.5-2.5 (3) MG/3ML IN SOLN
3.0000 mL | RESPIRATORY_TRACT | Status: DC
  Administered 2020-02-12 – 2020-02-13 (×7): 3 mL via RESPIRATORY_TRACT
  Filled 2020-02-12 (×7): qty 3

## 2020-02-12 MED ORDER — THIAMINE HCL 100 MG PO TABS
100.0000 mg | ORAL_TABLET | Freq: Every day | ORAL | Status: DC
  Administered 2020-02-13 – 2020-02-27 (×15): 100 mg
  Filled 2020-02-12 (×15): qty 1

## 2020-02-12 NOTE — Progress Notes (Signed)
Pharmacy Antibiotic Note  Tony Munoz is a 64 y.o. male admitted on 02/06/2020 with suspected aspiration PNA.  Pharmacy has been consulted for Unasyn dosing.   Patient currently afebrile and WBC WNL. Renal function within normal limits.  Aspirated pill found on bronch 6/4  Plan: Continue Unasyn 3g IV q 6 hrs F/u renal function, clinical course. F/u LOT.  Height: _0  (175.3 cm) Weight: 57.1 kg (125 lb 14.1 oz) IBW/kg (Calculated) : 70.7  Temp (24hrs), Avg:97.4 F (36.3 C), Min:96.5 F (35.8 C), Max:98.1 F (36.7 C)  Recent Labs  Lab 02/08/20 0308 02/08/20 0308 02/09/20 0326 02/10/20 0355 02/11/20 0321 02/11/20 1609 02/12/20 0426  WBC 10.4  --  5.6 5.6 5.2  --  9.6  CREATININE 0.95   < > 0.96 0.83 1.21 1.17 1.16   < > = values in this interval not displayed.    Estimated Creatinine Clearance: 51.3 mL/min (by C-G formula based on SCr of 1.16 mg/dL).    No Known Allergies  Antimicrobials this admission:  Unasyn 6/4 >  Cefuroxime 6/1>6/3 Vanc 6/1>6/1  Microbiology results:  6/5 BAL - candida tropicalis  Thank you for allowing pharmacy to be a part of this patient's care.  Erin Hearing PharmD., BCPS Clinical Pharmacist 02/12/2020 12:06 PM

## 2020-02-12 NOTE — Progress Notes (Signed)
Peripherally Inserted Central Catheter Placement  The IV Nurse has discussed with the patient and/or persons authorized to consent for the patient, the purpose of this procedure and the potential benefits and risks involved with this procedure.  The benefits include less needle sticks, lab draws from the catheter, and the patient may be discharged home with the catheter. Risks include, but not limited to, infection, bleeding, blood clot (thrombus formation), and puncture of an artery; nerve damage and irregular heartbeat and possibility to perform a PICC exchange if needed/ordered by physician.  Alternatives to this procedure were also discussed.  Bard Power PICC patient education guide, fact sheet on infection prevention and patient information card has been provided to patient /or left at bedside.    PICC Placement Documentation  PICC Triple Lumen 02/12/20 PICC Right Basilic 41 cm 0 cm (Active)  Exposed Catheter (cm) 0 cm 02/12/20 1225  Site Assessment Clean;Dry;Intact 02/12/20 1225  Lumen #1 Status Flushed;Blood return noted;Saline locked 02/12/20 1225  Lumen #2 Status Flushed;Blood return noted;Saline locked 02/12/20 1225  Lumen #3 Status Flushed;Blood return noted;Saline locked 02/12/20 1225  Dressing Type Transparent;Securing device 02/12/20 1225  Dressing Status Clean;Dry;Intact;Antimicrobial disc in place 02/12/20 1225  Dressing Change Due 02/19/20 02/12/20 1225       Jamario, Colina 02/12/2020, 12:28 PM

## 2020-02-12 NOTE — Progress Notes (Signed)
SLP Cancellation Note  Patient Details Name: Tony Munoz MRN: [id number] DOB: [birth year]   Cancelled treatment:       Reason Eval/Treat Not Completed: Medical issues which prohibited therapy(Pt intubated this morning. SLP will follow up. )  Marvyn Torrez I. Vear Clock, MS, CCC-SLP Acute Rehabilitation Services Office number [phone] Pager [phone]  [name] 02/12/2020, 8:52 AM

## 2020-02-12 NOTE — Progress Notes (Signed)
Nutrition Follow-up  DOCUMENTATION CODES:   Severe malnutrition in context of chronic illness  INTERVENTION:   Tube Feeding via Cortrak:  Vital AF 1.2 at 55 ml/hr Begin at 20 ml/hr; titrate by 10 mL q 8 hours until goal rate of 55 ml/hr Provides 99 g of protein, 1584 kcals and 1069 mL of free water  NUTRITION DIAGNOSIS:   Severe Malnutrition related to chronic illness(cerebrovascular disease) as evidenced by severe fat depletion, severe muscle depletion.  Being addressed via TF   GOAL:   Patient will meet greater than or equal to 90% of their needs  Progressing  MONITOR:   PO intake, Supplement acceptance, Weight trends, Labs, I & O's, Skin  REASON FOR ASSESSMENT:   Rounds    ASSESSMENT:   Patient with PMH significant for HTN, severe cerebrovascular disease, CAD, and s/p multilevel C-spine fusion a few months ago. Presents this admission for CABG surgery.  6/01 CABG x 4, Left CEA 6/05 Intubated, Bronch with removal of aspirated pill  6/06 Self-Extubated 6/07 Re-Intubated, Cortrak  Patient is currently intubated on ventilator support MV: 10.4 L/min Temp (24hrs), Avg:97.4 F (36.3 C), Min:96.5 F (35.8 C), Max:98.1 F (36.7 C)  Propofol: none  Currently receiving Banana Bag  Spoke with RN who indicates that pt's neighbor notified her that they checked on patient's house and found many empty bottles of alcohol. Unsure how much pt has been drinking. Noted pt with hx of smoking 4 ppd for a while but recently reduced to 1 ppd..   Net negative 2 L; weight down to 57 kg. Post op weight 60 kg. Admit weight 52 kg  No BM since admission; noted orders for dulcolax daily, colace and miralax.  Labs: reviewed Meds: folic acid, ss novolog, solumedrol, liquid MCI, thiamine  Diet Order:   Diet Order            Diet NPO time specified  Diet effective now              EDUCATION NEEDS:   Education needs have been addressed  Skin:  Skin Assessment: Skin  Integrity Issues: Skin Integrity Issues:: Incisions Incisions: R leg, chest, neck  Last BM:  no documented BM since admission, abdomen soft, BS present  Height:   Ht Readings from Last 1 Encounters:  02/06/20 5\' 9"  (1.753 m)    Weight:   Wt Readings from Last 1 Encounters:  02/12/20 57.1 kg    Ideal Body Weight:     BMI:  Body mass index is 18.59 kg/m.  Estimated Nutritional Needs:   Kcal:  1542 kcals  Protein:  90-115 g  Fluid:  >/= 1.8 L/day    04/13/20 MS, RDN, LDN, CNSC Registered Dietitian III RD Pager Number and RD On-Call Pager Number Located in [place]

## 2020-02-12 NOTE — Progress Notes (Signed)
Mixed venous sample obtained on ABG at 1254. Will notify MD.

## 2020-02-12 NOTE — Progress Notes (Signed)
OT Cancellation Note  Patient Details Name: Tony Munoz MRN: [id number] DOB: May 23, 1956   Cancelled Treatment:    Reason Eval/Treat Not Completed: Medical issues which prohibited therapy, pt re-intubated this AM. Will follow up for OT eval as able.  Marcy Siren, OT Acute Rehabilitation Services Pager [phone] Office [phone]   Tony Munoz 02/12/2020, 8:06 AM

## 2020-02-12 NOTE — Progress Notes (Signed)
[address]       [place] [address]             [phone]      6 Days Post-Op Procedure(s) (LRB): CORONARY ARTERY BYPASS GRAFTING (CABG),  x Four with Bilateral IMAs, and right leg greater saphenous vein (N/A) TRANSESOPHAGEAL ECHOCARDIOGRAM (TEE) (N/A) INDOCYANINE GREEN FLUORESCENCE IMAGING (ICG) (N/A) CAROTID ENDARTERECTOMY LEFT with  Patch Angioplasty Using Hemashield Platinum Finesse patch (Left) Subjective: Just re-intubated for respiratory distress  Objective: Vital signs in last 24 hours: Temp:  [96.5 F (35.8 C)-98.1 F (36.7 C)] 97.8 F (36.6 C) (06/07 0750) Pulse Rate:  [55-109] 95 (06/07 0600) Cardiac Rhythm: Normal sinus rhythm (06/07 0400) Resp:  [17-51] 19 (06/07 0600) BP: (114-172)/(79-111) 143/100 (06/07 0600) SpO2:  [88 %-100 %] 100 % (06/07 0600) Weight:  [57.1 kg] 57.1 kg (06/07 0600)  Hemodynamic parameters for last 24 hours:     Intake/Output from previous day: 06/06 0701 - 06/07 0700 In: 1610.1 [I.V.:1210; IV Piggyback:400.1] Out: 1564 [Urine:960; Chest Tube:604] Intake/Output this shift: No intake/output data recorded.  General appearance: sedated on vent Heart: regular rate and rhythm Lungs: some upper airway ronchi Abdomen: soft, non-distended Extremities: right foot edema Wound: incis healing ok, + echymosis all incisions  Lab Results: Recent Labs    02/11/20 0321 02/11/20 1959 02/12/20 0426 02/12/20 0457  WBC 5.2  --  9.6  --   HGB 9.6*   < > 10.1* 9.9*  HCT 30.0*   < > 31.8* 29.0*  PLT 83*  --  106*  --    < > = values in this interval not displayed.   BMET:  Recent Labs    02/11/20 1609 02/11/20 1959 02/12/20 0426 02/12/20 0457  NA 142   < > 142 142  K 4.0   < > 4.1 3.9  CL 105  --  104  --   CO2 30  --  29  --   GLUCOSE 120*  --  107*  --   BUN 28*  --  31*  --   CREATININE 1.17  --  1.16  --   CALCIUM 8.0*  --  8.0*  --    < > = values in this interval not displayed.    PT/INR: No  results for input(s): LABPROT, INR in the last 72 hours. ABG    Component Value Date/Time   PHART 7.457 (H) 02/12/2020 0457   HCO3 29.8 (H) 02/12/2020 0457   TCO2 31 02/12/2020 0457   ACIDBASEDEF 3.0 (H) 02/06/2020 2222   O2SAT 100.0 02/12/2020 0457   CBG (last 3)  Recent Labs    02/12/20 0019 02/12/20 0359 02/12/20 0752  GLUCAP 83 104* 113*    Meds Scheduled Meds: . aspirin  81 mg Per Tube Daily  . atorvastatin  10 mg Per Tube Daily  . bisacodyl  10 mg Oral Daily   Or  . bisacodyl  10 mg Rectal Daily  . chlorhexidine gluconate (MEDLINE KIT)  15 mL Mouth Rinse BID  . Chlorhexidine Gluconate Cloth  6 each Topical Daily  . clopidogrel  75 mg Per Tube Daily  . colchicine  0.3 mg Per Tube BID  . docusate  200 mg Per Tube Daily  . fentaNYL      . folic acid  1 mg Intravenous Daily  . guaiFENesin  15 mL Per Tube Q6H  . insulin aspart  0-24 Units Subcutaneous Q4H  . ipratropium-albuterol  3 mL  Nebulization Q4H  . methylPREDNISolone (SOLU-MEDROL) injection  40 mg Intravenous Q24H  . metoprolol tartrate  5 mg Intravenous Q6H  . midazolam      . mometasone-formoterol  2 puff Inhalation BID  . multivitamin  15 mL Oral Daily  . pantoprazole (PROTONIX) IV  40 mg Intravenous Q24H  . polyethylene glycol  17 g Per Tube Daily  . sodium chloride flush  3 mL Intravenous Q12H  . thiamine  100 mg Intravenous Daily   Continuous Infusions: . sodium chloride Stopped (02/07/20 1415)  . sodium chloride    . sodium chloride    . amiodarone 30 mg/hr (02/12/20 0206)  . ampicillin-sulbactam (UNASYN) IV 3 g (02/12/20 0434)  . lactated ringers    . lactated ringers 50 mL/hr at 02/11/20 2014  . lactated ringers 20 mL/hr at 02/08/20 1831  . phenylephrine (NEO-SYNEPHRINE) Adult infusion Stopped (02/11/20 1258)  . banana bag IV 1000 mL     PRN Meds:.sodium chloride, haloperidol lactate, lactated ringers, levalbuterol, LORazepam, metoprolol tartrate, ondansetron (ZOFRAN) IV, oxyCODONE, sodium  chloride flush  Xrays DG Abd 1 View  Result Date: 02/10/2020 CLINICAL DATA:  Orogastric tube placement EXAM: ABDOMEN - 1 VIEW COMPARISON:  Portable exam 1711 hours without priors for comparison FINDINGS: Tip of orogastric tube projects over stomach though the proximal side-port is likely at/just above the gastroesophageal junction, recommend advancing tube 4 cm. Epicardial pacing wires, mediastinal drain and BILATERAL thoracostomy tubes present. Bowel gas pattern normal. Overpenetrated lung bases. IMPRESSION: Recommend advancing orogastric tube 4 cm. Electronically Signed   By: Lavonia Dana M.D.   On: 02/10/2020 17:47   DG Chest Port 1 View  Result Date: 02/11/2020 CLINICAL DATA:  Hx of cabg EXAM: PORTABLE CHEST - 1 VIEW COMPARISON:  the previous day's study FINDINGS: Endotracheal tube, gastric tube, right IJ venous sheath, bilateral chest tubes remain in place. No pneumothorax. Lungs hyperinflated. Bullous disease at the right lung apex. Focal airspace disease in right upper lobe as before. Patchy infiltrate or atelectasis at the left lung base partially obscuring the heart border. Heart size and mediastinal contours are within normal limits. Sternotomy wires and midline skin staples. No effusion. IMPRESSION: Persistent right upper lobe pneumonia. Electronically Signed   By: Lucrezia Europe M.D.   On: 02/11/2020 08:56   DG CHEST PORT 1 VIEW  Result Date: 02/10/2020 CLINICAL DATA:  Intubation EXAM: PORTABLE CHEST 1 VIEW COMPARISON:  Portable exam 1701 hours compared to 0521 hours FINDINGS: Carina poorly localized; tip of endotracheal tube is above the aortic arch in satisfactory position above carina. Tip of RIGHT jugular line projects over confluence of SVC. Nasogastric tube extends into stomach. Normal heart size post median sternotomy. Mediastinal drain and BILATERAL thoracostomy tubes identified. Atherosclerotic calcification aortic arch. Emphysematous changes with bullous disease of the RIGHT upper lobe.  Tiny RIGHT pneumothorax at upper chest. Infiltrates identified in the RIGHT upper and RIGHT lower lobes and questionably LEFT base. No LEFT pneumothorax identified. IMPRESSION: Changes of COPD with bullous disease in RIGHT upper lobe and tiny RIGHT pneumothorax. Airspace consolidation RIGHT upper and RIGHT lower lobes, likely LEFT lower lobe as well question multifocal pneumonia versus aspiration. Electronically Signed   By: Lavonia Dana M.D.   On: 02/10/2020 17:45   DG Abd Portable 1V  Result Date: 02/10/2020 CLINICAL DATA:  Advanced nasogastric tube EXAM: PORTABLE ABDOMEN - 1 VIEW COMPARISON:  Portable exam 2028 hours compared to 17/11 hours FINDINGS: Nasogastric tube is been advanced and proximal side-port now projects over stomach. Chest  tubes and epicardial pacing wires again seen. Gas-filled splenic flexure colon. IMPRESSION: Nasogastric tube now projects over stomach. Electronically Signed   By: Lavonia Dana M.D.   On: 02/10/2020 20:34   [place] EKG SITE RITE  Result Date: 02/11/2020 If Site Rite image not attached, placement could not be confirmed due to current cardiac rhythm.  [place] EKG SITE RITE  Result Date: 02/10/2020 If Site Rite image not attached, placement could not be confirmed due to current cardiac rhythm.  Results for orders placed or performed during the hospital encounter of 02/06/20  Culture, bal-quantitative     Status: Abnormal (Preliminary result)   Collection Time: 02/10/20  2:54 PM   Specimen: Bronchoalveolar Lavage; Respiratory  Result Value Ref Range Status   Specimen Description BRONCHIAL ALVEOLAR LAVAGE  Final   Special Requests Normal  Final   Gram Stain   Final    FEW WBC PRESENT, PREDOMINANTLY PMN MODERATE YEAST Performed at [facility], [address] [place], [place] [address]    Culture 80,000 COLONIES/mL YEAST (A)  Final   Report Status PENDING  Incomplete   Assessment/Plan: S/P Procedure(s) (LRB): CORONARY ARTERY BYPASS GRAFTING (CABG),  x Four with  Bilateral IMAs, and right leg greater saphenous vein (N/A) TRANSESOPHAGEAL ECHOCARDIOGRAM (TEE) (N/A) INDOCYANINE GREEN FLUORESCENCE IMAGING (ICG) (N/A) CAROTID ENDARTERECTOMY LEFT with  Patch Angioplasty Using Hemashield Platinum Finesse patch (Left)  1 resp failure- now on PRVC, PCCM assisting with management. Nebs also ordered 2 sinus rhythm with PVC's, + Hypertensive, off neo, conts amio gtt. May need additional agent to assist with BP control, defer to PCCM as they are managing  3 renal fxn ok, BUN 31/creat 1.16- some increase in vasc congestion, may need some diuretic. Getting Echo this am  4 conts current abx for RUL pneumonia(Unasyn), may need to consider RX Yeast from BAL Cx 5 anemia is pretty stable 6 no leukocytosis or fevers 7 thrombocytopenia trend is improving 8 mof CT drainage, + small air leak- keep tube in place 9 BS adeq control 10 coretrak requested 11 ETOH withdrawel management  LOS: 6 days    John Giovanni PA-C Pager [phone] 02/12/2020

## 2020-02-12 NOTE — Progress Notes (Signed)
TCTS BRIEF SICU PROGRESS NOTE  6 Days Post-Op  S/P Procedure(s) (LRB): CORONARY ARTERY BYPASS GRAFTING (CABG),  x Four with Bilateral IMAs, and right leg greater saphenous vein (N/A) TRANSESOPHAGEAL ECHOCARDIOGRAM (TEE) (N/A) INDOCYANINE GREEN FLUORESCENCE IMAGING (ICG) (N/A) CAROTID ENDARTERECTOMY LEFT with  Patch Angioplasty Using Hemashield Platinum Finesse patch (Left)   Reintubated this morning Currently NSR/AAI paced w/ stable BP  O2 sats w/ ABG 7.45/41/51/30 on 100% FiO2 UOP adequate  Plan: Continue current plan  Purcell Nails, MD 02/12/2020 5:16 PM

## 2020-02-12 NOTE — Progress Notes (Signed)
Echocardiogram 2D Echocardiogram limited has been performed.  Leta Jungling M 02/12/2020, 8:49 AM

## 2020-02-12 NOTE — Procedures (Signed)
Cortrak  Person Inserting Tube:  Felix Pacini E, RD Tube Type:  Cortrak - 43 inches Tube Location:  Left nare Initial Placement:  Stomach Secured by: Bridle Technique Used to Measure Tube Placement:  Documented cm marking at nare/ corner of mouth Cortrak Secured At:  75 cm    Cortrak Tube Team Note:  Consult received to place a Cortrak feeding tube.   No x-ray is required. RN may begin using tube.   If the tube becomes dislodged please keep the tube and contact the Cortrak team at [email or web address] (password [email or web address]) for replacement.  If after hours and replacement cannot be delayed, place a NG tube and confirm placement with an abdominal x-ray.   Felix Pacini, MS, RD, LDN Pager number available on Amion

## 2020-02-12 NOTE — Progress Notes (Signed)
NAME:  Tony Munoz, MRN:  [id number], DOB:  05/20/1956, LOS: 6 ADMISSION DATE:  02/06/2020, CONSULTATION DATE:  02/08/20 REFERRING MD:  Orvan Seen - CVTS, CHIEF COMPLAINT:  SOB, hypoxia   Brief History   64 yo M with CAD, HTN, COPD admit for elective CABG and L CEA, with progressive respiratory failure with hypoxia and hypercarbia and intubated on POD 3  History of present illness   64 yo M with PMH CAD, HTN, who presented for CABG and  L CEA after workup for DOE (via stress test and LHC) demonstrated multivessel CAD. Pt had 80 % L ICA stenosis with contralateral occlusion. Pre-op eval reveals significant tobacco abuse, smoking 4 ppd for length of time, but recently with decrease to approx 1 pdd.   POD 2 (6/3) pt experienced progressive AMS and hypoxia. He was given morphine and ativan earlier in shift for tx of post-op pain and possible EtOH dts. Amidst pt hypoxic decompensation, pt received 74m Lasix empirically, with concern for possible flash pulmonary edema.  PCCM was asked to consult for progressive hypoxia. ABG at time of consult reveals hypoxic and hypercarbic respiratory failure.   CXR is pending at time of exam   Past Medical History  EtOH use  CAD  Anxiety Tobacco use disorder HTN   Significant Hospital Events   6/1 CABG and L CEA  6/2 receiving BZD for possible EtOH withdrawals. Was on precedex  6/3 worsening respiratory status with worsening hypoxia. Received 40 mg lasix. PCCM consulted 6/5 intubated and bronch with removal of aspirated pill on bronchus intermedius 6/6 Self extubated  6/7 Worsening hypoxia with respiratory distress and WOB. Reintubated  Consults:  Vascular surgery PCCM   Procedures:  6/1 CABG 6/1 L CEA   Significant Diagnostic Tests:  Bronch 6/5: mucus plugs bilaterally, blue pill in BI    Micro Data:  6/5 BAL- few WBC, moderate yeast  Antimicrobials:  Cefuroxime 6/1 (surg ppx)  vanc 6/1  Unasyn 6/4>>  Interim history/subjective:    Called to the bedside for increased respiratory distress.  Patient awake but poorly responsive.  ABG reviewed Reintubated  Objective   Blood pressure (!) 143/100, pulse 95, temperature 97.8 F (36.6 C), temperature source Oral, resp. rate 19, height _0  (1.753 m), weight 57.1 kg, SpO2 100 %.        Intake/Output Summary (Last 24 hours) at 02/12/2020 0810 Last data filed at 02/12/2020 0600 Gross per 24 hour  Intake 1313.19 ml  Output 1564 ml  Net -250.81 ml   Filed Weights   02/10/20 0500 02/11/20 0500 02/12/20 0600  Weight: 58.6 kg 58.5 kg 57.1 kg    Examination: Gen:      Chronically ill-appearing, cachectic, HEENT:  EOMI, sclera anicteric Neck:     No masses; no thyromegaly, ET tube Lungs:    Clear to auscultation bilaterally; normal respiratory effort CV:         Regular rate and rhythm; no murmurs Abd:      + bowel sounds; soft, non-tender; no palpable masses, no distension Ext:    No edema; adequate peripheral perfusion Skin:      Sternotomy and left carotid endarterectomy scars clean with no discharge Neuro: Sedated  Labs significant for sodium 142, BUN/creatinine 31/1.16, WBC 9.6, hemoglobin 10.1 Chest x-ray 6/7-worsening right lung infiltrates  Resolved Hospital Problem list     Assessment & Plan:   Acute metabolic encephalopathy -In the setting of hypoxia, hypercarbia, possible medication related in setting of CIWA protocol meds and  post-op pain meds -EtOH use disorder with possible EtOH withdrawals  P: Sedation while on the ventilator Continue thiamine, folate  Acute hypoxic and hypercarbic respiratory failure Aspiration-right lower lobe foreign body found during bronchoscopy Hx tobacco use HAP -Possible COPD exacerbation, possible component of post-op atelectasis. Wheezing + crackles on exam, likely element of pulm edema -CXR with continued right lower opacity concerning for pneumonia, WBC improved with no fever P Check x-ray, ABG post  intubation Continue bronchodilators, Unasyn Likely has recurrent aspiration.  May end up needing tracheostomy.  CABG 6/1 L CEA 6/1 P: -Continue management per to CTS -Continue aspirin and Plavix  H/o HTN P: -When sedation wears off may be able to resume antihypertensives.  Afib P: -Continue amiodarone -Continue telemetry monitoring.  Thrombocytopenia -Holding DVT prophylaxis and anticoagulation -Continue to monitor  Cachexia, frailty P: -will resume TF once enteral access obtained  Best practice:  Diet: NPO  Pain/Anxiety/Delirium protocol (if indicated): PRN per primary  VAP protocol (if indicated): na DVT prophylaxis: per primary, post op  GI prophylaxis: protonix  Glucose control: SSI  Mobility: BR Code Status: Full  Family Communication: Per primary team. Disposition: ICU   Critical care time:   The patient is critically ill with multiple organ system failure and requires high complexity decision making for assessment and support, frequent evaluation and titration of therapies, advanced monitoring, review of radiographic studies and interpretation of complex data.   Critical Care Time devoted to patient care services, exclusive of separately billable procedures, described in this note is 35 minutes.   Marshell Garfinkel MD [facility] Please see [email or web address] for pager details.  02/12/2020, 8:21 AM

## 2020-02-12 NOTE — Progress Notes (Signed)
Upon assessing patient, the linen was wet with urine. Unable to irrigate foley and seal broke at this time. Foley replaced with brisk return of of dark tea colored urine.

## 2020-02-12 NOTE — Procedures (Signed)
Intubation Procedure Note Tony Munoz [id number] 1956-05-27  Procedure: Intubation Indications: Respiratory insufficiency  Procedure Details Consent: Unable to obtain consent because of emergent medical necessity. Time Out: Verified patient identification, verified procedure, site/side was marked, verified correct patient position, special equipment/implants available, medications/allergies/relevent history reviewed, required imaging and test results available.  Performed  Maximum sterile technique was used including gloves, hand hygiene and mask.  MAC blade size 4 Grade 1 view.  1 attempt  Placement verified by CO2 colorimetric indicator, condensation on the tube and auscultation of breath sounds  Evaluation Hemodynamic Status: BP stable throughout; O2 sats: stable throughout Patient's Current Condition: stable Complications: No apparent complications Patient did tolerate procedure well. Chest X-ray ordered to verify placement.  CXR: pending.  Marshell Garfinkel MD [facility] Please see [email or web address] for pager details.  02/12/2020, 8:09 AM

## 2020-02-12 NOTE — Progress Notes (Signed)
eLink Physician-Brief Progress Note Patient Name: DEMETRUIS DEPAUL DOB: August 14, 1956 MRN: [id number]   Date of Service  02/12/2020  HPI/Events of Note  Agitation - Request to renew bilateral soft wrist restraints.   eICU Interventions  Plan: 1. Will renew bilateral soft wrist restraints X 11 hours.      Intervention Category Major Interventions: Delirium, psychosis, severe agitation - evaluation and management  Divine Hansley Eugene 02/12/2020, 9:23 PM

## 2020-02-12 NOTE — Progress Notes (Signed)
PT Cancellation Note  Patient Details Name: Tony Munoz MRN: [id number] DOB: October 29, 1955   Cancelled Treatment:    Reason Eval/Treat Not Completed: Medical issues which prohibited therapy Pt re-intubated this AM. Holding PT. Will follow.    Blake Divine A Braylei Totino 02/12/2020, 8:07 AM [place], PT, DPT Acute Rehabilitation Services Pager [phone] Office [phone]

## 2020-02-13 ENCOUNTER — Inpatient Hospital Stay (HOSPITAL_COMMUNITY): Payer: Medicare HMO

## 2020-02-13 DIAGNOSIS — J9600 Acute respiratory failure, unspecified whether with hypoxia or hypercapnia: Secondary | ICD-10-CM

## 2020-02-13 LAB — GLUCOSE, CAPILLARY
Glucose-Capillary: 112 mg/dL — ABNORMAL HIGH (ref 70–99)
Glucose-Capillary: 121 mg/dL — ABNORMAL HIGH (ref 70–99)
Glucose-Capillary: 126 mg/dL — ABNORMAL HIGH (ref 70–99)
Glucose-Capillary: 130 mg/dL — ABNORMAL HIGH (ref 70–99)
Glucose-Capillary: 143 mg/dL — ABNORMAL HIGH (ref 70–99)
Glucose-Capillary: 153 mg/dL — ABNORMAL HIGH (ref 70–99)
Glucose-Capillary: 159 mg/dL — ABNORMAL HIGH (ref 70–99)

## 2020-02-13 LAB — COMPREHENSIVE METABOLIC PANEL
ALT: 35 U/L (ref 0–44)
AST: 31 U/L (ref 15–41)
Albumin: 2.4 g/dL — ABNORMAL LOW (ref 3.5–5.0)
Alkaline Phosphatase: 40 U/L (ref 38–126)
Anion gap: 13 (ref 5–15)
BUN: 23 mg/dL (ref 8–23)
CO2: 27 mmol/L (ref 22–32)
Calcium: 7.6 mg/dL — ABNORMAL LOW (ref 8.9–10.3)
Chloride: 103 mmol/L (ref 98–111)
Creatinine, Ser: 0.94 mg/dL (ref 0.61–1.24)
GFR calc Af Amer: >60 mL/min (ref >60)
GFR calc non Af Amer: >60 mL/min (ref >60)
Glucose, Bld: 166 mg/dL — ABNORMAL HIGH (ref 70–99)
Potassium: 3.2 mmol/L — ABNORMAL LOW (ref 3.5–5.1)
Sodium: 143 mmol/L (ref 135–145)
Total Bilirubin: 1.1 mg/dL (ref 0.3–1.2)
Total Protein: 4.1 g/dL — ABNORMAL LOW (ref 6.5–8.1)

## 2020-02-13 LAB — CBC
HCT: 27.3 % — ABNORMAL LOW (ref 39.0–52.0)
Hemoglobin: 8.8 g/dL — ABNORMAL LOW (ref 13.0–17.0)
MCH: 31.4 pg (ref 26.0–34.0)
MCHC: 32.2 g/dL (ref 30.0–36.0)
MCV: 97.5 fL (ref 80.0–100.0)
Platelets: 75 10*3/uL — ABNORMAL LOW (ref 150–400)
RBC: 2.8 MIL/uL — ABNORMAL LOW (ref 4.22–5.81)
RDW: 14.4 % (ref 11.5–15.5)
WBC: 3.4 10*3/uL — ABNORMAL LOW (ref 4.0–10.5)
nRBC: 0 % (ref 0.0–0.2)

## 2020-02-13 LAB — PHOSPHORUS
Phosphorus: 2.2 mg/dL — ABNORMAL LOW (ref 2.5–4.6)
Phosphorus: 1.2 mg/dL — ABNORMAL LOW (ref 2.5–4.6)

## 2020-02-13 LAB — MAGNESIUM
Magnesium: 2.1 mg/dL (ref 1.7–2.4)
Magnesium: 1.8 mg/dL (ref 1.7–2.4)

## 2020-02-13 MED ORDER — FUROSEMIDE 10 MG/ML IJ SOLN
40.0000 mg | Freq: Two times a day (BID) | INTRAMUSCULAR | Status: DC
  Administered 2020-02-13 – 2020-02-19 (×13): 40 mg via INTRAVENOUS
  Filled 2020-02-13 (×15): qty 4

## 2020-02-13 MED ORDER — LEVALBUTEROL HCL 0.63 MG/3ML IN NEBU
0.6300 mg | INHALATION_SOLUTION | Freq: Three times a day (TID) | RESPIRATORY_TRACT | Status: AC
  Administered 2020-02-13 (×2): 0.63 mg via RESPIRATORY_TRACT
  Filled 2020-02-13 (×2): qty 3

## 2020-02-13 MED ORDER — COLCHICINE 0.6 MG PO TABS
0.6000 mg | ORAL_TABLET | Freq: Two times a day (BID) | ORAL | Status: DC
  Administered 2020-02-13 – 2020-02-19 (×14): 0.6 mg
  Filled 2020-02-13 (×14): qty 1

## 2020-02-13 MED ORDER — POTASSIUM CHLORIDE 10 MEQ/50ML IV SOLN
10.0000 meq | INTRAVENOUS | Status: AC
  Administered 2020-02-13 (×3): 10 meq via INTRAVENOUS
  Filled 2020-02-13 (×3): qty 50

## 2020-02-13 NOTE — Progress Notes (Signed)
CT surgery p.m. Rounds  Patient resting comfortably on ventilator O2 saturation 95% Feeding tube in place A pacing heart rate 80 stable blood pressure  Blood pressure 112/79, pulse 94, temperature (!) 96.6 F (35.9 C), temperature source Axillary, resp. rate 15, height 5\' 9"  (1.753 m), weight 58.4 kg, SpO2 100 %.

## 2020-02-13 NOTE — Progress Notes (Signed)
NAME:  Tony Munoz, MRN:  [id number], DOB:  21-Jun-1956, LOS: 7 ADMISSION DATE:  02/06/2020, CONSULTATION DATE:  02/08/20 REFERRING MD:  Orvan Seen - CVTS, CHIEF COMPLAINT:  SOB, hypoxia   Brief History   64 yo M with CAD, HTN, COPD admit for elective CABG and L CEA, with progressive respiratory failure with hypoxia and hypercarbia and intubated on POD 3. Self extubated 6/6 and required re-intubation 6/7  History of present illness   64 yo M with PMH CAD, HTN, who presented for CABG and  L CEA after workup for DOE (via stress test and LHC) demonstrated multivessel CAD. Pt had 80 % L ICA stenosis with contralateral occlusion. Pre-op eval reveals significant tobacco abuse, smoking 4 ppd for length of time, but recently with decrease to approx 1 pdd.   POD 2 (6/3) pt experienced progressive AMS and hypoxia. He was given morphine and ativan earlier in shift for tx of post-op pain and possible EtOH dts. Amidst pt hypoxic decompensation, pt received 67m Lasix empirically, with concern for possible flash pulmonary edema.  PCCM was asked to consult for progressive hypoxia. ABG at time of consult reveals hypoxic and hypercarbic respiratory failure.   CXR is pending at time of exam   Past Medical History  EtOH use  CAD  Anxiety Tobacco use disorder HTN   Significant Hospital Events   6/1 CABG and L CEA  6/2 receiving BZD for possible EtOH withdrawals. Was on precedex  6/3 worsening respiratory status with worsening hypoxia. Received 40 mg lasix. PCCM consulted 6/5 intubated and bronch with removal of aspirated pill on bronchus intermedius 6/6 Self extubated  6/7 Worsening hypoxia with respiratory distress and WOB. Reintubated  Consults:  Vascular surgery PCCM   Procedures:  6/1 CABG 6/1 L CEA   Significant Diagnostic Tests:  Bronch 6/5: mucus plugs bilaterally, blue pill in BI    Micro Data:  6/5 BAL- few WBC, moderate yeast  Antimicrobials:  Cefuroxime 6/1 (surg ppx)  vanc  6/1 Unasyn 6/4>>  Interim history/subjective:  Weaning this AM on 12/8. TCTS feels will require trach / PEG.  Objective   Blood pressure 119/85, pulse 89, temperature (!) 96.5 F (35.8 C), temperature source Axillary, resp. rate (!) 36, height _0  (1.753 m), weight 58.4 kg, SpO2 100 %.    Vent Mode: PSV;CPAP FiO2 (%):  [50 %-100 %] 50 % Set Rate:  [18 bmp] 18 bmp Vt Set:  [570 mL] 570 mL PEEP:  [8 cmH20] 8 cmH20 Pressure Support:  [12 cmH20] 12 cmH20 Plateau Pressure:  [17 cmH20-25 cmH20] 18 cmH20   Intake/Output Summary (Last 24 hours) at 02/13/2020 0854 Last data filed at 02/13/2020 0700 Gross per 24 hour  Intake 1696.08 ml  Output 1515 ml  Net 181.08 ml   Filed Weights   02/11/20 0500 02/12/20 0600 02/13/20 0600  Weight: 58.5 kg 57.1 kg 58.4 kg    Examination: General: Adult male, resting in bed, in NAD. Neuro: Sedated but opens eyes to voice, follows intermittent commands. HEENT: [place]/AT. Sclerae anicteric. EOMI. Cardiovascular: RRR, no M/R/G.  Right chest tube with air leak, minimal output. Lungs: Respirations even and unlabored.  CTA bilaterally, No W/R/R.  Abdomen: BS x 4, soft, NT/ND.  Musculoskeletal: No gross deformities, no edema.  Skin: Intact, warm, no rashes.  Assessment & Plan:   Acute metabolic encephalopathy -In the setting of hypoxia, hypercarbia, possible medication related in setting of CIWA protocol meds and post-op pain meds -EtOH use disorder with possible EtOH withdrawals  P: Sedation while on the ventilator Continue thiamine, folate  Acute hypoxic and hypercarbic respiratory failure Aspiration-right lower lobe foreign body found during bronchoscopy Hx tobacco use HAP -Possible COPD exacerbation, possible component of post-op atelectasis. Wheezing + crackles on exam, likely element of pulm edema -CXR with continued right lower opacity concerning for pneumonia, WBC improved with no fever P Continue vent support, currently on PSV at 12/8.   Continue to wean as able Continue bronchodilators, Unasyn Likely has recurrent aspiration.  May end up needing tracheostomy.  CABG 6/1 L CEA 6/1 P: -Continue management per to TCTS -Continue aspirin and Plavix  H/o HTN P: -When sedation wears off may be able to resume antihypertensives.  Afib P: -Continue amiodarone -Continue telemetry monitoring.  Thrombocytopenia -Holding DVT prophylaxis and anticoagulation -Continue to monitor  Cachexia, frailty P: - Continue TF's  Best practice:  Diet: NPO  Pain/Anxiety/Delirium protocol (if indicated): Precedex gtt VAP protocol (if indicated): In place DVT prophylaxis: per primary, post op  GI prophylaxis: protonix  Glucose control: SSI  Mobility: BR Code Status: Full  Family Communication: Per primary team. Disposition: ICU   Critical care time: 35 min.    Montey Hora, [facility] 02/13/2020, 9:11 AM

## 2020-02-13 NOTE — Progress Notes (Signed)
7 Days Post-Op Procedure(s) (LRB): CORONARY ARTERY BYPASS GRAFTING (CABG),  x Four with Bilateral IMAs, and right leg greater saphenous vein (N/A) TRANSESOPHAGEAL ECHOCARDIOGRAM (TEE) (N/A) INDOCYANINE GREEN FLUORESCENCE IMAGING (ICG) (N/A) CAROTID ENDARTERECTOMY LEFT with  Patch Angioplasty Using Hemashield Platinum Finesse patch (Left) Subjective: Intubated/sedated  Objective: Vital signs in last 24 hours: Temp:  [97.7 F (36.5 C)-98 F (36.7 C)] 98 F (36.7 C) (06/08 0400) Pulse Rate:  [87-109] 89 (06/08 0600) Cardiac Rhythm: Normal sinus rhythm (06/08 0400) Resp:  [12-40] 36 (06/08 0700) BP: (78-188)/(34-127) 119/85 (06/08 0700) SpO2:  [93 %-100 %] 100 % (06/08 0600) FiO2 (%):  [50 %-100 %] 50 % (06/08 0400) Weight:  [58.4 kg] 58.4 kg (06/08 0600)  Hemodynamic parameters for last 24 hours:    Intake/Output from previous day: 06/07 0701 - 06/08 0700 In: 2012.9 [I.V.:962.3; NG/GT:427.1; IV Piggyback:623.5] Out: 8315 [VVOHY:0737; Chest Tube:540] Intake/Output this shift: No intake/output data recorded.  General appearance: alert and cooperative Neurologic: unable to fully assess Heart: regular rate and rhythm, S1, S2 normal, no murmur, click, rub or gallop Lungs: rales bilaterally Abdomen: soft, non-tender; bowel sounds normal; no masses,  no organomegaly Extremities: extremities normal, atraumatic, no cyanosis or edema Wound: c/d/i  Lab Results: Recent Labs    02/12/20 0426 02/12/20 0457 02/12/20 1253 02/13/20 0357  WBC 9.6  --   --  3.4*  HGB 10.1*   < > 8.8* 8.8*  HCT 31.8*   < > 26.0* 27.3*  PLT 106*  --   --  75*   < > = values in this interval not displayed.   BMET:  Recent Labs    02/12/20 0426 02/12/20 0457 02/12/20 1253 02/13/20 0357  NA 142   < > 143 143  K 4.1   < > 4.0 3.2*  CL 104  --   --  103  CO2 29  --   --  27  GLUCOSE 107*  --   --  166*  BUN 31*  --   --  23  CREATININE 1.16  --   --  0.94  CALCIUM 8.0*  --   --  7.6*   < > =  values in this interval not displayed.    PT/INR: No results for input(s): LABPROT, INR in the last 72 hours. ABG    Component Value Date/Time   PHART 7.456 (H) 02/12/2020 1253   HCO3 29.0 (H) 02/12/2020 1253   TCO2 30 02/12/2020 1253   ACIDBASEDEF 3.0 (H) 02/06/2020 2222   O2SAT 88.0 02/12/2020 1253   CBG (last 3)  Recent Labs    02/12/20 2005 02/12/20 2354 02/13/20 0355  GLUCAP 146* 162* 153*    Assessment/Plan: S/P Procedure(s) (LRB): CORONARY ARTERY BYPASS GRAFTING (CABG),  x Four with Bilateral IMAs, and right leg greater saphenous vein (N/A) TRANSESOPHAGEAL ECHOCARDIOGRAM (TEE) (N/A) INDOCYANINE GREEN FLUORESCENCE IMAGING (ICG) (N/A) CAROTID ENDARTERECTOMY LEFT with  Patch Angioplasty Using Hemashield Platinum Finesse patch (Left) consider trach/peg in next 24-48 hours   LOS: 7 days    Tony Munoz 02/13/2020
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
## 2020-02-13 NOTE — Progress Notes (Signed)
RT note- Patient continues to wean well, sat on side of the bed with PT, tolerated well. Continue to monitor.

## 2020-02-14 ENCOUNTER — Inpatient Hospital Stay (HOSPITAL_COMMUNITY): Payer: Medicare HMO

## 2020-02-14 DIAGNOSIS — R0902 Hypoxemia: Secondary | ICD-10-CM

## 2020-02-14 DIAGNOSIS — L8990 Pressure ulcer of unspecified site, unspecified stage: Secondary | ICD-10-CM | POA: Insufficient documentation

## 2020-02-14 LAB — GLUCOSE, CAPILLARY
Glucose-Capillary: 134 mg/dL — ABNORMAL HIGH (ref 70–99)
Glucose-Capillary: 136 mg/dL — ABNORMAL HIGH (ref 70–99)
Glucose-Capillary: 122 mg/dL — ABNORMAL HIGH (ref 70–99)
Glucose-Capillary: 98 mg/dL (ref 70–99)
Glucose-Capillary: 96 mg/dL (ref 70–99)

## 2020-02-14 LAB — COMPREHENSIVE METABOLIC PANEL
ALT: 51 U/L — ABNORMAL HIGH (ref 0–44)
AST: 36 U/L (ref 15–41)
Albumin: 2.2 g/dL — ABNORMAL LOW (ref 3.5–5.0)
Alkaline Phosphatase: 47 U/L (ref 38–126)
Anion gap: 6 (ref 5–15)
BUN: 20 mg/dL (ref 8–23)
CO2: 32 mmol/L (ref 22–32)
Calcium: 7.7 mg/dL — ABNORMAL LOW (ref 8.9–10.3)
Chloride: 103 mmol/L (ref 98–111)
Creatinine, Ser: 0.89 mg/dL (ref 0.61–1.24)
GFR calc Af Amer: >60 mL/min (ref >60)
GFR calc non Af Amer: >60 mL/min (ref >60)
Glucose, Bld: 123 mg/dL — ABNORMAL HIGH (ref 70–99)
Potassium: 3.3 mmol/L — ABNORMAL LOW (ref 3.5–5.1)
Sodium: 141 mmol/L (ref 135–145)
Total Bilirubin: 1.1 mg/dL (ref 0.3–1.2)
Total Protein: 3.9 g/dL — ABNORMAL LOW (ref 6.5–8.1)

## 2020-02-14 LAB — CBC
HCT: 26.9 % — ABNORMAL LOW (ref 39.0–52.0)
Hemoglobin: 8.8 g/dL — ABNORMAL LOW (ref 13.0–17.0)
MCH: 31.4 pg (ref 26.0–34.0)
MCHC: 32.7 g/dL (ref 30.0–36.0)
MCV: 96.1 fL (ref 80.0–100.0)
Platelets: 77 10*3/uL — ABNORMAL LOW (ref 150–400)
RBC: 2.8 MIL/uL — ABNORMAL LOW (ref 4.22–5.81)
RDW: 14.2 % (ref 11.5–15.5)
WBC: 4.6 10*3/uL (ref 4.0–10.5)
nRBC: 0 % (ref 0.0–0.2)

## 2020-02-14 MED ORDER — POTASSIUM PHOSPHATES 15 MMOLE/5ML IV SOLN
30.0000 mmol | Freq: Once | INTRAVENOUS | Status: AC
  Administered 2020-02-14: 30 mmol via INTRAVENOUS
  Filled 2020-02-14: qty 10

## 2020-02-14 MED ORDER — ALBUMIN HUMAN 5 % IV SOLN
INTRAVENOUS | Status: AC
  Administered 2020-02-14: 12.5 g
  Filled 2020-02-14: qty 250

## 2020-02-14 NOTE — Progress Notes (Signed)
SLP Cancellation Note  Patient Details Name: Tony Munoz MRN: [id number] DOB: 03-15-1956   Cancelled treatment:       Reason Eval/Treat Not Completed: Medical issues which prohibited therapy(Pt remains intubated. SLP will follow up. )  Kitty Cadavid I. Vear Clock, MS, CCC-SLP Acute Rehabilitation Services Office number [phone] Pager [phone]  [name] 02/14/2020, 9:24 AM

## 2020-02-14 NOTE — Progress Notes (Signed)
PT Cancellation Note  Patient Details Name: Tony Munoz MRN: [id number] DOB: [birth year]   Cancelled Treatment:    Reason Eval/Treat Not Completed: Patient not medically ready. Pt back on full vent support, hypotensive, not appropriate for PT treatment session. Will check back.  Ina Homes, PT, DPT Acute Rehabilitation Services  Pager [phone] Office [phone]  Malachy Chamber 02/14/2020, 2:25 PM

## 2020-02-14 NOTE — Progress Notes (Signed)
NAME:  Tony Munoz, MRN:  [id number], DOB:  03-21-1956, LOS: 36 ADMISSION DATE:  02/06/2020, CONSULTATION DATE:  02/08/20 REFERRING MD:  Orvan Seen - CVTS, CHIEF COMPLAINT:  SOB, hypoxia   Brief History   64 yo M with CAD, HTN, COPD admit for elective CABG and L CEA, with progressive respiratory failure with hypoxia and hypercarbia and intubated on POD 3. Self extubated 6/6 and required re-intubation 6/7  History of present illness   64 yo M with PMH CAD, HTN, who presented for CABG and  L CEA after workup for DOE (via stress test and LHC) demonstrated multivessel CAD. Pt had 80 % L ICA stenosis with contralateral occlusion. Pre-op eval reveals significant tobacco abuse, smoking 4 ppd for length of time, but recently with decrease to approx 1 pdd.   POD 2 (6/3) pt experienced progressive AMS and hypoxia. He was given morphine and ativan earlier in shift for tx of post-op pain and possible EtOH dts. Amidst pt hypoxic decompensation, pt received 79m Lasix empirically, with concern for possible flash pulmonary edema.  PCCM was asked to consult for progressive hypoxia. ABG at time of consult reveals hypoxic and hypercarbic respiratory failure.   CXR is pending at time of exam   Past Medical History  EtOH use  CAD  Anxiety Tobacco use disorder HTN   Significant Hospital Events   6/1 CABG and L CEA  6/2 receiving BZD for possible EtOH withdrawals. Was on precedex  6/3 worsening respiratory status with worsening hypoxia. Received 40 mg lasix. PCCM consulted 6/5 intubated and bronch with removal of aspirated pill on bronchus intermedius 6/6 Self extubated  6/7 Worsening hypoxia with respiratory distress and WOB. Reintubated  Consults:  Vascular surgery PCCM   Procedures:  6/1 CABG 6/1 L CEA   Significant Diagnostic Tests:  Bronch 6/5: mucus plugs bilaterally, blue pill in BI    Micro Data:  6/5 BAL- few WBC, moderate yeast  Antimicrobials:  Cefuroxime 6/1 (surg ppx)  vanc  6/1 Unasyn 6/4>>  Interim history/subjective:  Weaning well though having lots of thin secretions that are easily suctioned. More awake compared to yesterday.  Objective   Blood pressure (!) 87/62, pulse 92, temperature 98.1 F (36.7 C), temperature source Oral, resp. rate 18, height _0  (1.753 m), weight 58.4 kg, SpO2 100 %.    Vent Mode: PRVC FiO2 (%):  [40 %-50 %] 40 % Set Rate:  [18 bmp] 18 bmp Vt Set:  [570 mL] 570 mL PEEP:  [8 cmH20] 8 cmH20 Pressure Support:  [12 cmH20] 12 cmH20 Plateau Pressure:  [16 cmH20-18 cmH20] 18 cmH20   Intake/Output Summary (Last 24 hours) at 02/14/2020 0837 Last data filed at 02/14/2020 0800 Gross per 24 hour  Intake 1628.8 ml  Output 2800 ml  Net -1171.2 ml   Filed Weights   02/11/20 0500 02/12/20 0600 02/13/20 0600  Weight: 58.5 kg 57.1 kg 58.4 kg    Examination: General: Adult male, resting in bed, in NAD. Neuro: Awake on vent, follows intermittent commands. HEENT: [place]/AT. Sclerae anicteric. EOMI. ETT in place. Cardiovascular: RRR, no M/R/G.  Right chest tube with air leak, minimal output. Lungs: Respirations even and unlabored.  CTA bilaterally, No W/R/R.  Abdomen: BS x 4, soft, NT/ND.  Musculoskeletal: No gross deformities, no edema.  Skin: Intact, warm, no rashes.  Assessment & Plan:   Acute metabolic encephalopathy - In the setting of hypoxia, hypercarbia, possible medication related in setting of CIWA protocol meds and post-op pain meds - EtOH  use disorder with possible EtOH withdrawals  P: Continue precedex. Continue thiamine, folate.  Acute hypoxic and hypercarbic respiratory failure Aspiration-right lower lobe foreign body found during bronchoscopy Hx tobacco use HAP -Possible COPD exacerbation, possible component of post-op atelectasis. Wheezing + crackles on exam, likely element of pulm edema -CXR with continued right lower opacity concerning for pneumonia, WBC improved with no fever. P: Continue vent support with  daily weaning. Might need tracheostomy but hopefully can avoid once secretions improve. Continue chest PT, frequent suctioning. Continue bronchodilators, Unasyn.  CABG 6/1 L CEA 6/1 P: Continue management per to TCTS. Continue aspirin and Plavix.  H/o HTN P: When sedation wears off may be able to resume antihypertensives.  Afib P: Continue amiodarone. Continue telemetry monitoring.  Thrombocytopenia P: Holding DVT prophylaxis and anticoagulation. Continue to monitor.  Cachexia, frailty P: Continue TF's.  Best practice:  Diet: NPO  Pain/Anxiety/Delirium protocol (if indicated): Precedex gtt VAP protocol (if indicated): In place DVT prophylaxis: per primary, post op  GI prophylaxis: protonix  Glucose control: SSI  Mobility: BR Code Status: Full  Family Communication: Per primary team. Disposition: ICU   Critical care time: 30 min.    Montey Hora, [facility] 02/14/2020, 8:37 AM

## 2020-02-14 NOTE — Progress Notes (Signed)
eLink Physician-Brief Progress Note Patient Name: Tony Munoz DOB: 26-Jun-1956 MRN: [id number]   Date of Service  02/14/2020  HPI/Events of Note  Agitation - Request for bilateral soft wrist restraints.   eICU Interventions  Plan: 1. Bilateral soft wrist restraints X 6 hours.      Intervention Category Major Interventions: Delirium, psychosis, severe agitation - evaluation and management  Zoelle Markus Eugene 02/14/2020, 2:22 AM

## 2020-02-14 NOTE — Progress Notes (Addendum)
[address]       [place] [address]             [phone]      8 Days Post-Op Procedure(s) (LRB): CORONARY ARTERY BYPASS GRAFTING (CABG),  x Four with Bilateral IMAs, and right leg greater saphenous vein (N/A) TRANSESOPHAGEAL ECHOCARDIOGRAM (TEE) (N/A) INDOCYANINE GREEN FLUORESCENCE IMAGING (ICG) (N/A) CAROTID ENDARTERECTOMY LEFT with  Patch Angioplasty Using Hemashield Platinum Finesse patch (Left) Subjective: Remains on vent- may require PEG/Trach  Objective: Vital signs in last 24 hours: Temp:  [96.6 F (35.9 C)-98.2 F (36.8 C)] 98.1 F (36.7 C) (06/09 0801) Pulse Rate:  [65-141] 92 (06/09 0800) Cardiac Rhythm: Atrial paced (06/09 0800) Resp:  [0-35] 18 (06/09 0800) BP: (75-129)/(54-79) 87/62 (06/09 0800) SpO2:  [80 %-100 %] 100 % (06/09 0800) FiO2 (%):  [40 %-50 %] 40 % (06/09 0407)  Hemodynamic parameters for last 24 hours:    Intake/Output from previous day: 06/08 0701 - 06/09 0700 In: 1582.8 [I.V.:791.6; NG/GT:400; IV Piggyback:391.2] Out: 2800 [Urine:2650; Chest Tube:150] Intake/Output this shift: Total I/O In: 118.7 [I.V.:78.7; NG/GT:40] Out: -   General appearance: alert, cooperative, fatigued and no distress Heart: regular rate and rhythm and paced Lungs: dim in lower fields Abdomen: benign Extremities: no edema Wound: incis with echymosis. some serosang drainage  Lab Results: Recent Labs    02/13/20 0357 02/14/20 0500  WBC 3.4* 4.6  HGB 8.8* 8.8*  HCT 27.3* 26.9*  PLT 75* 77*   BMET:  Recent Labs    02/13/20 0357 02/14/20 0500  NA 143 141  K 3.2* 3.3*  CL 103 103  CO2 27 32  GLUCOSE 166* 123*  BUN 23 20  CREATININE 0.94 0.89  CALCIUM 7.6* 7.7*    PT/INR: No results for input(s): LABPROT, INR in the last 72 hours. ABG    Component Value Date/Time   PHART 7.456 (H) 02/12/2020 1253   HCO3 29.0 (H) 02/12/2020 1253   TCO2 30 02/12/2020 1253   ACIDBASEDEF 3.0 (H) 02/06/2020 2222   O2SAT 88.0 02/12/2020  1253   CBG (last 3)  Recent Labs    02/13/20 2347 02/14/20 0340 02/14/20 0757  GLUCAP 126* 134* 136*    Meds Scheduled Meds: . aspirin  81 mg Per Tube Daily  . atorvastatin  10 mg Per Tube Daily  . bisacodyl  10 mg Oral Daily   Or  . bisacodyl  10 mg Rectal Daily  . chlorhexidine gluconate (MEDLINE KIT)  15 mL Mouth Rinse BID  . chlorhexidine gluconate (MEDLINE KIT)  15 mL Mouth Rinse BID  . Chlorhexidine Gluconate Cloth  6 each Topical Daily  . clopidogrel  75 mg Per Tube Daily  . colchicine  0.6 mg Per Tube BID  . docusate  200 mg Per Tube Daily  . folic acid  1 mg Intravenous Daily  . furosemide  40 mg Intravenous BID  . guaiFENesin  15 mL Per Tube Q6H  . insulin aspart  0-24 Units Subcutaneous Q4H  . mouth rinse  15 mL Mouth Rinse 10 times per day  . metoprolol tartrate  5 mg Intravenous Q6H  . mometasone-formoterol  2 puff Inhalation BID  . multivitamin  15 mL Per Tube Daily  . pantoprazole (PROTONIX) IV  40 mg Intravenous Q24H  . polyethylene glycol  17 g Per Tube Daily  . sodium chloride flush  10-40 mL Intracatheter Q12H  . sodium chloride flush  3 mL Intravenous Q12H  . thiamine  100 mg Per Tube Daily   Continuous Infusions: . sodium chloride Stopped (02/07/20 1415)  . sodium chloride    . sodium chloride Stopped (02/14/20 3646)  . amiodarone 30 mg/hr (02/14/20 0800)  . ampicillin-sulbactam (UNASYN) IV 3 g (02/14/20 0518)  . dexmedetomidine (PRECEDEX) IV infusion 1.2 mcg/kg/hr (02/14/20 0800)  . feeding supplement (VITAL AF 1.2 CAL) 40 mL/hr at 02/13/20 0530  . lactated ringers    . lactated ringers 50 mL/hr at 02/12/20 0900  . lactated ringers 20 mL/hr at 02/08/20 1831  . phenylephrine (NEO-SYNEPHRINE) Adult infusion Stopped (02/11/20 1258)  . potassium PHOSPHATE IVPB (in mmol)     PRN Meds:.sodium chloride, lactated ringers, metoprolol tartrate, ondansetron (ZOFRAN) IV, oxyCODONE, sodium chloride flush, sodium chloride flush  Xrays DG Chest 1  View  Result Date: 02/13/2020 CLINICAL DATA:  Hypoxia EXAM: CHEST  1 VIEW COMPARISON:  February 12, 2020 and February 07, 2020 FINDINGS: Endotracheal tube tip is 8.4 cm above the carina. Feeding tube tip is below the diaphragm. Right jugular catheter is no longer evident. There is a peripherally inserted central catheter with tip in the superior vena cava. Chest tube present on the right, unchanged. Mediastinal drain unchanged. No pneumothorax. There is bullous disease in the right upper lobe, stable. There is persistent airspace consolidation in the right mid lung. There is a small left pleural effusion. There is persistent diffuse interstitial prominence in the lower lung regions. No new opacity evident. Heart size is normal. Pulmonary vascularity reflects underlying emphysematous change with diminished vascularity in the upper lobes, particularly on the right, stable. No adenopathy evident. Postoperative median sternotomy noted. No bone lesions. IMPRESSION: Tube and catheter positions as described without evident pneumothorax. Underlying emphysematous change. Airspace opacity remains in the right mid lung. Interstitial prominence in the lower lung regions may represent a degree of fibrosis. Some of this opacity is due to redistribution of blood flow to viable lung segments. Stable small left pleural effusion. No new opacity evident. Stable cardiac silhouette. Electronically Signed   By: Lowella Grip III M.D.   On: 02/13/2020 09:32   ECHOCARDIOGRAM LIMITED  Result Date: 02/12/2020    ECHOCARDIOGRAM LIMITED REPORT   Patient Name:   Tony Munoz Date of Exam: 02/12/2020 Medical Rec #:  [id number]       Height:       69.0 in Accession #:    [id number]      Weight:       125.9 lb Date of Birth:  [birth year]       BSA:          1.697 m Patient Age:    64 years        BP:           143/100 mmHg Patient Gender: M               HR:           102 bpm. Exam Location:  Inpatient Procedure: Limited Echo, Limited Color Doppler  and Cardiac Doppler Indications:    Tamponade 423.3 / I31.4  History:        Patient has prior history of Echocardiogram examinations, most                 recent 02/07/2020. CAD, Abnormal ECG, Signs/Symptoms:Dyspnea; Risk                 Factors:Hypertension and Current Smoker. Recent CABG on  02/07/2020. Carotid artery disease.  Sonographer:    Darlina Sicilian RDCS Referring Phys: [id number] GLOVFIE Z Aviyana Sonntag  Sonographer Comments: Echo performed with patient supine and on artificial respirator. IMPRESSIONS  1. Left ventricular ejection fraction, by estimation, is 40 to 45%. The left ventricle has mildly decreased function. There is moderate hypokinesis of the left ventricular, basal-mid inferior wall, inferoseptal wall, septal wall and inferolateral wall.  2. Mild to moderate mitral valve regurgitation.  3. No pericardial effusion. Comparison(s): Changes from prior study are noted. 02/07/2020: LVEF 55-60% post-CABG. FINDINGS  Left Ventricle: Left ventricular ejection fraction, by estimation, is 40 to 45%. The left ventricle has mildly decreased function. Moderate hypokinesis of the left ventricular, basal-mid inferior wall, inferoseptal wall, septal wall and inferolateral wall. Pericardium: There is no evidence of pericardial effusion. There is no evidence of cardiac tamponade. Mitral Valve: Mild to moderate mitral valve regurgitation. Venous: The inferior vena cava was not well visualized.   LV Volumes (MOD) LV vol d, MOD A2C: 84.9 ml Diastology LV vol d, MOD A4C: 80.8 ml LV e' lateral:   8.31 cm/s LV vol s, MOD A2C: 42.2 ml LV E/e' lateral: 9.5 LV vol s, MOD A4C: 51.5 ml LV e' medial:    3.56 cm/s LV SV MOD A2C:     42.7 ml LV E/e' medial:  22.3 LV SV MOD A4C:     80.8 ml LV SV MOD BP:      34.3 ml AORTIC VALVE LVOT Vmax:   49.60 cm/s LVOT Vmean:  29.900 cm/s LVOT VTI:    0.094 m MITRAL VALVE MV Area (PHT): 6.96 cm    SHUNTS MV Decel Time: 109 msec    Systemic VTI: 0.09 m MV E velocity: 79.30 cm/s MV A  velocity: 72.40 cm/s MV E/A ratio:  1.10 Lyman Bishop MD Electronically signed by Lyman Bishop MD Signature Date/Time: 02/12/2020/10:37:14 AM    Final    Results for orders placed or performed during the hospital encounter of 02/06/20  Culture, bal-quantitative     Status: Abnormal   Collection Time: 02/10/20  2:54 PM   Specimen: Bronchoalveolar Lavage; Respiratory  Result Value Ref Range Status   Specimen Description BRONCHIAL ALVEOLAR LAVAGE  Final   Special Requests Normal  Final   Gram Stain   Final    FEW WBC PRESENT, PREDOMINANTLY PMN MODERATE YEAST Performed at [facility], [address] [place], [place] [address]    Culture 80,000 COLONIES/mL CANDIDA TROPICALIS (A)  Final   Report Status 02/12/2020 FINAL  Final   Assessment/Plan: S/P Procedure(s) (LRB): CORONARY ARTERY BYPASS GRAFTING (CABG),  x Four with Bilateral IMAs, and right leg greater saphenous vein (N/A) TRANSESOPHAGEAL ECHOCARDIOGRAM (TEE) (N/A) INDOCYANINE GREEN FLUORESCENCE IMAGING (ICG) (N/A) CAROTID ENDARTERECTOMY LEFT with  Patch Angioplasty Using Hemashield Platinum Finesse patch (Left)  1 stable on vent, conts PCCM management, hopefully won't need trach 2 BP runs low at times, good UOP, may be able to decrease lasix, apaced, conts IV amio 3 right apical pntx, CT drainage 150 cc yesterday- keep tube in place 4 replacing K+ 5 renal fxn stable 6 H/H fairly stable but platelet count is 77 K, may need to consider HIT panel 7 candida tropicalis on BAL, ? RX 8 TF's , may ned PEG 9 OT/PT SLP on board  LOS: 8 days    Tony Munoz 02/14/2020   Agree with assessment and plan. Continue to diurese; check blood cultures; consider attempt at extubation versus tracheostomy [place] Nauta Z. Orvan Seen, MD  [phone]

## 2020-02-14 NOTE — Progress Notes (Signed)
° °   °  [address]       [place] [address]             [phone]      Intubated  BP 125/87 (BP Location: Right Arm)    Pulse 93    Temp 98.7 F (37.1 C) (Axillary)    Resp 19    Ht 5\' 9"  (1.753 m)    Wt 58.4 kg    SpO2 100%    BMI 19.01 kg/m   Intake/Output Summary (Last 24 hours) at 02/14/2020 1833 Last data filed at 02/14/2020 1200 Gross per 24 hour  Intake 1170.18 ml  Output 1750 ml  Net -579.82 ml  on IV amiodarone IV Unasyn  04/15/2020 C. Viviann Spare, MD [facility] [phone]

## 2020-02-14 NOTE — Progress Notes (Signed)
eLink Physician-Brief Progress Note Patient Name: Tony Munoz DOB: [birth year] MRN: [id number]   Date of Service  02/14/2020  HPI/Events of Note  Pt is hypotensive, cardiology is the primary Attending physician for this patient.  eICU Interventions  Bedside RN requested to call cardiology attending  Physician for orders.        Tony Munoz 02/14/2020, 10:47 PM

## 2020-02-15 ENCOUNTER — Inpatient Hospital Stay (HOSPITAL_COMMUNITY): Payer: Medicare HMO

## 2020-02-15 DIAGNOSIS — J9601 Acute respiratory failure with hypoxia: Secondary | ICD-10-CM

## 2020-02-15 LAB — CBC
HCT: 25.8 % — ABNORMAL LOW (ref 39.0–52.0)
Hemoglobin: 8.5 g/dL — ABNORMAL LOW (ref 13.0–17.0)
MCH: 31.4 pg (ref 26.0–34.0)
MCHC: 32.9 g/dL (ref 30.0–36.0)
MCV: 95.2 fL (ref 80.0–100.0)
Platelets: 103 10*3/uL — ABNORMAL LOW (ref 150–400)
RBC: 2.71 MIL/uL — ABNORMAL LOW (ref 4.22–5.81)
RDW: 14.4 % (ref 11.5–15.5)
WBC: 8.3 10*3/uL (ref 4.0–10.5)
nRBC: 0 % (ref 0.0–0.2)

## 2020-02-15 LAB — COMPREHENSIVE METABOLIC PANEL
ALT: 43 U/L (ref 0–44)
AST: 23 U/L (ref 15–41)
Albumin: 2.3 g/dL — ABNORMAL LOW (ref 3.5–5.0)
Alkaline Phosphatase: 42 U/L (ref 38–126)
Anion gap: 9 (ref 5–15)
BUN: 21 mg/dL (ref 8–23)
CO2: 29 mmol/L (ref 22–32)
Calcium: 7 mg/dL — ABNORMAL LOW (ref 8.9–10.3)
Chloride: 101 mmol/L (ref 98–111)
Creatinine, Ser: 0.84 mg/dL (ref 0.61–1.24)
GFR calc Af Amer: >60 mL/min (ref >60)
GFR calc non Af Amer: >60 mL/min (ref >60)
Glucose, Bld: 163 mg/dL — ABNORMAL HIGH (ref 70–99)
Potassium: 2.8 mmol/L — ABNORMAL LOW (ref 3.5–5.1)
Sodium: 139 mmol/L (ref 135–145)
Total Bilirubin: 1.3 mg/dL — ABNORMAL HIGH (ref 0.3–1.2)
Total Protein: 4.2 g/dL — ABNORMAL LOW (ref 6.5–8.1)

## 2020-02-15 LAB — GLUCOSE, CAPILLARY
Glucose-Capillary: 111 mg/dL — ABNORMAL HIGH (ref 70–99)
Glucose-Capillary: 111 mg/dL — ABNORMAL HIGH (ref 70–99)
Glucose-Capillary: 118 mg/dL — ABNORMAL HIGH (ref 70–99)
Glucose-Capillary: 118 mg/dL — ABNORMAL HIGH (ref 70–99)
Glucose-Capillary: 124 mg/dL — ABNORMAL HIGH (ref 70–99)
Glucose-Capillary: 128 mg/dL — ABNORMAL HIGH (ref 70–99)
Glucose-Capillary: 132 mg/dL — ABNORMAL HIGH (ref 70–99)

## 2020-02-15 MED ORDER — FENTANYL CITRATE (PF) 100 MCG/2ML IJ SOLN
INTRAMUSCULAR | Status: AC
  Administered 2020-02-15: 50 ug via INTRAVENOUS
  Filled 2020-02-15: qty 2

## 2020-02-15 MED ORDER — MIDAZOLAM HCL 2 MG/2ML IJ SOLN
INTRAMUSCULAR | Status: AC
  Administered 2020-02-15: 2 mg via INTRAVENOUS
  Filled 2020-02-15: qty 2

## 2020-02-15 MED ORDER — ETOMIDATE 2 MG/ML IV SOLN
20.0000 mg | Freq: Once | INTRAVENOUS | Status: AC
  Administered 2020-02-15: 20 mg via INTRAVENOUS

## 2020-02-15 MED ORDER — POTASSIUM CHLORIDE 20 MEQ/15ML (10%) PO SOLN
20.0000 meq | ORAL | Status: AC
  Administered 2020-02-15 (×3): 20 meq
  Filled 2020-02-15 (×3): qty 15

## 2020-02-15 MED ORDER — MIDAZOLAM HCL 2 MG/2ML IJ SOLN: 2.0000 mg | Freq: Once | INTRAMUSCULAR | Status: AC

## 2020-02-15 MED ORDER — ROCURONIUM BROMIDE 50 MG/5ML IV SOLN
60.0000 mg | Freq: Once | INTRAVENOUS | Status: AC
  Administered 2020-02-15: 60 mg via INTRAVENOUS

## 2020-02-15 MED ORDER — FENTANYL CITRATE (PF) 100 MCG/2ML IJ SOLN: 50.0000 ug | Freq: Once | INTRAMUSCULAR | Status: AC

## 2020-02-15 MED ORDER — ENOXAPARIN SODIUM 40 MG/0.4ML ~~LOC~~ SOLN
40.0000 mg | Freq: Every day | SUBCUTANEOUS | Status: DC
  Administered 2020-02-15 – 2020-03-04 (×19): 40 mg via SUBCUTANEOUS
  Filled 2020-02-15 (×19): qty 0.4

## 2020-02-15 NOTE — Progress Notes (Addendum)
T[facility] DAILY ICU PROGRESS NOTE                   [address]            [place] [address]          [phone]   9 Days Post-Op Procedure(s) (LRB): CORONARY ARTERY BYPASS GRAFTING (CABG),  x Four with Bilateral IMAs, and right leg greater saphenous vein (N/A) TRANSESOPHAGEAL ECHOCARDIOGRAM (TEE) (N/A) INDOCYANINE GREEN FLUORESCENCE IMAGING (ICG) (N/A) CAROTID ENDARTERECTOMY LEFT with  Patch Angioplasty Using Hemashield Platinum Finesse patch (Left)  Total Length of Stay:  LOS: 9 days   Subjective:  hypotension overnight, improved with albumin, neo  Objective: Vital signs in last 24 hours: Temp:  [97.6 F (36.4 C)-98.9 F (37.2 C)] 97.6 F (36.4 C) (06/10 0733) Pulse Rate:  [89-114] 92 (06/10 0751) Cardiac Rhythm: Atrial paced (06/10 0400) Resp:  [16-31] 24 (06/10 0751) BP: (73-125)/(53-87) 95/66 (06/10 0751) SpO2:  [93 %-100 %] 100 % (06/10 0751) FiO2 (%):  [40 %] 40 % (06/10 0751) Weight:  [56.1 kg] 56.1 kg (06/10 0500)  Filed Weights   02/12/20 0600 02/13/20 0600 02/15/20 0500  Weight: 57.1 kg 58.4 kg 56.1 kg    Weight change:    Hemodynamic parameters for last 24 hours:    Intake/Output from previous day: 06/09 0701 - 06/10 0700 In: 1115.9 [I.V.:658.7; NG/GT:40; IV Piggyback:217.3] Out: 2300 [Urine:1910; Emesis/NG output:50; Stool:180; Chest Tube:160]  Intake/Output this shift: No intake/output data recorded.  Current Meds: Scheduled Meds: . aspirin  81 mg Per Tube Daily  . atorvastatin  10 mg Per Tube Daily  . bisacodyl  10 mg Oral Daily   Or  . bisacodyl  10 mg Rectal Daily  . chlorhexidine gluconate (MEDLINE KIT)  15 mL Mouth Rinse BID  . chlorhexidine gluconate (MEDLINE KIT)  15 mL Mouth Rinse BID  . Chlorhexidine Gluconate Cloth  6 each Topical Daily  . clopidogrel  75 mg Per Tube Daily  . colchicine  0.6 mg Per Tube BID  . docusate  200 mg Per Tube Daily  . folic acid  1 mg Intravenous Daily  . furosemide  40 mg Intravenous  BID  . guaiFENesin  15 mL Per Tube Q6H  . insulin aspart  0-24 Units Subcutaneous Q4H  . mouth rinse  15 mL Mouth Rinse 10 times per day  . metoprolol tartrate  5 mg Intravenous Q6H  . mometasone-formoterol  2 puff Inhalation BID  . multivitamin  15 mL Per Tube Daily  . pantoprazole (PROTONIX) IV  40 mg Intravenous Q24H  . polyethylene glycol  17 g Per Tube Daily  . potassium chloride  20 mEq Per Tube Q4H  . sodium chloride flush  10-40 mL Intracatheter Q12H  . sodium chloride flush  3 mL Intravenous Q12H  . thiamine  100 mg Per Tube Daily   Continuous Infusions: . sodium chloride Stopped (02/07/20 1415)  . sodium chloride    . sodium chloride Stopped (02/14/20 3007)  . amiodarone 30 mg/hr (02/15/20 0200)  . ampicillin-sulbactam (UNASYN) IV 3 g (02/15/20 0514)  . dexmedetomidine (PRECEDEX) IV infusion 1 mcg/kg/hr (02/15/20 0746)  . feeding supplement (VITAL AF 1.2 CAL) 40 mL/hr at 02/13/20 0530  . lactated ringers    . lactated ringers 50 mL/hr at 02/12/20 0900  . lactated ringers 20 mL/hr at 02/08/20 1831  . phenylephrine (NEO-SYNEPHRINE) Adult infusion 20 mcg/min (02/15/20 0200)   PRN Meds:.sodium chloride, lactated ringers, metoprolol tartrate, ondansetron (  ZOFRAN) IV, oxyCODONE, sodium chloride flush, sodium chloride flush  General appearance: alert, cooperative, fatigued and no distress Heart: a paced Lungs: coarse BS throughout Abdomen: soft, non tender Extremities: + RLE edema Wound: incis healing well, mod echymosis  Lab Results: CBC: Recent Labs    02/14/20 0500 02/15/20 0448  WBC 4.6 8.3  HGB 8.8* 8.5*  HCT 26.9* 25.8*  PLT 77* 103*   BMET:  Recent Labs    02/14/20 0500 02/15/20 0448  NA 141 139  K 3.3* 2.8*  CL 103 101  CO2 32 29  GLUCOSE 123* 163*  BUN 20 21  CREATININE 0.89 0.84  CALCIUM 7.7* 7.0*    CMET: Lab Results  Component Value Date   WBC 8.3 02/15/2020   HGB 8.5 (L) 02/15/2020   HCT 25.8 (L) 02/15/2020   PLT 103 (L) 02/15/2020    GLUCOSE 163 (H) 02/15/2020   ALT 43 02/15/2020   AST 23 02/15/2020   NA 139 02/15/2020   K 2.8 (L) 02/15/2020   CL 101 02/15/2020   CREATININE 0.84 02/15/2020   BUN 21 02/15/2020   CO2 29 02/15/2020   INR 1.8 (H) 02/06/2020   HGBA1C 5.2 02/02/2020      PT/INR: No results for input(s): LABPROT, INR in the last 72 hours. Radiology: DG CHEST PORT 1 VIEW  Result Date: 02/14/2020 CLINICAL DATA:  64 year old male with bilateral pneumothoraces, bilateral perihilar opacity. EXAM: PORTABLE CHEST 1 VIEW COMPARISON:  0641 hours today and earlier. FINDINGS: Bilateral chest tubes remain in place. There are semi upright AP portable views at 15 50 and 1551 hours. And interestingly, on the 2nd image moderate to large bilateral pneumothoraces present on the 1st image appear largely resolved. This may reflect chest tubes both to waterseal on the 1st image and then both to suction on the 2nd image. Otherwise stable lines and tubes. Large lung volumes. Coarse and asymmetric bilateral pulmonary opacity which is maximal in the right mid lung is stable from this morning. Stable cardiac size and mediastinal contours. IMPRESSION: 1. Bilateral pneumothoraces, which are largely resolved on the 2nd portable image of this exam which probably represents transition of bilateral chest tubes from waterseal to suction. 2.  Stable lines and tubes. 3. Stable bilateral pulmonary opacity maximal in the right mid lung. 4. No new cardiopulmonary abnormality. Electronically Signed   By: Genevie Ann M.D.   On: 02/14/2020 16:23   Results for orders placed or performed during the hospital encounter of 02/06/20  Culture, bal-quantitative     Status: Abnormal   Collection Time: 02/10/20  2:54 PM   Specimen: Bronchoalveolar Lavage; Respiratory  Result Value Ref Range Status   Specimen Description BRONCHIAL ALVEOLAR LAVAGE  Final   Special Requests Normal  Final   Gram Stain   Final    FEW WBC PRESENT, PREDOMINANTLY PMN MODERATE  YEAST Performed at [facility], [address] [place], [place] [address]    Culture 80,000 COLONIES/mL CANDIDA TROPICALIS (A)  Final   Report Status 02/12/2020 FINAL  Final  Culture, blood (routine x 2)     Status: None (Preliminary result)   Collection Time: 02/14/20  9:48 AM   Specimen: BLOOD RIGHT HAND  Result Value Ref Range Status   Specimen Description BLOOD RIGHT HAND  Final   Special Requests   Final    BOTTLES DRAWN AEROBIC AND ANAEROBIC Blood Culture adequate volume   Culture   Final    NO GROWTH < 24 HOURS Performed at [facility], [place] [address] [place], [place] [address]    Report Status PENDING  Incomplete  Culture, blood (routine x 2)     Status: None (Preliminary result)   Collection Time: 02/14/20  9:54 AM   Specimen: BLOOD LEFT HAND  Result Value Ref Range Status   Specimen Description BLOOD LEFT HAND  Final   Special Requests   Final    BOTTLES DRAWN AEROBIC AND ANAEROBIC Blood Culture adequate volume   Culture   Final    NO GROWTH < 24 HOURS Performed at [facility], [place] [address], [place], [place] [address]    Report Status PENDING  Incomplete     Assessment/Plan: S/P Procedure(s) (LRB): CORONARY ARTERY BYPASS GRAFTING (CABG),  x Four with Bilateral IMAs, and right leg greater saphenous vein (N/A) TRANSESOPHAGEAL ECHOCARDIOGRAM (TEE) (N/A) INDOCYANINE GREEN FLUORESCENCE IMAGING (ICG) (N/A) CAROTID ENDARTERECTOMY LEFT with  Patch Angioplasty Using Hemashield Platinum Finesse patch (Left)  1 VDRF- as per PCCM, + air leak in CT and R apical pntx- keep CT to suction Plan to proceed with Trach noted, conts unasyn for aspiration component. + yeast in BAL-blood cx no growth so far 2 cont neo for now , received albumin for drop in BP 3 slow drop in H/H conts- monitor for now, not at transfusion threshold 4 renal fxn is stable 5 K+ replacement ordered 6 a paced on amio gtt 7 cont TF's, may need PEG 8 therapies as able   Tony Giovanni PA-C  pager [phone] 02/15/2020 8:44 AM   Pt seen and examined; agree with notation. Suggest perc trach given left neck incision and sternotomy. Continue aggressive nutritional support. Niklas Chretien Z. Orvan Seen, [place]

## 2020-02-15 NOTE — Progress Notes (Signed)
NAME:  Tony Munoz, MRN:  [id number], DOB:  1955/10/08, LOS: 9 ADMISSION DATE:  02/06/2020, CONSULTATION DATE:  02/08/20 REFERRING MD:  Orvan Seen - CVTS, CHIEF COMPLAINT:  SOB, hypoxia   Brief History   64 yo M with CAD, HTN, COPD admit for elective CABG and L CEA, with progressive respiratory failure with hypoxia and hypercarbia and intubated on POD 3. Self extubated 6/6 and required re-intubation 6/7  Past Medical History  EtOH use  CAD  Anxiety Tobacco use disorder HTN   Significant Hospital Events   6/1 CABG and L CEA  6/2 receiving BZD for possible EtOH withdrawals. Was on precedex  6/3 worsening respiratory status with worsening hypoxia. Received 40 mg lasix. PCCM consulted 6/5 intubated and bronch with removal of aspirated pill on bronchus intermedius 6/6 Self extubated  6/7 Worsening hypoxia with respiratory distress and WOB. Reintubated  Consults:  Vascular surgery PCCM   Procedures:  6/1 CABG 6/1 L CEA   Significant Diagnostic Tests:  Bronch 6/5: mucus plugs bilaterally, blue pill in BI    Micro Data:  6/5 BAL-Candida tropicalis 6/9 blood culture   Antimicrobials:  Cefuroxime 6/1 (surg ppx)  vanc 6/1 Unasyn 6/4>>  Interim history/subjective:   Weaning on pressure support.  Continues to have significant secretions  Objective   Blood pressure 95/66, pulse 92, temperature 97.6 F (36.4 C), resp. rate (!) 24, height _0  (1.753 m), weight 56.1 kg, SpO2 100 %.    Vent Mode: CPAP;PSV FiO2 (%):  [40 %] 40 % Set Rate:  [18 bmp] 18 bmp Vt Set:  [570 mL] 570 mL PEEP:  [5 cmH20-8 cmH20] 5 cmH20 Pressure Support:  [12 cmH20] 12 cmH20 Plateau Pressure:  [12 cmH20-19 cmH20] 12 cmH20   Intake/Output Summary (Last 24 hours) at 02/15/2020 0840 Last data filed at 02/15/2020 0600 Gross per 24 hour  Intake 997.2 ml  Output 2250 ml  Net -1252.8 ml   Filed Weights   02/12/20 0600 02/13/20 0600 02/15/20 0500  Weight: 57.1 kg 58.4 kg 56.1 kg     Examination: Blood pressure 95/66, pulse 92, temperature 97.6 F (36.4 C), resp. rate (!) 24, height _1  (1.753 m), weight 56.1 kg, SpO2 100 %. Gen:      Chronically ill appearing, frail HEENT:  EOMI, sclera anicteric, ETT Neck:     No masses; no thyromegaly Lungs:    Clear to auscultation bilaterally; normal respiratory effort CV:         Regular rate and rhythm; no murmurs Abd:      + bowel sounds; soft, non-tender; no palpable masses, no distension Ext:    No edema; adequate peripheral perfusion Skin:      Warm and dry; no rash Neuro: Awake, responsive  Labs significant for potassium 2.8, BUN/creatinine 21/0.84 WBC 8.3, platelets 103  Assessment & Plan:   Acute metabolic encephalopathy - In the setting of hypoxia, hypercarbia, possible medication related in setting of CIWA protocol meds and post-op pain meds - EtOH use disorder with possible EtOH withdrawals  P: Continue precedex. Continue thiamine, folate.  Acute hypoxic and hypercarbic respiratory failure Aspiration-right lower lobe foreign body found during bronchoscopy Hx tobacco use HAP -Possible COPD exacerbation, possible component of post-op atelectasis. Wheezing + crackles on exam, likely element of pulm edema P: Continue vent support with daily weaning. Discussed with Dr. Orvan Seen.  Given difficulty weaning and continued secretions we will proceed with tracheostomy Continue chest PT, frequent suctioning. Continue bronchodilators, Unasyn.  CABG 6/1 L CEA 6/1  P: Continue management per to T[facility]. Continue aspirin and Plavix.  H/o HTN P: When sedation wears off may be able to resume antihypertensives.  Afib P: Continue amiodarone. Continue telemetry monitoring.  Thrombocytopenia P: Holding DVT prophylaxis and anticoagulation. Continue to monitor.  Cachexia, frailty P: Continue TF's.  Best practice:  Diet: Tube feeds Pain/Anxiety/Delirium protocol (if indicated): Precedex gtt VAP protocol  (if indicated): In place DVT prophylaxis: per primary, post op  GI prophylaxis: protonix  Glucose control: SSI  Mobility: BR Code Status: Full  Family Communication: Per primary team. Disposition: ICU   Critical care time:    The patient is critically ill with multiple organ system failure and requires high complexity decision making for assessment and support, frequent evaluation and titration of therapies, advanced monitoring, review of radiographic studies and interpretation of complex data.   Critical Care Time devoted to patient care services, exclusive of separately billable procedures, described in this note is 35 minutes.   Marshell Garfinkel MD [facility] Please see [email or web address] for pager details.  02/15/2020, 8:40 AM

## 2020-02-15 NOTE — Procedures (Signed)
Bronchoscopy  Indication: Trach placement  Consent: signed in chart  Anesthesia: See Dr. Shirlee More note  Procedure - Timeout performed - Bronchoscope advanced through ETT - Airways examined down to subsegmental level - Following airway examination, direct visualization of tracheostomy tube into tracheal lumen provided.  After trach placed, bronchoscope advanced through trachea to confirm endotracheal placement.  Findings - Moderate thick secretions  Specimen(s): none  Complications: None immediate

## 2020-02-15 NOTE — NC FL2 (Signed)
[place] LEVEL OF CARE SCREENING TOOL     IDENTIFICATION  Patient Name: Tony Munoz Birthdate: [birth year] Sex: male Admission Date (Current Location): 02/06/2020  [facility] and [place] Number:  Herbalist and Address:  [facility] [place] [address] [place], [place] [address]      Provider Number: [id number]  Attending Physician Name and Address:  Wonda Olds, MD  Relative Name and Phone Number:  Joseph Art [phone]    Current Level of Care: Hospital Recommended Level of Care: [facility] Prior Approval Number:    Date Approved/Denied: 02/15/20 PASRR Number: [id number]  Discharge Plan: SNF    Current Diagnoses: Patient Active Problem List   Diagnosis Date Noted   Pressure injury of skin 02/14/2020   Hypoxia    Acute respiratory failure ([place])    COPD with acute exacerbation ([place])    Protein-calorie malnutrition, severe 02/08/2020   Carotid stenosis, asymptomatic, left 02/06/2020   S/P CABG x 4 02/06/2020   Coronary artery disease involving native heart without angina pectoris    Abnormal nuclear stress test 01/09/2020   SOB (shortness of breath) 12/20/2019   Abnormal EKG 12/20/2019   Essential hypertension 12/20/2019   Bilateral carotid bruits 12/20/2019   Cigarette smoker 12/20/2019   Prominent abdominal aortic pulsation 12/20/2019   Radiculopathy 02/15/2019    Orientation RESPIRATION BLADDER Height & Weight      (Trach/intubated unable to assess)  Tracheostomy, Vent Continent Weight: 123 lb 10.9 oz (56.1 kg) Height:  5' 9"  (175.3 cm)  BEHAVIORAL SYMPTOMS/MOOD NEUROLOGICAL BOWEL NUTRITION STATUS      Incontinent (rectal tube) Diet (See discharge summary)  AMBULATORY STATUS COMMUNICATION OF NEEDS Skin   Extensive Assist  (intubated/trach) Other (Comment), Surgical wounds (Approp for ethn,dry,ecchymosis,chest left right leg left right pressure injury sacrum medial stage 2  incision closed leg right incision closed chest other incision closed neck)                       Personal Care Assistance Level of Assistance  Bathing, Dressing, Feeding Bathing Assistance: Maximum assistance Feeding assistance: Maximum assistance (Cortrak) Dressing Assistance: Maximum assistance     Functional Limitations Info  Sight, Hearing, Speech     Speech Info: Impaired (intubated/trach)    SPECIAL CARE FACTORS FREQUENCY  PT (By licensed PT), OT (By licensed OT)     PT Frequency: 5x min weekly OT Frequency: 5x min weekly            Contractures Contractures Info: Not present    Additional Factors Info  Code Status Code Status Info: FULL             Current Medications (02/15/2020):  This is the current hospital active medication list Current Facility-Administered Medications  Medication Dose Route Frequency Provider Last Rate Last Admin   0.45 % sodium chloride infusion   Intravenous Continuous PRN Gold, Wayne E, PA-C   Stopped at 02/07/20 1415   0.9 %  sodium chloride infusion  250 mL Intravenous Continuous Gold, Wayne E, PA-C       0.9 %  sodium chloride infusion   Intravenous Continuous Gold, Wilder Glade, PA-C   Stopped at 02/14/20 4332   amiodarone (NEXTERONE PREMIX) 360-4.14 MG/200ML-% (1.8 mg/mL) IV infusion  30 mg/hr Intravenous Continuous Lightfoot, Lucile Crater, MD 16.67 mL/hr at 02/15/20 1019 30 mg/hr at 02/15/20 1019   Ampicillin-Sulbactam (UNASYN) 3 g in sodium chloride 0.9 % 100 mL IVPB  3  g Intravenous Q6H Kara Mead V, MD 200 mL/hr at 02/15/20 1125 3 g at 02/15/20 1125   aspirin chewable tablet 81 mg  81 mg Per Tube Daily Wonda Olds, MD   81 mg at 02/15/20 0835   atorvastatin (LIPITOR) tablet 10 mg  10 mg Per Tube Daily Wonda Olds, MD   10 mg at 02/15/20 0836   bisacodyl (DULCOLAX) EC tablet 10 mg  10 mg Oral Daily Gold, Wayne E, PA-C   10 mg at 02/13/20 1232   Or   bisacodyl (DULCOLAX) suppository 10 mg  10 mg Rectal  Daily Gold, Wayne E, PA-C       chlorhexidine gluconate (MEDLINE KIT) (PERIDEX) 0.12 % solution 15 mL  15 mL Mouth Rinse BID Wonda Olds, MD   15 mL at 02/15/20 0050   chlorhexidine gluconate (MEDLINE KIT) (PERIDEX) 0.12 % solution 15 mL  15 mL Mouth Rinse BID Wonda Olds, MD   15 mL at 02/15/20 0800   Chlorhexidine Gluconate Cloth 2 % PADS 6 each  6 each Topical Daily Wonda Olds, MD   6 each at 02/14/20 1203   clopidogrel (PLAVIX) tablet 75 mg  75 mg Per Tube Daily Wonda Olds, MD   75 mg at 02/15/20 0836   colchicine tablet 0.6 mg  0.6 mg Per Tube BID Fredrich Romans Z, MD   0.6 mg at 02/15/20 0836   dexmedetomidine (PRECEDEX) 400 MCG/100ML (4 mcg/mL) infusion  0.4-1.2 mcg/kg/hr Intravenous Titrated Mannam, Praveen, MD 14.28 mL/hr at 02/15/20 1452 1 mcg/kg/hr at 02/15/20 1452   docusate (COLACE) 50 MG/5ML liquid 200 mg  200 mg Per Tube Daily Fredrich Romans Z, MD   200 mg at 02/13/20 1234   enoxaparin (LOVENOX) injection 40 mg  40 mg Subcutaneous Daily Prescott Gum, Collier Salina, MD   40 mg at 02/15/20 1529   feeding supplement (VITAL AF 1.2 CAL) liquid 1,000 mL  1,000 mL Per Tube Continuous Mannam, Praveen, MD 40 mL/hr at 02/13/20 0530 Rate Change at 53/97/67 3419   folic acid injection 1 mg  1 mg Intravenous Daily Fredrich Romans Z, MD   1 mg at 02/15/20 1146   furosemide (LASIX) injection 40 mg  40 mg Intravenous BID Wonda Olds, MD   40 mg at 02/14/20 1840   guaiFENesin (ROBITUSSIN) 100 MG/5ML solution 300 mg  15 mL Per Tube Q6H Atkins, Broadus Z, MD   300 mg at 02/15/20 1129   insulin aspart (novoLOG) injection 0-24 Units  0-24 Units Subcutaneous Q4H Wonda Olds, MD   2 Units at 02/15/20 0043   lactated ringers infusion 500 mL  500 mL Intravenous Once PRN Gold, Wayne E, PA-C       lactated ringers infusion   Intravenous Continuous Prescott Gum, Collier Salina, MD 50 mL/hr at 02/12/20 0900 Rate Verify at 02/12/20 0900   lactated ringers infusion   Intravenous  Continuous Gold, Wayne E, PA-C 20 mL/hr at 02/08/20 1831 New Bag at 02/08/20 1831   MEDLINE mouth rinse  15 mL Mouth Rinse 10 times per day Wonda Olds, MD   15 mL at 02/15/20 1445   metoprolol tartrate (LOPRESSOR) injection 2.5-5 mg  2.5-5 mg Intravenous Q2H PRN Jadene Pierini E, PA-C   5 mg at 02/10/20 1403   metoprolol tartrate (LOPRESSOR) injection 5 mg  5 mg Intravenous Q6H Prescott Gum, Collier Salina, MD   5 mg at 02/15/20 1529   mometasone-formoterol (DULERA) 200-5 MCG/ACT inhaler 2 puff  2 puff  Inhalation BID Prescott Gum, Collier Salina, MD       multivitamin liquid 15 mL  15 mL Per Tube Daily Wonda Olds, MD   15 mL at 02/15/20 0835   ondansetron (ZOFRAN) injection 4 mg  4 mg Intravenous Q6H PRN Jadene Pierini E, PA-C       oxyCODONE (Oxy IR/ROXICODONE) immediate release tablet 5 mg  5 mg Oral Q3H PRN Prescott Gum, Collier Salina, MD       pantoprazole (PROTONIX) injection 40 mg  40 mg Intravenous Q24H Cristal Generous, NP   40 mg at 02/14/20 1840   phenylephrine (NEOSYNEPHRINE) 20-0.9 MG/250ML-% infusion  0-400 mcg/min Intravenous Titrated Wonda Olds, MD 18.75 mL/hr at 02/15/20 1541 25 mcg/min at 02/15/20 1541   polyethylene glycol (MIRALAX / GLYCOLAX) packet 17 g  17 g Per Tube Daily Wonda Olds, MD   17 g at 02/13/20 1232   sodium chloride flush (NS) 0.9 % injection 10-40 mL  10-40 mL Intracatheter Q12H Atkins, Glenice Bow, MD   20 mL at 02/14/20 0910   sodium chloride flush (NS) 0.9 % injection 10-40 mL  10-40 mL Intracatheter PRN Wonda Olds, MD       sodium chloride flush (NS) 0.9 % injection 3 mL  3 mL Intravenous Q12H Gold, Wayne E, PA-C   3 mL at 02/14/20 0910   sodium chloride flush (NS) 0.9 % injection 3 mL  3 mL Intravenous PRN Gold, Wayne E, PA-C       thiamine tablet 100 mg  100 mg Per Tube Daily Wonda Olds, MD   100 mg at 02/15/20 6015     Discharge Medications: Please see discharge summary for a list of discharge medications.  Relevant Imaging  Results:  Relevant Lab Results:   Additional Information [id number]  Trula Ore, LCSWA

## 2020-02-15 NOTE — Procedures (Signed)
Bedside Tracheostomy Insertion Procedure Note   Patient Details:   Name: Tony Munoz DOB: 1956/06/10 MRN: [id number]  Procedure: Tracheostomy  Pre Procedure Assessment: ET Tube Size:8.0 ET Tube secured at lip (cm):25 Bite block in place: Yes Breath Sounds: Rhonch  Post Procedure Assessment: BP (!) 87/63   Pulse 92   Temp (!) 97.1 F (36.2 C) (Axillary)   Resp 18   Ht 5\' 9"  (1.753 m)   Wt 56.1 kg   SpO2 100%   BMI 18.26 kg/m  O2 sats: stable throughout Complications: No apparent complications Patient did tolerate procedure well Tracheostomy Brand:Shiley Tracheostomy Style:Cuffed Tracheostomy Size: 6.0 Tracheostomy Secured and velcro Tracheostomy Placement Confirmation:Trach cuff visualized and in place and chest x-ray Ventilator settings changed during trach insertion procedure.  TWY:SORTQSY made aware    Lenox Ponds 02/15/2020, 4:15 PM

## 2020-02-15 NOTE — Progress Notes (Signed)
Patient ID: Tony Munoz, male   DOB: 06/23/1956, 64 y.o.   MRN: [id number] TCTS Evening Rounds:  Hemodynamically stable  A-paced 92  Remains on vent 40% FiO2  Tracheostomy placed today.  Urine output ok.

## 2020-02-15 NOTE — Procedures (Signed)
Procedure: Percutaneous Tracheostomy CPT 31600 Performed by: Dr. Chilton Greathouse, Dr Myrla Halsted Bronchoscopy Assistant: Dr. Myrla Halsted  Indications: Chronic respiratory failure and need for ongoing mechanical ventilation.  Consent: Signed in chart  Preprocedure: Universal protocol was followed for this procedure. Timeout performed. Anterior neck prepped and draped.  Anesthesia: The patient was intubated and sedated prior to the procedure. Additional midazolam, etomidate, fentanyl and vecuronium were given for sedation and paralysis with close attention to vital signs throughout procedure.   Procedure: The patient was placed in the supine position. The anterior neck was prepped and draped in usual sterile fashion. 1% lidocaine was administered approximately 2 fingerbreadths above the sternal notch for local anesthesia. A 1.5-cm vertical incision was then performed 2 fingerbreadths above the sternal notch. Using a curved Kelly, blunt dissection was performed down to the level of the pretracheal fascia. At this point, the bronchoscope was introduced through the endotracheal tube and the trachea was properly visualized. The endotracheal tube was then gradually withdrawn within the trachea under direct bronchoscopic visualization. Proper midline position was confirmed by bouncing the needle from the tracheostomy tray over the trachea with bronchoscopic examination. The needle was advanced into the trachea and proper positioning was confirmed with direct visualization. The needle was then removed leaving a white outer cannula in position. The wire from the tracheostomy tray was then advanced through the white outer cannula. The cannula was then removed. The small, blue dilator was then advanced over the wire into the trachea. Once proper dilatation was achieved, the dilator was removed. The large, tapered dilator was then advanced over the wire into the trachea. The dilator was removed leaving the wire and white  inner cannula in position. A number #6 percutaneous Shiley tracheostomy tube was then advanced over the wire and white inner cannula into the trachea. Proper positioning was confirmed with bronchoscopic visualization. The tracheostomy tube was then sutured in place with four nylon sutures. It was further secured with a tracheostomy tie.  Estimated blood loss: Less than 5 mL.  Complications: None immediate.  CXR ordered.   Chilton Greathouse MD [facility] Please see [email or web address] for pager details.  02/15/2020, 6:41 PM

## 2020-02-15 NOTE — TOC Initial Note (Signed)
Transition of Care [facility]) - Initial/Assessment Note    Patient Details  Name: Tony Munoz MRN: [id number] Date of Birth: 02/01/1956  Transition of Care [facility]) CM/SW Contact:    Terrial Rhodes, LCSWA Phone Number: 02/15/2020, 3:40 PM  Clinical Narrative:                  CSW spoke with patients sister Luster Landsberg about possible SNF placement when patient is medically ready. Renee was agreeable to SNF placement for patient. CSW explained that she will continue to follow patient and keep patients sister updated with progress for SNF placement. Patients sister gave CSW permission to fax out in the [place] area for SNF placement when patient is ready.  CSW will continue to follow patient for SNF placment  Expected Discharge Plan: Skilled Nursing Facility Barriers to Discharge: Continued Medical Work up   Patient Goals and CMS Choice   CMS Medicare.gov Compare Post Acute Care list provided to:: Patient Represenative (must comment) (sister Renee) Choice offered to / list presented to : Sibling Luster Landsberg)  Expected Discharge Plan and Services Expected Discharge Plan: Skilled Nursing Facility       Living arrangements for the past 2 months: Single Family Home                                      Prior Living Arrangements/Services Living arrangements for the past 2 months: Single Family Home Lives with:: Self Patient language and need for interpreter reviewed:: Yes Do you feel safe going back to the place where you live?: No   SNF  Need for Family Participation in Patient Care: Yes (Comment) Care giver support system in place?: Yes (comment)   Criminal Activity/Legal Involvement Pertinent to Current Situation/Hospitalization: No - Comment as needed  Activities of Daily Living      Permission Sought/Granted Permission sought to share information with : Case Manager, Magazine features editor, Family Supports                Emotional Assessment        Orientation: :  (Intubated/trach unable to assess) Alcohol / Substance Use: Not Applicable Psych Involvement: No (comment)  Admission diagnosis:  Carotid stenosis, asymptomatic, left [I65.22] S/P CABG x 4 [Z95.1] Patient Active Problem List   Diagnosis Date Noted  . Pressure injury of skin 02/14/2020  . Hypoxia   . Acute respiratory failure ([facility])   . COPD with acute exacerbation ([facility])   . Protein-calorie malnutrition, severe 02/08/2020  . Carotid stenosis, asymptomatic, left 02/06/2020  . S/P CABG x 4 02/06/2020  . Coronary artery disease involving native heart without angina pectoris   . Abnormal nuclear stress test 01/09/2020  . SOB (shortness of breath) 12/20/2019  . Abnormal EKG 12/20/2019  . Essential hypertension 12/20/2019  . Bilateral carotid bruits 12/20/2019  . Cigarette smoker 12/20/2019  . Prominent abdominal aortic pulsation 12/20/2019  . Radiculopathy 02/15/2019   PCP:  Buckner [place], MD Pharmacy:   [facility] [address], [place] - [address] [place] [address] Phone: [phone] Fax: [phone]     Social Determinants of Health (SDOH) Interventions    Readmission Risk Interventions No flowsheet data found.

## 2020-02-15 NOTE — Progress Notes (Signed)
Contacted Dr. Dorris Fetch in reference to noted hypotension with MAPs in mid 50s. Given verbal order to give Albumin and to follow that up with a phenylephrine gtt if the Albumin did not normalize Mr. Horney's BP.

## 2020-02-15 NOTE — Progress Notes (Signed)
Physical Therapy Treatment Patient Details Name: Tony Munoz MRN: [id number] DOB: March 14, 1956 Today's Date: 02/15/2020    History of Present Illness 64 y.o male presented for surgery 02/06/20 for CABGx4 , bilateral IMA, carotid endartectomy. Complications from ETOH, worsening respiratory function requiring bipap. Extubated 06/02, off bipap 06/04. 6/5 pt re-intubated d/t bil mucus plugs, bronch with removal of aspirated pill on bronchus intermedius, pt self-extubated 6/6, re-intubated 6/7 due to worsening hypoxia with respiratory distress and WOB. PMHx includes radiculopathy, HTN, dyspnea, smoker, carotid artery occulsion, anxiety.    PT Comments    Pt received in bed, nursing staff alerted PT that trach team was coming to put trach in shortly asked to not get pt up to chair. Pt agreed to bed level therex 3x10 SLR, abd/add, ankle pumps. Attempted to try EOB, pt declined interest in transferring to EOB. Updated d/c plans from CIR to SNF. Will continue to follow acutely until d/c to next venue of care.    Follow Up Recommendations  SNF     Equipment Recommendations  Other (comment) (tbd)    Recommendations for Other Services       Precautions / Restrictions Precautions Precautions: Sternal;Fall;ICD/Pacemaker Precaution Comments: external pacemaker, cortrak, bil wrist restraints and mitts, chest tube     Mobility  Bed Mobility Overal bed mobility: Needs Assistance             General bed mobility comments: Completed LE therex in bed  Transfers                 General transfer comment: deferred today  Ambulation/Gait             General Gait Details: defered today   Stairs             Wheelchair Mobility    Modified Rankin (Stroke Patients Only)       Balance Overall balance assessment: Needs assistance Sitting-balance support: No upper extremity supported Sitting balance-Leahy Scale: Poor Sitting balance - Comments: pt declined transfer to  EOB                                    Cognition Arousal/Alertness: Lethargic Behavior During Therapy: Flat affect Overall Cognitive Status: Impaired/Different from baseline Area of Impairment: Orientation                 Orientation Level: Disoriented to;Time     Following Commands: Follows one step commands consistently;Follows one step commands with increased time Safety/Judgement: Decreased awareness of safety;Decreased awareness of deficits     General Comments: Pt appeared more alert today, declined getting EOB but willing to complete LE therex. Pt required verbal and tactile cues to complete LE therex in bed      Exercises Total Joint Exercises Ankle Circles/Pumps: AROM;Both;Supine (3x10) Hip ABduction/ADduction: AROM;Other reps (comment);Both;Supine (3x10) Straight Leg Raises: AROM;Other reps (comment);Both;Supine (3x10)    General Comments General comments (skin integrity, edema, etc.): brusing remains along medial aspect of bilateral thighs, sternal incision appears WNL. Pt agreeable to bed level LE therex, VSS.      Pertinent Vitals/Pain Pain Assessment: Faces Faces Pain Scale: Hurts a little bit Pain Location: generalized  Pain Descriptors / Indicators: Discomfort;Grimacing Pain Intervention(s): Monitored during session;Repositioned    Home Living                      Prior Function  PT Goals (current goals can now be found in the care plan section) Acute Rehab PT Goals Patient Stated Goal: none stated, agreeable to working with therapies, nodding head in agreement when educated on therapy course while in hospital  PT Goal Formulation: With patient Time For Goal Achievement: 02/23/20 Potential to Achieve Goals: Good    Frequency    Min 2X/week      PT Plan Discharge plan needs to be updated;Frequency needs to be updated    Co-evaluation              AM-PAC PT "6 Clicks" Mobility   Outcome  Measure  Help needed turning from your back to your side while in a flat bed without using bedrails?: Total Help needed moving from lying on your back to sitting on the side of a flat bed without using bedrails?: Total Help needed moving to and from a bed to a chair (including a wheelchair)?: Total Help needed standing up from a chair using your arms (e.g., wheelchair or bedside chair)?: Total Help needed to walk in hospital room?: Total Help needed climbing 3-5 steps with a railing? : Total 6 Click Score: 6    End of Session Equipment Utilized During Treatment: Oxygen Activity Tolerance: Patient tolerated treatment well Patient left: in bed;with call bell/phone within reach Nurse Communication: Mobility status PT Visit Diagnosis: Other abnormalities of gait and mobility (R26.89);Pain     Time: 5003-7048 PT Time Calculation (min) (ACUTE ONLY): 12 min  Charges:  $Therapeutic Exercise: 8-22 mins                     [employer] SPT 02/15/2020    Rolland Porter 02/15/2020, 3:11 PM

## 2020-02-16 LAB — CBC
HCT: 26 % — ABNORMAL LOW (ref 39.0–52.0)
Hemoglobin: 8.4 g/dL — ABNORMAL LOW (ref 13.0–17.0)
MCH: 31 pg (ref 26.0–34.0)
MCHC: 32.3 g/dL (ref 30.0–36.0)
MCV: 95.9 fL (ref 80.0–100.0)
Platelets: 142 10*3/uL — ABNORMAL LOW (ref 150–400)
RBC: 2.71 MIL/uL — ABNORMAL LOW (ref 4.22–5.81)
RDW: 14.7 % (ref 11.5–15.5)
WBC: 12.2 10*3/uL — ABNORMAL HIGH (ref 4.0–10.5)
nRBC: 0 % (ref 0.0–0.2)

## 2020-02-16 LAB — GLUCOSE, CAPILLARY
Glucose-Capillary: 101 mg/dL — ABNORMAL HIGH (ref 70–99)
Glucose-Capillary: 108 mg/dL — ABNORMAL HIGH (ref 70–99)
Glucose-Capillary: 111 mg/dL — ABNORMAL HIGH (ref 70–99)
Glucose-Capillary: 114 mg/dL — ABNORMAL HIGH (ref 70–99)
Glucose-Capillary: 119 mg/dL — ABNORMAL HIGH (ref 70–99)
Glucose-Capillary: 130 mg/dL — ABNORMAL HIGH (ref 70–99)
Glucose-Capillary: 98 mg/dL (ref 70–99)

## 2020-02-16 LAB — BASIC METABOLIC PANEL
Anion gap: 8 (ref 5–15)
Anion gap: 7 (ref 5–15)
BUN: 19 mg/dL (ref 8–23)
BUN: 20 mg/dL (ref 8–23)
CO2: 27 mmol/L (ref 22–32)
CO2: 27 mmol/L (ref 22–32)
Calcium: 7.1 mg/dL — ABNORMAL LOW (ref 8.9–10.3)
Calcium: 7.5 mg/dL — ABNORMAL LOW (ref 8.9–10.3)
Chloride: 102 mmol/L (ref 98–111)
Chloride: 104 mmol/L (ref 98–111)
Creatinine, Ser: 0.82 mg/dL (ref 0.61–1.24)
Creatinine, Ser: 0.78 mg/dL (ref 0.61–1.24)
GFR calc Af Amer: >60 mL/min (ref >60)
GFR calc Af Amer: >60 mL/min (ref >60)
GFR calc non Af Amer: >60 mL/min (ref >60)
GFR calc non Af Amer: >60 mL/min (ref >60)
Glucose, Bld: 162 mg/dL — ABNORMAL HIGH (ref 70–99)
Glucose, Bld: 128 mg/dL — ABNORMAL HIGH (ref 70–99)
Potassium: 3.1 mmol/L — ABNORMAL LOW (ref 3.5–5.1)
Potassium: 3.6 mmol/L (ref 3.5–5.1)
Sodium: 137 mmol/L (ref 135–145)
Sodium: 138 mmol/L (ref 135–145)

## 2020-02-16 MED ORDER — AMIODARONE PEDIATRIC ORAL SUSPENSION 5 MG/ML
200.0000 mg | Freq: Two times a day (BID) | ORAL | Status: DC
  Filled 2020-02-16: qty 40

## 2020-02-16 MED ORDER — POTASSIUM CHLORIDE 10 MEQ/50ML IV SOLN
10.0000 meq | INTRAVENOUS | Status: AC
  Administered 2020-02-16 (×3): 10 meq via INTRAVENOUS
  Filled 2020-02-16 (×3): qty 50

## 2020-02-16 MED ORDER — AMIODARONE HCL 200 MG PO TABS
200.0000 mg | ORAL_TABLET | Freq: Two times a day (BID) | ORAL | Status: DC
  Administered 2020-02-16 – 2020-03-04 (×35): 200 mg
  Filled 2020-02-16 (×35): qty 1

## 2020-02-16 MED ORDER — POTASSIUM CHLORIDE 20 MEQ/15ML (10%) PO SOLN
20.0000 meq | ORAL | Status: AC
  Administered 2020-02-16 (×3): 20 meq
  Filled 2020-02-16 (×3): qty 15

## 2020-02-16 NOTE — Progress Notes (Signed)
° °   °  [address]       [place] [address]             [phone]      POD # 10 CABG, CEA  On CPAP earlier back on PRVC now  BP 90/68    Pulse 82    Temp 98.4 F (36.9 C) (Oral)    Resp (!) 22    Ht 5\' 9"  (1.753 m)    Wt 58.3 kg    SpO2 100%    BMI 18.98 kg/m   Intake/Output Summary (Last 24 hours) at 02/16/2020 1721 Last data filed at 02/16/2020 1543 Gross per 24 hour  Intake 2722.8 ml  Output 4270 ml  Net -1547.2 ml   K=3.6 up from 3.1 earlier  Continue current Rx  Donyale Falcon C. 04/17/2020, MD [facility] [phone]

## 2020-02-16 NOTE — Progress Notes (Addendum)
[address]       [place] [address]             [phone]      10 Days Post-Op Procedure(s) (LRB): CORONARY ARTERY BYPASS GRAFTING (CABG),  x Four with Bilateral IMAs, and right leg greater saphenous vein (N/A) TRANSESOPHAGEAL ECHOCARDIOGRAM (TEE) (N/A) INDOCYANINE GREEN FLUORESCENCE IMAGING (ICG) (N/A) CAROTID ENDARTERECTOMY LEFT with  Patch Angioplasty Using Hemashield Platinum Finesse patch (Left) Subjective:  perc trach done yesterday  Objective: Vital signs in last 24 hours: Temp:  [97.1 F (36.2 C)-98.6 F (37 C)] 98.3 F (36.8 C) (06/11 0750) Pulse Rate:  [78-94] 78 (06/11 0845) Cardiac Rhythm: Atrial paced (06/11 0800) Resp:  [13-39] 20 (06/11 0845) BP: (73-131)/(51-91) 91/61 (06/11 0845) SpO2:  [90 %-100 %] 90 % (06/11 0845) FiO2 (%):  [40 %] 40 % (06/11 0813) Weight:  [58.3 kg] 58.3 kg (06/11 0439)  Hemodynamic parameters for last 24 hours:    Intake/Output from previous day: 06/10 0701 - 06/11 0700 In: 2239.6 [I.V.:1564; NG/GT:256; IV Piggyback:419.6] Out: 3295 [Urine:1725; Stool:1250; Chest Tube:320] Intake/Output this shift: Total I/O In: 136.7 [I.V.:70.9; NG/GT:55; IV Piggyback:10.8] Out: 0   General appearance: alert, cooperative and no distress Heart: regular rate and rhythm Lungs: diminished in lower fields , some upper airway ronchi Abdomen: benign Extremities: right leg edema improved Wound: healing well, echymosis slowly improving  Lab Results: Recent Labs    02/15/20 0448 02/16/20 0750  WBC 8.3 12.2*  HGB 8.5* 8.4*  HCT 25.8* 26.0*  PLT 103* 142*   BMET:  Recent Labs    02/15/20 0448 02/16/20 0750  NA 139 137  K 2.8* 3.1*  CL 101 102  CO2 29 27  GLUCOSE 163* 162*  BUN 21 19  CREATININE 0.84 0.82  CALCIUM 7.0* 7.1*    PT/INR: No results for input(s): LABPROT, INR in the last 72 hours. ABG    Component Value Date/Time   PHART 7.456 (H) 02/12/2020 1253   HCO3 29.0 (H) 02/12/2020 1253   TCO2 30  02/12/2020 1253   ACIDBASEDEF 3.0 (H) 02/06/2020 2222   O2SAT 88.0 02/12/2020 1253   CBG (last 3)  Recent Labs    02/15/20 2352 02/16/20 0339 02/16/20 0749  GLUCAP 124* 101* 108*    Meds Scheduled Meds: . amiodarone  200 mg Per Tube BID  . aspirin  81 mg Per Tube Daily  . atorvastatin  10 mg Per Tube Daily  . bisacodyl  10 mg Oral Daily   Or  . bisacodyl  10 mg Rectal Daily  . chlorhexidine gluconate (MEDLINE KIT)  15 mL Mouth Rinse BID  . Chlorhexidine Gluconate Cloth  6 each Topical Daily  . clopidogrel  75 mg Per Tube Daily  . colchicine  0.6 mg Per Tube BID  . docusate  200 mg Per Tube Daily  . enoxaparin (LOVENOX) injection  40 mg Subcutaneous Daily  . folic acid  1 mg Intravenous Daily  . furosemide  40 mg Intravenous BID  . guaiFENesin  15 mL Per Tube Q6H  . insulin aspart  0-24 Units Subcutaneous Q4H  . mouth rinse  15 mL Mouth Rinse 10 times per day  . mometasone-formoterol  2 puff Inhalation BID  . multivitamin  15 mL Per Tube Daily  . pantoprazole (PROTONIX) IV  40 mg Intravenous Q24H  . polyethylene glycol  17 g Per Tube Daily  . sodium chloride flush  10-40 mL Intracatheter Q12H  . sodium  chloride flush  3 mL Intravenous Q12H  . thiamine  100 mg Per Tube Daily   Continuous Infusions: . sodium chloride Stopped (02/07/20 1415)  . sodium chloride    . sodium chloride Stopped (02/14/20 0737)  . ampicillin-sulbactam (UNASYN) IV Stopped (02/16/20 0755)  . dexmedetomidine (PRECEDEX) IV infusion 1 mcg/kg/hr (02/16/20 0800)  . feeding supplement (VITAL AF 1.2 CAL) 55 mL/hr at 02/16/20 0700  . lactated ringers    . lactated ringers 50 mL/hr at 02/12/20 0900  . lactated ringers 20 mL/hr at 02/08/20 1831  . phenylephrine (NEO-SYNEPHRINE) Adult infusion 30 mcg/min (02/16/20 0800)   PRN Meds:.sodium chloride, lactated ringers, metoprolol tartrate, ondansetron (ZOFRAN) IV, oxyCODONE, sodium chloride flush, sodium chloride flush  Xrays DG Chest 1 View  Result  Date: 02/15/2020 CLINICAL DATA:  Hypoxia EXAM: CHEST  1 VIEW COMPARISON:  February 14, 2020 FINDINGS: Endotracheal tube tip is 6.5 cm above the carina. Feeding tube tip extends below the diaphragm. Central catheter tip is in the superior vena cava. There are chest tubes bilaterally. Bilateral pneumothoraces noted 1 day prior have resolved. There is bullous disease noted in the right upper lobe. There is persistent airspace consolidation in the right mid lung and to a lesser extent in the lung bases. Heart size is normal. There is diminished pulmonary vascularity on the right due to underlying bullous disease. Pulmonary vascularity on the left is grossly normal. No adenopathy appreciable. No bone lesions. IMPRESSION: No appreciable pneumothorax on either side with a chest tube present on each side. Other tube and catheter positions as noted. Airspace consolidation right mid lung as well as in the bases remains. Bullous disease right upper lobe. Stable cardiac silhouette. Electronically Signed   By: Lowella Grip III M.D.   On: 02/15/2020 09:21   DG CHEST PORT 1 VIEW  Result Date: 02/14/2020 CLINICAL DATA:  64 year old male with bilateral pneumothoraces, bilateral perihilar opacity. EXAM: PORTABLE CHEST 1 VIEW COMPARISON:  0641 hours today and earlier. FINDINGS: Bilateral chest tubes remain in place. There are semi upright AP portable views at 15 50 and 1551 hours. And interestingly, on the 2nd image moderate to large bilateral pneumothoraces present on the 1st image appear largely resolved. This may reflect chest tubes both to waterseal on the 1st image and then both to suction on the 2nd image. Otherwise stable lines and tubes. Large lung volumes. Coarse and asymmetric bilateral pulmonary opacity which is maximal in the right mid lung is stable from this morning. Stable cardiac size and mediastinal contours. IMPRESSION: 1. Bilateral pneumothoraces, which are largely resolved on the 2nd portable image of this exam  which probably represents transition of bilateral chest tubes from waterseal to suction. 2.  Stable lines and tubes. 3. Stable bilateral pulmonary opacity maximal in the right mid lung. 4. No new cardiopulmonary abnormality. Electronically Signed   By: Genevie Ann M.D.   On: 02/14/2020 16:23    Results for orders placed or performed during the hospital encounter of 02/06/20  Culture, bal-quantitative     Status: Abnormal   Collection Time: 02/10/20  2:54 PM   Specimen: Bronchoalveolar Lavage; Respiratory  Result Value Ref Range Status   Specimen Description BRONCHIAL ALVEOLAR LAVAGE  Final   Special Requests Normal  Final   Gram Stain   Final    FEW WBC PRESENT, PREDOMINANTLY PMN MODERATE YEAST Performed at [facility], [address], [place], [place] [address]    Culture 80,000 COLONIES/mL CANDIDA TROPICALIS (A)  Final   Report  Status 02/12/2020 FINAL  Final  Culture, blood (routine x 2)     Status: None (Preliminary result)   Collection Time: 02/14/20  9:48 AM   Specimen: BLOOD RIGHT HAND  Result Value Ref Range Status   Specimen Description BLOOD RIGHT HAND  Final   Special Requests   Final    BOTTLES DRAWN AEROBIC AND ANAEROBIC Blood Culture adequate volume   Culture   Final    NO GROWTH 2 DAYS Performed at [facility], [address] [place], [place] [address]    Report Status PENDING  Incomplete  Culture, blood (routine x 2)     Status: None (Preliminary result)   Collection Time: 02/14/20  9:54 AM   Specimen: BLOOD LEFT HAND  Result Value Ref Range Status   Specimen Description BLOOD LEFT HAND  Final   Special Requests   Final    BOTTLES DRAWN AEROBIC AND ANAEROBIC Blood Culture adequate volume   Culture   Final    NO GROWTH 2 DAYS Performed at [facility], [place] [address] [place], [place] [address]    Report Status PENDING  Incomplete   Vent Mode: CPAP;PSV FiO2 (%):  [40 %] 40 % Set Rate:  [18 bmp] 18 bmp Vt Set:  [570 mL] 570 mL PEEP:  [5  cmH20] 5 cmH20 Pressure Support:  [12 cmH20] 12 cmH20 Plateau Pressure:  [15 cmH20-17 cmH20] 16 cmH20 Assessment/Plan: S/P Procedure(s) (LRB): CORONARY ARTERY BYPASS GRAFTING (CABG),  x Four with Bilateral IMAs, and right leg greater saphenous vein (N/A) TRANSESOPHAGEAL ECHOCARDIOGRAM (TEE) (N/A) INDOCYANINE GREEN FLUORESCENCE IMAGING (ICG) (N/A) CAROTID ENDARTERECTOMY LEFT with  Patch Angioplasty Using Hemashield Platinum Finesse patch (Left)  1 VDRF- now with trach, as per PCCM, PSV, pPlat 16 2 apaced- sinus in 60's underneath, short PR interval- cont for now 3 BP a little soft at times in 90's, wean neo as able 4 amio change to per tube 5 on asa/plavix 6 conts unasyn, blood cx NGSF- some increase in leukocytosis, cont to monitor 7 H/H fairly stable 8 thrombocytopenia trend improving 9 cont potassium replacement   LOS: 10 days    Gaspar Bidding Pager [phone] 02/16/2020 Agree with notation. Continue support care. Menashe Kafer Z. Orvan Seen, [place]

## 2020-02-16 NOTE — Progress Notes (Signed)
NAME:  Tony Munoz, MRN:  [id number], DOB:  1956-05-13, LOS: 1 ADMISSION DATE:  02/06/2020, CONSULTATION DATE:  02/08/20 REFERRING MD:  Orvan Seen - CVTS, CHIEF COMPLAINT:  SOB, hypoxia   Brief History   64 yo M with CAD, HTN, COPD admit for elective CABG and L CEA, with progressive respiratory failure with hypoxia and hypercarbia and intubated on POD 3. Self extubated 6/6 and required re-intubation 6/7  Past Medical History  EtOH use  CAD  Anxiety Tobacco use disorder HTN   Significant Hospital Events   6/1 CABG and L CEA  6/2 receiving BZD for possible EtOH withdrawals. Was on precedex  6/3 worsening respiratory status with worsening hypoxia. Received 40 mg lasix. PCCM consulted 6/5 intubated and bronch with removal of aspirated pill on bronchus intermedius 6/6 Self extubated  6/7 Worsening hypoxia with respiratory distress and WOB. Reintubated 6/10 tracheostomy   Consults:  Vascular surgery PCCM   Procedures:  6/1 CABG 6/1 L CEA  6/10 tracheostomy   Significant Diagnostic Tests:  Bronch 6/5: mucus plugs bilaterally, blue pill in BI    Micro Data:  6/5 BAL-Candida tropicalis 6/9 blood culture   Antimicrobials:  Cefuroxime 6/1 (surg ppx)  vanc 6/1 Unasyn 6/4>>  Interim history/subjective:   Weaning on pressure support, minimal secretions  Objective   Blood pressure 91/61, pulse 78, temperature 98.3 F (36.8 C), temperature source Oral, resp. rate 20, height _0  (1.753 m), weight 58.3 kg, SpO2 90 %.    Vent Mode: CPAP;PSV FiO2 (%):  [40 %] 40 % Set Rate:  [18 bmp] 18 bmp Vt Set:  [570 mL] 570 mL PEEP:  [5 cmH20] 5 cmH20 Pressure Support:  [12 cmH20] 12 cmH20 Plateau Pressure:  [15 cmH20-17 cmH20] 16 cmH20   Intake/Output Summary (Last 24 hours) at 02/16/2020 0935 Last data filed at 02/16/2020 0800 Gross per 24 hour  Intake 2376.32 ml  Output 3295 ml  Net -918.68 ml   Filed Weights   02/13/20 0600 02/15/20 0500 02/16/20 0439  Weight: 58.4 kg 56.1 kg  58.3 kg    Examination:  General:  In bed on vent HENT: NCAT trach in place PULM: CTA B, vent supported breathing, midline scar present CV: RRR, no mgr GI: BS+, soft, nontender MSK: normal bulk and tone Neuro: awake on vent follows commands   Labs significant for potassium 2.8, BUN/creatinine 21/0.84 WBC 8.3, platelets 103  Assessment & Plan:   Acute metabolic encephalopathy > improved significantly - In the setting of hypoxia, hypercarbia, possible medication related in setting of CIWA protocol meds and post-op pain meds - EtOH use disorder with possible EtOH withdrawals  P: Wean off precedex Frequent orientation Continue thiamine, folate  Acute hypoxic and hypercarbic respiratory failure > improving Aspiration-right lower lobe foreign body found during bronchoscopy Hx tobacco use HAP Tracheostomy 6/10 P: Trach care per routine Continue chest PT, suctioning unasyn to continue Pressure support as long as tolerated today  CABG 6/1 L CEA 6/1 P: Management per TCTS ASA/Plavix  H/o HTN P: Monitor BP today  Afib P: amiodarone Asa tele  Thrombocytopenia > improved P: lovenox montior for bleeding  Cachexia, frailty P: Continue tube feeding PT efforts now that he has trach  Best practice:  Diet: Tube feeds Pain/Anxiety/Delirium protocol (if indicated): Precedex gtt > wean off VAP protocol (if indicated): In place DVT prophylaxis: per primary, post op  GI prophylaxis: protonix  Glucose control: SSI  Mobility: BR Code Status: Full  Family Communication: Per primary team. Disposition: ICU  Critical care time:    The patient is critically ill with multiple organ system failure and requires high complexity decision making for assessment and support, frequent evaluation and titration of therapies, advanced monitoring, review of radiographic studies and interpretation of complex data.   Critical Care Time devoted to patient care services, exclusive of  separately billable procedures, described in this note is 35 minutes.   Roselie Awkward, MD [facility] Pager: [phone] Cell: [phone] If no response, call [phone]  02/16/2020, 9:35 AM

## 2020-02-17 ENCOUNTER — Inpatient Hospital Stay (HOSPITAL_COMMUNITY): Payer: Medicare HMO

## 2020-02-17 DIAGNOSIS — J439 Emphysema, unspecified: Secondary | ICD-10-CM

## 2020-02-17 LAB — BASIC METABOLIC PANEL
Anion gap: 8 (ref 5–15)
Anion gap: 9 (ref 5–15)
BUN: 21 mg/dL (ref 8–23)
BUN: 26 mg/dL — ABNORMAL HIGH (ref 8–23)
CO2: 26 mmol/L (ref 22–32)
CO2: 26 mmol/L (ref 22–32)
Calcium: 7.4 mg/dL — ABNORMAL LOW (ref 8.9–10.3)
Calcium: 7.5 mg/dL — ABNORMAL LOW (ref 8.9–10.3)
Chloride: 105 mmol/L (ref 98–111)
Chloride: 104 mmol/L (ref 98–111)
Creatinine, Ser: 0.79 mg/dL (ref 0.61–1.24)
Creatinine, Ser: 0.88 mg/dL (ref 0.61–1.24)
GFR calc Af Amer: >60 mL/min (ref >60)
GFR calc Af Amer: >60 mL/min (ref >60)
GFR calc non Af Amer: >60 mL/min (ref >60)
GFR calc non Af Amer: >60 mL/min (ref >60)
Glucose, Bld: 132 mg/dL — ABNORMAL HIGH (ref 70–99)
Glucose, Bld: 140 mg/dL — ABNORMAL HIGH (ref 70–99)
Potassium: 3.5 mmol/L (ref 3.5–5.1)
Potassium: 3.8 mmol/L (ref 3.5–5.1)
Sodium: 139 mmol/L (ref 135–145)
Sodium: 139 mmol/L (ref 135–145)

## 2020-02-17 LAB — CBC
HCT: 25.4 % — ABNORMAL LOW (ref 39.0–52.0)
Hemoglobin: 8.2 g/dL — ABNORMAL LOW (ref 13.0–17.0)
MCH: 31.4 pg (ref 26.0–34.0)
MCHC: 32.3 g/dL (ref 30.0–36.0)
MCV: 97.3 fL (ref 80.0–100.0)
Platelets: 203 10*3/uL (ref 150–400)
RBC: 2.61 MIL/uL — ABNORMAL LOW (ref 4.22–5.81)
RDW: 14.7 % (ref 11.5–15.5)
WBC: 14.3 10*3/uL — ABNORMAL HIGH (ref 4.0–10.5)
nRBC: 0 % (ref 0.0–0.2)

## 2020-02-17 LAB — GLUCOSE, CAPILLARY
Glucose-Capillary: 77 mg/dL (ref 70–99)
Glucose-Capillary: 116 mg/dL — ABNORMAL HIGH (ref 70–99)
Glucose-Capillary: 123 mg/dL — ABNORMAL HIGH (ref 70–99)
Glucose-Capillary: 111 mg/dL — ABNORMAL HIGH (ref 70–99)
Glucose-Capillary: 123 mg/dL — ABNORMAL HIGH (ref 70–99)

## 2020-02-17 MED ORDER — BUDESONIDE 0.25 MG/2ML IN SUSP
0.2500 mg | Freq: Two times a day (BID) | RESPIRATORY_TRACT | Status: DC
  Administered 2020-02-17 – 2020-03-04 (×33): 0.25 mg via RESPIRATORY_TRACT
  Filled 2020-02-17 (×33): qty 2

## 2020-02-17 MED ORDER — ALBUTEROL SULFATE (2.5 MG/3ML) 0.083% IN NEBU
2.5000 mg | INHALATION_SOLUTION | RESPIRATORY_TRACT | Status: DC | PRN
  Administered 2020-02-19: 2.5 mg via RESPIRATORY_TRACT
  Filled 2020-02-17: qty 3

## 2020-02-17 MED ORDER — POTASSIUM CHLORIDE 20 MEQ/15ML (10%) PO SOLN
20.0000 meq | ORAL | Status: AC
  Administered 2020-02-17 (×3): 20 meq
  Filled 2020-02-17 (×3): qty 15

## 2020-02-17 MED ORDER — PHENYLEPHRINE HCL-NACL 10-0.9 MG/250ML-% IV SOLN
0.0000 ug/min | INTRAVENOUS | Status: DC
  Administered 2020-02-17: 10 ug/min via INTRAVENOUS
  Administered 2020-02-18: 5 ug/min via INTRAVENOUS
  Filled 2020-02-17 (×2): qty 250

## 2020-02-17 MED ORDER — REVEFENACIN 175 MCG/3ML IN SOLN
175.0000 ug | Freq: Every day | RESPIRATORY_TRACT | Status: DC
  Administered 2020-02-18 – 2020-03-04 (×16): 175 ug via RESPIRATORY_TRACT
  Filled 2020-02-17 (×18): qty 3

## 2020-02-17 MED ORDER — OXYCODONE HCL 5 MG PO TABS
5.0000 mg | ORAL_TABLET | ORAL | Status: DC | PRN
  Administered 2020-02-17 – 2020-02-19 (×12): 5 mg
  Filled 2020-02-17 (×12): qty 1

## 2020-02-17 MED ORDER — ARFORMOTEROL TARTRATE 15 MCG/2ML IN NEBU
15.0000 ug | INHALATION_SOLUTION | Freq: Two times a day (BID) | RESPIRATORY_TRACT | Status: DC
  Administered 2020-02-17 – 2020-03-04 (×33): 15 ug via RESPIRATORY_TRACT
  Filled 2020-02-17 (×33): qty 2

## 2020-02-17 NOTE — Progress Notes (Signed)
NAME:  Tony Munoz, MRN:  [id number], DOB:  10/18/1955, LOS: 22 ADMISSION DATE:  02/06/2020, CONSULTATION DATE:  02/08/20 REFERRING MD:  Orvan Seen - CVTS, CHIEF COMPLAINT:  SOB, hypoxia   Brief History   64 yo M with CAD, HTN, COPD admit for elective CABG and L CEA, with progressive respiratory failure with hypoxia and hypercarbia and intubated on POD 3. Self extubated 6/6 and required re-intubation 6/7  Past Medical History  EtOH use  CAD  Anxiety Tobacco use disorder HTN   Significant Hospital Events   6/1 CABG and L CEA  6/2 receiving BZD for possible EtOH withdrawals. Was on precedex  6/3 worsening respiratory status with worsening hypoxia. Received 40 mg lasix. PCCM consulted 6/5 intubated and bronch with removal of aspirated pill on bronchus intermedius 6/6 Self extubated  6/7 Worsening hypoxia with respiratory distress and WOB. Reintubated 6/10 tracheostomy   Consults:  Vascular surgery PCCM   Procedures:  6/1 CABG 6/1 L CEA  6/10 tracheostomy   Significant Diagnostic Tests:  Bronch 6/5: mucus plugs bilaterally, blue pill in BI    Micro Data:  6/5 BAL-Candida tropicalis 6/9 blood culture   Antimicrobials:  Cefuroxime 6/1 (surg ppx)  vanc 6/1 Unasyn 6/4>>  Interim history/subjective:   Weaning on pressure support, minimal secretions  Objective   Blood pressure (!) 82/61, pulse (!) 54, temperature 97.6 F (36.4 C), temperature source Oral, resp. rate 18, height _0  (1.753 m), weight 60.4 kg, SpO2 98 %.    Vent Mode: PRVC FiO2 (%):  [40 %] 40 % Set Rate:  [18 bmp] 18 bmp Vt Set:  [570 mL] 570 mL PEEP:  [5 cmH20] 5 cmH20 Pressure Support:  [12 cmH20] 12 cmH20 Plateau Pressure:  [15 cmH20-20 cmH20] 19 cmH20   Intake/Output Summary (Last 24 hours) at 02/17/2020 0931 Last data filed at 02/17/2020 0800 Gross per 24 hour  Intake 3262.27 ml  Output 3871 ml  Net -608.73 ml   Filed Weights   02/15/20 0500 02/16/20 0439 02/17/20 0500  Weight: 56.1 kg  58.3 kg 60.4 kg    Examination:  General: Resting comfortable in bed, alert following commands writes to communicate HENT: NCAT, trach in place PULM: Wheezing bilaterally present CV: Regular rate rhythm, no MRG GI: Soft nontender nondistended MSK: Normal bulk and tone Neuro: Awake on vent, follows commands moves all 4 extremities  Labs reviewed Chest x-ray reviewed.  Pneumothorax present on the left.  Tubes in place.  Discussed with CT surgery.  Assessment & Plan:     Acute hypoxic and hypercarbic respiratory failure > improving Aspiration-right lower lobe foreign body found during bronchoscopy, Candida tropicalis on BAL culture. Hx tobacco use HAP Severe bullous emphysema present on chest x-ray imaging Tracheostomy 6/10 Pneumothorax on left, tube in place P: Continue routine trach care Continue chest PT and pulmonary toileting Weaning to pressure support today. Would like to get to trach collar as soon as possible Scheduled nebulizers Stopped ampicillin sulbactam, completed 8 days antibiotics. Observe off antimicrobials.  Watch for any sign of fevers. Flush left chest drain.  May need to consider left-sided pigtail placement.  Discussed with cardiothoracic surgery.  Acute metabolic encephalopathy > resolved - In the setting of hypoxia, hypercarbia, possible medication related in setting of CIWA protocol meds and post-op pain meds - EtOH use disorder with possible EtOH withdrawals  P: Wean off continuous medications Continue thiamine folate  CABG 6/1 L CEA 6/1 P: Aspirin and Plavix, prior to CTS  H/o HTN P: Monitoring  BP  Afib P: Amiodarone, aspirin Telemetry  Thrombocytopenia > improved P: [place] continued Monitor for any sign of bleeding  Cachexia, frailty P: Continue tube feeding PT efforts now that he has trach  Best practice:  Diet: Tube feeds Pain/Anxiety/Delirium protocol (if indicated): Precedex gtt > wean off VAP protocol (if indicated):  In place DVT prophylaxis: per primary, post op  GI prophylaxis: protonix  Glucose control: SSI  Mobility: BR Code Status: Full  Family Communication: Per primary team. Disposition: ICU   Garner Nash, DO [facility] 02/17/2020 9:31 AM

## 2020-02-17 NOTE — Progress Notes (Signed)
      [address]       [name] [address]             [phone]      Failed attempt at PS wean earlier  Just had episode of tachypnea and tachycardia. Rn has noted thick bloody secretions.  After repositioning for CXR, he looks more comfortable with RR and HR back to baseline. CT in place, air leak from left unchanged from this AM.  Stat CXR shows left pneumo is smaller than on this morning's film. Unchanged opacity in right lung, possible increase AS disease in LLL.  Salvatore Decent Dorris Fetch, MD [facility] [phone]

## 2020-02-17 NOTE — Progress Notes (Signed)
eLink Physician-Brief Progress Note Patient Name: GLENMORE KARL DOB: [birth year] MRN: [id number]   Date of Service  02/17/2020  HPI/Events of Note  RN requests renewal of phenylephrine drip order.   eICU Interventions  Order placed.     Intervention Category Minor Interventions: Routine modifications to care plan (e.g. PRN medications for pain, fever)  Marveen Reeks Kailena Lubas 02/17/2020, 8:16 PM

## 2020-02-17 NOTE — Progress Notes (Signed)
Attempted to wean patient on .40 trach collar.  Pt tolerated trach collar x 5 minutes before desaturating to 85%.  Increased Fio2 to .50 and o2 saturation improved to 91%.  Pt complained of dyspnea throughout the trial.  Trial was ended and patient placed on the ventilator in [facility] mode.

## 2020-02-17 NOTE — Progress Notes (Signed)
11 Days Post-Op Procedure(s) (LRB): CORONARY ARTERY BYPASS GRAFTING (CABG),  x Four with Bilateral IMAs, and right leg greater saphenous vein (N/A) TRANSESOPHAGEAL ECHOCARDIOGRAM (TEE) (N/A) INDOCYANINE GREEN FLUORESCENCE IMAGING (ICG) (N/A) CAROTID ENDARTERECTOMY LEFT with  Patch Angioplasty Using Hemashield Platinum Finesse patch (Left) Subjective: C/o pain at incision, wants to know when he can go home  Objective: Vital signs in last 24 hours: Temp:  [97.6 F (36.4 C)-98.8 F (37.1 C)] 97.6 F (36.4 C) (06/12 0727) Pulse Rate:  [56-94] 56 (06/12 0630) Cardiac Rhythm: Normal sinus rhythm;Sinus bradycardia (06/12 0400) Resp:  [16-37] 24 (06/12 0630) BP: (73-143)/(53-87) 100/65 (06/12 0630) SpO2:  [90 %-100 %] 94 % (06/12 0756) FiO2 (%):  [40 %] 40 % (06/12 0756) Weight:  [60.4 kg] 60.4 kg (06/12 0500)  Hemodynamic parameters for last 24 hours:    Intake/Output from previous day: 06/11 0701 - 06/12 0700 In: 3179.5 [I.V.:1333.8; NG/GT:1155; IV Piggyback:690.7] Out: 7628 [Urine:3075; Stool:600; Chest Tube:196] Intake/Output this shift: No intake/output data recorded.  General appearance: alert, cooperative and no distress Neurologic: intact Heart: regular rate and rhythm Lungs: wheezes bilaterally Abdomen: normal findings: soft, non-tender + air leak on Left  Lab Results: Recent Labs    02/16/20 0750 02/17/20 0500  WBC 12.2* 14.3*  HGB 8.4* 8.2*  HCT 26.0* 25.4*  PLT 142* 203   BMET:  Recent Labs    02/16/20 1549 02/17/20 0500  NA 138 139  K 3.6 3.5  CL 104 105  CO2 27 26  GLUCOSE 128* 132*  BUN 20 21  CREATININE 0.78 0.79  CALCIUM 7.5* 7.4*    PT/INR: No results for input(s): LABPROT, INR in the last 72 hours. ABG    Component Value Date/Time   PHART 7.456 (H) 02/12/2020 1253   HCO3 29.0 (H) 02/12/2020 1253   TCO2 30 02/12/2020 1253   ACIDBASEDEF 3.0 (H) 02/06/2020 2222   O2SAT 88.0 02/12/2020 1253   CBG (last 3)  Recent Labs    02/16/20 2343  02/17/20 0333 02/17/20 0740  GLUCAP 130* 77 116*    Assessment/Plan: S/P Procedure(s) (LRB): CORONARY ARTERY BYPASS GRAFTING (CABG),  x Four with Bilateral IMAs, and right leg greater saphenous vein (N/A) TRANSESOPHAGEAL ECHOCARDIOGRAM (TEE) (N/A) INDOCYANINE GREEN FLUORESCENCE IMAGING (ICG) (N/A) CAROTID ENDARTERECTOMY LEFT with  Patch Angioplasty Using Hemashield Platinum Finesse patch (Left) -CV- in SR, BP remains 85-100 systolic on neo at 40  RESP_ VDRF- trach with some bloody secretions  Left pneumothorax on CXR this AM- has a pleural tube in place with an air leak currently. No air leak on right   Continue to try to wean  ID- on Unasyn for pneumonia  RENAL- creatinine normal, supplement K  ENDO- CBG well controlled  Protein calorie malnutrition- tube feeds   LOS: 11 days    Loreli Slot 02/17/2020

## 2020-02-18 ENCOUNTER — Inpatient Hospital Stay (HOSPITAL_COMMUNITY): Payer: Medicare HMO

## 2020-02-18 LAB — BASIC METABOLIC PANEL
Anion gap: 7 (ref 5–15)
BUN: 27 mg/dL — ABNORMAL HIGH (ref 8–23)
CO2: 27 mmol/L (ref 22–32)
Calcium: 7.5 mg/dL — ABNORMAL LOW (ref 8.9–10.3)
Chloride: 105 mmol/L (ref 98–111)
Creatinine, Ser: 0.8 mg/dL (ref 0.61–1.24)
GFR calc Af Amer: >60 mL/min (ref >60)
GFR calc non Af Amer: >60 mL/min (ref >60)
Glucose, Bld: 123 mg/dL — ABNORMAL HIGH (ref 70–99)
Potassium: 3.5 mmol/L (ref 3.5–5.1)
Sodium: 139 mmol/L (ref 135–145)

## 2020-02-18 LAB — GLUCOSE, CAPILLARY
Glucose-Capillary: 101 mg/dL — ABNORMAL HIGH (ref 70–99)
Glucose-Capillary: 114 mg/dL — ABNORMAL HIGH (ref 70–99)
Glucose-Capillary: 124 mg/dL — ABNORMAL HIGH (ref 70–99)
Glucose-Capillary: 87 mg/dL (ref 70–99)
Glucose-Capillary: 88 mg/dL (ref 70–99)
Glucose-Capillary: 95 mg/dL (ref 70–99)

## 2020-02-18 LAB — CBC
HCT: 22.8 % — ABNORMAL LOW (ref 39.0–52.0)
Hemoglobin: 7.3 g/dL — ABNORMAL LOW (ref 13.0–17.0)
MCH: 31.2 pg (ref 26.0–34.0)
MCHC: 32 g/dL (ref 30.0–36.0)
MCV: 97.4 fL (ref 80.0–100.0)
Platelets: 227 10*3/uL (ref 150–400)
RBC: 2.34 MIL/uL — ABNORMAL LOW (ref 4.22–5.81)
RDW: 14.7 % (ref 11.5–15.5)
WBC: 8.8 10*3/uL (ref 4.0–10.5)
nRBC: 0 % (ref 0.0–0.2)

## 2020-02-18 MED ORDER — POTASSIUM CHLORIDE 20 MEQ/15ML (10%) PO SOLN
20.0000 meq | ORAL | Status: AC
  Administered 2020-02-18 (×3): 20 meq
  Filled 2020-02-18 (×3): qty 15

## 2020-02-18 NOTE — Progress Notes (Signed)
° °   °  [address]       [name] [address]             [phone]      Short period on PS earlier, but on PRVC most of day  Air leak persists on left side  BP (!) 75/62    Pulse 80    Temp 97.6 F (36.4 C) (Oral)    Resp 16    Ht 5\' 9"  (1.753 m)    Wt 56.2 kg    SpO2 100%    BMI 18.30 kg/m   BP currently 116/72  Intake/Output Summary (Last 24 hours) at 02/18/2020 1936 Last data filed at 02/18/2020 1800 Gross per 24 hour  Intake 2656.99 ml  Output 3150 ml  Net -493.01 ml    Continue current Rx  Nisaiah Bechtol C. 02/20/2020, MD [facility] [phone]

## 2020-02-18 NOTE — Progress Notes (Signed)
NAME:  Tony Munoz, MRN:  [id number], DOB:  24-Nov-1955, LOS: 70 ADMISSION DATE:  02/06/2020, CONSULTATION DATE:  02/08/20 REFERRING MD:  Orvan Seen - CVTS, CHIEF COMPLAINT:  SOB, hypoxia   Brief History   64 yo M with CAD, HTN, COPD admit for elective CABG and L CEA, with progressive respiratory failure with hypoxia and hypercarbia and intubated on POD 3. Self extubated 6/6 and required re-intubation 6/7  Past Medical History  EtOH use  CAD  Anxiety Tobacco use disorder HTN   Significant Hospital Events   6/1 CABG and L CEA  6/2 receiving BZD for possible EtOH withdrawals. Was on precedex  6/3 worsening respiratory status with worsening hypoxia. Received 40 mg lasix. PCCM consulted 6/5 intubated and bronch with removal of aspirated pill on bronchus intermedius 6/6 Self extubated  6/7 Worsening hypoxia with respiratory distress and WOB. Reintubated 6/10 tracheostomy   Consults:  Vascular surgery PCCM   Procedures:  6/1 CABG 6/1 L CEA  6/10 tracheostomy   Significant Diagnostic Tests:  Bronch 6/5: mucus plugs bilaterally, blue pill in BI    Micro Data:  6/5 BAL-Candida tropicalis 6/9 blood culture   Antimicrobials:  Cefuroxime 6/1 (surg ppx)  vanc 6/1 Unasyn 6/4>>  Interim history/subjective:   Weaned on pressure support for approximately 1 hour this morning became very tachypneic and tired. Placed back to mechanical support. No issues with vent support at this time.  Objective   Blood pressure (!) 99/59, pulse 69, temperature 97.8 F (36.6 C), resp. rate (!) 26, height _0  (1.753 m), weight 56.2 kg, SpO2 100 %. CVP:  [4 mmHg] 4 mmHg  Vent Mode: PRVC FiO2 (%):  [40 %-100 %] 50 % Set Rate:  [18 bmp] 18 bmp Vt Set:  [570 mL] 570 mL PEEP:  [5 cmH20] 5 cmH20 Pressure Support:  [10 cmH20] 10 cmH20 Plateau Pressure:  [10 cmH20-19 cmH20] 18 cmH20   Intake/Output Summary (Last 24 hours) at 02/18/2020 1027 Last data filed at 02/18/2020 4132 Gross per 24 hour  Intake  2372.42 ml  Output 2020 ml  Net 352.42 ml   Filed Weights   02/16/20 0439 02/17/20 0500 02/18/20 0500  Weight: 58.3 kg 60.4 kg 56.2 kg    Examination:  General: Resting comfortable in bed, awake alert writes on pad to communicate HENT: AT, trach in place PULM: No wheezing CV: Regular rate rhythm, S1-S2 no MRG GI: Soft, nontender nondistended MSK: Normal bulk and tone Neuro: Awake alert following commands  Labs reviewed Chest x-ray reviewed. Chest x-ray stable today, improvement of left sided pneumothorax The patient's images have been independently reviewed by me.    Assessment & Plan:   Acute hypoxic and hypercarbic respiratory failure > improving Aspiration-right lower lobe foreign body found during bronchoscopy, Candida tropicalis on BAL culture. Hx tobacco use HAP Severe bullous emphysema present on chest x-ray imaging Tracheostomy 6/10 Pneumothorax on left, tube in place P: -Continue routine tracheostomy care Continue chest PT and pulmonary toileting Attempt pressure support trials as much as tolerated Would like to get to trach collar trial as soon as possible Continue scheduled nebulizers for COPD management Remains off antimicrobials and continue to observe for any infectious signs Left chest tube pigtail management per cardiothoracic surgery.  Acute metabolic encephalopathy > resolved - In the setting of hypoxia, hypercarbia, possible medication related in setting of CIWA protocol meds and post-op pain meds - EtOH use disorder with possible EtOH withdrawals  P: Remains off of continuous medications Continue thiamine folate  CABG 6/1 L CEA 6/1 P: Aspirin plus Plavix  H/o HTN P: BP monitoring  Afib P: Amiodarone Telemetry  Thrombocytopenia > improved P: Continue Lovenox Monitor for any sign of bleeding  Cachexia, frailty P: Continue PT efforts This will improve his chances of coming off continuous vent support.  Best practice:  Diet:  Tube feeds Pain/Anxiety/Delirium protocol (if indicated): Precedex gtt > wean off VAP protocol (if indicated): In place DVT prophylaxis: per primary, post op  GI prophylaxis: protonix  Glucose control: SSI  Mobility: BR Code Status: Full  Family Communication: Per primary team. Disposition: ICU   Garner Nash, DO [facility] 02/18/2020 10:27 AM

## 2020-02-18 NOTE — Progress Notes (Signed)
12 Days Post-Op Procedure(s) (LRB): CORONARY ARTERY BYPASS GRAFTING (CABG),  x Four with Bilateral IMAs, and right leg greater saphenous vein (N/A) TRANSESOPHAGEAL ECHOCARDIOGRAM (TEE) (N/A) INDOCYANINE GREEN FLUORESCENCE IMAGING (ICG) (N/A) CAROTID ENDARTERECTOMY LEFT with  Patch Angioplasty Using Hemashield Platinum Finesse patch (Left) Subjective: On PS currently, denies shortness of breath  Objective: Vital signs in last 24 hours: Temp:  [97.8 F (36.6 C)-99.3 F (37.4 C)] 97.8 F (36.6 C) (06/13 0813) Pulse Rate:  [25-114] 78 (06/13 0731) Cardiac Rhythm: Normal sinus rhythm (06/13 0400) Resp:  [16-39] 27 (06/13 0731) BP: (76-167)/(50-118) 102/81 (06/13 0731) SpO2:  [79 %-100 %] 100 % (06/13 0731) FiO2 (%):  [40 %-100 %] 50 % (06/13 0731) Weight:  [56.2 kg] 56.2 kg (06/13 0500)  Hemodynamic parameters for last 24 hours: CVP:  [4 mmHg] 4 mmHg  Intake/Output from previous day: 06/12 0701 - 06/13 0700 In: 2696.5 [I.V.:1134.7; ZT/IW:5809; IV Piggyback:36.8] Out: 2020 [Urine:1650; Chest Tube:370] Intake/Output this shift: No intake/output data recorded.  General appearance: alert, cooperative and no distress Neurologic: intact Heart: regular rate and rhythm Lungs: coarse BS bilaterally Abdomen: normal findings: soft, non-tender + air leak from left  Lab Results: Recent Labs    02/17/20 0500 02/18/20 0500  WBC 14.3* 8.8  HGB 8.2* 7.3*  HCT 25.4* 22.8*  PLT 203 227   BMET:  Recent Labs    02/17/20 1854 02/18/20 0500  NA 139 139  K 3.8 3.5  CL 104 105  CO2 26 27  GLUCOSE 140* 123*  BUN 26* 27*  CREATININE 0.88 0.80  CALCIUM 7.5* 7.5*    PT/INR: No results for input(s): LABPROT, INR in the last 72 hours. ABG    Component Value Date/Time   PHART 7.456 (H) 02/12/2020 1253   HCO3 29.0 (H) 02/12/2020 1253   TCO2 30 02/12/2020 1253   ACIDBASEDEF 3.0 (H) 02/06/2020 2222   O2SAT 88.0 02/12/2020 1253   CBG (last 3)  Recent Labs    02/18/20 0018  02/18/20 0314 02/18/20 0731  GLUCAP 95 88 101*    Assessment/Plan: S/P Procedure(s) (LRB): CORONARY ARTERY BYPASS GRAFTING (CABG),  x Four with Bilateral IMAs, and right leg greater saphenous vein (N/A) TRANSESOPHAGEAL ECHOCARDIOGRAM (TEE) (N/A) INDOCYANINE GREEN FLUORESCENCE IMAGING (ICG) (N/A) CAROTID ENDARTERECTOMY LEFT with  Patch Angioplasty Using Hemashield Platinum Finesse patch (Left) -CV- down to 10 mcg on Neo  RESP- VDRF- didn't tolerate PS yesterday but has been on for almost an hour this AM  Left pneumothorax smaller, air leak still present  Pneumonia- on Unasyn  RENAL- creatinine Ok  ENDO- CBG well controlled  Protein calorie malnutrition- on goal TF  Anemia- Hct down to 23, will get T & S with AM labs tomorrow   LOS: 12 days    Loreli Slot 02/18/2020

## 2020-02-19 ENCOUNTER — Ambulatory Visit: Payer: Medicare HMO | Admitting: Cardiothoracic Surgery

## 2020-02-19 LAB — COMPREHENSIVE METABOLIC PANEL
ALT: 100 U/L — ABNORMAL HIGH (ref 0–44)
AST: 74 U/L — ABNORMAL HIGH (ref 15–41)
Albumin: 2.1 g/dL — ABNORMAL LOW (ref 3.5–5.0)
Alkaline Phosphatase: 76 U/L (ref 38–126)
Anion gap: 8 (ref 5–15)
BUN: 30 mg/dL — ABNORMAL HIGH (ref 8–23)
CO2: 27 mmol/L (ref 22–32)
Calcium: 7.8 mg/dL — ABNORMAL LOW (ref 8.9–10.3)
Chloride: 104 mmol/L (ref 98–111)
Creatinine, Ser: 0.84 mg/dL (ref 0.61–1.24)
GFR calc Af Amer: >60 mL/min (ref >60)
GFR calc non Af Amer: >60 mL/min (ref >60)
Glucose, Bld: 116 mg/dL — ABNORMAL HIGH (ref 70–99)
Potassium: 3.9 mmol/L (ref 3.5–5.1)
Sodium: 139 mmol/L (ref 135–145)
Total Bilirubin: 0.8 mg/dL (ref 0.3–1.2)
Total Protein: 5.1 g/dL — ABNORMAL LOW (ref 6.5–8.1)

## 2020-02-19 LAB — GLUCOSE, CAPILLARY
Glucose-Capillary: 109 mg/dL — ABNORMAL HIGH (ref 70–99)
Glucose-Capillary: 111 mg/dL — ABNORMAL HIGH (ref 70–99)
Glucose-Capillary: 114 mg/dL — ABNORMAL HIGH (ref 70–99)
Glucose-Capillary: 117 mg/dL — ABNORMAL HIGH (ref 70–99)
Glucose-Capillary: 136 mg/dL — ABNORMAL HIGH (ref 70–99)
Glucose-Capillary: 89 mg/dL (ref 70–99)
Glucose-Capillary: 92 mg/dL (ref 70–99)

## 2020-02-19 LAB — CBC
HCT: 25 % — ABNORMAL LOW (ref 39.0–52.0)
Hemoglobin: 7.9 g/dL — ABNORMAL LOW (ref 13.0–17.0)
MCH: 30.7 pg (ref 26.0–34.0)
MCHC: 31.6 g/dL (ref 30.0–36.0)
MCV: 97.3 fL (ref 80.0–100.0)
Platelets: 276 10*3/uL (ref 150–400)
RBC: 2.57 MIL/uL — ABNORMAL LOW (ref 4.22–5.81)
RDW: 15 % (ref 11.5–15.5)
WBC: 8.7 10*3/uL (ref 4.0–10.5)
nRBC: 0 % (ref 0.0–0.2)

## 2020-02-19 LAB — CULTURE, BLOOD (ROUTINE X 2)
Culture: NO GROWTH
Culture: NO GROWTH
Special Requests: ADEQUATE
Special Requests: ADEQUATE

## 2020-02-19 MED ORDER — QUETIAPINE FUMARATE 25 MG PO TABS: 25.0000 mg | ORAL_TABLET | Freq: Every day | ORAL | Status: DC

## 2020-02-19 MED ORDER — ADULT MULTIVITAMIN W/MINERALS CH
1.0000 | ORAL_TABLET | Freq: Every day | ORAL | Status: DC
  Administered 2020-02-20 – 2020-02-22 (×3): 1
  Filled 2020-02-19 (×3): qty 1

## 2020-02-19 MED ORDER — PRO-STAT SUGAR FREE PO LIQD
30.0000 mL | Freq: Every day | ORAL | Status: DC
  Administered 2020-02-19 – 2020-02-22 (×4): 30 mL
  Filled 2020-02-19 (×4): qty 30

## 2020-02-19 MED ORDER — QUETIAPINE FUMARATE 25 MG PO TABS
25.0000 mg | ORAL_TABLET | Freq: Every day | ORAL | Status: DC
  Administered 2020-02-19: 25 mg
  Filled 2020-02-19: qty 1

## 2020-02-19 MED ORDER — LORAZEPAM 2 MG/ML IJ SOLN
0.5000 mg | INTRAMUSCULAR | Status: DC | PRN
  Administered 2020-02-20: 1 mg via INTRAVENOUS
  Filled 2020-02-19 (×2): qty 1

## 2020-02-19 NOTE — Progress Notes (Signed)
NAME:  Tony Munoz, MRN:  [id number], DOB:  04/11/1956, LOS: 36 ADMISSION DATE:  02/06/2020, CONSULTATION DATE:  02/08/20 REFERRING MD:  Orvan Seen - CVTS, CHIEF COMPLAINT:  SOB, hypoxia   Brief History   64 yo M with CAD, HTN, COPD admit for elective CABG and L CEA, with progressive respiratory failure with hypoxia and hypercarbia and intubated on POD 3. Self extubated 6/6 and required re-intubation 6/7  Past Medical History  EtOH use  CAD  Anxiety Tobacco use disorder HTN   Significant Hospital Events   6/1 CABG and L CEA  6/2 receiving BZD for possible EtOH withdrawals. Was on precedex  6/3 worsening respiratory status with worsening hypoxia. Received 40 mg lasix. PCCM consulted 6/5 intubated and bronch with removal of aspirated pill on bronchus intermedius 6/6 Self extubated  6/7 Worsening hypoxia with respiratory distress and WOB. Reintubated 6/10 tracheostomy  6/12-6/14 - PS weaning trials   Consults:  Vascular surgery PCCM   Procedures:  6/1 CABG 6/1 L CEA  6/10 tracheostomy   Significant Diagnostic Tests:  Bronch 6/5: mucus plugs bilaterally, blue pill in BI    Micro Data:  6/5 BAL-Candida tropicalis 6/9 blood culture   Antimicrobials:  Cefuroxime 6/1 (surg ppx)  vanc 6/1 Unasyn 6/4>>  Interim history/subjective:   Comfortable resting on mechanical support tracheostomy tube in place.  No issues overnight.  Objective   Blood pressure 98/61, pulse 90, temperature 98.2 F (36.8 C), temperature source Oral, resp. rate (!) 25, height _0  (1.753 m), weight 56 kg, SpO2 100 %.    Vent Mode: PRVC FiO2 (%):  [40 %] 40 % Set Rate:  [18 bmp] 18 bmp Vt Set:  [570 mL] 570 mL PEEP:  [5 cmH20] 5 cmH20 Plateau Pressure:  [16 cmH20-28 cmH20] 28 cmH20   Intake/Output Summary (Last 24 hours) at 02/19/2020 1006 Last data filed at 02/19/2020 0600 Gross per 24 hour  Intake 1775.77 ml  Output 3780 ml  Net -2004.23 ml   Filed Weights   02/17/20 0500 02/18/20 0500  02/19/20 0400  Weight: 60.4 kg 56.2 kg 56 kg    Examination:  General: Elderly cachectic male, resting in bed, tracheostomy tube in place, communicates by writing HENT: NCAT, trach in place no significant bleeding or secretions PULM: Bilateral mechanically ventilated breaths CV: Regular rate rhythm, S1-S2 GI: Soft, nontender, nondistended MSK: Normal bulk and tone Neuro: Wake alert following commands  Labs reviewed Chest x-ray reviewed, stable  Assessment & Plan:   Acute hypoxic and hypercarbic respiratory failure > improving Aspiration-right lower lobe foreign body found during bronchoscopy, Candida tropicalis on BAL culture. Hx tobacco use HAP Severe bullous emphysema present on chest x-ray imaging Tracheostomy 6/10 Pneumothorax on left, tube in place P: -Continue routine tracheostomy care -Continue chest PT and pulmonary toileting -Continue pressure support trials as tolerated -He tolerated this for only about an hour yesterday. -Continue scheduled nebulizers for COPD management -Remains off antimicrobials continue observe for any signs of infection. -Would like to get him to trach collar trial soon as possible. -Discussed management plans with respiratory therapy. Chest tube management per cardiothoracic surgery.  Acute metabolic encephalopathy > resolved - In the setting of hypoxia, hypercarbia, possible medication related in setting of CIWA protocol meds and post-op pain meds - EtOH use disorder with possible EtOH withdrawals  P: Remains off continuous medications at this time  Continue thiamine plus folate  CABG 6/1 L CEA 6/1 P: Aspirin plus Plavix  H/o HTN Afib Thrombocytopenia > improved Cachexia,  frailty - continue PT   Best practice:  Diet: Tube feeds Pain/Anxiety/Delirium protocol (if indicated): n/a  VAP protocol (if indicated): In place DVT prophylaxis: per primary, post op  GI prophylaxis: protonix  Glucose control: SSI  Mobility: BR Code  Status: Full  Family Communication: Per primary team. Disposition: ICU   Garner Nash, DO [facility] 02/19/2020 10:06 AM

## 2020-02-19 NOTE — Progress Notes (Signed)
eLink Physician-Brief Progress Note Patient Name: Tony Munoz DOB: 1956/06/20 MRN: [id number]   Date of Service  02/19/2020  HPI/Events of Note  Patient very tachypneic in setting of mucus plugs. Ventilated by trach. Already on maximum dose of Precedex. Wide awake and anxious.  RT suctioned large clots and plugs with some improvement in tachypnea and respiratory effort.   eICU Interventions  Recommend airway clearance therapy such as metanebs today by RT.  Will also Rx Ativan 0.5-1mg  IV Q4H PRN anxiety to help with dyspnea /breathlessness.     Intervention Category Intermediate Interventions: Respiratory distress - evaluation and management Minor Interventions: Agitation / anxiety - evaluation and management  Marveen Reeks Shizuye Rupert 02/19/2020, 7:02 AM

## 2020-02-19 NOTE — Progress Notes (Addendum)
[address]       [place] [address]             [phone]      13 Days Post-Op Procedure(s) (LRB): CORONARY ARTERY BYPASS GRAFTING (CABG),  x Four with Bilateral IMAs, and right leg greater saphenous vein (N/A) TRANSESOPHAGEAL ECHOCARDIOGRAM (TEE) (N/A) INDOCYANINE GREEN FLUORESCENCE IMAGING (ICG) (N/A) CAROTID ENDARTERECTOMY LEFT with  Patch Angioplasty Using Hemashield Platinum Finesse patch (Left) Subjective: More alert and active in bed. Writes frequent notes for communication  Objective: Vital signs in last 24 hours: Temp:  [97.6 F (36.4 C)-98.2 F (36.8 C)] 98.2 F (36.8 C) (06/14 0745) Pulse Rate:  [29-118] 90 (06/14 4627) Cardiac Rhythm: Normal sinus rhythm (06/14 0400) Resp:  [15-35] 25 (06/14 0613) BP: (75-247)/(49-219) 98/61 (06/14 0613) SpO2:  [87 %-100 %] 100 % (06/14 0732) FiO2 (%):  [40 %-50 %] 40 % (06/14 0732) Weight:  [56 kg] 56 kg (06/14 0400)  Hemodynamic parameters for last 24 hours:    Intake/Output from previous day: 06/13 0701 - 06/14 0700 In: 2213.5 [I.V.:668.5; NG/GT:1545] Out: 0350 [Urine:2050; Stool:1400; Chest Tube:330] Intake/Output this shift: No intake/output data recorded.  General appearance: alert, cooperative and no distress Heart: regular rate and rhythm and paced Lungs: coarse Abdomen: benign Extremities: no edema Wound: healing well, echymosis slowly improving  Lab Results: Recent Labs    02/18/20 0500 02/19/20 0308  WBC 8.8 8.7  HGB 7.3* 7.9*  HCT 22.8* 25.0*  PLT 227 276   BMET:  Recent Labs    02/18/20 0500 02/19/20 0308  NA 139 139  K 3.5 3.9  CL 105 104  CO2 27 27  GLUCOSE 123* 116*  BUN 27* 30*  CREATININE 0.80 0.84  CALCIUM 7.5* 7.8*    PT/INR: No results for input(s): LABPROT, INR in the last 72 hours. ABG    Component Value Date/Time   PHART 7.456 (H) 02/12/2020 1253   HCO3 29.0 (H) 02/12/2020 1253   TCO2 30 02/12/2020 1253   ACIDBASEDEF 3.0 (H) 02/06/2020 2222    O2SAT 88.0 02/12/2020 1253   CBG (last 3)  Recent Labs    02/19/20 0002 02/19/20 0319 02/19/20 0742  GLUCAP 111* 117* 136*   Vent Mode: PRVC FiO2 (%):  [40 %-50 %] 40 % Set Rate:  [18 bmp] 18 bmp Vt Set:  [570 mL] 570 mL PEEP:  [5 cmH20] 5 cmH20 Plateau Pressure:  [16 cmH20-28 cmH20] 28 cmH20    Meds Scheduled Meds: . amiodarone  200 mg Per Tube BID  . arformoterol  15 mcg Nebulization BID  . aspirin  81 mg Per Tube Daily  . atorvastatin  10 mg Per Tube Daily  . bisacodyl  10 mg Oral Daily   Or  . bisacodyl  10 mg Rectal Daily  . budesonide (PULMICORT) nebulizer solution  0.25 mg Nebulization BID  . chlorhexidine gluconate (MEDLINE KIT)  15 mL Mouth Rinse BID  . Chlorhexidine Gluconate Cloth  6 each Topical Daily  . clopidogrel  75 mg Per Tube Daily  . colchicine  0.6 mg Per Tube BID  . docusate  200 mg Per Tube Daily  . enoxaparin (LOVENOX) injection  40 mg Subcutaneous Daily  . folic acid  1 mg Intravenous Daily  . furosemide  40 mg Intravenous BID  . guaiFENesin  15 mL Per Tube Q6H  . insulin aspart  0-24 Units Subcutaneous Q4H  . mouth rinse  15 mL Mouth Rinse 10 times per  day  . multivitamin  15 mL Per Tube Daily  . pantoprazole (PROTONIX) IV  40 mg Intravenous Q24H  . polyethylene glycol  17 g Per Tube Daily  . revefenacin  175 mcg Nebulization Daily  . sodium chloride flush  10-40 mL Intracatheter Q12H  . sodium chloride flush  3 mL Intravenous Q12H  . thiamine  100 mg Per Tube Daily   Continuous Infusions: . sodium chloride Stopped (02/07/20 1415)  . sodium chloride    . sodium chloride 20 mL/hr at 02/18/20 1800  . dexmedetomidine (PRECEDEX) IV infusion 1.2 mcg/kg/hr (02/19/20 0600)  . feeding supplement (VITAL AF 1.2 CAL) 1,000 mL (02/19/20 0507)  . lactated ringers    . lactated ringers 20 mL/hr at 02/08/20 1831  . phenylephrine (NEO-SYNEPHRINE) Adult infusion Stopped (02/18/20 2230)   PRN Meds:.sodium chloride, albuterol, lactated ringers,  LORazepam, metoprolol tartrate, ondansetron (ZOFRAN) IV, oxyCODONE, sodium chloride flush, sodium chloride flush  Xrays DG Chest Port 1 View  Result Date: 02/18/2020 CLINICAL DATA:  Left-sided pneumothorax. EXAM: PORTABLE CHEST 1 VIEW COMPARISON:  February 17, 2020 FINDINGS: Tracheostomy catheter tip is 8.6 cm above the carina. Central catheter tip is in the superior vena cava. Feeding tube tip is below the diaphragm. There is a chest tube on each side. The previously noted left apical and apicolateral pneumothorax is essentially stable in size compared to 1 day prior. No tension component. No pneumothorax is seen on the right; there is apparent bullous disease in the right upper lobe. Areas of airspace opacity in the right mid lung and left lower lung regions remains stable. Left upper lobe atelectasis is stable. Generalized interstitial thickening is likewise stable. No new opacity evident. Heart size and pulmonary vascular within normal limits. Postoperative changes noted. No adenopathy appreciable. IMPRESSION: Persistent apical and apicolateral pneumothorax on the left with chest tube positioning on the left unchanged. Chest tube on the right present without appreciable pneumothorax. Other tube and catheter positions as described. Airspace opacity in the right mid lung and left base regions remain. Interstitial thickening throughout the lungs is stable. Stable cardiac silhouette. Electronically Signed   By: Lowella Grip III M.D.   On: 02/18/2020 08:02   DG CHEST PORT 1 VIEW  Result Date: 02/17/2020 CLINICAL DATA:  Shortness of.  Chest tubes present. EXAM: PORTABLE CHEST 1 VIEW COMPARISON:  Earlier today. FINDINGS: Decreased size of left pneumothorax, small to moderate persisting visualized inferior to the posterior third rib. Left-sided chest tube in place at the lung base. Persistent patchy opacity at the left lung base. Right chest tube in place. Paucity of lung markings at the apex related to  emphysema but no visualized pleural line or pneumothorax. Persistent opacity in the right mid lung, unchanged. Right subclavian central line in place. Tracheostomy tube in weighted enteric tube in place. Underlying hyperinflation and emphysema. Post median sternotomy with mid skin staples. Normal heart size. IMPRESSION: 1. Decreased size of left pneumothorax, small to moderate persisting inferior to the posterior left third rib. Left chest tube in place. 2. Unchanged patchy opacity at the left lung base and right mid lung. Background emphysema. 3. Right chest tube in place.  No visualized right pneumothorax. Electronically Signed   By: Keith Rake M.D.   On: 02/17/2020 18:07    Assessment/Plan: S/P Procedure(s) (LRB): CORONARY ARTERY BYPASS GRAFTING (CABG),  x Four with Bilateral IMAs, and right leg greater saphenous vein (N/A) TRANSESOPHAGEAL ECHOCARDIOGRAM (TEE) (N/A) INDOCYANINE GREEN FLUORESCENCE IMAGING (ICG) (N/A) CAROTID ENDARTERECTOMY LEFT with  Patch  Angioplasty Using Hemashield Platinum Finesse patch (Left)  1 stable hemodynamics, a paced ,  VDRF- PRVC, some mucous plugging issues, anxious at times. Trach in place-multiple nebs,  management as per PCCM - PS as tolerated trials 2 air leak + , both CT's, small on right- keep in place 3 no abx currently, no fevers or leukocytosis 5 H/H improved 6 renal function normal 7 conts TF's for nutrition, LFT's mildly elev, albumin low 8 therapies as able 9 BS controlled  LOS: 13 days    John Giovanni PA-C Pager [phone] 02/19/2020 Pt seen and examined; agree with notation. Niomi Valent Z. Orvan Seen, [place]

## 2020-02-19 NOTE — Progress Notes (Signed)
Physical Therapy Treatment Patient Details Name: Tony Munoz MRN: [id number] DOB: 1956/05/14 Today's Date: 02/19/2020    History of Present Illness 64 y.o male presented for surgery 02/06/20 for CABGx4 , bilateral IMA, carotid endartectomy. Complications from ETOH, worsening respiratory function requiring bipap. Extubated 06/02, off bipap 06/04. 6/5 pt re-intubated d/t bil mucus plugs, bronch with removal of aspirated pill on bronchus intermedius, pt self-extubated 6/6, re-intubated 6/7 due to worsening hypoxia with respiratory distress and WOB. PMHx includes radiculopathy, HTN, dyspnea, smoker, carotid artery occulsion, anxiety.    PT Comments    Pt continues to refuse to trial EOB. Ordered Kreg tilt bed. RN and rep present. Pt did use bilat LE to help scoot over from hospital bed to kreg tilt bed. Pt continuously requires encouragement to participate and stating his VSS are stable due to high anxiety. Once in bed pt did great and was able to tolerate up to 50 deg of inclination and 30% body weight. Tolerate x6 min total. Janyth Pupa the rep, educated RN and myself on how to use the bed. Encouraged RN to standing him atleast one more time this shift. Pt  Acute PT to cont to follow.    Follow Up Recommendations  SNF (may need LTACH if can't wean of vent)     Equipment Recommendations  Other (comment)    Recommendations for Other Services OT consult     Precautions / Restrictions Precautions Precautions: Sternal;Fall;ICD/Pacemaker Precaution Comments: external pacemaker, cortrak, bil wrist restraints and mitts, chest tube  Restrictions Weight Bearing Restrictions: Yes RUE Weight Bearing: Partial weight bearing RUE Partial Weight Bearing Percentage or Pounds: sternal precautions LUE Weight Bearing: Partial weight bearing LUE Partial Weight Bearing Percentage or Pounds: sternal precautions    Mobility  Bed Mobility               General bed mobility comments: pt refussed to  sit EOB with therapy.  Transfers                 General transfer comment: used Kreg tilt bed and achieve 50 deg incline with 30% WBing (progressing from 12 deg. Pt tolerated a total of 6 min upright in tilt bed  Ambulation/Gait             General Gait Details: unable at this time   Stairs             Wheelchair Mobility    Modified Rankin (Stroke Patients Only)       Balance           Standing balance support:  (in tilt bed) Standing balance-Leahy Scale: Zero Standing balance comment: dependent on tilt bed with support of straps                            Cognition Arousal/Alertness: Awake/alert Behavior During Therapy: Anxious Overall Cognitive Status: Impaired/Different from baseline Area of Impairment: Following commands;Safety/judgement                       Following Commands: Follows one step commands consistently;Follows one step commands with increased time Safety/Judgement: Decreased awareness of safety;Decreased awareness of deficits     General Comments: pt very anxious and becoming frustrated at difficulty with communicating      Exercises      General Comments General comments (skin integrity, edema, etc.): sternal incision, R LE incision with drainage at bottom of it, VSS  Pertinent Vitals/Pain Pain Assessment: Faces Faces Pain Scale: Hurts even more Pain Location: L chest/flank Pain Descriptors / Indicators: Discomfort;Grimacing    Home Living                      Prior Function            PT Goals (current goals can now be found in the care plan section) Progress towards PT goals: Progressing toward goals    Frequency    Min 2X/week      PT Plan Discharge plan needs to be updated;Frequency needs to be updated    Co-evaluation              AM-PAC PT "6 Clicks" Mobility   Outcome Measure  Help needed turning from your back to your side while in a flat bed without  using bedrails?: Total Help needed moving from lying on your back to sitting on the side of a flat bed without using bedrails?: Total Help needed moving to and from a bed to a chair (including a wheelchair)?: Total Help needed standing up from a chair using your arms (e.g., wheelchair or bedside chair)?: Total Help needed to walk in hospital room?: Total Help needed climbing 3-5 steps with a railing? : Total 6 Click Score: 6    End of Session Equipment Utilized During Treatment: Oxygen (vented via trach) Activity Tolerance:  (limited by anxiety) Patient left: in bed;with call bell/phone within reach;with family/visitor present Nurse Communication: Mobility status PT Visit Diagnosis: Other abnormalities of gait and mobility (R26.89);Pain     Time: 0254-2706 PT Time Calculation (min) (ACUTE ONLY): 64 min  Charges:  $Therapeutic Exercise: 23-37 mins $Therapeutic Activity: 23-37 mins                     Kittie Plater, PT, DPT Acute Rehabilitation Services Pager #: [phone] Office #: [phone]    Berline Lopes 02/19/2020, 2:01 PM

## 2020-02-19 NOTE — Progress Notes (Addendum)
Nutrition Follow-up  DOCUMENTATION CODES:   Severe malnutrition in context of chronic illness  INTERVENTION:   Tube Feeding via Cortrak:  Vital AF 1.2 at 55 ml/hr Add Pro-Stat 30 mL daily Provides 114 g of protein, 1684 kcals and 1069 mL of free water  Change liquid MVI to MVI with Minerals  NUTRITION DIAGNOSIS:   Severe Malnutrition related to chronic illness (cerebrovascular disease) as evidenced by severe fat depletion, severe muscle depletion.  Being addressed via TF   GOAL:   Patient will meet greater than or equal to 90% of their needs  Progressing  MONITOR:   PO intake, Supplement acceptance, Weight trends, Labs, I & O's, Skin  REASON FOR ASSESSMENT:   Rounds    ASSESSMENT:   Patient with PMH significant for HTN, severe cerebrovascular disease, CAD, and s/p multilevel C-spine fusion a few months ago. Presents this admission for CABG surgery.  6/01 CABG x 4, Left CEA 6/05 Intubated, Bronch with removal of aspirated pill  6/06 Self-Extubated 6/07 Re-Intubated, Cortrak 6/10 Trach placed  PS trials, hopeful to wean to trach collar  Tolerating Vital AF 1.2 at 55 ml/hr via Cortrak  Current weight 56 kg; net negative 6.8 L. Admit weight 51.7 kg.  Pt with newly documented stage II pressure injury on sacrum  Labs: reviewed Meds: liquid MVI, ss novolog, folic acid, lasix, thiamine   Diet Order:   Diet Order            Diet NPO time specified  Diet effective now                 EDUCATION NEEDS:   Education needs have been addressed  Skin:  Skin Assessment: Skin Integrity Issues: Skin Integrity Issues:: Stage II Stage II: sacrum Incisions: R leg, chest, neck  Last BM:  6/13 rectal tube  Height:   Ht Readings from Last 1 Encounters:  02/06/20 5\' 9"  (1.753 m)    Weight:   Wt Readings from Last 1 Encounters:  02/19/20 56 kg    BMI:  Body mass index is 18.23 kg/m.  Estimated Nutritional Needs:   Kcal:  1542 kcals  Protein:   90-115 g  Fluid:  >/= 1.8 L/day   02/21/20 MS, RDN, LDN, CNSC Registered Dietitian III RD Pager Number and RD On-Call Pager Number Located in [place]

## 2020-02-20 ENCOUNTER — Inpatient Hospital Stay (HOSPITAL_COMMUNITY): Payer: Medicare HMO

## 2020-02-20 DIAGNOSIS — R579 Shock, unspecified: Secondary | ICD-10-CM

## 2020-02-20 DIAGNOSIS — I340 Nonrheumatic mitral (valve) insufficiency: Secondary | ICD-10-CM

## 2020-02-20 DIAGNOSIS — I469 Cardiac arrest, cause unspecified: Secondary | ICD-10-CM

## 2020-02-20 DIAGNOSIS — I361 Nonrheumatic tricuspid (valve) insufficiency: Secondary | ICD-10-CM

## 2020-02-20 LAB — MAGNESIUM: Magnesium: 2.1 mg/dL (ref 1.7–2.4)

## 2020-02-20 LAB — POCT I-STAT 7, (LYTES, BLD GAS, ICA,H+H)
Acid-Base Excess: 3 mmol/L — ABNORMAL HIGH (ref 0.0–2.0)
Acid-base deficit: 3 mmol/L — ABNORMAL HIGH (ref 0.0–2.0)
Bicarbonate: 24.9 mmol/L (ref 20.0–28.0)
Bicarbonate: 27.7 mmol/L (ref 20.0–28.0)
Calcium, Ion: 1.11 mmol/L — ABNORMAL LOW (ref 1.15–1.40)
Calcium, Ion: 1 mmol/L — ABNORMAL LOW (ref 1.15–1.40)
HCT: 23 % — ABNORMAL LOW (ref 39.0–52.0)
HCT: 24 % — ABNORMAL LOW (ref 39.0–52.0)
Hemoglobin: 7.8 g/dL — ABNORMAL LOW (ref 13.0–17.0)
Hemoglobin: 8.2 g/dL — ABNORMAL LOW (ref 13.0–17.0)
O2 Saturation: 91 %
O2 Saturation: 99 %
Patient temperature: 97.8
Patient temperature: 96.9
Potassium: 3.6 mmol/L (ref 3.5–5.1)
Potassium: 3.2 mmol/L — ABNORMAL LOW (ref 3.5–5.1)
Sodium: 141 mmol/L (ref 135–145)
Sodium: 142 mmol/L (ref 135–145)
TCO2: 27 mmol/L (ref 22–32)
TCO2: 29 mmol/L (ref 22–32)
pCO2 arterial: 64 mmHg — ABNORMAL HIGH (ref 32.0–48.0)
pCO2 arterial: 42.7 mmHg (ref 32.0–48.0)
pH, Arterial: 7.195 — CL (ref 7.350–7.450)
pH, Arterial: 7.416 (ref 7.350–7.450)
pO2, Arterial: 73 mmHg — ABNORMAL LOW (ref 83.0–108.0)
pO2, Arterial: 136 mmHg — ABNORMAL HIGH (ref 83.0–108.0)

## 2020-02-20 LAB — POCT I-STAT, CHEM 8
BUN: 38 mg/dL — ABNORMAL HIGH (ref 8–23)
Calcium, Ion: 1.02 mmol/L — ABNORMAL LOW (ref 1.15–1.40)
Chloride: 100 mmol/L (ref 98–111)
Creatinine, Ser: 0.9 mg/dL (ref 0.61–1.24)
Glucose, Bld: 266 mg/dL — ABNORMAL HIGH (ref 70–99)
HCT: 25 % — ABNORMAL LOW (ref 39.0–52.0)
Hemoglobin: 8.5 g/dL — ABNORMAL LOW (ref 13.0–17.0)
Potassium: 3.3 mmol/L — ABNORMAL LOW (ref 3.5–5.1)
Sodium: 143 mmol/L (ref 135–145)
TCO2: 25 mmol/L (ref 22–32)

## 2020-02-20 LAB — COMPREHENSIVE METABOLIC PANEL
ALT: 89 U/L — ABNORMAL HIGH (ref 0–44)
AST: 49 U/L — ABNORMAL HIGH (ref 15–41)
Albumin: 2.2 g/dL — ABNORMAL LOW (ref 3.5–5.0)
Alkaline Phosphatase: 102 U/L (ref 38–126)
Anion gap: 15 (ref 5–15)
BUN: 38 mg/dL — ABNORMAL HIGH (ref 8–23)
CO2: 22 mmol/L (ref 22–32)
Calcium: 8.4 mg/dL — ABNORMAL LOW (ref 8.9–10.3)
Chloride: 103 mmol/L (ref 98–111)
Creatinine, Ser: 1.2 mg/dL (ref 0.61–1.24)
GFR calc Af Amer: >60 mL/min (ref >60)
GFR calc non Af Amer: >60 mL/min (ref >60)
Glucose, Bld: 235 mg/dL — ABNORMAL HIGH (ref 70–99)
Potassium: 4.7 mmol/L (ref 3.5–5.1)
Sodium: 140 mmol/L (ref 135–145)
Total Bilirubin: 0.9 mg/dL (ref 0.3–1.2)
Total Protein: 5.3 g/dL — ABNORMAL LOW (ref 6.5–8.1)

## 2020-02-20 LAB — GLUCOSE, CAPILLARY
Glucose-Capillary: 90 mg/dL (ref 70–99)
Glucose-Capillary: 279 mg/dL — ABNORMAL HIGH (ref 70–99)
Glucose-Capillary: 204 mg/dL — ABNORMAL HIGH (ref 70–99)
Glucose-Capillary: 114 mg/dL — ABNORMAL HIGH (ref 70–99)
Glucose-Capillary: 62 mg/dL — ABNORMAL LOW (ref 70–99)
Glucose-Capillary: 111 mg/dL — ABNORMAL HIGH (ref 70–99)
Glucose-Capillary: 60 mg/dL — ABNORMAL LOW (ref 70–99)
Glucose-Capillary: 77 mg/dL (ref 70–99)

## 2020-02-20 LAB — PREPARE RBC (CROSSMATCH)

## 2020-02-20 LAB — CBC
HCT: 26.7 % — ABNORMAL LOW (ref 39.0–52.0)
Hemoglobin: 8.2 g/dL — ABNORMAL LOW (ref 13.0–17.0)
MCH: 31.3 pg (ref 26.0–34.0)
MCHC: 30.7 g/dL (ref 30.0–36.0)
MCV: 101.9 fL — ABNORMAL HIGH (ref 80.0–100.0)
Platelets: 366 10*3/uL (ref 150–400)
RBC: 2.62 MIL/uL — ABNORMAL LOW (ref 4.22–5.81)
RDW: 14.8 % (ref 11.5–15.5)
WBC: 16.5 10*3/uL — ABNORMAL HIGH (ref 4.0–10.5)
nRBC: 0 % (ref 0.0–0.2)

## 2020-02-20 LAB — ECHOCARDIOGRAM LIMITED
Height: 69 in
Weight: 1975.32 oz

## 2020-02-20 LAB — PHOSPHORUS: Phosphorus: 4.7 mg/dL — ABNORMAL HIGH (ref 2.5–4.6)

## 2020-02-20 LAB — MRSA PCR SCREENING: MRSA by PCR: NEGATIVE

## 2020-02-20 MED ORDER — NOREPINEPHRINE 4 MG/250ML-% IV SOLN
0.0000 ug/min | INTRAVENOUS | Status: DC
  Administered 2020-02-20: 36 ug/min via INTRAVENOUS
  Administered 2020-02-20: 40 ug/min via INTRAVENOUS
  Administered 2020-02-20: 36 ug/min via INTRAVENOUS
  Administered 2020-02-20: 40 ug/min via INTRAVENOUS
  Filled 2020-02-20 (×3): qty 250

## 2020-02-20 MED ORDER — EPINEPHRINE HCL 5 MG/250ML IV SOLN IN NS
INTRAVENOUS | Status: AC
  Filled 2020-02-20: qty 250

## 2020-02-20 MED ORDER — ALBUMIN HUMAN 5 % IV SOLN
INTRAVENOUS | Status: AC
  Administered 2020-02-20: 25 g
  Filled 2020-02-20: qty 500

## 2020-02-20 MED ORDER — POTASSIUM CHLORIDE 10 MEQ/50ML IV SOLN
10.0000 meq | INTRAVENOUS | Status: AC
  Administered 2020-02-20 (×4): 10 meq via INTRAVENOUS
  Filled 2020-02-20 (×4): qty 50

## 2020-02-20 MED ORDER — SODIUM CHLORIDE 0.9 % IV SOLN
2.0000 g | Freq: Three times a day (TID) | INTRAVENOUS | Status: DC
  Administered 2020-02-20 – 2020-02-27 (×23): 2 g via INTRAVENOUS
  Filled 2020-02-20 (×27): qty 2

## 2020-02-20 MED ORDER — DEXTROSE 50 % IV SOLN
INTRAVENOUS | Status: AC
  Filled 2020-02-20: qty 50

## 2020-02-20 MED ORDER — FENTANYL 2500MCG IN NS 250ML (10MCG/ML) PREMIX INFUSION
0.0000 ug/h | INTRAVENOUS | Status: DC
  Administered 2020-02-20: 50 ug/h via INTRAVENOUS
  Administered 2020-02-20: 125 ug/h via INTRAVENOUS
  Administered 2020-02-21 (×2): 250 ug/h via INTRAVENOUS
  Administered 2020-02-21: 300 ug/h via INTRAVENOUS
  Administered 2020-02-22: 275 ug/h via INTRAVENOUS
  Administered 2020-02-22: 330 ug/h via INTRAVENOUS
  Administered 2020-02-22: 325 ug/h via INTRAVENOUS
  Administered 2020-02-23: 350 ug/h via INTRAVENOUS
  Administered 2020-02-23: 400 ug/h via INTRAVENOUS
  Administered 2020-02-23: 330 ug/h via INTRAVENOUS
  Administered 2020-02-24: 30 ug/h via INTRAVENOUS
  Administered 2020-02-24: 200 ug/h via INTRAVENOUS
  Administered 2020-02-25: 350 ug/h via INTRAVENOUS
  Administered 2020-02-26: 50 ug/h via INTRAVENOUS
  Filled 2020-02-20 (×15): qty 250

## 2020-02-20 MED ORDER — NOREPINEPHRINE 16 MG/250ML-% IV SOLN
0.0000 ug/min | INTRAVENOUS | Status: AC
  Administered 2020-02-20: 25 ug/min via INTRAVENOUS
  Administered 2020-02-20: 36 ug/min via INTRAVENOUS
  Filled 2020-02-20: qty 250

## 2020-02-20 MED ORDER — EPINEPHRINE HCL 5 MG/250ML IV SOLN IN NS
0.5000 ug/min | INTRAVENOUS | Status: AC
  Administered 2020-02-20: 37 ug/min via INTRAVENOUS
  Administered 2020-02-20: 29 ug/min via INTRAVENOUS
  Filled 2020-02-20 (×3): qty 250

## 2020-02-20 MED ORDER — VANCOMYCIN HCL 1250 MG/250ML IV SOLN
1250.0000 mg | INTRAVENOUS | Status: DC
  Administered 2020-02-20 – 2020-02-22 (×3): 1250 mg via INTRAVENOUS
  Filled 2020-02-20 (×3): qty 250

## 2020-02-20 MED ORDER — THIAMINE HCL 100 MG/ML IJ SOLN
Freq: Once | INTRAVENOUS | Status: AC
  Filled 2020-02-20: qty 1000

## 2020-02-20 MED ORDER — DEXTROSE 50 % IV SOLN
12.5000 g | INTRAVENOUS | Status: AC
  Administered 2020-02-20: 12.5 g via INTRAVENOUS

## 2020-02-20 MED ORDER — MIDAZOLAM HCL 2 MG/2ML IJ SOLN: 1.0000 mg | INTRAMUSCULAR | Status: DC | PRN

## 2020-02-20 MED ORDER — ALBUMIN HUMAN 5 % IV SOLN: 25.0000 g | Freq: Once | INTRAVENOUS | Status: DC

## 2020-02-20 MED ORDER — FENTANYL CITRATE (PF) 100 MCG/2ML IJ SOLN: 25.0000 ug | INTRAMUSCULAR | Status: DC | PRN

## 2020-02-20 MED ORDER — SODIUM CHLORIDE 0.9% IV SOLUTION: Freq: Once | INTRAVENOUS | Status: AC

## 2020-02-20 MED ORDER — NOREPINEPHRINE 4 MG/250ML-% IV SOLN
INTRAVENOUS | Status: AC
  Filled 2020-02-20: qty 250

## 2020-02-20 MED ORDER — FENTANYL CITRATE (PF) 100 MCG/2ML IJ SOLN
25.0000 ug | INTRAMUSCULAR | Status: DC | PRN
  Administered 2020-02-23 – 2020-03-01 (×7): 25 ug via INTRAVENOUS
  Filled 2020-02-20 (×3): qty 2

## 2020-02-20 MED ORDER — DEXTROSE 50 % IV SOLN
INTRAVENOUS | Status: AC
  Administered 2020-02-20: 25 mL
  Filled 2020-02-20: qty 50

## 2020-02-20 MED ORDER — MIDAZOLAM HCL 2 MG/2ML IJ SOLN
1.0000 mg | INTRAMUSCULAR | Status: DC | PRN
  Administered 2020-02-22: 1 mg via INTRAVENOUS
  Filled 2020-02-20: qty 2

## 2020-02-20 MED ORDER — VASOPRESSIN 20 UNIT/ML IV SOLN
0.0400 [IU]/min | INTRAVENOUS | Status: DC
  Administered 2020-02-20 – 2020-02-22 (×4): 0.04 [IU]/min via INTRAVENOUS
  Filled 2020-02-20 (×5): qty 2

## 2020-02-20 MED ORDER — MIDAZOLAM HCL 2 MG/2ML IJ SOLN
INTRAMUSCULAR | Status: AC
  Administered 2020-02-20: 2 mg
  Filled 2020-02-20: qty 2

## 2020-02-20 MED ORDER — FENTANYL CITRATE (PF) 100 MCG/2ML IJ SOLN
25.0000 ug | INTRAMUSCULAR | Status: DC | PRN
  Administered 2020-02-20: 50 ug via INTRAVENOUS
  Administered 2020-02-21: 100 ug via INTRAVENOUS
  Administered 2020-02-21 – 2020-02-23 (×2): 50 ug via INTRAVENOUS
  Administered 2020-02-23 – 2020-02-24 (×6): 100 ug via INTRAVENOUS
  Administered 2020-02-25: 50 ug via INTRAVENOUS

## 2020-02-20 MED ORDER — MIDAZOLAM 50MG/50ML (1MG/ML) PREMIX INFUSION
0.5000 mg/h | INTRAVENOUS | Status: DC
  Administered 2020-02-20: 1 mg/h via INTRAVENOUS
  Administered 2020-02-24: 0.5 mg/h via INTRAVENOUS
  Filled 2020-02-20 (×3): qty 50

## 2020-02-20 MED FILL — Medication: Qty: 1 | Status: AC

## 2020-02-20 NOTE — Progress Notes (Addendum)
[facility] DAILY ICU PROGRESS NOTE                   [address]            [place] [address]          [phone]   14 Days Post-Op Procedure(s) (LRB): CORONARY ARTERY BYPASS GRAFTING (CABG),  x Four with Bilateral IMAs, and right leg greater saphenous vein (N/A) TRANSESOPHAGEAL ECHOCARDIOGRAM (TEE) (N/A) INDOCYANINE GREEN FLUORESCENCE IMAGING (ICG) (N/A) CAROTID ENDARTERECTOMY LEFT with  Patch Angioplasty Using Hemashield Platinum Finesse patch (Left)  Total Length of Stay:  LOS: 14 days   Subjective: Events of last evening noted, PEA arrest  Objective: Vital signs in last 24 hours: Temp:  [96.9 F (36.1 C)-99.2 F (37.3 C)] 96.9 F (36.1 C) (06/15 0740) Pulse Rate:  [25-137] 101 (06/15 0900) Cardiac Rhythm: Sinus tachycardia (06/15 0730) Resp:  [12-89] 20 (06/15 0900) BP: (65-179)/(46-105) 167/81 (06/15 0900) SpO2:  [46 %-100 %] 97 % (06/15 0900) FiO2 (%):  [40 %-100 %] 100 % (06/15 0800)  Filed Weights   02/17/20 0500 02/18/20 0500 02/19/20 0400  Weight: 60.4 kg 56.2 kg 56 kg    Weight change:    Hemodynamic parameters for last 24 hours:    Intake/Output from previous day: 06/14 0701 - 06/15 0700 In: 1916.6 [I.V.:878; NG/GT:1038.6] Out: 3310 [Urine:2550; Stool:500; Chest Tube:260]  Intake/Output this shift: Total I/O In: -  Out: 310 [Urine:150; Chest Tube:160]  Current Meds: Scheduled Meds:  sodium chloride   Intravenous Once   amiodarone  200 mg Per Tube BID   arformoterol  15 mcg Nebulization BID   aspirin  81 mg Per Tube Daily   atorvastatin  10 mg Per Tube Daily   bisacodyl  10 mg Oral Daily   Or   bisacodyl  10 mg Rectal Daily   budesonide (PULMICORT) nebulizer solution  0.25 mg Nebulization BID   chlorhexidine gluconate (MEDLINE KIT)  15 mL Mouth Rinse BID   Chlorhexidine Gluconate Cloth  6 each Topical Daily   clopidogrel  75 mg Per Tube Daily   docusate  200 mg Per Tube Daily   enoxaparin (LOVENOX) injection  40  mg Subcutaneous Daily   EPINEPHrine NaCl       EPINEPHrine NaCl       feeding supplement (PRO-STAT SUGAR FREE 64)  30 mL Per Tube Daily   folic acid  1 mg Intravenous Daily   insulin aspart  0-24 Units Subcutaneous Q4H   mouth rinse  15 mL Mouth Rinse 10 times per day   multivitamin with minerals  1 tablet Per Tube Daily   pantoprazole (PROTONIX) IV  40 mg Intravenous Q24H   revefenacin  175 mcg Nebulization Daily   sodium chloride flush  10-40 mL Intracatheter Q12H   sodium chloride flush  3 mL Intravenous Q12H   thiamine  100 mg Per Tube Daily   Continuous Infusions:  sodium chloride Stopped (02/07/20 1415)   sodium chloride     sodium chloride 112.5 mL/hr at 02/20/20 0443   albumin human     ceFEPime (MAXIPIME) IV 2 g (02/20/20 0812)   epinephrine 37 mcg/min (02/20/20 0813)   feeding supplement (VITAL AF 1.2 CAL) 55 mL/hr at 02/20/20 0100   fentaNYL infusion INTRAVENOUS 150 mcg/hr (02/20/20 0700)   lactated ringers     lactated ringers 20 mL/hr at 02/08/20 1831   midazolam 1 mg/hr (02/20/20 0700)   norepinephrine     norepinephrine (LEVOPHED)  Adult infusion 40 mcg/min (02/20/20 0817)   phenylephrine (NEO-SYNEPHRINE) Adult infusion Stopped (02/18/20 2230)   potassium chloride     vancomycin 1,250 mg (02/20/20 0820)   vasopressin (PITRESSIN) infusion - *FOR SHOCK* 0.04 Units/min (02/20/20 0700)   PRN Meds:.sodium chloride, albuterol, fentaNYL (SUBLIMAZE) injection, fentaNYL (SUBLIMAZE) injection, fentaNYL (SUBLIMAZE) injection, lactated ringers, metoprolol tartrate, midazolam, midazolam, ondansetron (ZOFRAN) IV, sodium chloride flush, sodium chloride flush  General appearance: sedated but agitated as well Neurologic: uncertain, does appear to move all extrems Heart: irregular Lungs: coarse in upper airways Abdomen: nondistended Extremities: no edema Wound: incis healing well * uncertain if moving LE's Lab Results: CBC: Recent Labs     02/19/20 0308 02/19/20 0308 02/20/20 0420 02/20/20 0420 02/20/20 0918 02/20/20 0926  WBC 8.7  --  16.5*  --   --   --   HGB 7.9*   < > 8.2*   < > 8.5* 8.2*  HCT 25.0*   < > 26.7*   < > 25.0* 24.0*  PLT 276  --  366  --   --   --    < > = values in this interval not displayed.   BMET:  Recent Labs    02/19/20 0308 02/19/20 0308 02/20/20 0420 02/20/20 0420 02/20/20 0918 02/20/20 0926  NA 139   < > 140   < > 143 142  K 3.9   < > 4.7   < > 3.3* 3.2*  CL 104   < > 103  --  100  --   CO2 27  --  22  --   --   --   GLUCOSE 116*   < > 235*  --  266*  --   BUN 30*   < > 38*  --  38*  --   CREATININE 0.84   < > 1.20  --  0.90  --   CALCIUM 7.8*  --  8.4*  --   --   --    < > = values in this interval not displayed.    CMET: Lab Results  Component Value Date   WBC 16.5 (H) 02/20/2020   HGB 8.2 (L) 02/20/2020   HCT 24.0 (L) 02/20/2020   PLT 366 02/20/2020   GLUCOSE 266 (H) 02/20/2020   ALT 89 (H) 02/20/2020   AST 49 (H) 02/20/2020   NA 142 02/20/2020   K 3.2 (L) 02/20/2020   CL 100 02/20/2020   CREATININE 0.90 02/20/2020   BUN 38 (H) 02/20/2020   CO2 22 02/20/2020   INR 1.8 (H) 02/06/2020   HGBA1C 5.2 02/02/2020      PT/INR: No results for input(s): LABPROT, INR in the last 72 hours. Radiology: DG Chest 1 View  Result Date: 02/20/2020 CLINICAL DATA:  Status post code EXAM: CHEST  1 VIEW COMPARISON:  Two days ago FINDINGS: Tracheostomy and enteric tubes in place. Bilateral chest tube. Improved aeration from before, especially on the left. Reticulonodular opacities on both sides still remain. Trace left apical pneumothorax. Stable heart size. IMPRESSION: 1. Improved aeration on the left. 2. Trace left apical pneumothorax. Electronically Signed   By: Monte Fantasia M.D.   On: 02/20/2020 04:48   CT HEAD WO CONTRAST  Result Date: 02/20/2020 CLINICAL DATA:  Ataxia with stroke suspected. Status post cardiac arrest EXAM: CT HEAD WITHOUT CONTRAST TECHNIQUE: Contiguous axial  images were obtained from the base of the skull through the vertex without intravenous contrast. COMPARISON:  02/01/2020 FINDINGS: Brain: When allowing for differences in  scan angle no convincing change in bilateral cerebral patchy low-density. Remote lacunar infarcts at the right and left caudate. Small remote left inferior parietal and cerebellar infarcts. No acute hemorrhage, hydrocephalus, or collection. Gray-white differentiation is preserved when allowing for streak artifact. Vascular: Atherosclerotic calcification.  No hyperdense vessel Skull: Negative Sinuses/Orbits: Negative IMPRESSION: 1. No convincing change from head CT 02/01/2020. 2. Chronic ischemic injury Electronically Signed   By: Monte Fantasia M.D.   On: 02/20/2020 07:21   CT CHEST WO CONTRAST  Result Date: 02/20/2020 CLINICAL DATA:  Cardiac arrest EXAM: CT CHEST WITHOUT CONTRAST TECHNIQUE: Multidetector CT imaging of the chest was performed following the standard protocol without IV contrast. COMPARISON:  Chest x-ray from earlier today FINDINGS: Cardiovascular: Normal heart size. No pericardial effusion. Aortic and coronary atherosclerosis with CABG. Mediastinum/Nodes: Mild soft tissue density in the anterior mediastinum after recent median sternotomy, expected. Negative for adenopathy. Endotracheal and enteric tubes are in good position. Lungs/Pleura: Left upper lobe collapse. Patchy airspace disease in the bilateral lungs with bullous emphysema asymmetric to the right apex. Small left pneumothorax. Bilateral chest tube in place. Upper Abdomen: No acute finding Musculoskeletal: Intact sternal wires after recent CPR. Visualization of rib fractures is more difficult due to motion. There are left third, fourth, and fifth rib fractures at least. IMPRESSION: 1. Left upper lobe collapse and bilateral airspace disease suggestive of pneumonia or aspiration. 2. Left third through fifth rib fractures. 3. Small left pneumothorax. 4. Bullous emphysema  Electronically Signed   By: Monte Fantasia M.D.   On: 02/20/2020 07:25   ECHOCARDIOGRAM LIMITED  Result Date: 02/20/2020    ECHOCARDIOGRAM LIMITED REPORT   Patient Name:   Tony Munoz Date of Exam: 02/20/2020 Medical Rec #:  [id number]       Height:       69.0 in Accession #:    [id number]      Weight:       123.5 lb Date of Birth:  1956-06-15       BSA:          1.683 m Patient Age:    64 years        BP:           113/87 mmHg Patient Gender: M               HR:           107 bpm. Exam Location:  Inpatient Procedure: Limited Echo, Cardiac Doppler and Color Doppler Indications:    tamponade 423.3  History:        Patient has prior history of Echocardiogram examinations, most                 recent 02/12/2020. Cardiac arrest, Prior CABG; COPD.  Sonographer:    Johny Chess Referring Phys: [id number] Z ATKINS  Sonographer Comments: Echo performed with patient supine and on artificial respirator. IMPRESSIONS  1. Left ventricular ejection fraction, by estimation, is 60 to 65%. The left ventricle has normal function. The left ventricle demonstrates regional wall motion abnormalities (see scoring diagram/findings for description). Left ventricular diastolic function could not be evaluated. Wall motion is consistent with infarction in the distribution of the right coronary artery.  2. Right ventricular systolic function is normal. The right ventricular size is normal. There is moderately elevated pulmonary artery systolic pressure.  3. Left atrial size was moderately dilated.  4. The mitral valve is normal in structure. Severe mitral valve regurgitation. No evidence of mitral stenosis.  5. The aortic valve is normal in structure. Aortic valve regurgitation is not visualized. No aortic stenosis is present.  6. The inferior vena cava is normal in size with greater than 50% respiratory variability, suggesting right atrial pressure of 3 mmHg. Comparison(s): Compared to 01/15/2020, overall left ventriculart  systolic function is better, but the pattern of regional wall motion abnormalities is unchanged. Severe mitral insufficiency (likely ischemic mechanism) is also unchanged. FINDINGS  Left Ventricle: Left ventricular ejection fraction, by estimation, is 60 to 65%. The left ventricle has normal function. The left ventricle demonstrates regional wall motion abnormalities. The left ventricular internal cavity size was normal in size. There is no left ventricular hypertrophy. Left ventricular diastolic function could not be evaluated due to atrial fibrillation.  LV Wall Scoring: The basal inferolateral segment and basal inferior segment are akinetic. The mid inferolateral segment, mid inferior segment, and basal inferoseptal segment are hypokinetic. Wall motion is consistent with infarction in the distribution of the right coronary artery. Right Ventricle: The right ventricular size is normal. No increase in right ventricular wall thickness. Right ventricular systolic function is normal. There is moderately elevated pulmonary artery systolic pressure. The tricuspid regurgitant velocity is 3.28 m/s, and with an assumed right atrial pressure of 3 mmHg, the estimated right ventricular systolic pressure is 38.1 mmHg. Left Atrium: Left atrial size was moderately dilated. Right Atrium: Right atrial size was normal in size. Pericardium: There is no evidence of pericardial effusion. Mitral Valve: The mitral valve is normal in structure. Normal mobility of the mitral valve leaflets. Severe mitral valve regurgitation, with posteriorly-directed jet. No evidence of mitral valve stenosis. Tricuspid Valve: The tricuspid valve is normal in structure. Tricuspid valve regurgitation is mild . No evidence of tricuspid stenosis. Aortic Valve: The aortic valve is normal in structure. Aortic valve regurgitation is not visualized. No aortic stenosis is present. Pulmonic Valve: The pulmonic valve was normal in structure. Pulmonic valve  regurgitation is not visualized. No evidence of pulmonic stenosis. Aorta: The aortic root is normal in size and structure. Venous: The inferior vena cava is normal in size with greater than 50% respiratory variability, suggesting right atrial pressure of 3 mmHg. IAS/Shunts: No atrial level shunt detected by color flow Doppler.  LEFT VENTRICLE PLAX 2D LVIDd:         5.20 cm LVIDs:         3.80 cm LV PW:         1.10 cm LV IVS:        1.00 cm LVOT diam:     2.00 cm LVOT Area:     3.14 cm  LEFT ATRIUM         Index LA diam:    4.70 cm 2.79 cm/m   AORTA Ao Root diam: 3.50 cm TRICUSPID VALVE TR Peak grad:   43.0 mmHg TR Vmax:        328.00 cm/s  SHUNTS Systemic Diam: 2.00 cm Mihai Croitoru MD Electronically signed by Sanda Klein MD Signature Date/Time: 02/20/2020/9:43:25 AM    Final      Assessment/Plan: S/P Procedure(s) (LRB): CORONARY ARTERY BYPASS GRAFTING (CABG),  x Four with Bilateral IMAs, and right leg greater saphenous vein (N/A) TRANSESOPHAGEAL ECHOCARDIOGRAM (TEE) (N/A) INDOCYANINE GREEN FLUORESCENCE IMAGING (ICG) (N/A) CAROTID ENDARTERECTOMY LEFT with  Patch Angioplasty Using Hemashield Platinum Finesse patch (Left)   1 critical condition on levo/vaso , full vent support following PEA arrest- PCCM managing . Echo findings c/w RCA infarction- see full report, insure how new these findings  are , may be old 2 vanco and maxipime for pneumonia component, increasing leukocytosis 3 head CT unremarkable 4 Chest CT c/w LLL collapse, Bil ASD c/w pneumonia/aspiration 5 Creat is stable, making urine 6 most recent H/H 8.2/24- transfused this am 7 keep CT's in place til more stable. Small left pntx 8 limited code- no chest compressions John Giovanni PA-C Pager [phone] 02/20/2020 9:51 AM

## 2020-02-20 NOTE — Progress Notes (Signed)
Hypoglycemic Event  CBG: 60  Treatment: D50 25 mL (12.5 gm)  Symptoms: None  Follow-up CBG: Time:2022 CBG Result:111  Possible Reasons for Event: Inadequate meal intake  Comments/MD notified:elink    [name]

## 2020-02-20 NOTE — Progress Notes (Addendum)
Pharmacy Antibiotic Note  Tony Munoz is a 64 y.o. male s/p PEA arrest this morning  Pharmacy has been consulted for vancomycin and cefepime dosing for possible PNA.  -WBC= 16.5, afebile  Plan: Continue cefepime to 2gm IV q8 hours Vancomycin 1250mg  IV q24 hours F/u renal function, cultures and clinical course  Height: 5\' 9"  (175.3 cm) Weight: 56 kg (123 lb 7.3 oz) IBW/kg (Calculated) : 70.7  Temp (24hrs), Avg:97.9 F (36.6 C), Min:96.9 F (36.1 C), Max:99.2 F (37.3 C)  Recent Labs  Lab 02/16/20 0750 02/16/20 1549 02/17/20 0500 02/17/20 1854 02/18/20 0500 02/19/20 0308 02/20/20 0420  WBC 12.2*  --  14.3*  --  8.8 8.7 16.5*  CREATININE 0.82   < > 0.79 0.88 0.80 0.84 1.20   < > = values in this interval not displayed.    Estimated Creatinine Clearance: 48.6 mL/min (by C-G formula based on SCr of 1.2 mg/dL).    No Known Allergies   Thank you for allowing pharmacy to be a part of this patients care.  02/21/20, PharmD Clinical Pharmacist **Pharmacist phone directory can now be found on [email or web address] ([email or web address]).  Listed under [facility].

## 2020-02-20 NOTE — Progress Notes (Signed)
  Echocardiogram 2D Echocardiogram has been performed.  Delcie Roch 02/20/2020, 8:33 AM

## 2020-02-20 NOTE — Plan of Care (Signed)
  Problem: Skin Integrity: Goal: Wound healing without signs and symptoms of infection Outcome: Progressing Goal: Risk for impaired skin integrity will decrease Outcome: Progressing   Problem: Urinary Elimination: Goal: Ability to achieve and maintain adequate renal perfusion and functioning will improve Outcome: Progressing   Problem: Education: Goal: Knowledge of General Education information will improve Description: Including pain rating scale, medication(s)/side effects and non-pharmacologic comfort measures Outcome: Progressing   Problem: Education: Goal: Will demonstrate proper wound care and an understanding of methods to prevent future damage Outcome: Not Progressing Goal: Knowledge of disease or condition will improve Outcome: Not Progressing Goal: Knowledge of the prescribed therapeutic regimen will improve Outcome: Not Progressing Goal: Individualized Educational Video(s) Outcome: Not Progressing   Problem: Activity: Goal: Risk for activity intolerance will decrease Outcome: Not Progressing   Problem: Cardiac: Goal: Will achieve and/or maintain hemodynamic stability Outcome: Not Progressing   Problem: Clinical Measurements: Goal: Postoperative complications will be avoided or minimized Outcome: Not Progressing   Problem: Respiratory: Goal: Respiratory status will improve Outcome: Not Progressing   Problem: Health Behavior/Discharge Planning: Goal: Ability to manage health-related needs will improve Outcome: Not Progressing   Problem: Clinical Measurements: Goal: Ability to maintain clinical measurements within normal limits will improve Outcome: Not Progressing Goal: Will remain free from infection Outcome: Not Progressing Goal: Diagnostic test results will improve Outcome: Not Progressing Goal: Respiratory complications will improve Outcome: Not Progressing Goal: Cardiovascular complication will be avoided Outcome: Not Progressing   Problem:  Activity: Goal: Risk for activity intolerance will decrease Outcome: Not Progressing   Problem: Nutrition: Goal: Adequate nutrition will be maintained Outcome: Not Progressing   Problem: Coping: Goal: Level of anxiety will decrease Outcome: Not Progressing   Problem: Elimination: Goal: Will not experience complications related to bowel motility Outcome: Not Progressing Goal: Will not experience complications related to urinary retention Outcome: Not Progressing   Problem: Pain Managment: Goal: General experience of comfort will improve Outcome: Not Progressing   Problem: Skin Integrity: Goal: Risk for impaired skin integrity will decrease Outcome: Not Progressing

## 2020-02-20 NOTE — Progress Notes (Signed)
Hypoglycemic Event  CBG: <62     Treatment: 12.5g D50  Symptoms: none  Follow-up CBG: [id number] CBG Result: 114  Possible Reasons for Event: NPO and received novalog    Delorse Lek

## 2020-02-20 NOTE — Progress Notes (Signed)
Pharmacy Antibiotic Note  Tony Munoz is a 64 y.o. male admitted on 02/06/2020 with pneumonia.  Pharmacy has been consulted for vancomycin and cefepime dosing.  Plan: Cefepime 2gm IV q8 hours Vancomycin 1250mg  IV q24 hours F/u renal function, cultures and clinical course  Height: 5\' 9"  (175.3 cm) Weight: 56 kg (123 lb 7.3 oz) IBW/kg (Calculated) : 70.7  Temp (24hrs), Avg:98.2 F (36.8 C), Min:97.6 F (36.4 C), Max:99.2 F (37.3 C)  Recent Labs  Lab 02/16/20 0750 02/16/20 0750 02/16/20 1549 02/17/20 0500 02/17/20 1854 02/18/20 0500 02/19/20 0308 02/20/20 0420  WBC 12.2*  --   --  14.3*  --  8.8 8.7 16.5*  CREATININE 0.82   < > 0.78 0.79 0.88 0.80 0.84  --    < > = values in this interval not displayed.    Estimated Creatinine Clearance: 69.4 mL/min (by C-G formula based on SCr of 0.84 mg/dL).    No Known Allergies   Thank you for allowing pharmacy to be a part of this patient's care.  02/21/20 02/20/2020 5:37 AM

## 2020-02-20 NOTE — Progress Notes (Signed)
OT Cancellation Note  Patient Details Name: Tony Munoz MRN: [id number] DOB: Mar 01, 1956   Cancelled Treatment:    Reason Eval/Treat Not Completed: Medical issues which prohibited therapy; noted pt with PEA early this AM, now with FiO2 increased to 100%. Spoke with RN who also confirmed to hold therapies at this time. Will follow.  Marcy Siren, OT Acute Rehabilitation Services Pager [phone] Office [phone]  r  [name] 02/20/2020, 9:05 AM

## 2020-02-20 NOTE — Progress Notes (Signed)
eLink Physician-Brief Progress Note Patient Name: Tony Munoz DOB: 1956-08-13 MRN: [id number]   Date of Service  02/20/2020  HPI/Events of Note  Agitation - patient is currently on a Precedex IV infusion at ceiling dose.   eICU Interventions  Plan: 1. Increase ceiling on Precedex IV infusion to 1.6 mcg/kg/min.      Intervention Category Major Interventions: Delirium, psychosis, severe agitation - evaluation and management  Samantha Ragen Eugene 02/20/2020, 3:28 AM

## 2020-02-20 NOTE — Progress Notes (Addendum)
NAME:  Tony Munoz, MRN:  [id number], DOB:  07/19/1956, LOS: 75 ADMISSION DATE:  02/06/2020, CONSULTATION DATE:  02/08/20 REFERRING MD:  Orvan Seen - CVTS, CHIEF COMPLAINT:  SOB, hypoxia   Brief History   64 yo M with CAD, HTN, COPD admit for elective CABG and L CEA, with progressive respiratory failure with hypoxia and hypercarbia and intubated on POD 3. Self extubated 6/6 and required re-intubation 02/23/23 Coded early morning of 03/03/23, requiring CPR done for two rounds, 85m epinephrine given, 1 amp Bicarb. He was profoundly hypotensive at that point, so a push of epinephrine was given. Patient developed PEA a second time, achieving ROSC after 2 round of CPR, 2 mg Epi.   Past Medical History  EtOH use  CAD  Anxiety Tobacco use disorder HTN   Significant Hospital Events   6/1 CABG and L CEA  6/2 receiving BZD for possible EtOH withdrawals. Was on precedex  6/3 worsening respiratory status with worsening hypoxia. Received 40 mg lasix. PCCM consulted 6/5 intubated and bronch with removal of aspirated pill on bronchus intermedius 6/6 Self extubated  6June 18, 2024Worsening hypoxia with respiratory distress and WOB. Reintubated 6/10 tracheostomy  6/12-6/14 - PS weaning trials  606/26/24Coded   Consults:  Vascular surgery PCCM   Procedures:  6/1 CABG 6/1 L CEA  6/10 tracheostomy   Significant Diagnostic Tests:  Bronch 6/5: mucus plugs bilaterally, blue pill in BI   CXR 606-26-24s/p code with improved aeration compared to prior  Micro Data:  6/5 BAL-Candida tropicalis 6/9 blood culture   Antimicrobials:  Cefuroxime 6/1 (surg ppx)  vanc 6/1 Unasyn 6/4>> 6/12 Cefepime 606/26/24>> Vancomycin 626-Jun-2024>>  Interim history/subjective:   Became increasingly agitated overnight, Precedex was increased. Coded in early am, required CPR x2  Two different times. Currently with decreased mental status and agitated.   Objective   Blood pressure 125/80, pulse (!) 114, temperature (!) 96.9 F (36.1 C), temperature  source Axillary, resp. rate (!) 28, height _0  (1.753 m), weight 56 kg, SpO2 (!) 85 %.    Vent Mode: PRVC FiO2 (%):  [40 %-100 %] 100 % Set Rate:  [18 bmp-20 bmp] 20 bmp Vt Set:  [570 mL] 570 mL PEEP:  [5 cmH20-6 cmH20] 6 cmH20 Pressure Support:  [8 cmH20] 8 cmH20 Plateau Pressure:  [17 cmH20-26 cmH20] 18 cmH20   Intake/Output Summary (Last 24 hours) at 606-26-210815 Last data filed at [id number]-[id number] Gross per 24 hour  Intake 1453.09 ml  Output 3310 ml  Net -1856.91 ml   Filed Weights   02/17/20 0500 02/18/20 0500 02/19/20 0400  Weight: 60.4 kg 56.2 kg 56 kg    Examination:  General: Elderly cachectic male, shaking hands, non purposeful movement, agitated  HENT: PERRLA, trach in place no significant bleeding or secretions PULM: Diffuse wheezing CV: tachycardic, distant heart sounds  GI: Soft, nondistended, no bowel sounds heard MSK: muscle wasting, increased tone in upper extremities Neuro: A&O x0, not following commands, non purposeful movement   Ext: no edema, purple discoloration of L lower extremity, warm   Labs reviewed: Elevated WBC, CMP with slightly increased creatinine, elevated phos, otherwise stable.  Chest x-ray reviewed, improved aeration EKG: pending CT head: without acute changes  Echo: pending  Assessment & Plan:   Acute hypoxic and hypercarbic respiratory failure Aspiration-right lower lobe foreign body found during bronchoscopy, Candida tropicalis on BAL culture. Hx tobacco use HAP Severe bullous emphysema present on chest x-ray imaging Tracheostomy 6/10 Pneumothorax on left, tube  in place S/p needle decompression during code on 6/15 Improved aeration on CXR 6/15 P: -Vanc/Cefepime for empiric antimicrobial coverage  - F/u echo results, if evidence of R heart strain, consider CTA  -Continue routine tracheostomy care -Continue scheduled nebulizers for COPD management -Would like to get him to trach collar trial, will hold off for the day   -Discussed management plans with respiratory therapy. - Chest tube management per cardiothoracic surgery.  Cardic arrest - CPR x2 rounds x2 times - currently on 3 pressors - CXR slightly improved when compared to prior   - CT head without acute change  - DDx includes ACS, hypoxia, sepsis, drug related  P: - ABG - F/u ECHO - Continue pressor support - Continue full mechanical support - Vanc/Cefe for empiric antibiotic coverage - Per conversation with family, no CPR, no escalation of pressors    Acute metabolic encephalopathy > worsening  - Had improved, thought to be caused by hypoxia, hypercarbia, possible medication related in setting of CIWA protocol meds and post-op pain meds - EtOH use disorder with possible EtOH withdrawals  - Acute worsening after code on 6/15, likely d/t to acute metabolic stress, hypoxia, sedating medications - CT head 6/15 negative for any acute change  P: - Continue fentanyl for agitation, pain control - Discontinue Precedex  - Continue thiamine plus folate  CABG 6/1 L CEA 6/1 P: -Aspirin plus Plavix  H/o HTN Afib Thrombocytopenia > improved Cachexia, frailty - continue PT if recovers, not escalating care at the moment  Best practice:  Diet: Tube feeds Pain/Anxiety/Delirium protocol (if indicated): n/a  VAP protocol (if indicated): In place DVT prophylaxis: per primary, post op  GI prophylaxis: protonix  Glucose control: SSI  Mobility: BR Code Status: Full  Family Communication: updated family, discussed GoC  Disposition: ICU   Clarise Cruz Treat, MS4     Pulmonary critical care attending:  This is a 64 year old gentleman history of coronary artery disease, hypertension, COPD.  Patient was taken for coronary artery bypass grafting by Dr. Julien Girt.  Patient was intubated for hypoxemic and hypercarbic respiratory failure postop day 3.  On the morning of 02/20/2020 patient suffered cardiac arrest, PEA with 2 mins of CPR, unfortunately has had  progressive shocklike state requiring max dose vasopressors x3.  BP (!) 156/85 (BP Location: Left Arm)   Pulse (!) 110   Temp 97.8 F (36.6 C) (Oral)   Resp (!) 27   Ht _0  (1.753 m)   Wt 56 kg   SpO2 92%   BMI 18.23 kg/m   General: Elderly cachectic male, critically ill tracheostomy tube in place on mechanical support. HEENT: Attempts to track, tracheostomy tube in place Heart: Regular rhythm S1-S2 Lungs: Bilateral mechanically ventilated breath sounds Abdomen: Nondistended  Labs: Reviewed, increasing white count, increasing serum creatinine Very little urine output  Assessment: Acute hypoxemic hypercarbic respiratory failure Severe bullous emphysema Tracheostomy requiring mechanical vent support Left-sided pneumothorax chest tube in place Cardiac arrest, PEA with ROSC Metabolic encephalopathy secondary to above Recent CABG, recent left carotid endarterectomy Shock, mixed, cardiogenic versus septic Left upper lobe pneumonia possible aspiration, collapse on CT Left-sided rib fractures status post CPR Hypoglycemia  Plan: Patient remains on multiple vasopressors Likely developing multiorgan failure Follow BMP and urine output Discussed care with family as well as Dr. Julien Girt. Please see separate documentation.  Patient's family has requested no more chest compressions.  This was reflected in new CODE STATUS. Continue to wean vasopressors to maintain mean arterial pressure greater than 65 mmHg.  Continue broad-spectrum antibiotics.  This patient is critically ill with multiple organ system failure; which, requires frequent high complexity decision making, assessment, support, evaluation, and titration of therapies. This was completed through the application of advanced monitoring technologies and extensive interpretation of multiple databases. During this encounter critical care time was devoted to patient care services described in this note for 34 minutes.  Garner Nash, DO [facility] 02/20/2020 4:34 PM

## 2020-02-20 NOTE — Progress Notes (Signed)
Patient ID: Tony Munoz, male   DOB: [birth year], 64 y.o.   MRN: [id number] TCTS Evening Rounds:  Hemodynamics fairly stable after PEA arrest overnight. Weaned off epi today. Still on NE 36 mcg but weaning slowly. Vasopressin 0.04  On vent 90% FiO2. Reportedly following commands this afternoon.  Urine output ok.  No chest compressions

## 2020-02-20 NOTE — Progress Notes (Signed)
PCCM:   Spoke with patient's family in the waiting room.  Request to continue full mechanical support at this time.  However no chest compressions.  This was also discussed with Dr. Renaldo Fiddler from cardiothoracic surgery.  Josephine Igo, DO [facility] 02/20/2020 9:28 AM

## 2020-02-20 NOTE — Procedures (Signed)
Arterial Catheter Insertion Procedure Note ADOLPHE FORTUNATO [id number] 1956/04/09  Procedure: Insertion of Arterial Catheter  Indications: Blood pressure monitoring and Frequent blood sampling  Procedure Details Consent: Unable to obtain consent because of emergent medical necessity. Time Out: Verified patient identification, verified procedure, site/side was marked, verified correct patient position, special equipment/implants available, medications/allergies/relevent history reviewed, required imaging and test results available.  Performed  Maximum sterile technique was used including antiseptics, cap, gloves, gown, hand hygiene, mask and sheet. Skin prep: Chlorhexidine;  20 gauge catheter was inserted into right femoral artery using the Seldinger technique. ULTRASOUND GUIDANCE USED: YES Evaluation Blood flow good; BP tracing good. Complications: No apparent complications.     [name], MSN, AGACNP-BC [facility] 02/20/2020, 5:17 AM  See [name] for personal pager PCCM on call pager [phone]

## 2020-02-20 NOTE — Significant Event (Cosign Needed Addendum)
PATIENT NAME: Tony Munoz MEDICAL RECORD NUMBER: [id number] Birthday: 1955-12-21  Age: 64 y.o. Admit Date: 02/06/2020  Provider: Lodema Hong, NP/ Dr. Marcos Eke   Indication: PEA cardiac arrest   Technical Description:   CPR performance duration: 2440-1027; [phone]  Was defibrillation or cardioversion used ? no  Was external pacer placed ? yes  Was patient intubated pre/post CPR ? Trach in place  Was transvenous pacer placed ? No, existing epicardial pacer wires in place  Medications Administered Include      Yes/no Amiodarone   Atropin   Calcium   Epinephrine   4  Lidocaine   Magnesium   Norepinephrine   Phenylephrine   Sodium bicarbonate   2   Vasopression    Evaluation  Final Status - Was patient successfully resuscitated ? yes  If successfully resuscitated - what is current rhythm ? ST If successfully resuscitated - what is current hemodynamic status ? unstable  Miscellaneous Information  Patient with increasing agitation throughout the night, precedex was increased to 1.6 mcg/kg/min by EMD.  Patient then went from ST to paced rhythm and pulseless.  CPR and ACLS measures started.   Easy BVM.  On ROSC patient significantly hypotensive.  Levophed gtt started and Epi gtt added.  No change with Bicarb push x 2.  Femoral Aline placed.  Labs sent.  No significant prior lab abnormalities.  Bedside TTE per Dr. Marcos Eke at bedside.   Patient with diminished left upper lung sounds with visible flail chest and some crepitus.  Right and Left pleural CT remains in place.  CXR with similar left apical PTX.  Normal PIP on MV.  Pt became more hypotensive with brief PEA arrest again [phone] with ROSC after 2 rounds of Epi/ CPR.  Currently, patient is moving all extremities post ROSC, not following commands.  Labs pending.  Remains on high dose NE and Epi gtts.   Primary team notified by bedside RN.    Dr. Marcos Eke updating family, sister, by phone.     [name], MSN,  AGACNP-BC [facility] 02/20/2020, 5:01 AM  See [name] for personal pager PCCM on call pager [phone]

## 2020-02-20 NOTE — Progress Notes (Addendum)
Called to patient bedside for cardiac arrest.    Per his primary nurse, patient had been increasingly agitated and delirious over the course of the evening.  Precedex dose had been increased from 1.2 to 1.6 earlier in the evening.    He had issues with mucous plugging and pressure alarms on the prior night, and tonight per verbal nursing report to me he had one episode of mucous partially obstructing his inner cannula -> this was suctioned away and ventilatory status returned to baseline.  Vent was alarming for high pressure intermittently but it was corresponding with coughing and agitation. Of note, his FIO2 was increased to 70% from 40%  at around 8pm.    Nurse was at bedside when patient became suddenly non responsive and obtunded, HR devolved into a paced rhythm at backup rate of 50bpm, and he was found to be  Pulseless.   CPR done for two rounds, 2mg  epinephrine given.  1 amp Bicarb.   Following rosc, on my arrival, patient was profoundly hypotensive (systolic 99B) and still being paced at 50.  Increased rate to 80 with minimal improvement in BP.  Started levophed and titrated upward as needed (was up to 1mcg/min), though unable to consistently keep MAP >65.  Art line emergently placed and line BP corresponded to cuff.    He remained hypotensive, was temporized with push dose epinephrine (128mcg) per the code sheet.   Poor waveform on pulse ox, intermittent hypoxemia to 50s on sat reading (likely inaccurate).  FIO2 increased to 100% from 80.   Breath sounds were minimal on my exam B, flail chest noted on L, patient was Bag mask ventilated with some improvment.   Breath sounds remained minimal on my exam on the L side.  Lung ultrasound revealed no lung sliding on L, + lung sliding on R.    Needle decompression attempted with an 18g angiocath, no air evacuated however.   ABG  Revealed a respiratory acidosis (7.1/60s/75) and hypoxemia.    Bedside echo revealed global hypokinesis but no  effusion, no tamponade.  RV did not appear large or dysfunctional out of proportion to LV.    BP improved slightly with push epinephrine (270mcg), however patient then again developed PEA.  ROSC after 2 rounds of CPR, 2mg  Epi.  Started on Epi infusion for persistent hypotension.  Patient was ultimately on Levophed 66mcg and epi 42mcg to maintain his blood pressure.    CXR done, revealed B chest tubes (pleural) in place, no tension ptx.  Improved aeration compared to prior but still scattered reticulonodular opacities.   No clear explanation for profound degree of hypoxemia.   Good compliance at this point on vent with peak pressures in low 20s, no sputum on suction, catheter passed into trach easily.   He remains on FIO2 100% with sats in mid 90s.    Following the arrest, he is moving extremities, awake, agitated.   DDX for cardiac arrest: drug related, vs transient hypoxia vs sepsis.   Agree with cultures and antibiotics empirically.  PE possibility entertained but has notably been on ppx anticoag and RV did not appear grossly abnormal on my bedside US.    Spoke with his sister Joseph Art to update her.  She is on her way in with patient's close friend Butch who will accompany her.  She is his closest family member and listed in the chart as his primary contact.   She mentioned to me that he had expressed to [facility] recently that he would  prefer not to be kept alive indefinitely on machines, and she is not sure he would want further CPR if he would not have a good quality of life.  This can be discussed further with his primary team here in the hospital, Cardiothoracic surgery.    [name]

## 2020-02-20 NOTE — Progress Notes (Signed)
CT with RN and back without any complications

## 2020-02-20 NOTE — Progress Notes (Signed)
Pt displayed increasing agitation and delirum over the course of the night. Received order to increase Precedex dose from 1.2  to max 1.6 with no effect at 0330. Pt required frequent verbal contacts and reminders to not pull at trach vent and tubing. At Pt bedside at 0405 when Pt became suddenly unresponsive and obtunded. HR dropped from 110s to paced backup rhythm of 50 and was pulseless. CPR started at the bedside and code blue initiated at 0406. CCM at bedside, attending physician notified. See code sheet for intervention details.

## 2020-02-21 ENCOUNTER — Inpatient Hospital Stay (HOSPITAL_COMMUNITY): Payer: Medicare HMO

## 2020-02-21 LAB — URINE CULTURE: Culture: NO GROWTH

## 2020-02-21 LAB — BASIC METABOLIC PANEL
Anion gap: 8 (ref 5–15)
BUN: 38 mg/dL — ABNORMAL HIGH (ref 8–23)
CO2: 23 mmol/L (ref 22–32)
Calcium: 7.4 mg/dL — ABNORMAL LOW (ref 8.9–10.3)
Chloride: 113 mmol/L — ABNORMAL HIGH (ref 98–111)
Creatinine, Ser: 1 mg/dL (ref 0.61–1.24)
GFR calc Af Amer: >60 mL/min (ref >60)
GFR calc non Af Amer: >60 mL/min (ref >60)
Glucose, Bld: 111 mg/dL — ABNORMAL HIGH (ref 70–99)
Potassium: 3.8 mmol/L (ref 3.5–5.1)
Sodium: 144 mmol/L (ref 135–145)

## 2020-02-21 LAB — POCT I-STAT 7, (LYTES, BLD GAS, ICA,H+H)
Acid-Base Excess: 1 mmol/L (ref 0.0–2.0)
Bicarbonate: 24.9 mmol/L (ref 20.0–28.0)
Calcium, Ion: 1.1 mmol/L — ABNORMAL LOW (ref 1.15–1.40)
HCT: 30 % — ABNORMAL LOW (ref 39.0–52.0)
Hemoglobin: 10.2 g/dL — ABNORMAL LOW (ref 13.0–17.0)
O2 Saturation: 100 %
Patient temperature: 98.6
Potassium: 3.7 mmol/L (ref 3.5–5.1)
Sodium: 146 mmol/L — ABNORMAL HIGH (ref 135–145)
TCO2: 26 mmol/L (ref 22–32)
pCO2 arterial: 36.6 mmHg (ref 32.0–48.0)
pH, Arterial: 7.441 (ref 7.350–7.450)
pO2, Arterial: 262 mmHg — ABNORMAL HIGH (ref 83.0–108.0)

## 2020-02-21 LAB — CBC
HCT: 24.2 % — ABNORMAL LOW (ref 39.0–52.0)
Hemoglobin: 7.7 g/dL — ABNORMAL LOW (ref 13.0–17.0)
MCH: 29.8 pg (ref 26.0–34.0)
MCHC: 31.8 g/dL (ref 30.0–36.0)
MCV: 93.8 fL (ref 80.0–100.0)
Platelets: 273 10*3/uL (ref 150–400)
RBC: 2.58 MIL/uL — ABNORMAL LOW (ref 4.22–5.81)
RDW: 18.9 % — ABNORMAL HIGH (ref 11.5–15.5)
WBC: 7.4 10*3/uL (ref 4.0–10.5)
nRBC: 0 % (ref 0.0–0.2)

## 2020-02-21 LAB — GLUCOSE, CAPILLARY
Glucose-Capillary: 103 mg/dL — ABNORMAL HIGH (ref 70–99)
Glucose-Capillary: 96 mg/dL (ref 70–99)
Glucose-Capillary: 119 mg/dL — ABNORMAL HIGH (ref 70–99)
Glucose-Capillary: 98 mg/dL (ref 70–99)
Glucose-Capillary: 126 mg/dL — ABNORMAL HIGH (ref 70–99)
Glucose-Capillary: 130 mg/dL — ABNORMAL HIGH (ref 70–99)
Glucose-Capillary: 107 mg/dL — ABNORMAL HIGH (ref 70–99)

## 2020-02-21 LAB — MAGNESIUM: Magnesium: 1.7 mg/dL (ref 1.7–2.4)

## 2020-02-21 MED ORDER — TOBRAMYCIN 300 MG/5ML IN NEBU
300.0000 mg | INHALATION_SOLUTION | Freq: Two times a day (BID) | RESPIRATORY_TRACT | Status: DC
  Administered 2020-02-21 – 2020-02-22 (×3): 300 mg via RESPIRATORY_TRACT
  Filled 2020-02-21 (×3): qty 5

## 2020-02-21 MED ORDER — COLLAGENASE 250 UNIT/GM EX OINT
TOPICAL_OINTMENT | Freq: Every day | CUTANEOUS | Status: DC
  Administered 2020-02-26: 1 via TOPICAL
  Filled 2020-02-21: qty 30

## 2020-02-21 MED ORDER — MAGNESIUM SULFATE 2 GM/50ML IV SOLN
2.0000 g | Freq: Once | INTRAVENOUS | Status: AC
  Administered 2020-02-21: 2 g via INTRAVENOUS
  Filled 2020-02-21: qty 50

## 2020-02-21 MED ORDER — NOREPINEPHRINE 16 MG/250ML-% IV SOLN
0.0000 ug/min | INTRAVENOUS | Status: DC
  Administered 2020-02-21: 2 ug/min via INTRAVENOUS
  Administered 2020-02-22: 13 ug/min via INTRAVENOUS
  Administered 2020-02-22: 3 ug/min via INTRAVENOUS
  Administered 2020-02-24 – 2020-02-25 (×2): 2 ug/min via INTRAVENOUS
  Administered 2020-02-27 (×2): 5 ug/min via INTRAVENOUS
  Filled 2020-02-21 (×4): qty 250

## 2020-02-21 NOTE — Progress Notes (Signed)
[address]       Tony Munoz,Tony Munoz [id number]             [phone]      15 Days Post-Op Procedure(s) (LRB): CORONARY ARTERY BYPASS GRAFTING (CABG),  x Four with Bilateral IMAs, and right leg greater saphenous vein (N/A) TRANSESOPHAGEAL ECHOCARDIOGRAM (TEE) (N/A) INDOCYANINE GREEN FLUORESCENCE IMAGING (ICG) (N/A) CAROTID ENDARTERECTOMY LEFT with  Patch Angioplasty Using Hemashield Platinum Finesse patch (Left) Subjective: More alert this am , trying to talk  Objective: Vital signs in last 24 hours: Temp:  [97.8 F (36.6 C)-98.6 F (37 C)] 98.1 F (36.7 C) (06/16 0741) Pulse Rate:  [68-118] 68 (06/16 0715) Cardiac Rhythm: Normal sinus rhythm (06/16 0400) Resp:  [0-30] 24 (06/16 0715) BP: (105-178)/(68-114) 124/80 (06/16 0700) SpO2:  [75 %-100 %] 100 % (06/16 0715) Arterial Line BP: (81-132)/(53-76) 97/56 (06/16 0715) FiO2 (%):  [70 %-100 %] 90 % (06/16 0400) Weight:  [59.6 kg] 59.6 kg (06/16 0406)   Vent Mode: PRVC FiO2 (%):  [70 %-100 %] 90 % Set Rate:  [24 bmp] 24 bmp Vt Set:  [570 mL] 570 mL PEEP:  [14 cmH20] 14 cmH20 Plateau Pressure:  [24 cmH20-26 cmH20] 26 cmH20    Hemodynamic parameters for last 24 hours:    Intake/Output from previous day: 06/15 0701 - 06/16 0700 In: 4900.2 [I.V.:3517.1; Blood:315; NG/GT:150; IV Piggyback:918.1] Out: 1730 [Urine:1025; Stool:275; Chest Tube:430] Intake/Output this shift: No intake/output data recorded.  General appearance: alert, cooperative and no distress Heart: regular rate and rhythm Lungs: coarse, dim in bases Abdomen: soft, nontender Extremities: no edema Wound: imcis healing well  Lab Results: Recent Labs    02/20/20 0420 02/20/20 0434 02/21/20 0302 02/21/20 0401  WBC 16.5*  --   --  7.4  HGB 8.2*   < > 10.2* 7.7*  HCT 26.7*   < > 30.0* 24.2*  PLT 366  --   --  273   < > = values in this interval not displayed.   BMET:  Recent Labs    02/20/20 0420 02/20/20 0434 02/20/20 0918  02/20/20 0926 02/21/20 0302 02/21/20 0401  NA 140   < > 143   < > 146* 144  K 4.7   < > 3.3*   < > 3.7 3.8  CL 103   < > 100  --   --  113*  CO2 22  --   --   --   --  23  GLUCOSE 235*   < > 266*  --   --  111*  BUN 38*   < > 38*  --   --  38*  CREATININE 1.20   < > 0.90  --   --  1.00  CALCIUM 8.4*  --   --   --   --  7.4*   < > = values in this interval not displayed.    PT/INR: No results for input(s): LABPROT, INR in the last 72 hours. ABG    Component Value Date/Time   PHART 7.441 02/21/2020 0302   HCO3 24.9 02/21/2020 0302   TCO2 26 02/21/2020 0302   ACIDBASEDEF 3.0 (H) 02/20/2020 0434   O2SAT 100.0 02/21/2020 0302   CBG (last 3)  Recent Labs    02/21/20 0140 02/21/20 0404 02/21/20 0739  GLUCAP 103* 96 98    Meds Scheduled Meds: . sodium chloride   Intravenous Once  . amiodarone  200 mg Per Tube BID  . arformoterol  15 mcg Nebulization BID  . aspirin  81 mg Per Tube Daily  . atorvastatin  10 mg Per Tube Daily  . bisacodyl  10 mg Oral Daily   Or  . bisacodyl  10 mg Rectal Daily  . budesonide (PULMICORT) nebulizer solution  0.25 mg Nebulization BID  . chlorhexidine gluconate (MEDLINE KIT)  15 mL Mouth Rinse BID  . Chlorhexidine Gluconate Cloth  6 each Topical Daily  . clopidogrel  75 mg Per Tube Daily  . docusate  200 mg Per Tube Daily  . enoxaparin (LOVENOX) injection  40 mg Subcutaneous Daily  . feeding supplement (PRO-STAT SUGAR FREE 64)  30 mL Per Tube Daily  . folic acid  1 mg Intravenous Daily  . insulin aspart  0-24 Units Subcutaneous Q4H  . mouth rinse  15 mL Mouth Rinse 10 times per day  . multivitamin with minerals  1 tablet Per Tube Daily  . pantoprazole (PROTONIX) IV  40 mg Intravenous Q24H  . revefenacin  175 mcg Nebulization Daily  . sodium chloride flush  10-40 mL Intracatheter Q12H  . sodium chloride flush  3 mL Intravenous Q12H  . thiamine  100 mg Per Tube Daily  . tobramycin (PF)  300 mg Nebulization BID   Continuous Infusions: .  sodium chloride Stopped (02/07/20 1415)  . sodium chloride    . sodium chloride 112.5 mL/hr at 02/20/20 0443  . albumin human    . ceFEPime (MAXIPIME) IV Stopped (02/21/20 0531)  . feeding supplement (VITAL AF 1.2 CAL) 30 mL/hr at 02/20/20 2353  . fentaNYL infusion INTRAVENOUS 250 mcg/hr (02/21/20 0702)  . lactated ringers    . lactated ringers 20 mL/hr at 02/08/20 1831  . midazolam Stopped (02/20/20 1819)  . phenylephrine (NEO-SYNEPHRINE) Adult infusion Stopped (02/18/20 2230)  . vancomycin 166.7 mL/hr at 02/21/20 0600  . vasopressin (PITRESSIN) infusion - *FOR SHOCK* 0.04 Units/min (02/21/20 0600)   PRN Meds:.sodium chloride, albuterol, fentaNYL (SUBLIMAZE) injection, fentaNYL (SUBLIMAZE) injection, fentaNYL (SUBLIMAZE) injection, lactated ringers, metoprolol tartrate, midazolam, midazolam, ondansetron (ZOFRAN) IV, sodium chloride flush, sodium chloride flush  Xrays DG Chest 1 View  Result Date: 02/20/2020 CLINICAL DATA:  Status post code EXAM: CHEST  1 VIEW COMPARISON:  Two days ago FINDINGS: Tracheostomy and enteric tubes in place. Bilateral chest tube. Improved aeration from before, especially on the left. Reticulonodular opacities on both sides still remain. Trace left apical pneumothorax. Stable heart size. IMPRESSION: 1. Improved aeration on the left. 2. Trace left apical pneumothorax. Electronically Signed   By: Monte Fantasia M.D.   On: 02/20/2020 04:48   CT HEAD WO CONTRAST  Result Date: 02/20/2020 CLINICAL DATA:  Ataxia with stroke suspected. Status post cardiac arrest EXAM: CT HEAD WITHOUT CONTRAST TECHNIQUE: Contiguous axial images were obtained from the base of the skull through the vertex without intravenous contrast. COMPARISON:  02/01/2020 FINDINGS: Brain: When allowing for differences in scan angle no convincing change in bilateral cerebral patchy low-density. Remote lacunar infarcts at the right and left caudate. Small remote left inferior parietal and cerebellar infarcts.  No acute hemorrhage, hydrocephalus, or collection. Gray-white differentiation is preserved when allowing for streak artifact. Vascular: Atherosclerotic calcification.  No hyperdense vessel Skull: Negative Sinuses/Orbits: Negative IMPRESSION: 1. No convincing change from head CT 02/01/2020. 2. Chronic ischemic injury Electronically Signed   By: Monte Fantasia M.D.   On: 02/20/2020 07:21   CT CHEST WO CONTRAST  Result Date: 02/20/2020 CLINICAL DATA:  Cardiac arrest EXAM: CT CHEST WITHOUT CONTRAST TECHNIQUE: Multidetector CT imaging of  the chest was performed following the standard protocol without IV contrast. COMPARISON:  Chest x-ray from earlier today FINDINGS: Cardiovascular: Normal heart size. No pericardial effusion. Aortic and coronary atherosclerosis with CABG. Mediastinum/Nodes: Mild soft tissue density in the anterior mediastinum after recent median sternotomy, expected. Negative for adenopathy. Endotracheal and enteric tubes are in good position. Lungs/Pleura: Left upper lobe collapse. Patchy airspace disease in the bilateral lungs with bullous emphysema asymmetric to the right apex. Small left pneumothorax. Bilateral chest tube in place. Upper Abdomen: No acute finding Musculoskeletal: Intact sternal wires after recent CPR. Visualization of rib fractures is more difficult due to motion. There are left third, fourth, and fifth rib fractures at least. IMPRESSION: 1. Left upper lobe collapse and bilateral airspace disease suggestive of pneumonia or aspiration. 2. Left third through fifth rib fractures. 3. Small left pneumothorax. 4. Bullous emphysema Electronically Signed   By: Monte Fantasia M.D.   On: 02/20/2020 07:25   ECHOCARDIOGRAM LIMITED  Result Date: 02/20/2020    ECHOCARDIOGRAM LIMITED REPORT   Patient Name:   Tony Munoz Date of Exam: 02/20/2020 Medical Rec #:  [id number]       Height:       69.0 in Accession #:    [id number]      Weight:       123.5 lb Date of Birth:  1956/02/01        BSA:          1.683 m Patient Age:    64 years        BP:           113/87 mmHg Patient Gender: M               HR:           107 bpm. Exam Location:  Inpatient Procedure: Limited Echo, Cardiac Doppler and Color Doppler Indications:    tamponade 423.3  History:        Patient has prior history of Echocardiogram examinations, most                 recent 02/12/2020. Cardiac arrest, Prior CABG; COPD.  Sonographer:    Johny Chess Referring Phys: [id number] VOHYWVP Z ATKINS  Sonographer Comments: Echo performed with patient supine and on artificial respirator. IMPRESSIONS  1. Left ventricular ejection fraction, by estimation, is 60 to 65%. The left ventricle has normal function. The left ventricle demonstrates regional wall motion abnormalities (see scoring diagram/findings for description). Left ventricular diastolic function could not be evaluated. Wall motion is consistent with infarction in the distribution of the right coronary artery.  2. Right ventricular systolic function is normal. The right ventricular size is normal. There is moderately elevated pulmonary artery systolic pressure.  3. Left atrial size was moderately dilated.  4. The mitral valve is normal in structure. Severe mitral valve regurgitation. No evidence of mitral stenosis.  5. The aortic valve is normal in structure. Aortic valve regurgitation is not visualized. No aortic stenosis is present.  6. The inferior vena cava is normal in size with greater than 50% respiratory variability, suggesting right atrial pressure of 3 mmHg. Comparison(s): Compared to 01/15/2020, overall left ventriculart systolic function is better, but the pattern of regional wall motion abnormalities is unchanged. Severe mitral insufficiency (likely ischemic mechanism) is also unchanged. FINDINGS  Left Ventricle: Left ventricular ejection fraction, by estimation, is 60 to 65%. The left ventricle has normal function. The left ventricle demonstrates regional wall motion  abnormalities. The left ventricular internal  cavity size was normal in size. There is no left ventricular hypertrophy. Left ventricular diastolic function could not be evaluated due to atrial fibrillation.  LV Wall Scoring: The basal inferolateral segment and basal inferior segment are akinetic. The mid inferolateral segment, mid inferior segment, and basal inferoseptal segment are hypokinetic. Wall motion is consistent with infarction in the distribution of the right coronary artery. Right Ventricle: The right ventricular size is normal. No increase in right ventricular wall thickness. Right ventricular systolic function is normal. There is moderately elevated pulmonary artery systolic pressure. The tricuspid regurgitant velocity is 3.28 m/s, and with an assumed right atrial pressure of 3 mmHg, the estimated right ventricular systolic pressure is 95.7 mmHg. Left Atrium: Left atrial size was moderately dilated. Right Atrium: Right atrial size was normal in size. Pericardium: There is no evidence of pericardial effusion. Mitral Valve: The mitral valve is normal in structure. Normal mobility of the mitral valve leaflets. Severe mitral valve regurgitation, with posteriorly-directed jet. No evidence of mitral valve stenosis. Tricuspid Valve: The tricuspid valve is normal in structure. Tricuspid valve regurgitation is mild . No evidence of tricuspid stenosis. Aortic Valve: The aortic valve is normal in structure. Aortic valve regurgitation is not visualized. No aortic stenosis is present. Pulmonic Valve: The pulmonic valve was normal in structure. Pulmonic valve regurgitation is not visualized. No evidence of pulmonic stenosis. Aorta: The aortic root is normal in size and structure. Venous: The inferior vena cava is normal in size with greater than 50% respiratory variability, suggesting right atrial pressure of 3 mmHg. IAS/Shunts: No atrial level shunt detected by color flow Doppler.  LEFT VENTRICLE PLAX 2D LVIDd:          5.20 cm LVIDs:         3.80 cm LV PW:         1.10 cm LV IVS:        1.00 cm LVOT diam:     2.00 cm LVOT Area:     3.14 cm  LEFT ATRIUM         Index LA diam:    4.70 cm 2.79 cm/m   AORTA Ao Root diam: 3.50 cm TRICUSPID VALVE TR Peak grad:   43.0 mmHg TR Vmax:        328.00 cm/s  SHUNTS Systemic Diam: 2.00 cm Mihai Croitoru MD Electronically signed by Sanda Klein MD Signature Date/Time: 02/20/2020/9:43:25 AM    Final     Assessment/Plan: S/P Procedure(s) (LRB): CORONARY ARTERY BYPASS GRAFTING (CABG),  x Four with Bilateral IMAs, and right leg greater saphenous vein (N/A) TRANSESOPHAGEAL ECHOCARDIOGRAM (TEE) (N/A) INDOCYANINE GREEN FLUORESCENCE IMAGING (ICG) (N/A) CAROTID ENDARTERECTOMY LEFT with  Patch Angioplasty Using Hemashield Platinum Finesse patch (Left)  1 more stable this am , hopefully can cont to wean off gtts. Vent management as per PCCM- hopefully can cont to progress to TC trials but limited reserves and PEA arrest have slowed progress . Cont nebs, cont current empiric abx. Keep CT's in place  2 renal fxn remains normal 3 H/H has dropped further, may need further transfusion if conts to drop 4 replacing MG++   LOS: 15 days    John Giovanni PA-C Pager [phone] 02/21/2020

## 2020-02-21 NOTE — Progress Notes (Signed)
Patient ID: Tony Munoz, male   DOB: February 07, 1956, 64 y.o.   MRN: [id number] EVENING ROUNDS NOTE :     [address]       [place] [address]             [phone]                 15 Days Post-Op Procedure(s) (LRB): CORONARY ARTERY BYPASS GRAFTING (CABG),  x Four with Bilateral IMAs, and right leg greater saphenous vein (N/A) TRANSESOPHAGEAL ECHOCARDIOGRAM (TEE) (N/A) INDOCYANINE GREEN FLUORESCENCE IMAGING (ICG) (N/A) CAROTID ENDARTERECTOMY LEFT with  Patch Angioplasty Using Hemashield Platinum Finesse patch (Left)  Total Length of Stay:  LOS: 15 days  BP (!) 106/55   Pulse 94   Temp 97.6 F (36.4 C) (Oral)   Resp 20   Ht 5\' 9"  (1.753 m)   Wt 59.6 kg   SpO2 93%   BMI 19.40 kg/m   .Intake/Output      06/15 0701 - 06/16 0700 06/16 0701 - 06/17 0700   I.V. (mL/kg) 3517.1 (59) 462.3 (7.8)   Blood 315    Other     NG/GT 150    IV Piggyback 918.1 329.7   Total Intake(mL/kg) 4900.2 (82.2) 792 (13.3)   Urine (mL/kg/hr) 1025 (0.7) 400 (0.6)   Emesis/NG output 0    Stool 275    Chest Tube 430 100   Total Output 1730 500   Net +3170.2 +292          . sodium chloride Stopped (02/07/20 1415)  . sodium chloride    . sodium chloride 112.5 mL/hr at 02/20/20 0443  . albumin human    . ceFEPime (MAXIPIME) IV Stopped (02/21/20 1429)  . feeding supplement (VITAL AF 1.2 CAL) 30 mL/hr at 02/20/20 2353  . fentaNYL infusion INTRAVENOUS 250 mcg/hr (02/21/20 1631)  . lactated ringers    . lactated ringers 20 mL/hr at 02/08/20 1831  . midazolam Stopped (02/20/20 1819)  . norepinephrine (LEVOPHED) Adult infusion Stopped (02/21/20 1442)  . phenylephrine (NEO-SYNEPHRINE) Adult infusion Stopped (02/18/20 2230)  . vancomycin Stopped (02/21/20 0704)  . vasopressin (PITRESSIN) infusion - *FOR SHOCK* 0.04 Units/min (02/21/20 1631)     Lab Results  Component Value Date   WBC 7.4 02/21/2020   HGB 7.7 (L) 02/21/2020   HCT 24.2 (L) 02/21/2020   PLT 273 02/21/2020    GLUCOSE 111 (H) 02/21/2020   ALT 89 (H) 02/20/2020   AST 49 (H) 02/20/2020   NA 144 02/21/2020   K 3.8 02/21/2020   CL 113 (H) 02/21/2020   CREATININE 1.00 02/21/2020   BUN 38 (H) 02/21/2020   CO2 23 02/21/2020   INR 1.8 (H) 02/06/2020   HGBA1C 5.2 02/02/2020   Limited code On vent with lots of secreations    02/04/2020 MD  Beeper [phone] Office [phone] 02/21/2020 5:43 PM

## 2020-02-21 NOTE — Progress Notes (Signed)
Patient's family has had discussion with him and he has independently decided he would like to be comfort care only. PA Gold paged for orders.

## 2020-02-21 NOTE — Progress Notes (Addendum)
NAME:  Tony Munoz, MRN:  [id number], DOB:  May 21, 1956, LOS: 8 ADMISSION DATE:  02/06/2020, CONSULTATION DATE:  02/08/20 REFERRING MD:  Orvan Seen - CVTS, CHIEF COMPLAINT:  SOB, hypoxia   Brief History   64 yo M with CAD, HTN, COPD admit for elective CABG and L CEA, with progressive respiratory failure with hypoxia and hypercarbia and intubated on POD 3. Self extubated 6/6 and required re-intubation 02/14/2023 Coded early morning of 02-22-2023, requiring CPR done for two rounds, 15m epinephrine given, 1 amp Bicarb. He was profoundly hypotensive at that point, so a push of epinephrine was given. Patient developed PEA a second time, achieving ROSC after 2 round of CPR, 2 mg Epi.   Past Medical History  EtOH use  CAD  Anxiety Tobacco use disorder HTN   Significant Hospital Events   6/1 CABG and L CEA  6/2 receiving BZD for possible EtOH withdrawals. Was on precedex  6/3 worsening respiratory status with worsening hypoxia. Received 40 mg lasix. PCCM consulted 6/5 intubated and bronch with removal of aspirated pill on bronchus intermedius 6/6 Self extubated  606/09/2024Worsening hypoxia with respiratory distress and WOB. Reintubated 6/10 tracheostomy  6/12-6/14 - PS weaning trials  606-17-24PEA arrest  Consults:  Vascular surgery PCCM   Procedures:  6/1 CABG 6/1 L CEA  6/10 tracheostomy   Significant Diagnostic Tests:  Bronch 6/5: mucus plugs bilaterally, blue pill in BI   CXR 606/17/24s/p code with improved aeration compared to prior CT Chest 62024/06/17with partial left lung collapse, airspace disease c/w aspiration vs pna   Micro Data:  6/5 BAL-Candida tropicalis 6/9 blood culture - no growth 62024/06/17blood culture- 617-Jun-2024trach aspirate- rare GNRs 606-17-24urine culture-   Antimicrobials:  Cefuroxime 6/1 (surg ppx)  vanc 6/1 Unasyn 6/4>> 6/12 Cefepime 606-17-24>> Vancomycin 606-17-24>>  Interim history/subjective:   Patient noting pain, requesting more pain medications and a vitamin. Overall, has been alert,  following commands, using a pad to communicate.   Objective   Blood pressure 124/80, pulse 68, temperature 98.6 F (37 C), temperature source Oral, resp. rate (!) 24, height _0  (1.753 m), weight 59.6 kg, SpO2 100 %.    Vent Mode: PRVC FiO2 (%):  [70 %-100 %] 90 % Set Rate:  [20 bmp-24 bmp] 24 bmp Vt Set:  [570 mL] 570 mL PEEP:  [5 cmH20-14 cmH20] 14 cmH20 Plateau Pressure:  [24 cmH20-26 cmH20] 26 cmH20   Intake/Output Summary (Last 24 hours) at 02/21/2020 0719 Last data filed at 02/21/2020 0600 Gross per 24 hour  Intake 4900.16 ml  Output 1730 ml  Net 3170.16 ml   Filed Weights   02/19/20 0400 02/21/20 0000 02/21/20 0406  Weight: 56 kg 59.6 kg 59.6 kg    Examination:  General: Elderly cachectic male, shaking hands, writing on pad HENT: PERRLA, trach in place no significant bleeding or secretions PULM: coarse lung sounds b/l  CV: tachycardic, distant heart sounds  GI: Soft, nondistended, no bowel sounds heard MSK: muscle wasting, normal ROM in upper extremities Neuro: A&O x3, following commands, writing on pad to communicate Ext: no edema, purple discoloration of L lower extremity, warm   Labs reviewed: decrease in hgb, wbc and plts, CMP with slightly increased creatinine, otherwise stable.  CT Chest: partially collapsed L upper lung, asp vs pna , rib fractures Echo: normal LV function   Assessment & Plan:   Acute hypoxic and hypercarbic respiratory failure Aspiration-right lower lobe foreign body found during bronchoscopy, Candida tropicalis on  BAL culture. Hx tobacco use HAP Severe bullous emphysema present on chest x-ray imaging Tracheostomy 6/10 Pneumothorax on left, tube in place S/p needle decompression during code on 6/15 CT Chest on 6/15 with LUL collapse b/l airspace dz, rib fractures  P: -Vanc/Cefepime for empiric antimicrobial coverage  - Wean vent as able  -Continue routine tracheostomy care -Continue scheduled nebulizers for COPD management -Would  like to get him to trach collar trial, will hold off for the day  -Discussed management plans with respiratory therapy. - Chest tube management per cardiothoracic surgery.  Cardic arrest- PEA - CPR x2 rounds x2 times - currently on 2 pressors - CXR slightly improved when compared to prior  - CT Chest with LUL collapse, b/l airspace disease, rib fractures   - CT head without acute change  - Echo with preserved EF  - DDx includes ACS, hypoxia, sepsis, drug related  P: - Continue to wean pressor support - Continue full mechanical support, weaning as able  - Vanc/Cefe for empiric antibiotic coverage - F/u blood, urine and trach cultures  - Per conversation with family, no CPR, no escalation of pressors - Given patient is alert today, encourage patient and family to have discussion about goals of care, desired level of treatment     Acute metabolic encephalopathy > improving - Had improved, thought to be caused by hypoxia, hypercarbia, possible medication related in setting of CIWA protocol meds and post-op pain meds - EtOH use disorder with possible EtOH withdrawals  - Acute worsening after code on 6/15, likely d/t to acute metabolic stress, hypoxia, sedating medications - CT head 6/15 negative for any acute change  P: - Continue fentanyl for agitation, pain control - Discontinue Precedex  - Continue thiamine plus folate - Plan to help patient get to the chair today or tomorrow   CABG 6/1 L CEA 6/1 P: -Aspirin plus Plavix  H/o HTN Afib Thrombocytopenia > improved Cachexia, frailty - continue PT if recovers, not escalating care at the moment  Best practice:  Diet: Tube feeds Pain/Anxiety/Delirium protocol (if indicated): n/a  VAP protocol (if indicated): In place DVT prophylaxis: per primary, post op  GI prophylaxis: protonix  Glucose control: SSI  Mobility: BR Code Status: partial  Family Communication: will update family Disposition: ICU   Sara Treat, MS4      PCCM:  64 yo s/p CABG, ARF on MV, s/p bried PEA arrest. In shock. Pressors weaning. Now awake and alert. Communication with writing.   BP (!) 132/117 (BP Location: Left Arm)   Pulse 78   Temp 98.1 F (36.7 C) (Oral)   Resp (!) 26   Ht _0  (1.753 m)   Wt 59.6 kg   SpO2 (!) 80%   BMI 19.40 kg/m   Gen: elderly frail male, resting in bed on MV  HENT: trach in place, no bleeding  Heart: RRR, s1 s2  Lungs: BL vented breaths  Abd: soft, nt nd   Labs reviewed  CXR reviewed: patchy nifiltrates, small left ptx   A: PEA  Arrest  AHRF on MV Trach in place  Shock, post arrest SIRs, weaning pressors  CAD s/p CABG  Emphysema   P: Weaning pressors MAP goal 65 mmHg Remains on vent  Ribs fractures will likely limit his weaning ability as well as poor lung function  Continue scheduled nebs   This patient is critically ill with multiple organ system failure; which, requires frequent high complexity decision making, assessment, support, evaluation, and titration of therapies.  This was completed through the application of advanced monitoring technologies and extensive interpretation of multiple databases. During this encounter critical care time was devoted to patient care services described in this note for 32 minutes.  Garner Nash, DO [facility] 02/21/2020 10:33 AM

## 2020-02-21 NOTE — Consult Note (Signed)
WOC Nurse Consult Note: Patient receiving care in [facility] 2h05.  Assisted with turning by his primary RN. Reason for Consult: unstageable PU Wound type: Unstageable PI to coccyx Pressure Injury POA: No Measurement: 2 cm x 2 cm x unknown depth Wound bed: yellow/brown Drainage (amount, consistency, odor) none Periwound: intact Dressing procedure/placement/frequency: Apply Santyl to coccyx in a nickel thick layer. Cover with a saline moistened gauze, then dry gauze or ABD pad.  Change daily. Monitor the wound area(s) for worsening of condition such as: Signs/symptoms of infection,  Increase in size,  Development of or worsening of odor, Development of pain, or increased pain at the affected locations.  Notify the medical team if any of these develop.  Helmut Muster, RN, MSN, CWOCN, CNS-BC, pager [phone]

## 2020-02-21 NOTE — Progress Notes (Addendum)
Dr. Vickey Sages made aware of patient's desire for comfort care and does not believe this is a viable option at this time and suggested an ethics consult if patient wished to pursue this option. Dr. Theo Dills) from the ethics committee called this RN and indicated that she would visit the patient this evening and discuss a psychiatry consult for the patient with Dr. Vickey Sages. RN relayed information to patient and patient's family. Family friend Butch told RN that Luster Landsberg is not the patient's sister, rather sister Consuella Lose in [place] and nephew [name] are closest family. Sister is unable to travel but [name] is arriving from out of state the afternoon of 6/17 and is executor of patient's estate. Chaplain paged to discuss healthcare POA and patient indicated his nephew [name] will be named and paperwork will be done upon his arrival.

## 2020-02-21 NOTE — Progress Notes (Addendum)
Ethics Consult Note  Called by Dr. Orvan Seen today around 3:30 pm - attending physician  Also called by Julaine Fusi, RN   Question of patient possibly wanting to come off the ventilator - what is the correct course of action?   Background: Patient admitted 02/06/2020 for elective CABG, currently POD #88 Recovery complicated by aspiration requiring reintubation  Subsequent difficulty weaning off vent PEA arrest and was resuscitated Has trach in place now Is "no compressions" code status now  Is awake, interactive, can write but is not able to speak Next of kin: sister, nephew Other support: close friends Friends state he would "never want this" to be "connected to machines"  Concerns:  Patient expressed desire to come off life support / mechanical ventilation This seems to be a fairly abrupt change of heart for him  He is not medically terminal Moral distress for physician and medical team re: should we or should we not discontinue mechanical ventilation in someone with a relatively good prognosis for recovery     Ethical Principles:  Autonomy: patients have the right to decide what is done or not done to them and/or for them, even if others may think that those decisions are unreasonable. Therefore, patient DOES have the right to decide to continue or discontinue mechanical ventilation. HOWEVER, a precondition of this would be ensuring patient has the CAPACITY to give INFORMED CONSENT. Does he have a complete understanding of risks versus benefits and consequences of his decisions to allow or to refuse medical interventions in this case? Does he have a clear understanding of his prognosis if he chooses to continue recommended medical treatment? Capacity is apparently in question - communication is difficult since he is not able to speak, he has had periods of delirium, there is some concern that the views of his family and his friend are perhaps influencing him, there is some concern  for his mental health causing him to catastrophize the situation or possibly feel he has no reason to live.   Beneficence/Nonmaleficence: medical team sees chance for meaningful recovery and feel that discontinuing mechanical ventilation would be a direct HARM. Furthermore, by consenting to invasive surgery with high risk of complications, patient should have been prepared for a prolonged recovery. It does not sit well with the medical team in general that patient would want to "throw in the towel" at this point after so much has been done already.      Recommendations:  1. Physicians are capable of determining capacity. If CAPACITY is at all in question, would consider second opinion from another physician. Would seek evidence that patient has been / continues to be CONSISTENT in his wishes [place] when he is LUCID. Could certainly consider psychiatric evaluation to determine whether and to what extent mental illness might be involved in the patient's thought process.    2. If patient DOES have capacity and a clear understanding of risks/benefits of prognosis and continuing vs discontinuing mechanical ventilation, and he still wants to discontinue this treatment, then we MUST abide by his wishes even if that action results in his death. If he sees continued treatment as doing him more harm than good, based on his OWN VALUES, then we are obligated to discontinue that harmful treatment  3. If patients DOES NOT have capacity, then legal surrogate, as far as is known, is his sister or nephew. His friends of course can provide valuable insight but we are not obligated to follow their requests.  ADDENDUM 02/21/20 8:36 PM   I spoke with patient and family at bedside.  Present at bedside are patient's stepsister/half-sister Luster Landsberg, and her husband Reita Cliche.    I advised them that I was called as Engineer, materials given the concern that he had expressed a wish to be removed from mechanical  ventilation, and this would almost certainly result in his death.  I asked everyone to go through their side of the story, tell me where we were at now.  From the information I gathered from [place], it sounds like they were feeling some pressure from cardiothoracic surgery and critical care to make decisions about "what needs to be done." They understood this to mean whether or not to take Tony Munoz off of life support right now.  I think what was actually being discussed was CODE STATUS, particularly given his recent arrest, concern over whether or not he would survive another arrest/resuscitation effort.  I reassured them that nobody is asking them to make a decision right now regarding taking him off of ventilatory support.  Family and patient seemed relieved at this information.  I asked for some clarification regarding the patient's wish to be made comfort care.  I explained that what the medical team means by comfort care is that we discontinue therapeutic intervention and focus on pain control and natural death.  Patient wrote out "I want to live."  I asked him yes or no, does he want me to remove the ventilator even if this results in death, and he said no.   In speaking a bit more with the family, it sounds like there was a friend present this morning, Butch, who became upset at patient's condition, patient was subsequently feeling very down about this and did express a wish for comfort care / remove life support.  Renee and Reita Cliche seem to think that after the patient has had some time to think about it, he is in much better spirits and feels better about his potential for recovery.  By the end of our conversation, patient seemed in pretty good spirits.  He was even joking a bit, wrote down that Gwynneth Munson was a redneck!  He also wrote down that God wants him to live.  There is still some concern with regard to CODE STATUS.  I let the patient know that, from my understanding, he would be very  unlikely to survive another round of resuscitative efforts/chest compression.  He states he would maybe like to try chest compressions if it came to it, IF THERE WAS A CHANCE HE COULD LIVE.  He wants to do everything he can to survive.  I said I would discuss with on-call surgeon.  I let patient and family know in no uncertain terms that if the attending physician believes that chest compressions would be futile and would only cause SUFFERING WITHOUT BENEFITt, we would NOT do chest compressions.  Please see [facility] futility policy. Patient and family were all in agreement with this.   As of time of my conversation with patient/family, I feel he has capacity to make informed decisions about his care.     SUMMARY  I believe I was able to clarify some misconceptions regarding comfort care versus CODE STATUS, reassured patient/family there is no need to make abrupt decisions in this matter.  Patient has expressed a will to live, he does not want comfort care, he is hopeful about his potential for recovery and wants to do everything he can to live.  As far as current CODE STATUS is concerned, if attending physician believes chest compressions would be a futile intervention, then we will NOT offer chest compressions in the event of an arrest.  As I explained to patient, he has expressed a desire to live and not be in pain.  If we are not able to save his life, we will at least to everything we can to ensure that he is not in any pain, even if he should suffer another arrest.         Sunnie Nielsen, DO, MA [place] Ethics Consult Service  Please see AMION for pager number

## 2020-02-21 NOTE — Progress Notes (Signed)
OT Cancellation Note  Patient Details Name: Tony Munoz MRN: [id number] DOB: October 18, 1955   Cancelled Treatment:    Reason Eval/Treat Not Completed: Medical issues which prohibited therapy; pt remains on FiO2 70-90% s/p cardiac arrest 6/15, not medically ready to participate in therapies. Will continue to follow.   Marcy Siren, OT Acute Rehabilitation Services Pager [phone] Office [phone]   [name] 02/21/2020, 8:10 AM

## 2020-02-21 NOTE — Progress Notes (Signed)
eLink Physician-Brief Progress Note Patient Name: [name] DOB: [birth year] MRN: [id number]   Date of Service  02/21/2020  HPI/Events of Note  Hypomagnesemia - Mg++ = 1.7 and Creatinine = 1.0.   eICU Interventions  Will replace Mg++.      Intervention Category Major Interventions: Electrolyte abnormality - evaluation and management  Kasumi Ditullio Eugene 02/21/2020, 5:57 AM

## 2020-02-21 NOTE — Progress Notes (Signed)
PT Cancellation Note  Patient Details Name: Tony Munoz MRN: [id number] DOB: [birth year]   Cancelled Treatment:    Reason Eval/Treat Not Completed: Patient not medically ready - s/p cardiac arrest on 6/15 at ~0400, now on FiO2 70-90%. Pt not medically ready, will check back as schedule allows.  Richrd Sox, PT Acute Rehabilitation Services Pager [phone]  Office [phone]    Tyrone Apple D Despina Hidden 02/21/2020, 8:03 AM

## 2020-02-21 NOTE — Progress Notes (Signed)
\  SLP Cancellation Note  Patient Details Name: Tony Munoz MRN: [id number] DOB: 1955-12-22   Cancelled treatment:       Reason Eval/Treat Not Completed:  (Pt on vent at this time. SLP will follow up. )  [name] I. Vear Clock, MS, CCC-SLP Acute Rehabilitation Services Office number [phone] Pager [phone]  [name] 02/21/2020, 8:20 AM

## 2020-02-22 LAB — BPAM RBC
Blood Product Expiration Date: 202107072359
ISSUE DATE / TIME: 202106171503
Unit Type and Rh: 6200

## 2020-02-22 LAB — BASIC METABOLIC PANEL
Anion gap: 12 (ref 5–15)
BUN: 39 mg/dL — ABNORMAL HIGH (ref 8–23)
CO2: 23 mmol/L (ref 22–32)
Calcium: 7.9 mg/dL — ABNORMAL LOW (ref 8.9–10.3)
Chloride: 111 mmol/L (ref 98–111)
Creatinine, Ser: 0.89 mg/dL (ref 0.61–1.24)
GFR calc Af Amer: >60 mL/min (ref >60)
GFR calc non Af Amer: >60 mL/min (ref >60)
Glucose, Bld: 141 mg/dL — ABNORMAL HIGH (ref 70–99)
Potassium: 3.8 mmol/L (ref 3.5–5.1)
Sodium: 146 mmol/L — ABNORMAL HIGH (ref 135–145)

## 2020-02-22 LAB — CBC WITH DIFFERENTIAL/PLATELET
Abs Immature Granulocytes: 0.03 10*3/uL (ref 0.00–0.07)
Basophils Absolute: 0 10*3/uL (ref 0.0–0.1)
Basophils Relative: 0 %
Eosinophils Absolute: 0 10*3/uL (ref 0.0–0.5)
Eosinophils Relative: 0 %
HCT: 23.5 % — ABNORMAL LOW (ref 39.0–52.0)
Hemoglobin: 7.3 g/dL — ABNORMAL LOW (ref 13.0–17.0)
Immature Granulocytes: 1 %
Lymphocytes Relative: 9 %
Lymphs Abs: 0.5 10*3/uL — ABNORMAL LOW (ref 0.7–4.0)
MCH: 29.7 pg (ref 26.0–34.0)
MCHC: 31.1 g/dL (ref 30.0–36.0)
MCV: 95.5 fL (ref 80.0–100.0)
Monocytes Absolute: 0.5 10*3/uL (ref 0.1–1.0)
Monocytes Relative: 8 %
Neutro Abs: 4.6 10*3/uL (ref 1.7–7.7)
Neutrophils Relative %: 82 %
Platelets: 246 10*3/uL (ref 150–400)
RBC: 2.46 MIL/uL — ABNORMAL LOW (ref 4.22–5.81)
RDW: 18.6 % — ABNORMAL HIGH (ref 11.5–15.5)
WBC: 5.6 10*3/uL (ref 4.0–10.5)
nRBC: 0 % (ref 0.0–0.2)

## 2020-02-22 LAB — PREPARE RBC (CROSSMATCH)

## 2020-02-22 LAB — TYPE AND SCREEN
ABO/RH(D): A POS
Antibody Screen: NEGATIVE
Unit division: 0

## 2020-02-22 LAB — GLUCOSE, CAPILLARY
Glucose-Capillary: 125 mg/dL — ABNORMAL HIGH (ref 70–99)
Glucose-Capillary: 127 mg/dL — ABNORMAL HIGH (ref 70–99)
Glucose-Capillary: 95 mg/dL (ref 70–99)
Glucose-Capillary: 95 mg/dL (ref 70–99)
Glucose-Capillary: 100 mg/dL — ABNORMAL HIGH (ref 70–99)

## 2020-02-22 LAB — CULTURE, RESPIRATORY W GRAM STAIN

## 2020-02-22 LAB — MAGNESIUM: Magnesium: 2.4 mg/dL (ref 1.7–2.4)

## 2020-02-22 MED ORDER — SODIUM CHLORIDE 0.9% IV SOLUTION: Freq: Once | INTRAVENOUS | Status: AC

## 2020-02-22 NOTE — Progress Notes (Signed)
Physical Therapy Treatment Patient Details Name: Tony Munoz MRN: [id number] DOB: Jan 22, 1956 Today's Date: 02/22/2020    History of Present Illness 64 y.o male presented for surgery 02/06/20 for CABGx4 , bilateral IMA,Left carotid endartectomy. Complications from ETOH, worsening respiratory function requiring bipap. Extubated 06/02, off bipap 06/04. 6/5 pt re-intubated d/t bil mucus plugs, bronch with removal of aspirated pill on bronchus intermedius, pt self-extubated 6/6, re-intubated 6/7 due to worsening hypoxia with respiratory distress and WOB, trach 6/10, PEA arrest 6/15. PMHx includes radiculopathy, HTN, dyspnea, smoker, carotid artery occulsion, anxiety.    PT Comments    Pt pleasant and willing to participate. When asked if he was ready to dance he wrote "physical therapy be happy lets get going I have a dance with [place]". Pt following single step commands and tolerating tilt to 45 degrees well with right fem aline limiting hip flexion and initial plan to attempt to sit EOB. Pt requires assist and direction for all transfers but is engaged and demonstrates willingness to progress mobility. Pt pleased with ability to tolerate tilt today.   FiO2 50% PRVC trach on vent HR 77-110 BP in tilt 142/87    Follow Up Recommendations  SNF     Equipment Recommendations  Other (comment) (TBD)    Recommendations for Other Services       Precautions / Restrictions Precautions Precautions: Sternal;Fall Precaution Comments: external pacemaker, cortrak, flexiseal, chest tube, rt femoral arterial line, trach, vent    Mobility  Bed Mobility Overal bed mobility: Needs Assistance Bed Mobility: Rolling Rolling: Mod assist         General bed mobility comments: mod assist with max cues and facilitation to roll bil for pericare and linen change due to soiled linens  Transfers Overall transfer level: Needs assistance               General transfer comment: used Kreg tilt bed  to achieve 45 degree tilt without use of strapping as pt able to maintain trunk and bil LE control. Total tilt 7 min with pt performing inclined squats at 35 degrees x 10.  Ambulation/Gait                 Stairs             Wheelchair Mobility    Modified Rankin (Stroke Patients Only)       Balance                                            Cognition Arousal/Alertness: Awake/alert Behavior During Therapy: WFL for tasks assessed/performed Overall Cognitive Status: Difficult to assess Area of Impairment: Orientation                 Orientation Level: Time Current Attention Level: Sustained           General Comments: pt mouthing words, writing statements and able to state one day off with orientation and fixated on constant use of oral suction      Exercises General Exercises - Lower Extremity Mini-Sqauts: AROM;Other (comment);10 reps (35 degree tilt bed)    General Comments        Pertinent Vitals/Pain Pain Assessment: Faces Pain Score: 3  Pain Location: neck Pain Descriptors / Indicators: Aching;Sore Pain Intervention(s): Limited activity within patient's tolerance;Monitored during session;Repositioned    Home Living  Prior Function            PT Goals (current goals can now be found in the care plan section) Acute Rehab PT Goals Time For Goal Achievement: 03/07/20 Potential to Achieve Goals: Good Progress towards PT goals: Goals downgraded-see care plan    Frequency    Min 2X/week      PT Plan Current plan remains appropriate    Co-evaluation              AM-PAC PT "6 Clicks" Mobility   Outcome Measure  Help needed turning from your back to your side while in a flat bed without using bedrails?: A Lot Help needed moving from lying on your back to sitting on the side of a flat bed without using bedrails?: Total Help needed moving to and from a bed to a chair (including  a wheelchair)?: Total Help needed standing up from a chair using your arms (e.g., wheelchair or bedside chair)?: Total Help needed to walk in hospital room?: Total Help needed climbing 3-5 steps with a railing? : Total 6 Click Score: 7    End of Session   Activity Tolerance: Patient tolerated treatment well Patient left: in bed;with call bell/phone within reach;with nursing/sitter in room Nurse Communication: Mobility status PT Visit Diagnosis: Other abnormalities of gait and mobility (R26.89);Difficulty in walking, not elsewhere classified (R26.2)     Time: 8315-1761 PT Time Calculation (min) (ACUTE ONLY): 44 min  Charges:  $Therapeutic Activity: 38-52 mins                     Damaree Sargent P, PT Acute Rehabilitation Services Pager: [phone] Office: [place] Kaari Zeigler 02/22/2020, 1:35 PM

## 2020-02-22 NOTE — Progress Notes (Signed)
SLP Cancellation Note  Patient Details Name: AMITAI DELAUGHTER MRN: [id number] DOB: Aug 29, 1956   Cancelled treatment:       Reason Eval/Treat Not Completed: Medical issues which prohibited therapy SLP has been following pt since 02/10/20. He was able to participate in two sessions, but was re-intubated and a trach was subsequently placed. Case was discussed with April, RT and [place], RN who indicated that the pt has not been able to be weaned from the vent despite attempts, that he coded on 2023-02-21, and that he has significant standing secretions. The possibility of SLP being able to being able to do an in-line Passy-Muir Speaking Valve (PMSV) was discussed but RT indicated that she would have concerns about that being done at this time. SLP will sign off but please re-consult when clinically indicated for swallowing/PMSV.   Jahnia Hewes I. Vear Clock, MS, CCC-SLP Acute Rehabilitation Services Office number [phone] Pager [phone]  [name] 02/22/2020, 4:07 PM

## 2020-02-22 NOTE — Consult Note (Signed)
Patient currently in critical care on Fentanyl for pain control and sedation. As per nurse patient recently coded and has 3 broken ribs. Psych was consulted for mental capacity. Will refer to note from Dr. Lyn Hollingshead recommendations. Patient has expressed his desire to live at this time. Please re-consult once patient is medically cleared and no longer on sedation as this will impair his mental status and capacity.

## 2020-02-22 NOTE — Progress Notes (Signed)
NAME:  Tony Munoz, MRN:  [id number], DOB:  [birth year], LOS: 57 ADMISSION DATE:  02/06/2020, CONSULTATION DATE:  02/08/20 REFERRING MD:  Orvan Seen - CVTS, CHIEF COMPLAINT:  SOB, hypoxia   Brief History   64 yo M with CAD, HTN, COPD admit for elective CABG and L CEA, with progressive respiratory failure with hypoxia and hypercarbia and intubated on POD 3. Self extubated 6/6 and required re-intubation 03-02-23 Coded early morning of 03-10-23, requiring CPR done for two rounds, 61m epinephrine given, 1 amp Bicarb. He was profoundly hypotensive at that point, so a push of epinephrine was given. Patient developed PEA a second time, achieving ROSC after 2 round of CPR, 2 mg Epi.   Past Medical History  EtOH use  CAD  Anxiety Tobacco use disorder HTN   Significant Hospital Events   6/1 CABG and L CEA  6/2 receiving BZD for possible EtOH withdrawals. Was on precedex  6/3 worsening respiratory status with worsening hypoxia. Received 40 mg lasix. PCCM consulted 6/5 intubated and bronch with removal of aspirated pill on bronchus intermedius 6/6 Self extubated  606/25/2024Worsening hypoxia with respiratory distress and WOB. Reintubated 6/10 tracheostomy  6/12-6/14 - PS weaning trials  6Jul 03, 2024PEA arrest  Consults:  Vascular surgery PCCM   Procedures:  6/1 CABG 6/1 L CEA  6/10 tracheostomy   Significant Diagnostic Tests:  Bronch 6/5: mucus plugs bilaterally, blue pill in BI   CXR 607/03/2024s/p code with improved aeration compared to prior CT Chest 6July 03, 2024with partial left lung collapse, airspace disease c/w aspiration vs pna   Micro Data:  6/5 BAL-Candida tropicalis 6/9 blood culture - no growth 62024/07/03blood culture- 62024/07/03trach aspirate- rare GNRs 607/03/24urine culture-   Antimicrobials:  Cefuroxime 6/1 (surg ppx)  vanc 6/1 Unasyn 6/4>> 6/12 Cefepime 62024/07/03>> Vancomycin 62024-07-03>>  Interim history/subjective:   Alert. Comfortable on vent and life support. Still with pressors going. Able to wean some. On  going goc discussions with family   Objective   Blood pressure (!) 136/96, pulse 90, temperature 98 F (36.7 C), temperature source Oral, resp. rate (!) 26, height _0  (1.753 m), weight 61.4 kg, SpO2 98 %.    Vent Mode: PRVC FiO2 (%):  [40 %-60 %] 50 % Set Rate:  [20 bmp-24 bmp] 20 bmp Vt Set:  [570 mL] 570 mL PEEP:  [8 cmH20] 8 cmH20 Plateau Pressure:  [19 cmH20-23 cmH20] 22 cmH20   Intake/Output Summary (Last 24 hours) at 02/22/2020 1007 Last data filed at 02/22/2020 0945 Gross per 24 hour  Intake 2873 ml  Output 1555 ml  Net 1318 ml   Filed Weights   02/21/20 0000 02/21/20 0406 02/22/20 0400  Weight: 59.6 kg 59.6 kg 61.4 kg    Examination:  General: elderly, cachextic male, on vent  HENT: pupils equal reactive, tracking  PULM: BL vented breaths CV: tachycardic, rrr, s1 s2  GI: soft, nt nd  MSK: muscle wasting present  Neuro: AAOX3, moves all 4 extremities  Ext: brusing left lower ext.   Labs: reviewed   CT Chest: partially collapsed L upper lung, asp vs pna , rib fractures  Echo: normal LV function   Assessment & Plan:   Acute hypoxic and hypercarbic respiratory failure Aspiration-right lower lobe foreign body found during bronchoscopy, Candida tropicalis on BAL culture. Hx tobacco use Severe bullous emphysema present on chest x-ray imaging Tracheostomy 6/10 Pneumothorax on left, tube in place S/p needle decompression during code on 607/03/2024CT Chest on 62024-07-03  with LUL collapse b/l airspace dz, rib fractures  Enterococcal HAP  P: - continue vanc and cefepime  - wean vent as tolerated  - too weak for pressure support at this time  - trach collar once able, but too weak today to try  - ct per TCTS   Cardic arrest- PEA - CPR x2 rounds x2 times - currently on 2 pressors - CXR slightly improved when compared to prior  - CT Chest with LUL collapse, b/l airspace disease, rib fractures   - CT head without acute change  - Echo with preserved EF  - DDx includes  ACS, hypoxia, sepsis, drug related  P: - support care - wean from pressors as tolerated - map goal >65 mmHg   Acute metabolic encephalopathy > improving - Had improved, thought to be caused by hypoxia, hypercarbia, possible medication related in setting of CIWA protocol meds and post-op pain meds - EtOH use disorder with possible EtOH withdrawals  - Acute worsening after code on 6/15, likely d/t to acute metabolic stress, hypoxia, sedating medications - CT head 6/15 negative for any acute change  P: - precedex - prn fent   CABG 6/1 L CEA 6/1 P: - asa + plavix   H/o HTN Afib Thrombocytopenia > improved Cachexia, frailty - continue PT if recovers, not escalating care at the moment  Best practice:  Diet: Tube feeds Pain/Anxiety/Delirium protocol (if indicated): n/a  VAP protocol (if indicated): In place DVT prophylaxis: per primary, post op  GI prophylaxis: protonix  Glucose control: SSI  Mobility: BR Code Status: partial  Family Communication: will update family Disposition: ICU   This patient is critically ill with multiple organ system failure; which, requires frequent high complexity decision making, assessment, support, evaluation, and titration of therapies. This was completed through the application of advanced monitoring technologies and extensive interpretation of multiple databases. During this encounter critical care time was devoted to patient care services described in this note for 32 minutes.  [facility] 02/22/2020 10:07 AM

## 2020-02-22 NOTE — Progress Notes (Signed)
TCTS BRIEF SICU PROGRESS NOTE  16 Days Post-Op  S/P Procedure(s) (LRB): CORONARY ARTERY BYPASS GRAFTING (CABG),  x Four with Bilateral IMAs, and right leg greater saphenous vein (N/A) TRANSESOPHAGEAL ECHOCARDIOGRAM (TEE) (N/A) INDOCYANINE GREEN FLUORESCENCE IMAGING (ICG) (N/A) CAROTID ENDARTERECTOMY LEFT with  Patch Angioplasty Using Hemashield Platinum Finesse patch (Left)   Stable day NSR w/ stable BP off all pressors O2 sats 94-100% UOP adequate  Plan: Continue current plan  Purcell Nails, MD 02/22/2020 6:52 PM

## 2020-02-22 NOTE — Progress Notes (Signed)
[address]       [place] [address]             [phone]      16 Days Post-Op Procedure(s) (LRB): CORONARY ARTERY BYPASS GRAFTING (CABG),  x Four with Bilateral IMAs, and right leg greater saphenous vein (N/A) TRANSESOPHAGEAL ECHOCARDIOGRAM (TEE) (N/A) INDOCYANINE GREEN FLUORESCENCE IMAGING (ICG) (N/A) CAROTID ENDARTERECTOMY LEFT with  Patch Angioplasty Using Hemashield Platinum Finesse patch (Left) Subjective: Alert, working with PT this am  Objective: Vital signs in last 24 hours: Temp:  [97.6 F (36.4 C)-98.6 F (37 C)] 98.1 F (36.7 C) (06/17 0733) Pulse Rate:  [37-116] 94 (06/17 0714) Cardiac Rhythm: Normal sinus rhythm (06/17 0400) Resp:  [14-32] 24 (06/17 0714) BP: (101-233)/(55-215) 138/77 (06/17 0600) SpO2:  [71 %-100 %] 91 % (06/17 0714) Arterial Line BP: (83-121)/(49-73) 92/55 (06/17 0615) FiO2 (%):  [40 %-60 %] 50 % (06/17 0714) Weight:  [61.4 kg] 61.4 kg (06/17 0400)   Vent Mode: PRVC FiO2 (%):  [40 %-60 %] 50 % Set Rate:  [20 bmp-24 bmp] 20 bmp Vt Set:  [570 mL] 570 mL PEEP:  [8 cmH20] 8 cmH20 Plateau Pressure:  [19 cmH20-23 cmH20] 22 cmH20    Hemodynamic parameters for last 24 hours:    Intake/Output from previous day: 06/16 0701 - 06/17 0700 In: 2313.7 [I.V.:1120.6; NG/GT:628; IV Piggyback:565.1] Out: 1455 [Urine:955; Chest Tube:500] Intake/Output this shift: Total I/O In: -  Out: 150 [Urine:150]  General appearance: alert, cooperative and stable on the vent Heart: regular rate and rhythm and tachy Lungs: coarse BS Abdomen: benign Extremities: no edema Wound: incis healing well  Lab Results: Recent Labs    02/21/20 0401 02/22/20 0331  WBC 7.4 5.6  HGB 7.7* 7.3*  HCT 24.2* 23.5*  PLT 273 246   BMET:  Recent Labs    02/21/20 0401 02/22/20 0331  NA 144 146*  K 3.8 3.8  CL 113* 111  CO2 23 23  GLUCOSE 111* 141*  BUN 38* 39*  CREATININE 1.00 0.89  CALCIUM 7.4* 7.9*    PT/INR: No results for  input(s): LABPROT, INR in the last 72 hours. ABG    Component Value Date/Time   PHART 7.441 02/21/2020 0302   HCO3 24.9 02/21/2020 0302   TCO2 26 02/21/2020 0302   ACIDBASEDEF 3.0 (H) 02/20/2020 0434   O2SAT 100.0 02/21/2020 0302   CBG (last 3)  Recent Labs    02/21/20 1947 02/22/20 0334 02/22/20 0731  GLUCAP 107* 125* 127*    Meds Scheduled Meds: . sodium chloride   Intravenous Once  . amiodarone  200 mg Per Tube BID  . arformoterol  15 mcg Nebulization BID  . aspirin  81 mg Per Tube Daily  . atorvastatin  10 mg Per Tube Daily  . bisacodyl  10 mg Oral Daily   Or  . bisacodyl  10 mg Rectal Daily  . budesonide (PULMICORT) nebulizer solution  0.25 mg Nebulization BID  . chlorhexidine gluconate (MEDLINE KIT)  15 mL Mouth Rinse BID  . Chlorhexidine Gluconate Cloth  6 each Topical Daily  . clopidogrel  75 mg Per Tube Daily  . collagenase   Topical Daily  . docusate  200 mg Per Tube Daily  . enoxaparin (LOVENOX) injection  40 mg Subcutaneous Daily  . feeding supplement (PRO-STAT SUGAR FREE 64)  30 mL Per Tube Daily  . folic acid  1 mg Intravenous Daily  . insulin aspart  0-24 Units Subcutaneous Q4H  .  mouth rinse  15 mL Mouth Rinse 10 times per day  . multivitamin with minerals  1 tablet Per Tube Daily  . pantoprazole (PROTONIX) IV  40 mg Intravenous Q24H  . revefenacin  175 mcg Nebulization Daily  . sodium chloride flush  10-40 mL Intracatheter Q12H  . sodium chloride flush  3 mL Intravenous Q12H  . thiamine  100 mg Per Tube Daily  . tobramycin (PF)  300 mg Nebulization BID   Continuous Infusions: . sodium chloride Stopped (02/07/20 1415)  . sodium chloride    . sodium chloride 10 mL/hr at 02/21/20 2235  . albumin human    . ceFEPime (MAXIPIME) IV 2 g (02/22/20 2703)  . feeding supplement (VITAL AF 1.2 CAL) 55 mL/hr at 02/22/20 0000  . fentaNYL infusion INTRAVENOUS 275 mcg/hr (02/22/20 0757)  . lactated ringers    . lactated ringers 20 mL/hr at 02/08/20 1831  .  midazolam Stopped (02/20/20 1819)  . norepinephrine (LEVOPHED) Adult infusion 13 mcg/min (02/22/20 0756)  . phenylephrine (NEO-SYNEPHRINE) Adult infusion Stopped (02/18/20 2230)  . vancomycin 166.7 mL/hr at 02/22/20 0600  . vasopressin (PITRESSIN) infusion - *FOR SHOCK* 0.04 Units/min (02/22/20 0641)   PRN Meds:.sodium chloride, albuterol, fentaNYL (SUBLIMAZE) injection, fentaNYL (SUBLIMAZE) injection, fentaNYL (SUBLIMAZE) injection, lactated ringers, metoprolol tartrate, midazolam, midazolam, ondansetron (ZOFRAN) IV, sodium chloride flush, sodium chloride flush  Xrays DG Chest 1 View  Result Date: 02/21/2020 CLINICAL DATA:  Follow-up left pneumothorax EXAM: CHEST  1 VIEW COMPARISON:  CT from the previous day. FINDINGS: Cardiac shadow is stable. Postsurgical changes are again seen. Tracheostomy tube, right-sided PICC line and feeding catheter are noted. Bilateral chest tubes are again seen and stable tiny left pneumothorax is noted although relatively stable from the prior CT examination. Patchy opacity is again noted in the lungs bilaterally although somewhat improved on the left when compared with the prior CT. Rib fractures are seen and stable. IMPRESSION: Tiny left pneumothorax is seen similar to that noted on prior exam. Some improved aeration in the left lung is noted. Stable patchy opacity predominately on the right. Tubes and lines as described. Electronically Signed   By: Inez Catalina M.D.   On: 02/21/2020 09:54   ECHOCARDIOGRAM LIMITED  Result Date: 02/20/2020    ECHOCARDIOGRAM LIMITED REPORT   Patient Name:   Tony Munoz Date of Exam: 02/20/2020 Medical Rec #:  [id number]       Height:       69.0 in Accession #:    [id number]      Weight:       123.5 lb Date of Birth:  20-May-1956       BSA:          1.683 m Patient Age:    64 years        BP:           113/87 mmHg Patient Gender: M               HR:           107 bpm. Exam Location:  Inpatient Procedure: Limited Echo, Cardiac Doppler and  Color Doppler Indications:    tamponade 423.3  History:        Patient has prior history of Echocardiogram examinations, most                 recent 02/12/2020. Cardiac arrest, Prior CABG; COPD.  Sonographer:    Johny Chess Referring Phys: [id number] PZWCHEN Z ATKINS  Sonographer Comments: Echo performed with  patient supine and on artificial respirator. IMPRESSIONS  1. Left ventricular ejection fraction, by estimation, is 60 to 65%. The left ventricle has normal function. The left ventricle demonstrates regional wall motion abnormalities (see scoring diagram/findings for description). Left ventricular diastolic function could not be evaluated. Wall motion is consistent with infarction in the distribution of the right coronary artery.  2. Right ventricular systolic function is normal. The right ventricular size is normal. There is moderately elevated pulmonary artery systolic pressure.  3. Left atrial size was moderately dilated.  4. The mitral valve is normal in structure. Severe mitral valve regurgitation. No evidence of mitral stenosis.  5. The aortic valve is normal in structure. Aortic valve regurgitation is not visualized. No aortic stenosis is present.  6. The inferior vena cava is normal in size with greater than 50% respiratory variability, suggesting right atrial pressure of 3 mmHg. Comparison(s): Compared to 01/15/2020, overall left ventriculart systolic function is better, but the pattern of regional wall motion abnormalities is unchanged. Severe mitral insufficiency (likely ischemic mechanism) is also unchanged. FINDINGS  Left Ventricle: Left ventricular ejection fraction, by estimation, is 60 to 65%. The left ventricle has normal function. The left ventricle demonstrates regional wall motion abnormalities. The left ventricular internal cavity size was normal in size. There is no left ventricular hypertrophy. Left ventricular diastolic function could not be evaluated due to atrial fibrillation.  LV Wall  Scoring: The basal inferolateral segment and basal inferior segment are akinetic. The mid inferolateral segment, mid inferior segment, and basal inferoseptal segment are hypokinetic. Wall motion is consistent with infarction in the distribution of the right coronary artery. Right Ventricle: The right ventricular size is normal. No increase in right ventricular wall thickness. Right ventricular systolic function is normal. There is moderately elevated pulmonary artery systolic pressure. The tricuspid regurgitant velocity is 3.28 m/s, and with an assumed right atrial pressure of 3 mmHg, the estimated right ventricular systolic pressure is 16.1 mmHg. Left Atrium: Left atrial size was moderately dilated. Right Atrium: Right atrial size was normal in size. Pericardium: There is no evidence of pericardial effusion. Mitral Valve: The mitral valve is normal in structure. Normal mobility of the mitral valve leaflets. Severe mitral valve regurgitation, with posteriorly-directed jet. No evidence of mitral valve stenosis. Tricuspid Valve: The tricuspid valve is normal in structure. Tricuspid valve regurgitation is mild . No evidence of tricuspid stenosis. Aortic Valve: The aortic valve is normal in structure. Aortic valve regurgitation is not visualized. No aortic stenosis is present. Pulmonic Valve: The pulmonic valve was normal in structure. Pulmonic valve regurgitation is not visualized. No evidence of pulmonic stenosis. Aorta: The aortic root is normal in size and structure. Venous: The inferior vena cava is normal in size with greater than 50% respiratory variability, suggesting right atrial pressure of 3 mmHg. IAS/Shunts: No atrial level shunt detected by color flow Doppler.  LEFT VENTRICLE PLAX 2D LVIDd:         5.20 cm LVIDs:         3.80 cm LV PW:         1.10 cm LV IVS:        1.00 cm LVOT diam:     2.00 cm LVOT Area:     3.14 cm  LEFT ATRIUM         Index LA diam:    4.70 cm 2.79 cm/m   AORTA Ao Root diam: 3.50 cm  TRICUSPID VALVE TR Peak grad:   43.0 mmHg TR Vmax:  328.00 cm/s  SHUNTS Systemic Diam: 2.00 cm Mihai Croitoru MD Electronically signed by Sanda Klein MD Signature Date/Time: 02/20/2020/9:43:25 AM    Final     Assessment/Plan: S/P Procedure(s) (LRB): CORONARY ARTERY BYPASS GRAFTING (CABG),  x Four with Bilateral IMAs, and right leg greater saphenous vein (N/A) TRANSESOPHAGEAL ECHOCARDIOGRAM (TEE) (N/A) INDOCYANINE GREEN FLUORESCENCE IMAGING (ICG) (N/A) CAROTID ENDARTERECTOMY LEFT with  Patch Angioplasty Using Hemashield Platinum Finesse patch (Left)  1 appears to be making clinical and physical improvements 2 trach/vent management as per PCCM - making some progress 3 will stop levo and wean levo off today 4 transfuse one unit of blood 5 renal fxn conts to be normal, fair UOP 6 TF's 7 cont therapies 8 will get psych /palliative care consults as we need to evaluate his wishes to terminate aggressive care  LOS: 16 days    John Giovanni PA-C Pager [phone] 02/22/2020

## 2020-02-22 NOTE — Progress Notes (Signed)
eLink Physician-Brief Progress Note Patient Name: Tony Munoz DOB: 1956/08/09 MRN: [id number]   Date of Service  02/22/2020  HPI/Events of Note  Request for AM lab orders.    eICU Interventions  Will order CBC with platelets, BMP and Mg++ level at 5 AM.      Intervention Category Major Interventions: Other:  Lenell Antu 02/22/2020, 3:22 AM

## 2020-02-22 NOTE — Progress Notes (Signed)
Physical Therapy Wound Treatment Patient Details  Name: Tony Munoz MRN: [id number] Date of Birth: 25-Aug-1956  Today's Date: 02/22/2020 Time: 2122-4825 Time Calculation (min): 35 min  Subjective  Subjective: Pt mouthing, asking for a rag Patient and Family Stated Goals: None stated Date of Onset:  (unknown) Prior Treatments: dressing change  Pain Score: 4/10; Premedicated  Wound Assessment  Pressure Injury 02/22/20 Sacrum Unstageable - Full thickness tissue loss in which the base of the injury is covered by slough (yellow, tan, gray, green or brown) and/or eschar (tan, brown or black) in the wound bed. (Active)  Wound Image   02/22/20 1003  Dressing Type ABD;Barrier Film (skin prep);Gauze (Comment);Moist to moist 02/22/20 1003  Dressing Changed;Dry;Clean;Intact 02/22/20 1003  Dressing Change Frequency Daily 02/22/20 1003  State of Healing Eschar 02/22/20 1003  Site / Wound Assessment Red;Yellow;Brown 02/22/20 1003  % Wound base Red or Granulating 20% 02/22/20 1003  % Wound base Yellow/Fibrinous Exudate 80% 02/22/20 1003  % Wound base Black/Eschar 0% 02/22/20 1003  % Wound base Other/Granulation Tissue (Comment) 0% 02/22/20 1003  Peri-wound Assessment Intact;Erythema (blanchable) 02/22/20 1003  Wound Length (cm) 2 cm 02/22/20 1003  Wound Width (cm) 2 cm 02/22/20 1003  Wound Depth (cm) 0.1 cm 02/22/20 1003  Wound Surface Area (cm^2) 4 cm^2 02/22/20 1003  Wound Volume (cm^3) 0.4 cm^3 02/22/20 1003  Drainage Amount None 02/22/20 1003  Drainage Description No odor 02/22/20 1003  Treatment Hydrotherapy (Pulse lavage);Packing (Saline gauze) 02/22/20 1003   Santyl applied to wound bed prior to applying dressing.     Hydrotherapy Pulsed lavage therapy - wound location: coccyx Pulsed Lavage with Suction (psi): 12 psi Pulsed Lavage with Suction - Normal Saline Used: 1000 mL Pulsed Lavage Tip: Tip with splash shield   Wound Assessment and Plan  Wound Therapy -  Assess/Plan/Recommendations Wound Therapy - Clinical Statement: Pt presents to hydrotherapy with unstageable pressure injury on coccyx. Pt tolerated pulsatile lavage and dressing change well. Unable to debride adherent, slippery yellow tissue. Will benefit from further hydrotherapy to cleanse wound, debride necrotic tissue, and decrease bioburden.  Wound Therapy - Functional Problem List: decreased mobility, malnutrition Factors Delaying/Impairing Wound Healing: Multiple medical problems;Immobility Hydrotherapy Plan: Debridement;Patient/family education;Pulsatile lavage with suction Wound Therapy - Frequency: 6X / week Wound Therapy - Follow Up Recommendations: Skilled nursing facility Wound Plan:  (see above)  Wound Therapy Goals- Improve the function of patient's integumentary system by progressing the wound(s) through the phases of wound healing (inflammation - proliferation - remodeling) by: Decrease Necrotic Tissue to: 50 Decrease Necrotic Tissue - Progress: Goal set today Increase Granulation Tissue to: 50 Increase Granulation Tissue - Progress: Goal set today Goals/treatment plan/discharge plan were made with and agreed upon by patient/family: Yes Time For Goal Achievement: 7 days Wound Therapy - Potential for Goals: Fair  Goals will be updated until maximal potential achieved or discharge criteria met.  Discharge criteria: when goals achieved, discharge from hospital, MD decision/surgical intervention, no progress towards goals, refusal/missing three consecutive treatments without notification or medical reason.  GP       Wyona Almas, PT, DPT Acute Rehabilitation Services Pager [phone] Office [phone]   Deno Etienne 02/22/2020, 10:13 AM

## 2020-02-23 LAB — CBC WITH DIFFERENTIAL/PLATELET
Abs Immature Granulocytes: 0.03 10*3/uL (ref 0.00–0.07)
Basophils Absolute: 0 10*3/uL (ref 0.0–0.1)
Basophils Relative: 0 %
Eosinophils Absolute: 0 10*3/uL (ref 0.0–0.5)
Eosinophils Relative: 1 %
HCT: 27.2 % — ABNORMAL LOW (ref 39.0–52.0)
Hemoglobin: 8.5 g/dL — ABNORMAL LOW (ref 13.0–17.0)
Immature Granulocytes: 1 %
Lymphocytes Relative: 11 %
Lymphs Abs: 0.5 10*3/uL — ABNORMAL LOW (ref 0.7–4.0)
MCH: 29.3 pg (ref 26.0–34.0)
MCHC: 31.3 g/dL (ref 30.0–36.0)
MCV: 93.8 fL (ref 80.0–100.0)
Monocytes Absolute: 0.4 10*3/uL (ref 0.1–1.0)
Monocytes Relative: 8 %
Neutro Abs: 3.3 10*3/uL (ref 1.7–7.7)
Neutrophils Relative %: 79 %
Platelets: 220 10*3/uL (ref 150–400)
RBC: 2.9 MIL/uL — ABNORMAL LOW (ref 4.22–5.81)
RDW: 19.1 % — ABNORMAL HIGH (ref 11.5–15.5)
WBC: 4.2 10*3/uL (ref 4.0–10.5)
nRBC: 0 % (ref 0.0–0.2)

## 2020-02-23 LAB — BASIC METABOLIC PANEL
Anion gap: 10 (ref 5–15)
BUN: 35 mg/dL — ABNORMAL HIGH (ref 8–23)
CO2: 22 mmol/L (ref 22–32)
Calcium: 8 mg/dL — ABNORMAL LOW (ref 8.9–10.3)
Chloride: 113 mmol/L — ABNORMAL HIGH (ref 98–111)
Creatinine, Ser: 0.78 mg/dL (ref 0.61–1.24)
GFR calc Af Amer: >60 mL/min (ref >60)
GFR calc non Af Amer: >60 mL/min (ref >60)
Glucose, Bld: 112 mg/dL — ABNORMAL HIGH (ref 70–99)
Potassium: 3.3 mmol/L — ABNORMAL LOW (ref 3.5–5.1)
Sodium: 145 mmol/L (ref 135–145)

## 2020-02-23 LAB — TYPE AND SCREEN
ABO/RH(D): A POS
Antibody Screen: NEGATIVE
Unit division: 0
Unit division: 0

## 2020-02-23 LAB — BPAM RBC
Blood Product Expiration Date: 202106232359
Blood Product Expiration Date: 202107072359
ISSUE DATE / TIME: 202106150547
ISSUE DATE / TIME: 202106170917
Unit Type and Rh: 6200
Unit Type and Rh: 6200

## 2020-02-23 LAB — GLUCOSE, CAPILLARY
Glucose-Capillary: 110 mg/dL — ABNORMAL HIGH (ref 70–99)
Glucose-Capillary: 112 mg/dL — ABNORMAL HIGH (ref 70–99)
Glucose-Capillary: 113 mg/dL — ABNORMAL HIGH (ref 70–99)
Glucose-Capillary: 114 mg/dL — ABNORMAL HIGH (ref 70–99)
Glucose-Capillary: 118 mg/dL — ABNORMAL HIGH (ref 70–99)
Glucose-Capillary: 125 mg/dL — ABNORMAL HIGH (ref 70–99)
Glucose-Capillary: 142 mg/dL — ABNORMAL HIGH (ref 70–99)
Glucose-Capillary: 99 mg/dL (ref 70–99)

## 2020-02-23 MED ORDER — JUVEN PO PACK
1.0000 | PACK | Freq: Two times a day (BID) | ORAL | Status: DC
  Administered 2020-02-23 – 2020-03-04 (×21): 1
  Filled 2020-02-23 (×21): qty 1

## 2020-02-23 MED ORDER — VITAL AF 1.2 CAL PO LIQD
1000.0000 mL | ORAL | Status: DC
  Administered 2020-02-23 – 2020-03-04 (×10): 1000 mL

## 2020-02-23 MED ORDER — ADULT MULTIVITAMIN LIQUID CH
15.0000 mL | Freq: Every day | ORAL | Status: DC
  Administered 2020-02-23 – 2020-03-01 (×8): 15 mL
  Filled 2020-02-23 (×9): qty 15

## 2020-02-23 MED ORDER — DIAZEPAM 2 MG PO TABS
2.0000 mg | ORAL_TABLET | Freq: Four times a day (QID) | ORAL | Status: DC
  Administered 2020-02-23 – 2020-02-24 (×4): 2 mg
  Filled 2020-02-23 (×4): qty 1

## 2020-02-23 MED ORDER — POTASSIUM CHLORIDE 20 MEQ/15ML (10%) PO SOLN
20.0000 meq | ORAL | Status: AC
  Administered 2020-02-23 (×3): 20 meq
  Filled 2020-02-23 (×3): qty 15

## 2020-02-23 MED ORDER — TOBRAMYCIN 300 MG/5ML IN NEBU
300.0000 mg | INHALATION_SOLUTION | Freq: Two times a day (BID) | RESPIRATORY_TRACT | Status: DC
  Administered 2020-02-23 – 2020-02-26 (×7): 300 mg via RESPIRATORY_TRACT
  Filled 2020-02-23 (×11): qty 5

## 2020-02-23 MED ORDER — CHLORHEXIDINE GLUCONATE CLOTH 2 % EX PADS
6.0000 | MEDICATED_PAD | Freq: Every day | CUTANEOUS | Status: DC
  Administered 2020-02-23 – 2020-03-04 (×8): 6 via TOPICAL

## 2020-02-23 MED ORDER — SODIUM CHLORIDE 0.9 % IV SOLN
INTRAVENOUS | Status: DC | PRN
  Administered 2020-02-23 – 2020-02-27 (×2): 500 mL via INTRAVENOUS
  Administered 2020-03-03: 250 mL via INTRAVENOUS

## 2020-02-23 MED ORDER — LEVALBUTEROL HCL 0.63 MG/3ML IN NEBU
0.6300 mg | INHALATION_SOLUTION | Freq: Four times a day (QID) | RESPIRATORY_TRACT | Status: AC
  Administered 2020-02-23 – 2020-02-26 (×12): 0.63 mg via RESPIRATORY_TRACT
  Filled 2020-02-23 (×12): qty 3

## 2020-02-23 MED ORDER — DIAZEPAM 2 MG PO TABS
2.0000 mg | ORAL_TABLET | Freq: Four times a day (QID) | ORAL | Status: DC
  Administered 2020-02-23: 2 mg via ORAL
  Filled 2020-02-23: qty 1

## 2020-02-23 NOTE — Progress Notes (Signed)
NAME:  Tony Munoz, MRN:  [id number], DOB:  11/02/1955, LOS: 17 ADMISSION DATE:  02/06/2020, CONSULTATION DATE:  02/08/20 REFERRING MD:  Orvan Seen - CVTS, CHIEF COMPLAINT:  SOB, hypoxia   Brief History   64 yo M with CAD, HTN, COPD admit for elective CABG and L CEA, with progressive respiratory failure with hypoxia and hypercarbia and intubated on POD 3. Self extubated 6/6 and required re-intubation 02/22/2023 Coded early morning of 03/02/2023, requiring CPR done for two rounds, 60m epinephrine given, 1 amp Bicarb. He was profoundly hypotensive at that point, so a push of epinephrine was given. Patient developed PEA a second time, achieving ROSC after 2 round of CPR, 2 mg Epi.   Past Medical History  EtOH use  CAD  Anxiety Tobacco use disorder HTN   Significant Hospital Events   6/1 CABG and L CEA  6/2 receiving BZD for possible EtOH withdrawals. Was on precedex  6/3 worsening respiratory status with worsening hypoxia. Received 40 mg lasix. PCCM consulted 6/5 intubated and bronch with removal of aspirated pill on bronchus intermedius 6/6 Self extubated  617-Jun-2024Worsening hypoxia with respiratory distress and WOB. Reintubated 6/10 tracheostomy  6/12-6/14 - PS weaning trials  6Jun 25, 2024PEA arrest 6/18 PS trial on vent support   Consults:  Vascular surgery PCCM   Procedures:  6/1 CABG 6/1 L CEA  6/10 tracheostomy   Significant Diagnostic Tests:  Bronch 6/5: mucus plugs bilaterally, blue pill in BI   CXR 625-Jun-2024s/p code with improved aeration compared to prior CT Chest 606/25/24with partial left lung collapse, airspace disease c/w aspiration vs pna   Micro Data:  6/5 BAL-Candida tropicalis 6/9 blood culture - no growth 62024-06-25blood culture- 606-25-2024trach aspirate- rare GNRs, enterobacter  625-Jun-2024urine culture-   Antimicrobials:  Cefuroxime 6/1 (surg ppx)  vanc 6/1 Unasyn 6/4>> 6/12 Cefepime 625-Jun-2024>> Vancomycin 606-25-2024>>  Interim history/subjective:   Alert. Comfortable on vent. Communicating by  writing.   Objective   Blood pressure 124/70, pulse 89, temperature 97.9 F (36.6 C), temperature source Oral, resp. rate 16, height _0  (1.753 m), weight 61.1 kg, SpO2 96 %.    Vent Mode: CPAP;PSV FiO2 (%):  [40 %-50 %] 40 % Set Rate:  [24 bmp] 24 bmp Vt Set:  [570 mL] 570 mL PEEP:  [5 cmH20-8 cmH20] 5 cmH20 Pressure Support:  [5 cmH20] 5 cmH20 Plateau Pressure:  [16 cmH20-22 cmH20] 16 cmH20   Intake/Output Summary (Last 24 hours) at 02/23/2020 0946 Last data filed at 02/23/2020 0900 Gross per 24 hour  Intake 3981.47 ml  Output 1485 ml  Net 2496.47 ml   Filed Weights   02/21/20 0406 02/22/20 0400 02/23/20 0437  Weight: 59.6 kg 61.4 kg 61.1 kg    Examination:  General: elderly male, cachetic, on vent  HENT: trach in place, tracking  PULM: BL vented breaths, diminished BL  CV: tachy, s1 s2,  GI: soft, nt nd  MSK: muscle wasting  Neuro: AAOx3  Ext: brusing from vein harvest left leg   Labs: reviewed   CXR: reviewed   Echo: normal LV function   Assessment & Plan:   Acute hypoxic and hypercarbic respiratory failure Aspiration-right lower lobe foreign body found during bronchoscopy, Candida tropicalis on BAL culture. Hx tobacco use Severe bullous emphysema present on chest x-ray imaging Tracheostomy 6/10 Pneumothorax on left, tube in place S/p needle decompression during code on 6June 25, 2024CT Chest on 62024-06-25with LUL collapse b/l airspace dz, rib fractures  Enterococcal HAP  P: - vanc + cefepime  - PS trial today  - if doing ok could consider tct later in afternoon  - ct management per TCTS   Cardic arrest- PEA - CPR x2 rounds x2 times - currently on 2 pressors - CXR slightly improved when compared to prior  - CT Chest with LUL collapse, b/l airspace disease, rib fractures   - CT head without acute change  - Echo with preserved EF  - DDx includes ACS, hypoxia, sepsis, drug related  P: - weaned off pressors at this time - observe, may need to restart, talked  with nursing - map goal 65   Acute metabolic encephalopathy > improving - Had improved, thought to be caused by hypoxia, hypercarbia, possible medication related in setting of CIWA protocol meds and post-op pain meds - EtOH use disorder with possible EtOH withdrawals  - Acute worsening after code on 6/15, likely d/t to acute metabolic stress, hypoxia, sedating medications - CT head 6/15 negative for any acute change  P: - fent and precedex  CABG 6/1 L CEA 6/1 P: - ASA + plavix   H/o HTN Afib Thrombocytopenia > improved Cachexia, frailty - continue PT if recovers, not escalating care at the moment  Best practice:  Diet: Tube feeds Pain/Anxiety/Delirium protocol (if indicated): n/a  VAP protocol (if indicated): In place DVT prophylaxis: per primary, post op  GI prophylaxis: protonix  Glucose control: SSI  Mobility: BR Code Status: partial  Family Communication: will update family Disposition: ICU   This patient is critically ill with multiple organ system failure; which, requires frequent high complexity decision making, assessment, support, evaluation, and titration of therapies. This was completed through the application of advanced monitoring technologies and extensive interpretation of multiple databases. During this encounter critical care time was devoted to patient care services described in this note for 32 minutes.  Tony Nash, DO [facility] 02/23/2020 9:53 AM

## 2020-02-23 NOTE — Progress Notes (Signed)
Palliative care consult received and chart reviewed in detail. I recommend before proceeding with any advanced care planning or significant alterations in the existing care plan ,that we have a formal palliative care meeting to include patient, his family and other key support persons to address his goals of care. I do not see that there is currently an ethical dilemma, only uncertainty about his future outcomes and potential trajectories following significant cardiothoracic surgery. We can discuss a variety of "what if" scenarios and support the best outcomes possible for him. Will arrange for a family meeting.  Anderson Malta, DO Palliative Medicine [phone]

## 2020-02-23 NOTE — Progress Notes (Signed)
Patient ID: Tony Munoz, male   DOB: 10-31-1955, 64 y.o.   MRN: [id number] [facility] Evening Rounds:  Hemodynamically stable  Remains on vent, fentanyl drip.  Urine output ok  Seen by palliative care today.

## 2020-02-23 NOTE — Progress Notes (Signed)
Physical Therapy Wound Treatment Patient Details  Name: Tony Munoz MRN: [id number] Date of Birth: 1956/08/11  Today's Date: 02/23/2020 Time: 0920-0942 Time Calculation (min): 22 min  Subjective  Subjective: Pt writing he was very tired  Patient and Family Stated Goals: None stated Date of Onset:  (unknown) Prior Treatments: dressing change  Pain Score: 6/10  Wound Assessment  Pressure Injury 02/22/20 Sacrum Unstageable - Full thickness tissue loss in which the base of the injury is covered by slough (yellow, tan, gray, green or brown) and/or eschar (tan, brown or black) in the wound bed. (Active)  Wound Image   02/22/20 1003  Dressing Type ABD;Barrier Film (skin prep);Gauze (Comment);Moist to moist 02/23/20 0949  Dressing Changed;Clean;Dry;Intact 02/23/20 0949  Dressing Change Frequency Daily 02/23/20 0949  State of Healing Non-healing 02/23/20 0949  Site / Wound Assessment Red;Yellow;Brown;Pale 02/23/20 0949  % Wound base Red or Granulating 20% 02/23/20 0949  % Wound base Yellow/Fibrinous Exudate 80% 02/23/20 0949  % Wound base Black/Eschar 0% 02/23/20 0949  % Wound base Other/Granulation Tissue (Comment) 0% 02/23/20 0949  Peri-wound Assessment Intact;Erythema (blanchable) 02/23/20 0949  Wound Length (cm) 2 cm 02/23/20 0949  Wound Width (cm) 2 cm 02/23/20 0949  Wound Depth (cm) 0.1 cm 02/23/20 0949  Wound Surface Area (cm^2) 4 cm^2 02/23/20 0949  Wound Volume (cm^3) 0.4 cm^3 02/23/20 0949  Drainage Amount Minimal 02/23/20 0949  Drainage Description Serosanguineous 02/23/20 0949  Treatment Debridement (Selective);Hydrotherapy (Pulse lavage);Packing (Saline gauze) 02/23/20 0949  Santyl applied to wound bed prior to applying dressing.   Hydrotherapy Pulsed lavage therapy - wound location: coccyx Pulsed Lavage with Suction (psi): 12 psi Pulsed Lavage with Suction - Normal Saline Used: 1000 mL Pulsed Lavage Tip: Tip with splash shield Selective Debridement Selective  Debridement - Location: coccyx Selective Debridement - Tools Used: Scalpel Selective Debridement - Tissue Removed: cross hatched yellow/brown tissue   Wound Assessment and Plan  Wound Therapy - Assess/Plan/Recommendations Wound Therapy - Clinical Statement: Yellow/brown necrotic tissue remains adherent; cross hatched with scalpel and applied Santyl. . Will benefit from further hydrotherapy to cleanse wound, debride necrotic tissue, and decrease bioburden.  Wound Therapy - Functional Problem List: decreased mobility, malnutrition Factors Delaying/Impairing Wound Healing: Multiple medical problems;Immobility Hydrotherapy Plan: Debridement;Patient/family education;Pulsatile lavage with suction Wound Therapy - Frequency: 6X / week Wound Therapy - Follow Up Recommendations: Skilled nursing facility Wound Plan:  (see above)  Wound Therapy Goals- Improve the function of patient's integumentary system by progressing the wound(s) through the phases of wound healing (inflammation - proliferation - remodeling) by: Decrease Necrotic Tissue to: 50 Decrease Necrotic Tissue - Progress: Progressing toward goal Increase Granulation Tissue to: 50 Increase Granulation Tissue - Progress: Progressing toward goal Goals/treatment plan/discharge plan were made with and agreed upon by patient/family: Yes Time For Goal Achievement: 7 days Wound Therapy - Potential for Goals: Fair  Goals will be updated until maximal potential achieved or discharge criteria met.  Discharge criteria: when goals achieved, discharge from hospital, MD decision/surgical intervention, no progress towards goals, refusal/missing three consecutive treatments without notification or medical reason.  GP       Wyona Almas, PT, DPT Acute Rehabilitation Services Pager [phone] Office [phone]  Deno Etienne 02/23/2020, 9:53 AM

## 2020-02-23 NOTE — Progress Notes (Signed)
17 Days Post-Op Procedure(s) (LRB): CORONARY ARTERY BYPASS GRAFTING (CABG),  x Four with Bilateral IMAs, and right leg greater saphenous vein (N/A) TRANSESOPHAGEAL ECHOCARDIOGRAM (TEE) (N/A) INDOCYANINE GREEN FLUORESCENCE IMAGING (ICG) (N/A) CAROTID ENDARTERECTOMY LEFT with  Patch Angioplasty Using Hemashield Platinum Finesse patch (Left) Subjective: "bad night"  Objective: Vital signs in last 24 hours: Temp:  [97.7 F (36.5 C)-98.6 F (37 C)] 97.8 F (36.6 C) (06/18 0000) Pulse Rate:  [31-110] 72 (06/18 0700) Cardiac Rhythm: Normal sinus rhythm (06/18 0000) Resp:  [17-33] 21 (06/18 0700) BP: (105-142)/(67-102) 108/70 (06/18 0700) SpO2:  [83 %-100 %] 100 % (06/18 0700) Arterial Line BP: (86-125)/(54-79) 89/56 (06/18 0700) FiO2 (%):  [40 %-50 %] 40 % (06/18 0310) Weight:  [61.1 kg] 61.1 kg (06/18 0437)  Hemodynamic parameters for last 24 hours:    Intake/Output from previous day: 06/17 0701 - 06/18 0700 In: 4704.2 [P.O.:1400; I.V.:1653.3; Blood:220; NG/GT:935; IV Piggyback:495.9] Out: 1585 [Urine:1180; Stool:80; Chest Tube:325] Intake/Output this shift: No intake/output data recorded.  General appearance: alert and cooperative Neurologic: intact Heart: regular rate and rhythm, S1, S2 normal, no murmur, click, rub or gallop Lungs: diminished breath sounds bilaterally Abdomen: soft, non-tender; bowel sounds normal; no masses,  no organomegaly Extremities: extremities normal, atraumatic, no cyanosis or edema Wound: dressed, dry  Lab Results: Recent Labs    02/22/20 0331 02/23/20 0552  WBC 5.6 4.2  HGB 7.3* 8.5*  HCT 23.5* 27.2*  PLT 246 220   BMET:  Recent Labs    02/22/20 0331 02/23/20 0552  NA 146* 145  K 3.8 3.3*  CL 111 113*  CO2 23 22  GLUCOSE 141* 112*  BUN 39* 35*  CREATININE 0.89 0.78  CALCIUM 7.9* 8.0*    PT/INR: No results for input(s): LABPROT, INR in the last 72 hours. ABG    Component Value Date/Time   PHART 7.441 02/21/2020 0302   HCO3  24.9 02/21/2020 0302   TCO2 26 02/21/2020 0302   ACIDBASEDEF 3.0 (H) 02/20/2020 0434   O2SAT 100.0 02/21/2020 0302   CBG (last 3)  Recent Labs    02/22/20 1948 02/23/20 0026 02/23/20 0443  GLUCAP 100* 99 113*    Assessment/Plan: S/P Procedure(s) (LRB): CORONARY ARTERY BYPASS GRAFTING (CABG),  x Four with Bilateral IMAs, and right leg greater saphenous vein (N/A) TRANSESOPHAGEAL ECHOCARDIOGRAM (TEE) (N/A) INDOCYANINE GREEN FLUORESCENCE IMAGING (ICG) (N/A) CAROTID ENDARTERECTOMY LEFT with  Patch Angioplasty Using Hemashield Platinum Finesse patch (Left) Mobilize consult palliative care  The reason for psychiatry consult was to assess for depression and anxiety which may be ICU related, but could also be an underlying/pre-surgical condition based on discussions with family   LOS: 17 days    [name] 02/23/2020

## 2020-02-23 NOTE — Progress Notes (Signed)
Pharmacy Antibiotic Note  Tony Munoz is a 64 y.o. male s/p PEA arrest this morning  Pharmacy has been consulted for vancomycin and cefepime dosing for possible PNA.  -WBC= 4, afebile   Tobra nebs 6/16>>  Cefepime 6/15>> Vanc 6/15>> 6/17 Unasyn 6/4 > 6/12 Cefuroxime 6/1>6/3 Vanc 6/1>6/1  6/5 BAL - 80k candida tropicalis 6/9 bldx2 - neg 6/15 resp-  enterobacter cloacae (sens cefepime, resis to CTX) 6/15 urine- neg 6/15 MRSA PCR- neg 6/15 bloodx 2- ngtd  Plan: Continue cefepime to 2gm IV q8 hours. Add tobramycin nebs F/u renal function, cultures and clinical course  Height: _0  (175.3 cm) Weight: 61.1 kg (134 lb 11.2 oz) IBW/kg (Calculated) : 70.7  Temp (24hrs), Avg:98 F (36.7 C), Min:97.7 F (36.5 C), Max:98.5 F (36.9 C)  Recent Labs  Lab 02/19/20 0308 02/19/20 0308 02/20/20 0420 02/20/20 0918 02/21/20 0401 02/22/20 0331 02/23/20 0552  WBC 8.7  --  16.5*  --  7.4 5.6 4.2  CREATININE 0.84   < > 1.20 0.90 1.00 0.89 0.78   < > = values in this interval not displayed.    Estimated Creatinine Clearance: 79.6 mL/min (by C-G formula based on SCr of 0.78 mg/dL).    No Known Allergies   Thank you for allowing pharmacy to be a part of this patient's care.  Erin Hearing PharmD., BCPS Clinical Pharmacist 02/23/2020 2:24 PM

## 2020-02-23 NOTE — Progress Notes (Signed)
Nutrition Follow-up  DOCUMENTATION CODES:   Severe malnutrition in context of chronic illness  INTERVENTION:   Tube Feeding:  Vital AF 1.2 at 65 ml/hr Provides 1872 kcals, 117 g of protein and 1264 mL of free water Meets 100% estimated calorie and protein needs  Add Juven BID, each packet provides 80 calories, 8 grams of carbohydrate, 2.5  grams of protein (collagen), 7 grams of L-arginine and 7 grams of L-glutamine; supplement contains CaHMB, Vitamins C, E, B12 and Zinc to promote wound healing  Recommend considering PEG placement for long term nutrition  D/C Pro-Stat 30 mL daily   NUTRITION DIAGNOSIS:   Severe Malnutrition related to chronic illness (cerebrovascular disease) as evidenced by severe fat depletion, severe muscle depletion.  Being addressed via TF   GOAL:   Patient will meet greater than or equal to 90% of their needs  Progressing   MONITOR:   Vent status, Diet advancement, TF tolerance, Skin, Weight trends, Labs  REASON FOR ASSESSMENT:   Rounds    ASSESSMENT:   Patient with PMH significant for HTN, severe cerebrovascular disease, CAD, and s/p multilevel C-spine fusion a few months ago. Presents this admission for CABG surgery.  6/01 CABG x 4, Left CEA 6/05 Intubated, Bronch with removal of aspirated pill  6/06 Self-Extubated 6/07 Re-Intubated, Cortrak 6/10 Trach placed 6/15 PEA cardiac arrest x 2, CT chest with LIL collapse b/ airspace dx, rib fractures 6/17 Palliative Care Consulted  Pt remains on vent support via trach. Attempting to wean as tolerated, per MD notes pt too weak for PS trials at this time. GOC discussions ongoing  Tolerating Vital AF 1.2 at 55 ml/hr with Pro-Stat 30 mL daily via Cortrak tube. Pt denies any abdominal pain or nausea.   Pt with unstageable PI to coccyx, 2cm x 2cm x unknown depth. Receiving hydrotherapy  Current weight 61.1 kg;  Net + 1.5 L. Weight pre-op CABG 6/02 59.7 kg.Weight on admit 6/01 51.7 kg, unsure of  accuracy. Unsure of actual dry weight. Weight down to 56 kg several days ago.    Labs: potassium 3.3 (L) Meds: thiamine, MVI with Minerals, KCl    Diet Order:   Diet Order            Diet NPO time specified  Diet effective now                 EDUCATION NEEDS:   Education needs have been addressed  Skin:  Skin Assessment: Skin Integrity Issues: Skin Integrity Issues:: Unstageable Stage II: sacrum Unstageable: coccyx Incisions: R leg, chest, neck  Last BM:  6/17 rectal tube  Height:   Ht Readings from Last 1 Encounters:  02/06/20 5\' 9"  (1.753 m)    Weight:   Wt Readings from Last 1 Encounters:  02/23/20 61.1 kg    BMI:  Body mass index is 19.89 kg/m.  Estimated Nutritional Needs:   Kcal:  1680-1960 kcals  Protein:  90-115 g  Fluid:  >/= 1.8 L/day   02/25/20 MS, RDN, LDN, CNSC Registered Dietitian III RD Pager and RD On-Call Pager Number Located in [place]

## 2020-02-23 NOTE — TOC Progression Note (Signed)
Transition of Care [facility]) - Progression Note    Patient Details  Name: Tony Munoz MRN: [id number] Date of Birth: Mar 16, 1956  Transition of Care [facility]) CM/SW Contact  Nonda Lou, [place] Phone Number: 02/23/2020, 2:56 PM  Clinical Narrative:    Initial plan of SNF placement at discharge. Palliative planning a formal meeting with family to discuss GOC. TOC team will continue to follow for disposition.   Expected Discharge Plan: Skilled Nursing Facility Barriers to Discharge: Continued Medical Work up  Expected Discharge Plan and Services Expected Discharge Plan: Skilled Nursing Facility       Living arrangements for the past 2 months: Single Family Home                                       Social Determinants of Health (SDOH) Interventions    Readmission Risk Interventions No flowsheet data found.

## 2020-02-23 NOTE — Progress Notes (Signed)
   02/23/20 1100  Clinical Encounter Type  Visited With Patient  Visit Type Follow-up  Stress Factors  Patient Stress Factors Exhausted;Major life changes   Chaplain engaged in follow-up visit with Daphine Deutscher.  Chaplain was able to provide some Advanced Directive education on 06/16, and will follow-up later today when Kailan's nephew, [name], arrives.  Daphine Deutscher relayed to chaplain that [name] is aware of what he desires if he becomes unable to speak for himself.    Chaplain is also awaiting clarity on psych consultation with ethics before proceeding with notarizing or finalizing Advanced Directive.  Chaplain will continue to offer support to [place] who seems to be vocalizing that he is tired and desires to be made comfortable.

## 2020-02-23 NOTE — Progress Notes (Signed)
Occupational Therapy Treatment Patient Details Name: Tony Munoz MRN: [id number] DOB: 1956/09/07 Today's Date: 02/23/2020    History of present illness 64 y.o male presented for surgery 02/06/20 for CABGx4 , bilateral IMA,Left carotid endartectomy. Complications from ETOH, worsening respiratory function requiring bipap. Extubated 06/02, off bipap 06/04. 6/5 pt re-intubated d/t bil mucus plugs, bronch with removal of aspirated pill on bronchus intermedius, pt self-extubated 6/6, re-intubated 6/7 due to worsening hypoxia with respiratory distress and WOB, trach 6/10, PEA arrest 6/15. PMHx includes radiculopathy, HTN, dyspnea, smoker, carotid artery occulsion, anxiety.   OT comments  Pt presents supine in bed, fatigued/lethargic today but is willing to participate in therapy session (RN reports not sleeping well last PM). Pt declined tilting this AM, but agreeable to bed level activity. Pt engaged in bed level grooming ADL with setup/supervision, using bil UE intermittently to engage in functional task. Pt participating in additional UE/LE there ex (within ROM parameters given sternal precautions and R fem Aline) with rest breaks throughout. Pt currently weaning on vent (40% FiO2 PEEP 5) with SpO2 briefly 88-89% with activity, overall maintaining >90%. BP start of session 129/78, end of session 110/79. RN aware of pt fatigue/lethargy this AM and reports plans to tilt (via tilt bed) later today if able. Have updated d/c recommendations to SNF at this time, will continue to monitor for pt progress. Acute OT to follow.    Follow Up Recommendations  SNF (pending progress)    Equipment Recommendations  Other (comment) (to be further assessed )       Precautions / Restrictions Precautions Precautions: Sternal;Fall Precaution Comments: external pacemaker, cortrak, flexiseal, chest tube, rt femoral arterial line, trach, vent Restrictions RUE Partial Weight Bearing Percentage or Pounds: sternal  precautions Other Position/Activity Restrictions: sternal precautions, external pacemaker        Mobility Bed Mobility               General bed mobility comments: pt declined tilting with Kreg bed today  Transfers Overall transfer level: Needs assistance               General transfer comment: used Kreg tilt bed to achieve 45 degree tilt without use of strapping as pt able to maintain trunk and bil LE control. Total tilt 7 min with pt performing inclined squats at 35 degrees x 10.        ADL either performed or assessed with clinical judgement   ADL Overall ADL's : Needs assistance/impaired     Grooming: Wash/dry face;Supervision/safety;Set up;Bed level Grooming Details (indicate cue type and reason): using L and RUE to perform task; pt using yonker PRN during session with assist only for locating item in his lap                               General ADL Comments: pt fatigued/lethargic today, declined tiling with Kreg bed but agreeable to  bed level activity, engaging in ther ex and simple grooming ADL with rest breaks throughout                       Cognition Arousal/Alertness: Awake/alert;Lethargic Behavior During Therapy: [facility] for tasks assessed/performed Overall Cognitive Status: Difficult to assess Area of Impairment: Following commands                   Current Attention Level: Sustained   Following Commands: Follows one step commands consistently;Follows one step commands with  increased time       General Comments: pt mouthing words, writing statements on clipboard, able to make basic needs known, sleepy this session (RN reports didn't sleep well last night)         Exercises Exercises: General Upper Extremity;General Lower Extremity;Low Level/ICU Total Joint Exercises Ankle Circles/Pumps: AROM;Both;20 reps;Supine Hip ABduction/ADduction: AROM;AAROM;Both;10 reps;Supine Straight Leg Raises: AROM;Left;10  reps;Supine General Exercises - Upper Extremity Shoulder Flexion: AROM;AAROM;Both;20 reps (limited within sternal precautions) Elbow Flexion: AROM;Both;10 reps;Supine Elbow Extension: AROM;Both;10 reps;Supine Digit Composite Flexion: AROM;Both;10 reps;Supine Composite Extension: AROM;Both;10 reps;Supine   Shoulder Instructions       General Comments bruising noted to lower aspect of RLE, incision appears clean/intact     Pertinent Vitals/ Pain       Pain Assessment: Faces Faces Pain Scale: Hurts little more Pain Location: generalized  Pain Descriptors / Indicators: Discomfort;Grimacing Pain Intervention(s): Monitored during session;Repositioned  Home Living                                          Prior Functioning/Environment              Frequency  Min 2X/week        Progress Toward Goals  OT Goals(current goals can now be found in the care plan section)  Progress towards OT goals: Progressing toward goals  Acute Rehab OT Goals Patient Stated Goal: none stated, agreeable to working with therapies OT Goal Formulation: With patient Time For Goal Achievement: 02/27/20 Potential to Achieve Goals: Fair ADL Goals Pt Will Perform Grooming: with min guard assist;sitting Additional ADL Goal #1: Pt will perform bed mobility with modA as precursor to EOB/OOB ADL. Additional ADL Goal #2: Pt will tolerate sitting EOB at minguard assist level >5 min as precursor to ADL. Additional ADL Goal #3: Pt will follow multi-step commands with 75% accuracy during ADL/functional task.  Plan Discharge plan needs to be updated    Co-evaluation                 AM-PAC OT "6 Clicks" Daily Activity     Outcome Measure   Help from another person eating meals?: Total (NPO) Help from another person taking care of personal grooming?: A Lot Help from another person toileting, which includes using toliet, bedpan, or urinal?: Total Help from another person bathing  (including washing, rinsing, drying)?: A Lot Help from another person to put on and taking off regular upper body clothing?: A Lot Help from another person to put on and taking off regular lower body clothing?: Total 6 Click Score: 9    End of Session Equipment Utilized During Treatment: Oxygen  OT Visit Diagnosis: Other abnormalities of gait and mobility (R26.89);Muscle weakness (generalized) (M62.81);Other symptoms and signs involving cognitive function   Activity Tolerance Patient tolerated treatment well;Patient limited by fatigue;Patient limited by lethargy   Patient Left in bed;with call bell/phone within reach   Nurse Communication Mobility status        Time: 8756-4332 OT Time Calculation (min): 31 min  Charges: OT General Charges $OT Visit: 1 Visit OT Treatments $Therapeutic Activity: 23-37 mins  Marcy Siren, OT Acute Rehabilitation Services Pager [phone] Office [phone]   [name] 02/23/2020, 10:08 AM

## 2020-02-23 NOTE — Discharge Instructions (Signed)
Coronary Artery Bypass Grafting, Care After This sheet gives you information about how to care for yourself after your procedure. Your doctor may also give you more specific instructions. If you have problems or questions, call your doctor. What can I expect after the procedure? After the procedure, it is common to:  Feel sick to your stomach (nauseous).  Not want to eat as much as normal (lack of appetite).  Have trouble pooping (constipation).  Have weakness and tiredness (fatigue).  Feel sad (depressed) or grouchy (irritable).  Have pain or discomfort around the cuts from surgery (incisions). Follow these instructions at home: Medicines  Take over-the-counter and prescription medicines only as told by your doctor. Do not stop taking medicines or start any new medicines unless your doctor says it is okay.  If you were prescribed an antibiotic medicine, take it as told by your doctor. Do not stop taking the antibiotic even if you start to feel better. Incision care   Follow instructions from your doctor about how to take care of your cuts from surgery. Make sure you: ? Wash your hands with soap and water before and after you change your bandage (dressing). If you cannot use soap and water, use hand sanitizer. ? Change your bandage as told by your doctor. ? Leave stitches (sutures), skin glue, or skin tape (adhesive) strips in place. They may need to stay in place for 2 weeks or longer. If tape strips get loose and curl up, you may trim the loose edges. Do not remove tape strips completely unless your doctor says it is okay.  Make sure the surgery cuts are clean, dry, and protected.  Check your cut areas every day for signs of infection. Check for: ? More redness, swelling, or pain. ? More fluid or blood. ? Warmth. ? Pus or a bad smell.  If cuts were made in your legs: ? Avoid crossing your legs. ? Avoid sitting for long periods of time. Change positions every 30  minutes. ? Raise (elevate) your legs when you are sitting. Bathing  Do not take baths, swim, or use a hot tub until your doctor says it is okay.  You may shower. Pat the surgery cuts dry. Do not rub the cuts to dry. Eating and drinking   Eat foods that are high in fiber, such as beans, nuts, whole grains, and raw fruits and vegetables. Any meats you eat should be lean cut. Avoid canned, processed, and fried foods. This can help prevent trouble pooping. This is also a part of a heart-healthy diet.  Drink enough fluid to keep your pee (urine) pale yellow.  Do not drink alcohol until you are fully recovered. Ask your doctor when it is safe to drink alcohol. Activity  Rest and limit your activity as told by your doctor. You may be told to: ? Stop any activity right away if you have chest pain, shortness of breath, irregular heartbeats, or dizziness. Get help right away if you have any of these symptoms. ? Move around often for short periods or take short walks as told by your doctor. Slowly increase your activities. ? Avoid lifting, pushing, or pulling anything that is heavier than 10 lb (4.5 kg) for at least 6 weeks or as told by your doctor.  Do physical therapy or a cardiac rehab (cardiac rehabilitation) program as told by your doctor. ? Physical therapy involves doing exercises to maintain movement and build strength and endurance. ? A cardiac rehab program includes:  Exercise  training.  Education.  Counseling.  Do not drive until your doctor says it is okay.  Ask your doctor when you can go back to work.  Ask your doctor when you can be sexually active. General instructions  Do not drive or use heavy machinery while taking prescription pain medicine.  Do not use any products that contain nicotine or tobacco. These include cigarettes, e-cigarettes, and chewing tobacco. If you need help quitting, ask your doctor.  Take 2-3 deep breaths every few hours during the day while  you get better. This helps expand your lungs and prevent problems.  If you were given a device called an incentive spirometer, use it several times a day to practice deep breathing. Support your chest with a pillow or your arms when you take deep breaths or cough.  Wear compression stockings as told by your doctor.  Weigh yourself every day. This helps to see if your body is holding (retaining) fluid that may make your heart and lungs work harder.  Keep all follow-up visits as told by your doctor. This is important. Contact a doctor if:  You have more redness, swelling, or pain around any cut.  You have more fluid or blood coming from any cut.  Any cut feels warm to the touch.  You have pus or a bad smell coming from any cut.  You have a fever.  You have swelling in your ankles or legs.  You have pain in your legs.  You gain 2 lb (0.9 kg) or more a day.  You feel sick to your stomach or you throw up (vomit).  You have watery poop (diarrhea). Get help right away if:  You have chest pain that goes to your jaw or arms.  You are short of breath.  You have a fast or irregular heartbeat.  You notice a "clicking" in your breastbone (sternum) when you move.  You have any signs of a stroke. "BE FAST" is an easy way to remember the main warning signs: ? B - Balance. Signs are dizziness, sudden trouble walking, or loss of balance. ? E - Eyes. Signs are trouble seeing or a change in how you see. ? F - Face. Signs are sudden weakness or loss of feeling of the face, or the face or eyelid drooping on one side. ? A - Arms. Signs are weakness or loss of feeling in an arm. This happens suddenly and usually on one side of the body. ? S - Speech. Signs are sudden trouble speaking, slurred speech, or trouble understanding what people say. ? T - Time. Time to call emergency services. Write down what time symptoms started.  You have other signs of a stroke, such as: ? A sudden, very bad  headache with no known cause. ? Feeling sick to your stomach. ? Throwing up. ? Jerky movements you cannot control (seizure). These symptoms may be an emergency. Do not wait to see if the symptoms will go away. Get medical help right away. Call your local emergency services (911 in the [place] Do not drive yourself to the hospital. Summary  After the procedure, it is common to have pain or discomfort in the cuts from surgery (incisions).  Do not take baths, swim, or use a hot tub until your doctor says it is okay.  Slowly increase your activities. You may need physical therapy or cardiac rehab.  Weigh yourself every day. This helps to see if your body is holding fluid. This information is not intended  to replace advice given to you by your health care provider. Make sure you discuss any questions you have with your health care provider. Document Revised: 05/03/2018 Document Reviewed: 05/03/2018 Elsevier Patient Education  2020 [employer].  [facility]  Discharge Instructions   Carotid Endarterectomy (CEA)  Please refer to the following instructions for your post-procedure care. Your surgeon or physician assistant will discuss any changes with you.  Activity  You are encouraged to walk as much as you can. You can slowly return to normal activities but must avoid strenuous activity and heavy lifting until your doctor tell you it's okay. Avoid activities such as vacuuming or swinging a golf club. You can drive after one week if you are comfortable and you are no longer taking prescription pain medications. It is normal to feel tired for serval weeks after your surgery. It is also normal to have difficulty with sleep habits, eating, and bowel movements after surgery. These will go away with time.  Bathing/Showering  Shower daily after you go home. Do not soak in a bathtub, hot tub, or swim until the incision heals completely.  Incision Care  Shower  every day. Clean your incision with mild soap and water. Pat the area dry with a clean towel. You do not need a bandage unless otherwise instructed. Do not apply any ointments or creams to your incision. You may have skin glue on your incision. Do not peel it off. It will come off on its own in about one week. Your incision may feel thickened and raised for several weeks after your surgery. This is normal and the skin will soften over time.   For Men Only: It's okay to shave around the incision but do not shave the incision itself for 2 weeks. It is common to have numbness under your chin that could last for several months.  Diet  Resume your normal diet. There are no special food restrictions following this procedure. A low fat/low cholesterol diet is recommended for all patients with vascular disease. In order to heal from your surgery, it is CRITICAL to get adequate nutrition. Your body requires vitamins, minerals, and protein. Vegetables are the best source of vitamins and minerals. Vegetables also provide the perfect balance of protein. Processed food has little nutritional value, so try to avoid this.  Medications  Resume taking all of your medications unless your doctor or physician assistant tells you not to. If your incision is causing pain, you may take over-the- counter pain relievers such as acetaminophen (Tylenol). If you were prescribed a stronger pain medication, please be aware these medications can cause nausea and constipation. Prevent nausea by taking the medication with a snack or meal. Avoid constipation by drinking plenty of fluids and eating foods with a high amount of fiber, such as fruits, vegetables, and grains.  Do not take Tylenol if you are taking prescription pain medications.  Follow Up  Our office will schedule a follow up appointment 2-3 weeks following discharge.  Please call us immediately for any of the following conditions  . Increased pain, redness, drainage  (pus) from your incision site. . Fever of 101 degrees or higher. . If you should develop stroke (slurred speech, difficulty swallowing, weakness on one side of your body, loss of vision) you should call 911 and go to the nearest emergency room. .  Reduce your risk of vascular disease:  . Stop smoking. If you would like help call [facility] at 1-800-QUIT-NOW ((586)122-7182) or  [place] at [phone]. . Manage your cholesterol . Maintain a desired weight . Control your diabetes . Keep your blood pressure down .  If you have any questions, please call the office at [phone].

## 2020-02-24 ENCOUNTER — Inpatient Hospital Stay (HOSPITAL_COMMUNITY): Payer: Medicare HMO

## 2020-02-24 DIAGNOSIS — Z930 Tracheostomy status: Secondary | ICD-10-CM

## 2020-02-24 LAB — GLUCOSE, CAPILLARY
Glucose-Capillary: 115 mg/dL — ABNORMAL HIGH (ref 70–99)
Glucose-Capillary: 85 mg/dL (ref 70–99)
Glucose-Capillary: 120 mg/dL — ABNORMAL HIGH (ref 70–99)
Glucose-Capillary: 122 mg/dL — ABNORMAL HIGH (ref 70–99)
Glucose-Capillary: 132 mg/dL — ABNORMAL HIGH (ref 70–99)
Glucose-Capillary: 119 mg/dL — ABNORMAL HIGH (ref 70–99)

## 2020-02-24 LAB — BASIC METABOLIC PANEL
Anion gap: 7 (ref 5–15)
BUN: 34 mg/dL — ABNORMAL HIGH (ref 8–23)
CO2: 25 mmol/L (ref 22–32)
Calcium: 8.3 mg/dL — ABNORMAL LOW (ref 8.9–10.3)
Chloride: 113 mmol/L — ABNORMAL HIGH (ref 98–111)
Creatinine, Ser: 0.71 mg/dL (ref 0.61–1.24)
GFR calc Af Amer: >60 mL/min (ref >60)
GFR calc non Af Amer: >60 mL/min (ref >60)
Glucose, Bld: 127 mg/dL — ABNORMAL HIGH (ref 70–99)
Potassium: 4 mmol/L (ref 3.5–5.1)
Sodium: 145 mmol/L (ref 135–145)

## 2020-02-24 LAB — CBC
HCT: 27.9 % — ABNORMAL LOW (ref 39.0–52.0)
Hemoglobin: 8.6 g/dL — ABNORMAL LOW (ref 13.0–17.0)
MCH: 29.3 pg (ref 26.0–34.0)
MCHC: 30.8 g/dL (ref 30.0–36.0)
MCV: 94.9 fL (ref 80.0–100.0)
Platelets: 193 10*3/uL (ref 150–400)
RBC: 2.94 MIL/uL — ABNORMAL LOW (ref 4.22–5.81)
RDW: 18.3 % — ABNORMAL HIGH (ref 11.5–15.5)
WBC: 5 10*3/uL (ref 4.0–10.5)
nRBC: 0.6 % — ABNORMAL HIGH (ref 0.0–0.2)

## 2020-02-24 MED ORDER — DIAZEPAM 5 MG PO TABS: 5.0000 mg | ORAL_TABLET | Freq: Two times a day (BID) | ORAL | Status: DC

## 2020-02-24 MED ORDER — DIAZEPAM 5 MG PO TABS
5.0000 mg | ORAL_TABLET | Freq: Two times a day (BID) | ORAL | Status: DC
  Administered 2020-02-24 – 2020-02-25 (×4): 5 mg
  Filled 2020-02-24 (×4): qty 1

## 2020-02-24 MED ORDER — MELATONIN 5 MG PO TABS
5.0000 mg | ORAL_TABLET | Freq: Every evening | ORAL | Status: DC | PRN
  Administered 2020-02-24 – 2020-03-04 (×5): 5 mg
  Filled 2020-02-24 (×5): qty 1

## 2020-02-24 NOTE — Progress Notes (Signed)
eLink Physician-Brief Progress Note Patient Name: Tony Munoz DOB: Sep 20, 1955 MRN: [id number]   Date of Service  02/24/2020  HPI/Events of Note  Patient complaining of insomnia  eICU Interventions  Melatonin 5 mg via  NG tube Q HS PRN insomnia        Migdalia Dk 02/24/2020, 12:35 AM

## 2020-02-24 NOTE — Progress Notes (Signed)
Physical Therapy Wound Treatment Patient Details  Name: Tony Munoz MRN: [id number] Date of Birth: September 19, 1955  Today's Date: 02/24/2020 Time: 1583-0940 Time Calculation (min): 33 min  Subjective  Subjective: Pt agreeable to hydrotherapy; writes he slept better last night Patient and Family Stated Goals: None stated Date of Onset:  (unknown) Prior Treatments: dressing change  Pain Score: 6/10  Wound Assessment  Pressure Injury 02/13/20 Sacrum Unstageable - Full thickness tissue loss in which the base of the injury is covered by slough (yellow, tan, gray, green or brown) and/or eschar (tan, brown or black) in the wound bed. (Active)  Dressing Type ABD;Barrier Film (skin prep);Moist to dry 02/24/20 0932  Dressing Changed;Dry;Clean;Intact 02/24/20 0932  Dressing Change Frequency Daily 02/24/20 0932  State of Healing Early/partial granulation 02/24/20 0932  Site / Wound Assessment Pale;Pink;Red;Yellow;Brown 02/24/20 0932  % Wound base Red or Granulating 40% 02/24/20 0932  % Wound base Yellow/Fibrinous Exudate 60% 02/24/20 0932  % Wound base Black/Eschar 0% 02/24/20 0932  % Wound base Other/Granulation Tissue (Comment) 0% 02/24/20 0932  Peri-wound Assessment Intact;Erythema (blanchable) 02/24/20 0932  Wound Length (cm) 2 cm 02/23/20 0949  Wound Width (cm) 2 cm 02/23/20 0949  Wound Depth (cm) 0.1 cm 02/23/20 0949  Wound Surface Area (cm^2) 4 cm^2 02/23/20 0949  Wound Volume (cm^3) 0.4 cm^3 02/23/20 0949  Drainage Amount Scant 02/24/20 0932  Drainage Description Serosanguineous 02/24/20 0932  Treatment Debridement (Selective);Hydrotherapy (Pulse lavage);Packing (Saline gauze) 02/24/20 0932   Santyl applied to wound bed prior to applying dressing.    Hydrotherapy Pulsed lavage therapy - wound location: coccyx Pulsed Lavage with Suction (psi): 12 psi Pulsed Lavage with Suction - Normal Saline Used: 1000 mL Pulsed Lavage Tip: Tip with splash shield Selective Debridement Selective  Debridement - Location: coccyx Selective Debridement - Tools Used: Scalpel Selective Debridement - Tissue Removed: cross hatched yellow/brown tissue   Wound Assessment and Plan  Wound Therapy - Assess/Plan/Recommendations Wound Therapy - Clinical Statement: Yellow/brown tissue receding with continued Santyl use; cross hatched tissue with scalpel. Pt with increased difficulty tolerating side lying position today although oxygen saturations remained > 90%. Will benefit from further hydrotherapy to cleanse wound, debride necrotic tissue, and decrease bioburden.  Wound Therapy - Functional Problem List: decreased mobility, malnutrition Factors Delaying/Impairing Wound Healing: Multiple medical problems;Immobility Hydrotherapy Plan: Debridement;Patient/family education;Pulsatile lavage with suction Wound Therapy - Frequency: 6X / week Wound Therapy - Follow Up Recommendations: Skilled nursing facility Wound Plan:  (see above)  Wound Therapy Goals- Improve the function of patient's integumentary system by progressing the wound(s) through the phases of wound healing (inflammation - proliferation - remodeling) by: Decrease Necrotic Tissue to: 50 Decrease Necrotic Tissue - Progress: Progressing toward goal Increase Granulation Tissue to: 50 Increase Granulation Tissue - Progress: Progressing toward goal Goals/treatment plan/discharge plan were made with and agreed upon by patient/family: Yes Time For Goal Achievement: 7 days Wound Therapy - Potential for Goals: Fair  Goals will be updated until maximal potential achieved or discharge criteria met.  Discharge criteria: when goals achieved, discharge from hospital, MD decision/surgical intervention, no progress towards goals, refusal/missing three consecutive treatments without notification or medical reason.  GP      Wyona Almas, PT, DPT Acute Rehabilitation Services Pager [phone] Office [phone]   Deno Etienne 02/24/2020,  9:34 AM

## 2020-02-24 NOTE — Progress Notes (Signed)
18 Days Post-Op Procedure(s) (LRB): CORONARY ARTERY BYPASS GRAFTING (CABG),  x Four with Bilateral IMAs, and right leg greater saphenous vein (N/A) TRANSESOPHAGEAL ECHOCARDIOGRAM (TEE) (N/A) INDOCYANINE GREEN FLUORESCENCE IMAGING (ICG) (N/A) CAROTID ENDARTERECTOMY LEFT with  Patch Angioplasty Using Hemashield Platinum Finesse patch (Left) Subjective: trached on vent, fentanyl drip but awake and alert suctioning his mouth.  Objective: Vital signs in last 24 hours: Temp:  [96.4 F (35.8 C)-97.7 F (36.5 C)] 97.5 F (36.4 C) (06/19 0743) Pulse Rate:  [59-110] 94 (06/19 0800) Cardiac Rhythm: Normal sinus rhythm (06/19 0400) Resp:  [12-29] 23 (06/19 0800) BP: (92-150)/(62-131) 150/95 (06/19 0800) SpO2:  [91 %-100 %] 96 % (06/19 0800) Arterial Line BP: (72-131)/(44-76) 122/76 (06/19 0800) FiO2 (%):  [40 %] 40 % (06/19 0400) Weight:  [60.9 kg] 60.9 kg (06/19 0600)  Hemodynamic parameters for last 24 hours:    Intake/Output from previous day: 06/18 0701 - 06/19 0700 In: 3707.7 [I.V.:1031.1; NG/GT:2377.8; IV Piggyback:298.8] Out: 2340 [Urine:1580; Stool:150; Chest Tube:390] Intake/Output this shift: No intake/output data recorded.  General appearance: alert, cooperative and looks uncomfortable Neurologic: intact Heart: regular rate and rhythm Lungs: diminished breath sounds bilaterally, accessory muscle use. Abdomen: soft, non-tender; bowel sounds normal Extremities: no edema Wound: incision ok  Lab Results: Recent Labs    02/23/20 0552 02/24/20 0359  WBC 4.2 5.0  HGB 8.5* 8.6*  HCT 27.2* 27.9*  PLT 220 193   BMET:  Recent Labs    02/23/20 0552 02/24/20 0359  NA 145 145  K 3.3* 4.0  CL 113* 113*  CO2 22 25  GLUCOSE 112* 127*  BUN 35* 34*  CREATININE 0.78 0.71  CALCIUM 8.0* 8.3*    PT/INR: No results for input(s): LABPROT, INR in the last 72 hours. ABG    Component Value Date/Time   PHART 7.441 02/21/2020 0302   HCO3 24.9 02/21/2020 0302   TCO2 26  02/21/2020 0302   ACIDBASEDEF 3.0 (H) 02/20/2020 0434   O2SAT 100.0 02/21/2020 0302   CBG (last 3)  Recent Labs    02/23/20 2332 02/24/20 0404 02/24/20 0742  GLUCAP 114* 115* 85   CXR: bilateral air space disease stable  Assessment/Plan: S/P Procedure(s) (LRB): CORONARY ARTERY BYPASS GRAFTING (CABG),  x Four with Bilateral IMAs, and right leg greater saphenous vein (N/A) TRANSESOPHAGEAL ECHOCARDIOGRAM (TEE) (N/A) INDOCYANINE GREEN FLUORESCENCE IMAGING (ICG) (N/A) CAROTID ENDARTERECTOMY LEFT with  Patch Angioplasty Using Hemashield Platinum Finesse patch (Left)  Hemodynamically stable on low dose NE 2 mcg off and on.  Maintaining sinus rhythm on amio.  Acute hypoxic and hypercarbic respiratory failure due to aspiration pneumonia. Continue vent wean as tolerated per CCM.  Continue Maxipime for aspiration pneumonia.  Keep chest tube in for now.     LOS: 18 days    Alleen Borne 02/24/2020

## 2020-02-24 NOTE — Progress Notes (Signed)
   Palliative Medicine Inpatient Follow Up Note    I was able to call patients sister, Tony Munoz this morning. She plans on coming in with her husband, Tony Munoz and Taber's cousin, Tony Munoz for a family meeting tomorrow at [employer].  Time Spent: No sharge ______________________________________________________________________________________ Tony Munoz Palliative Medicine Team Team Cell Phone: [phone] Please utilize secure chat with additional questions, if there is no response within 30 minutes please call the above phone number  Palliative Medicine Team providers are available by phone from 7am to 7pm daily and can be reached through the team cell phone.  Should this patient require assistance outside of these hours, please call the patient's attending physician.

## 2020-02-24 NOTE — Progress Notes (Signed)
Patient ID: Tony Munoz, male   DOB: 18-Jan-1956, 64 y.o.   MRN: [id number] TCTS Evening Rounds:  Hemodynamically stable  Weaned for 3 hrs on 5/5 but had increased work of breathing and put back on vent.  Urine output ok.

## 2020-02-24 NOTE — Progress Notes (Signed)
NAME:  Tony Munoz, MRN:  [id number], DOB:  [birth year], LOS: 69 ADMISSION DATE:  02/06/2020, CONSULTATION DATE:  02/08/20 REFERRING MD:  Orvan Seen - CVTS, CHIEF COMPLAINT:  SOB, hypoxia   Brief History   64 yo M with CAD, HTN, COPD admit for elective CABG and L CEA, with progressive respiratory failure with hypoxia and hypercarbia and intubated on POD 3. Self extubated 6/6 and required re-intubation 22-Feb-2023 Coded early morning of 03-02-2023, requiring CPR done for two rounds, 6m epinephrine given, 1 amp Bicarb. He was profoundly hypotensive at that point, so a push of epinephrine was given. Patient developed PEA a second time, achieving ROSC after 2 round of CPR, 2 mg Epi.   Past Medical History  EtOH use  CAD  Anxiety Tobacco use disorder HTN   Significant Hospital Events   6/1 CABG and L CEA  6/2 receiving BZD for possible EtOH withdrawals. Was on precedex  6/3 worsening respiratory status with worsening hypoxia. Received 40 mg lasix. PCCM consulted 6/5 intubated and bronch with removal of aspirated pill on bronchus intermedius 6/6 Self extubated  606-17-24Worsening hypoxia with respiratory distress and WOB. Reintubated 6/10 tracheostomy  6/12-6/14 - PS weaning trials  62024/06/25PEA arrest 6/18 PS trial on vent support   Consults:  Vascular surgery PCCM   Procedures:  6/1 CABG 6/1 L CEA  6/10 tracheostomy   Significant Diagnostic Tests:  Bronch 6/5: mucus plugs bilaterally, blue pill in BI   CXR 606/25/24s/p code with improved aeration compared to prior CT Chest 6Jun 25, 2024with partial left lung collapse, airspace disease c/w aspiration vs pna   Micro Data:  6/5 BAL-Candida tropicalis 6/9 blood culture - no growth 606-25-24blood culture-ng 606-25-24trach aspirate-  enterobacter  >> S to cefepime/Cipro/gent/meropenem 62024/06/25urine culture- ng  Antimicrobials:  Cefuroxime 6/1 (surg ppx)  vanc 6/1 Unasyn 6/4>> 6/12 Cefepime 6Jun 25, 2024>> Vancomycin 606-25-24>> off  Interim history/subjective:   Weaning on  pressure support. Very anxious , remains on fentanyl drip at 100 an hour, off Versed drip Communicates by writing Afebrile   Objective   Blood pressure (!) 150/95, pulse 94, temperature (!) 97.5 F (36.4 C), resp. rate (!) 23, height _0  (1.753 m), weight 60.9 kg, SpO2 96 %.    Vent Mode: PRVC FiO2 (%):  [40 %] 40 % Set Rate:  [24 bmp] 24 bmp Vt Set:  [570 mL] 570 mL PEEP:  [5 cmH20] 5 cmH20 Pressure Support:  [5 cmH20] 5 cmH20 Plateau Pressure:  [16 cmH20-25 cmH20] 17 cmH20   Intake/Output Summary (Last 24 hours) at 02/24/2020 0929 Last data filed at 02/24/2020 0700 Gross per 24 hour  Intake 3414.43 ml  Output 2290 ml  Net 1124.43 ml   Filed Weights   02/22/20 0400 02/23/20 0437 02/24/20 0600  Weight: 61.4 kg 61.1 kg 60.9 kg    Examination:  General: elderly male, cachetic, on vent  HENT: trach in place, no JVD PULM: BL vented breaths, diminished BL , no rhonchi, mild accessory muscle use CV: tachy, s1 s2,  GI: soft, nt nd  MSK: Significant muscle wasting Neuro: AAOx3 , follows commands communicates by writing Ext: brusing from vein harvest left leg   Labs: Normal electrolytes, no leukocytosis, stable anemia  CXR: 6/19 personally reviewed patchy opacity in the right stable, tiny left pneumothorax, bilateral chest tubes  Echo: normal LV function   Assessment & Plan:   Acute hypoxic and hypercarbic respiratory failure Aspiration-right lower lobe foreign body found during bronchoscopy, Candida  tropicalis on BAL culture. Hx tobacco use Severe bullous emphysema present on chest x-ray imaging Tracheostomy 6/10 Pneumothorax on left, tube in place S/p needle decompression during code on 6/15 CT Chest on 6/15 with LUL collapse b/l airspace dz, rib fractures   P: -Continue spontaneous breathing trials, goal would be trach collar but he has accessory muscle use on pressure support 5/5 so may hold off - ct management per TCTS   Enterobacter HAP -Continue cefepime,   tobramycin nebs added by T CTS, doubt this is necessary here  Cardic arrest- PEA - CPR x2 rounds x2 times - CT Chest with LUL collapse, b/l airspace disease, rib fractures   - CT head without acute change  - Echo with preserved EF  - DDx includes ACS, hypoxia, sepsis, drug related  P: - weaned off pressors at this time   Acute metabolic encephalopathy > improving - Had improved, thought to be caused by hypoxia, hypercarbia, possible medication related in setting of CIWA protocol meds and post-op pain meds - EtOH use disorder with possible EtOH withdrawals  - Acute worsening after code on 6/15, likely d/t to acute metabolic stress, hypoxia, sedating medications - CT head 6/15 negative for any acute change  Severe anxiety related to illness P: - fent gtt, goal RASS -1 -Increase diazepam to 5 twice daily and titrate to effect  CABG 6/1 L CEA 6/1 P: - ASA + plavix   H/o HTN Afib Thrombocytopenia > improved Cachexia, frailty - continue PT if recovers, not escalating care at the moment  Best practice:  Diet: Tube feeds Pain/Anxiety/Delirium protocol (if indicated): n/a  VAP protocol (if indicated): In place DVT prophylaxis: per primary, post op  GI prophylaxis: protonix  Glucose control: SSI  Mobility: BR Code Status: partial  Family Communication: Per T CTS Disposition: ICU     Kara Mead MD. FCCP. [facility]  If no response to pager , please call [phone]   02/24/2020

## 2020-02-25 ENCOUNTER — Inpatient Hospital Stay (HOSPITAL_COMMUNITY): Payer: Medicare HMO

## 2020-02-25 DIAGNOSIS — Y95 Nosocomial condition: Secondary | ICD-10-CM

## 2020-02-25 DIAGNOSIS — R042 Hemoptysis: Secondary | ICD-10-CM

## 2020-02-25 DIAGNOSIS — J189 Pneumonia, unspecified organism: Secondary | ICD-10-CM

## 2020-02-25 DIAGNOSIS — Z515 Encounter for palliative care: Secondary | ICD-10-CM

## 2020-02-25 DIAGNOSIS — Z7189 Other specified counseling: Secondary | ICD-10-CM

## 2020-02-25 LAB — BASIC METABOLIC PANEL
Anion gap: 7 (ref 5–15)
BUN: 30 mg/dL — ABNORMAL HIGH (ref 8–23)
CO2: 27 mmol/L (ref 22–32)
Calcium: 8.4 mg/dL — ABNORMAL LOW (ref 8.9–10.3)
Chloride: 108 mmol/L (ref 98–111)
Creatinine, Ser: 0.65 mg/dL (ref 0.61–1.24)
GFR calc Af Amer: >60 mL/min (ref >60)
GFR calc non Af Amer: >60 mL/min (ref >60)
Glucose, Bld: 129 mg/dL — ABNORMAL HIGH (ref 70–99)
Potassium: 3.5 mmol/L (ref 3.5–5.1)
Sodium: 142 mmol/L (ref 135–145)

## 2020-02-25 LAB — MAGNESIUM: Magnesium: 1.8 mg/dL (ref 1.7–2.4)

## 2020-02-25 LAB — CBC
HCT: 28.1 % — ABNORMAL LOW (ref 39.0–52.0)
Hemoglobin: 8.7 g/dL — ABNORMAL LOW (ref 13.0–17.0)
MCH: 29.2 pg (ref 26.0–34.0)
MCHC: 31 g/dL (ref 30.0–36.0)
MCV: 94.3 fL (ref 80.0–100.0)
Platelets: 175 10*3/uL (ref 150–400)
RBC: 2.98 MIL/uL — ABNORMAL LOW (ref 4.22–5.81)
RDW: 17.9 % — ABNORMAL HIGH (ref 11.5–15.5)
WBC: 4.8 10*3/uL (ref 4.0–10.5)
nRBC: 0.4 % — ABNORMAL HIGH (ref 0.0–0.2)

## 2020-02-25 LAB — GLUCOSE, CAPILLARY
Glucose-Capillary: 112 mg/dL — ABNORMAL HIGH (ref 70–99)
Glucose-Capillary: 117 mg/dL — ABNORMAL HIGH (ref 70–99)
Glucose-Capillary: 129 mg/dL — ABNORMAL HIGH (ref 70–99)
Glucose-Capillary: 140 mg/dL — ABNORMAL HIGH (ref 70–99)
Glucose-Capillary: 87 mg/dL (ref 70–99)
Glucose-Capillary: 97 mg/dL (ref 70–99)

## 2020-02-25 LAB — CULTURE, BLOOD (ROUTINE X 2)
Culture: NO GROWTH
Culture: NO GROWTH
Special Requests: ADEQUATE
Special Requests: ADEQUATE

## 2020-02-25 LAB — PHOSPHORUS: Phosphorus: 1.7 mg/dL — ABNORMAL LOW (ref 2.5–4.6)

## 2020-02-25 MED ORDER — POTASSIUM PHOSPHATES 15 MMOLE/5ML IV SOLN
30.0000 mmol | Freq: Once | INTRAVENOUS | Status: AC
  Administered 2020-02-25: 30 mmol via INTRAVENOUS
  Filled 2020-02-25: qty 10

## 2020-02-25 MED ORDER — POTASSIUM CHLORIDE 20 MEQ/15ML (10%) PO SOLN
20.0000 meq | ORAL | Status: DC
  Administered 2020-02-25: 20 meq
  Filled 2020-02-25: qty 15

## 2020-02-25 NOTE — Progress Notes (Signed)
19 Days Post-Op Procedure(s) (LRB): CORONARY ARTERY BYPASS GRAFTING (CABG),  x Four with Bilateral IMAs, and right leg greater saphenous vein (N/A) TRANSESOPHAGEAL ECHOCARDIOGRAM (TEE) (N/A) INDOCYANINE GREEN FLUORESCENCE IMAGING (ICG) (N/A) CAROTID ENDARTERECTOMY LEFT with  Patch Angioplasty Using Hemashield Platinum Finesse patch (Left) Subjective:  Complains of some pain. Wants to get off vent and go home. On trach collar this am.  Objective: Vital signs in last 24 hours: Temp:  [97.8 F (36.6 C)-98.4 F (36.9 C)] 98.1 F (36.7 C) (06/20 0400) Pulse Rate:  [54-107] 99 (06/20 1100) Cardiac Rhythm: Normal sinus rhythm (06/20 0800) Resp:  [16-31] 28 (06/20 1100) BP: (92-144)/(54-112) 135/77 (06/20 1100) SpO2:  [88 %-100 %] 100 % (06/20 1100) Arterial Line BP: (80-116)/(47-71) 115/71 (06/20 1100) FiO2 (%):  [30 %] 30 % (06/20 0325) Weight:  [61.3 kg] 61.3 kg (06/20 0500)  Hemodynamic parameters for last 24 hours:    Intake/Output from previous day: 06/19 0701 - 06/20 0700 In: 1333.9 [I.V.:817.8; NG/GT:315; IV Piggyback:201.1] Out: 2560 [Urine:1800; Stool:350; Chest Tube:410] Intake/Output this shift: Total I/O In: 499.5 [I.V.:100; NG/GT:195; IV Piggyback:204.5] Out: 130 [Chest Tube:130]  General appearance: alert, cooperative, tremulous Neurologic: intact Heart: regular rate and rhythm Lungs: coarse Extremities: no edema Wound: incisions healing well  Lab Results: Recent Labs    02/24/20 0359 02/25/20 0510  WBC 5.0 4.8  HGB 8.6* 8.7*  HCT 27.9* 28.1*  PLT 193 175   BMET:  Recent Labs    02/24/20 0359 02/25/20 0510  NA 145 142  K 4.0 3.5  CL 113* 108  CO2 25 27  GLUCOSE 127* 129*  BUN 34* 30*  CREATININE 0.71 0.65  CALCIUM 8.3* 8.4*    PT/INR: No results for input(s): LABPROT, INR in the last 72 hours. ABG    Component Value Date/Time   PHART 7.441 02/21/2020 0302   HCO3 24.9 02/21/2020 0302   TCO2 26 02/21/2020 0302   ACIDBASEDEF 3.0 (H)  02/20/2020 0434   O2SAT 100.0 02/21/2020 0302   CBG (last 3)  Recent Labs    02/25/20 0023 02/25/20 0445 02/25/20 0732  GLUCAP 117* 87 97   CXR: stable interstitial airspace densities. Good aeration, no significant effusions  Assessment/Plan: S/P Procedure(s) (LRB): CORONARY ARTERY BYPASS GRAFTING (CABG),  x Four with Bilateral IMAs, and right leg greater saphenous vein (N/A) TRANSESOPHAGEAL ECHOCARDIOGRAM (TEE) (N/A) INDOCYANINE GREEN FLUORESCENCE IMAGING (ICG) (N/A) CAROTID ENDARTERECTOMY LEFT with  Patch Angioplasty Using Hemashield Platinum Finesse patch (Left)  Hemodynamically stable off levophed on lower dose fentanyl drip.  Tolerating TC so far. He has a lot of thick secretions. He needs to stay on lower dose pain meds and avoid sedation.  Continues on Maxipime for pneumonia.  Tolerating tube feeds and healing incisions.  Still has pacing wires in. Discuss removal with ZA.   LOS: 19 days    Alleen Borne 02/25/2020
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
## 2020-02-25 NOTE — Progress Notes (Signed)
NAME:  Tony Munoz, MRN:  [id number], DOB:  1955/11/23, LOS: 41 ADMISSION DATE:  02/06/2020, CONSULTATION DATE:  02/08/20 REFERRING MD:  Orvan Seen - CVTS, CHIEF COMPLAINT:  SOB, hypoxia   Brief History   64 yo M with CAD, HTN, COPD admit for elective CABG and L CEA, with progressive respiratory failure with hypoxia and hypercarbia and intubated on POD 3. Self extubated 6/6 and required re-intubation 26-Feb-2023 Coded early morning of 2023/03/06, requiring CPR done for two rounds, 52m epinephrine given, 1 amp Bicarb. He was profoundly hypotensive at that point, so a push of epinephrine was given. Patient developed PEA a second time, achieving ROSC after 2 round of CPR, 2 mg Epi.   Past Medical History  EtOH use  CAD  Anxiety Tobacco use disorder HTN   Significant Hospital Events   6/1 CABG and L CEA  6/2 receiving BZD for possible EtOH withdrawals. Was on precedex  6/3 worsening respiratory status with worsening hypoxia. Received 40 mg lasix. PCCM consulted 6/5 intubated and bronch with removal of aspirated pill on bronchus intermedius 6/6 Self extubated  606-21-2024Worsening hypoxia with respiratory distress and WOB. Reintubated 6/10 tracheostomy  6/12-6/14 - PS weaning trials  62024/06/29PEA arrest 6/18 PS trial on vent support  6/19 weaned on pressure support 5/5 for 3 hours, very anxious  Consults:  Vascular surgery PCCM   Procedures:  6/1 CABG 6/1 L CEA  6/10 tracheostomy   Significant Diagnostic Tests:  Bronch 6/5: mucus plugs bilaterally, blue pill in BI   CXR 6June 29, 2024s/p code with improved aeration compared to prior CT Chest 62024-06-29with partial left lung collapse, airspace disease c/w aspiration vs pna   Micro Data:  6/5 BAL-Candida tropicalis 6/9 blood culture - no growth 6June 29, 2024blood culture-ng 606/29/2024trach aspirate-  enterobacter  >> S to cefepime/Cipro/gent/meropenem 62024-06-29urine culture- ng  Antimicrobials:  Cefuroxime 6/1 (surg ppx)  vanc 6/1 Unasyn 6/4>> 6/12 Cefepime 629-Jun-2024>> Vancomycin  6Jun 29, 2024>> off  Interim history/subjective:   Bloody secretions via tracheostomy overnight Afebrile Remains on low-dose Levophed drip and fentanyl Placed on isolation precautions  Objective   Blood pressure 111/76, pulse (!) 57, temperature 98.1 F (36.7 C), temperature source Oral, resp. rate (!) 24, height _0  (1.753 m), weight 61.3 kg, SpO2 96 %.    Vent Mode: PRVC FiO2 (%):  [30 %] 30 % Set Rate:  [24 bmp] 24 bmp Vt Set:  [560 mL-570 mL] 560 mL PEEP:  [5 cmH20] 5 cmH20 Plateau Pressure:  [13 cNOI37-04cmH20] 15 cmH20   Intake/Output Summary (Last 24 hours) at 02/25/2020 0759 Last data filed at 02/25/2020 0600 Gross per 24 hour  Intake 1268.93 ml  Output 2560 ml  Net -1291.07 ml   Filed Weights   02/23/20 0437 02/24/20 0600 02/25/20 0500  Weight: 61.1 kg 60.9 kg 61.3 kg    Examination:  General: elderly male, cachetic, on vent  HENT: trach in place, no JVD , bloody secretions PULM: BL vented breaths, diminished BL , no rhonchi, no accessory muscle use CV:  s1 s2 regular,  GI: soft, nt nd  MSK: Significant muscle wasting Neuro: AAOx3 , follows commands communicates by writing Ext: brusing from vein harvest left leg   Labs: Mild hypokalemia and hypophosphatemia, no leukocytosis, stable anemia  CXR: 6/19 personally reviewed patchy opacity in the right stable, tiny left pneumothorax, bilateral chest tubes    Assessment & Plan:   Acute hypoxic and hypercarbic respiratory failure Aspiration-right lower lobe foreign body found  during bronchoscopy, Candida tropicalis on BAL culture. Hx tobacco use Severe bullous emphysema present on chest x-ray imaging Tracheostomy 6/10 Pneumothorax on left, tube in place CT Chest on 6/15 with LUL collapse b/l airspace dz, rib fractures   P: -Continue spontaneous breathing trials, goal would be trial of trach collar daytime today -Chest tube management per TCTS   Bleeding from tracheostomy -Likely due to suction trauma -On  Plavix and subcu Lovenox which we may have to hold if worsens  Enterobacter HAP -Continue cefepime for 7 days -  tobramycin nebs added by T CTS, doubt this is necessary here -Does not need isolation  Cardic arrest- PEA - CPR x2 rounds x2 times - CT head without acute change  - Echo with preserved EF  - DDx includes ACS, hypoxia, sepsis, drug related  P: -On low-dose Levophed, consider midodrine if he stays on this -Hypokalemia and hypophosphatemia will be repleted   Acute metabolic encephalopathy > improving - Had improved, thought to be caused by hypoxia, hypercarbia, possible medication related in setting of CIWA protocol meds and post-op pain meds - EtOH use disorder - Acute worsening after code on 6/15, likely d/t to acute metabolic stress, hypoxia, sedating medications >> improved - CT head 6/15 negative for any acute change  Severe anxiety related to illness P: - fent gtt, goal RASS -1, change to intermittent once he starts doing trach collar -Increased diazepam to 5 twice daily 6/19 and titrate to effect  CABG 6/1 L CEA 6/1 P: - ASA + plavix   H/o HTN Afib Thrombocytopenia > improved Cachexia, frailty - continue PT , TFs  Overall hopeful to liberate him from the vent  Best practice:  Diet: Tube feeds Pain/Anxiety/Delirium protocol (if indicated): n/a  VAP protocol (if indicated): In place DVT prophylaxis: per primary, post op  GI prophylaxis: protonix  Glucose control: SSI  Mobility: BR Code Status: partial  Family Communication: Per T CTS Disposition: ICU     The patient is critically ill with multiple organ systems failure and requires high complexity decision making for assessment and support, frequent evaluation and titration of therapies, application of advanced monitoring technologies and extensive interpretation of multiple databases. Critical Care Time devoted to patient care services described in this note independent of APP/resident  time is 31  minutes.    Kara Mead MD. [facility]. [facility]  If no response to pager , please call [phone]   02/25/2020

## 2020-02-25 NOTE — Progress Notes (Signed)
Patient ID: Tony Munoz, male   DOB: Apr 12, 1956, 64 y.o.   MRN: [id number] TCTS Evening Rounds:  Hemodynamically stable today off vasopressors.  Fentanyl remains at 50  Has done well with 6 hrs on TC. A lot of secretions but nurse says he can clear them. Sat up in chair position in bed.  Urine output ok.

## 2020-02-25 NOTE — Progress Notes (Signed)
Wasted 26ml of Versed with [employer]

## 2020-02-25 NOTE — Consult Note (Signed)
Py aware just had breathing treatment, nephew at bedside. No needs at present. There was no request for follow-up

## 2020-02-26 ENCOUNTER — Ambulatory Visit: Payer: Self-pay | Admitting: Cardiothoracic Surgery

## 2020-02-26 ENCOUNTER — Inpatient Hospital Stay (HOSPITAL_COMMUNITY): Payer: Medicare HMO

## 2020-02-26 LAB — CBC
HCT: 27.2 % — ABNORMAL LOW (ref 39.0–52.0)
Hemoglobin: 8.5 g/dL — ABNORMAL LOW (ref 13.0–17.0)
MCH: 29.5 pg (ref 26.0–34.0)
MCHC: 31.3 g/dL (ref 30.0–36.0)
MCV: 94.4 fL (ref 80.0–100.0)
Platelets: 147 10*3/uL — ABNORMAL LOW (ref 150–400)
RBC: 2.88 MIL/uL — ABNORMAL LOW (ref 4.22–5.81)
RDW: 17.7 % — ABNORMAL HIGH (ref 11.5–15.5)
WBC: 5.1 10*3/uL (ref 4.0–10.5)
nRBC: 0 % (ref 0.0–0.2)

## 2020-02-26 LAB — POCT I-STAT 7, (LYTES, BLD GAS, ICA,H+H)
Acid-Base Excess: 7 mmol/L — ABNORMAL HIGH (ref 0.0–2.0)
Bicarbonate: 29.9 mmol/L — ABNORMAL HIGH (ref 20.0–28.0)
Calcium, Ion: 1.24 mmol/L (ref 1.15–1.40)
HCT: 25 % — ABNORMAL LOW (ref 39.0–52.0)
Hemoglobin: 8.5 g/dL — ABNORMAL LOW (ref 13.0–17.0)
O2 Saturation: 98 %
Patient temperature: 98.5
Potassium: 3.7 mmol/L (ref 3.5–5.1)
Sodium: 140 mmol/L (ref 135–145)
TCO2: 31 mmol/L (ref 22–32)
pCO2 arterial: 35.1 mmHg (ref 32.0–48.0)
pH, Arterial: 7.538 — ABNORMAL HIGH (ref 7.350–7.450)
pO2, Arterial: 87 mmHg (ref 83.0–108.0)

## 2020-02-26 LAB — GLUCOSE, CAPILLARY
Glucose-Capillary: 109 mg/dL — ABNORMAL HIGH (ref 70–99)
Glucose-Capillary: 116 mg/dL — ABNORMAL HIGH (ref 70–99)
Glucose-Capillary: 118 mg/dL — ABNORMAL HIGH (ref 70–99)
Glucose-Capillary: 126 mg/dL — ABNORMAL HIGH (ref 70–99)
Glucose-Capillary: 128 mg/dL — ABNORMAL HIGH (ref 70–99)
Glucose-Capillary: 131 mg/dL — ABNORMAL HIGH (ref 70–99)

## 2020-02-26 LAB — BASIC METABOLIC PANEL
Anion gap: 8 (ref 5–15)
BUN: 28 mg/dL — ABNORMAL HIGH (ref 8–23)
CO2: 26 mmol/L (ref 22–32)
Calcium: 7.9 mg/dL — ABNORMAL LOW (ref 8.9–10.3)
Chloride: 104 mmol/L (ref 98–111)
Creatinine, Ser: 0.63 mg/dL (ref 0.61–1.24)
GFR calc Af Amer: >60 mL/min (ref >60)
GFR calc non Af Amer: >60 mL/min (ref >60)
Glucose, Bld: 122 mg/dL — ABNORMAL HIGH (ref 70–99)
Potassium: 3.6 mmol/L (ref 3.5–5.1)
Sodium: 138 mmol/L (ref 135–145)

## 2020-02-26 LAB — PHOSPHORUS: Phosphorus: 2.6 mg/dL (ref 2.5–4.6)

## 2020-02-26 LAB — MAGNESIUM: Magnesium: 1.7 mg/dL (ref 1.7–2.4)

## 2020-02-26 MED ORDER — CITALOPRAM HYDROBROMIDE 20 MG PO TABS
20.0000 mg | ORAL_TABLET | Freq: Every day | ORAL | Status: DC
  Administered 2020-02-26 – 2020-03-02 (×6): 20 mg via ORAL
  Filled 2020-02-26 (×6): qty 1

## 2020-02-26 MED ORDER — MAGNESIUM SULFATE 2 GM/50ML IV SOLN
2.0000 g | Freq: Once | INTRAVENOUS | Status: AC
  Administered 2020-02-26: 2 g via INTRAVENOUS
  Filled 2020-02-26: qty 50

## 2020-02-26 MED ORDER — DIAZEPAM 5 MG PO TABS
10.0000 mg | ORAL_TABLET | Freq: Two times a day (BID) | ORAL | Status: DC
  Administered 2020-02-26 – 2020-03-01 (×10): 10 mg
  Filled 2020-02-26 (×10): qty 2

## 2020-02-26 MED ORDER — MIDODRINE HCL 5 MG PO TABS
5.0000 mg | ORAL_TABLET | Freq: Three times a day (TID) | ORAL | Status: DC
  Administered 2020-02-26 (×3): 5 mg via ORAL
  Filled 2020-02-26 (×3): qty 1

## 2020-02-26 MED ORDER — OXYCODONE HCL 5 MG PO TABS
5.0000 mg | ORAL_TABLET | ORAL | Status: DC
  Administered 2020-02-26 – 2020-02-27 (×7): 5 mg via ORAL
  Filled 2020-02-26 (×7): qty 1

## 2020-02-26 MED ORDER — ALBUMIN HUMAN 25 % IV SOLN
25.0000 g | Freq: Four times a day (QID) | INTRAVENOUS | Status: AC
  Administered 2020-02-26 – 2020-02-27 (×4): 25 g via INTRAVENOUS
  Filled 2020-02-26 (×4): qty 100

## 2020-02-26 MED ORDER — POTASSIUM CHLORIDE 20 MEQ/15ML (10%) PO SOLN
20.0000 meq | ORAL | Status: AC
  Administered 2020-02-26 (×3): 20 meq
  Filled 2020-02-26 (×3): qty 15

## 2020-02-26 NOTE — Progress Notes (Signed)
Patient ID: Tony Munoz, male   DOB: 10-18-1955, 64 y.o.   MRN: [id number] EVENING ROUNDS NOTE :     [address]       [place] [address]             [phone]                 20 Days Post-Op Procedure(s) (LRB): CORONARY ARTERY BYPASS GRAFTING (CABG),  x Four with Bilateral IMAs, and right leg greater saphenous vein (N/A) TRANSESOPHAGEAL ECHOCARDIOGRAM (TEE) (N/A) INDOCYANINE GREEN FLUORESCENCE IMAGING (ICG) (N/A) CAROTID ENDARTERECTOMY LEFT with  Patch Angioplasty Using Hemashield Platinum Finesse patch (Left)  Total Length of Stay:  LOS: 20 days  BP (!) 125/97   Pulse 98   Temp 98.2 F (36.8 C) (Oral)   Resp (!) 24   Ht 5\' 9"  (1.753 m)   Wt 61.1 kg   SpO2 97%   BMI 19.89 kg/m   .Intake/Output      06/20 0701 - 06/21 0700 06/21 0701 - 06/22 0700   I.V. (mL/kg) 210.4 (3.4) 33.3 (0.5)   Other  0   NG/GT 1430 585   IV Piggyback 907.9 150.1   Total Intake(mL/kg) 2548.4 (41.7) 768.4 (12.6)   Urine (mL/kg/hr) 2055 (1.4) 1200 (1.8)   Emesis/NG output  0   Stool 340 200   Chest Tube 670 220   Total Output 3065 1620   Net -516.6 -851.6        Stool Occurrence 1 x      . sodium chloride Stopped (02/25/20 0946)  . albumin human 25 g (02/26/20 1731)  . ceFEPime (MAXIPIME) IV Stopped (02/26/20 1344)  . feeding supplement (VITAL AF 1.2 CAL) 65 mL/hr at 02/26/20 0600  . norepinephrine (LEVOPHED) Adult infusion 2 mcg/min (02/25/20 2339)     Lab Results  Component Value Date   WBC 5.1 02/26/2020   HGB 8.5 (L) 02/26/2020   HCT 27.2 (L) 02/26/2020   PLT 147 (L) 02/26/2020   GLUCOSE 122 (H) 02/26/2020   ALT 89 (H) 02/20/2020   AST 49 (H) 02/20/2020   NA 138 02/26/2020   K 3.6 02/26/2020   CL 104 02/26/2020   CREATININE 0.63 02/26/2020   BUN 28 (H) 02/26/2020   CO2 26 02/26/2020   INR 1.8 (H) 02/06/2020   HGBA1C 5.2 02/02/2020   Patient writing notes On and off vent today     02/04/2020 MD  Beeper [phone] Office [phone]  02/26/2020 6:09 PM

## 2020-02-26 NOTE — Progress Notes (Signed)
Pharmacy Antibiotic Note  Tony Munoz is a 64 y.o. male s/p PEA arrest this morning  Pharmacy has been consulted for cefepime dosing for enterobacter PNA (he is also on tobramycin nebs). Today is day 7 of antibioticx -WBC= 5, afebile   Tobra nebs 6/16>>  Cefepime 6/15>> Vanc 6/15>> 6/17 Unasyn 6/4 > 6/12 Cefuroxime 6/1>6/3 Vanc 6/1>6/1  6/5 BAL - 80k candida tropicalis 6/9 bldx2 - neg 6/15 resp-  enterobacter cloacae (sens cefepime, resis to CTX) 6/15 urine- neg 6/15 MRSA PCR- neg 6/15 bloodx 2- neg  Plan: Continue cefepime to 2gm IV q8 hours. Consider discontinuing antibiotics? F/u renal function, cultures and clinical course  Height: _0  (175.3 cm) Weight: 61.1 kg (134 lb 11.2 oz) IBW/kg (Calculated) : 70.7  Temp (24hrs), Avg:98 F (36.7 C), Min:97.5 F (36.4 C), Max:98.4 F (36.9 C)  Recent Labs  Lab 02/22/20 0331 02/23/20 0552 02/24/20 0359 02/25/20 0510 02/26/20 0320  WBC 5.6 4.2 5.0 4.8 5.1  CREATININE 0.89 0.78 0.71 0.65 0.63    Estimated Creatinine Clearance: 79.6 mL/min (by C-G formula based on SCr of 0.63 mg/dL).    No Known Allergies   Thank you for allowing pharmacy to be a part of this patient's care.  Hildred Laser, PharmD Clinical Pharmacist **Pharmacist phone directory can now be found on [email or web address] ([email or web address]).  Listed under [place].

## 2020-02-26 NOTE — Progress Notes (Signed)
Physical Therapy Treatment Patient Details Name: Tony Munoz MRN: [id number] DOB: 04/14/1956 Today's Date: 02/26/2020    History of Present Illness 64 y.o male presented for surgery 02/06/20 for CABGx4 , bilateral IMA,Left carotid endartectomy. Complications from ETOH, worsening respiratory function requiring bipap. Extubated 06/02, off bipap 06/04. 6/5 pt re-intubated d/t bil mucus plugs, bronch with removal of aspirated pill on bronchus intermedius, pt self-extubated 6/6, re-intubated 6/7 due to worsening hypoxia with respiratory distress and WOB, trach 6/10, PEA arrest 6/15. PMHx includes radiculopathy, HTN, dyspnea, smoker, carotid artery occulsion, anxiety.    PT Comments    Pt disconnected from trach collar tubing on arrival with sats 85%, returned pt to O2 with rise to 96% on 10L with FiO2 40%. Pt with secretions from trach and RN suctioning prior to mobility. Pt with ability to tolerate tilt to 37 degrees today and performed increased bil LE HEP. Pt educated for progression and continued limitation due to right femoral art line. Pt limited by fatigue with nephew present in room end of session.   BP supine 115/78 (93) in tilt HR 74   Follow Up Recommendations  SNF     Equipment Recommendations  Hospital bed    Recommendations for Other Services OT consult     Precautions / Restrictions Precautions Precautions: Sternal;Fall Precaution Comments: cortrak, flexiseal, chest tube, rt femoral arterial line, trach Restrictions Other Position/Activity Restrictions: sternal precautions, external pacemaker     Mobility  Bed Mobility Overal bed mobility: Needs Assistance Bed Mobility: Rolling Rolling: Min assist   Supine to sit: +2 for physical assistance;Max assist Sit to supine: +2 for physical assistance;Max assist   General bed mobility comments: cues for sequence with pt able to roll to right and max assist to slide toward [facility] with pt assiting pushing with  LLE  Transfers Overall transfer level: Needs assistance               General transfer comment: kreg tilt bed to achieve 37 degrees for 4 min. pt declined increased tilt or increased time in tilt. Able to maintain 37 degrees without strapping and performed minisquats in tilt  Ambulation/Gait             General Gait Details: unable at this time   Stairs             Wheelchair Mobility    Modified Rankin (Stroke Patients Only)       Balance Overall balance assessment: Needs assistance Sitting-balance support: No upper extremity supported Sitting balance-Leahy Scale: Poor Sitting balance - Comments: pt declined transfer to EOB Postural control: Left lateral lean Standing balance support:  (in tilt bed) Standing balance-Leahy Scale: Zero Standing balance comment: dependent on tilt bed with support of straps                            Cognition Arousal/Alertness: Awake/alert Behavior During Therapy: WFL for tasks assessed/performed Overall Cognitive Status: Difficult to assess Area of Impairment: Following commands;Memory;Safety/judgement                 Orientation Level: Time Current Attention Level: Sustained Memory: Decreased recall of precautions;Decreased short-term memory Following Commands: Follows one step commands inconsistently;Follows one step commands with increased time Safety/Judgement: Decreased awareness of safety;Decreased awareness of deficits     General Comments: pt mouthing and stating some words, remains somewhat perseverative on having yonker in hand, does not recall tilting last session      Exercises  Long Arc Quad: AROM;Both;10 reps;Supine (supine with HOB elevated and foot dropped) Hip ABduction/ADduction: AROM;Both;Seated;10 reps Mini-Sqauts: AROM;Other (comment);10 reps;Both (2 sets in 37 degree tilt)     General Comments        Pertinent Vitals/Pain Pain Assessment: No/denies pain Faces Pain  Scale: Hurts little more Pain Location: generalized  Pain Descriptors / Indicators: Discomfort;Grimacing    Home Living                      Prior Function            PT Goals (current goals can now be found in the care plan section) Acute Rehab PT Goals Patient Stated Goal: none stated, agreeable to working with therapies PT Goal Formulation: With patient Time For Goal Achievement: 03/07/20 Potential to Achieve Goals: Good Progress towards PT goals: Progressing toward goals    Frequency    Min 2X/week      PT Plan Current plan remains appropriate    Co-evaluation              AM-PAC PT "6 Clicks" Mobility   Outcome Measure  Help needed turning from your back to your side while in a flat bed without using bedrails?: A Lot Help needed moving from lying on your back to sitting on the side of a flat bed without using bedrails?: Total Help needed moving to and from a bed to a chair (including a wheelchair)?: Total Help needed standing up from a chair using your arms (e.g., wheelchair or bedside chair)?: Total Help needed to walk in hospital room?: Total Help needed climbing 3-5 steps with a railing? : Total 6 Click Score: 7    End of Session Equipment Utilized During Treatment: Oxygen Activity Tolerance: Patient tolerated treatment well Patient left: in bed;with call bell/phone within reach;with family/visitor present Nurse Communication: Mobility status PT Visit Diagnosis: Other abnormalities of gait and mobility (R26.89);Difficulty in walking, not elsewhere classified (R26.2) Pain - part of body:  (chest)     Time: 7169-6789 PT Time Calculation (min) (ACUTE ONLY): 27 min  Charges:  $Therapeutic Exercise: 8-22 mins $Therapeutic Activity: 8-22 mins                     Kalika Smay P, PT Acute Rehabilitation Services Pager: [phone] Office: [phone]    Alexxus Sobh B Betta Balla 02/26/2020, 1:20 PM

## 2020-02-26 NOTE — Progress Notes (Signed)
Physical Therapy Wound Treatment Patient Details  Name: Tony Munoz MRN: [id number] Date of Birth: 1956-08-22  Today's Date: 02/26/2020 Time: 0254-2706 Time Calculation (min): 40 min  Subjective  Subjective: Pt agreeable to hydrotherapy; writes he slept better last night Patient and Family Stated Goals: None stated Date of Onset:  (unknown) Prior Treatments: dressing change  Pain Score:  Painful - unable to tolerate debridement  Wound Assessment  Pressure Injury 02/13/20 Sacrum Unstageable - Full thickness tissue loss in which the base of the injury is covered by slough (yellow, tan, gray, green or brown) and/or eschar (tan, brown or black) in the wound bed. (Active)  Dressing Type ABD;Barrier Film (skin prep);Gauze (Comment);Moist to dry 02/26/20 1106  Dressing Changed;Clean;Dry;Intact 02/26/20 1106  Dressing Change Frequency Daily 02/26/20 1106  State of Healing Non-healing 02/26/20 1106  Site / Wound Assessment Pink;Yellow 02/26/20 1106  % Wound base Red or Granulating 40% 02/26/20 1106  % Wound base Yellow/Fibrinous Exudate 60% 02/26/20 1106  % Wound base Black/Eschar 0% 02/26/20 1106  % Wound base Other/Granulation Tissue (Comment) 0% 02/26/20 1106  Peri-wound Assessment Erythema (blanchable) 02/26/20 1106  Wound Length (cm) 2 cm 02/23/20 0949  Wound Width (cm) 2 cm 02/23/20 0949  Wound Depth (cm) 0.1 cm 02/23/20 0949  Wound Surface Area (cm^2) 4 cm^2 02/23/20 0949  Wound Volume (cm^3) 0.4 cm^3 02/23/20 0949  Margins Unattached edges (unapproximated) 02/26/20 1106  Drainage Amount Scant 02/26/20 1106  Drainage Description Serosanguineous 02/26/20 1106  Treatment Debridement (Selective);Hydrotherapy (Pulse lavage);Packing (Saline gauze) 02/26/20 1106   Santyl applied to wound bed prior to applying dressing.     Hydrotherapy Pulsed lavage therapy - wound location: coccyx Pulsed Lavage with Suction (psi): 12 psi Pulsed Lavage with Suction - Normal Saline Used: 1000  mL Pulsed Lavage Tip: Tip with splash shield Selective Debridement Selective Debridement - Location: coccyx Selective Debridement - Tools Used: Scalpel;Forceps Selective Debridement - Tissue Removed: yellow slough   Wound Assessment and Plan  Wound Therapy - Assess/Plan/Recommendations Wound Therapy - Clinical Statement: Minimal tissue able to be debrided this session due to pain. Cross-hatched the remaining area. Necrotic tissue appears to be softening based on prior hydro notes.  Wound Therapy - Functional Problem List: decreased mobility, malnutrition Factors Delaying/Impairing Wound Healing: Multiple medical problems;Immobility Hydrotherapy Plan: Debridement;Patient/family education;Pulsatile lavage with suction Wound Therapy - Frequency: 6X / week Wound Therapy - Follow Up Recommendations: Skilled nursing facility Wound Plan:  (see above)  Wound Therapy Goals- Improve the function of patient's integumentary system by progressing the wound(s) through the phases of wound healing (inflammation - proliferation - remodeling) by: Decrease Necrotic Tissue to: 50 Decrease Necrotic Tissue - Progress: Progressing toward goal Increase Granulation Tissue to: 50 Increase Granulation Tissue - Progress: Progressing toward goal Goals/treatment plan/discharge plan were made with and agreed upon by patient/family: Yes Time For Goal Achievement: 7 days Wound Therapy - Potential for Goals: Fair  Goals will be updated until maximal potential achieved or discharge criteria met.  Discharge criteria: when goals achieved, discharge from hospital, MD decision/surgical intervention, no progress towards goals, refusal/missing three consecutive treatments without notification or medical reason.  GP     Thelma Comp 02/26/2020, 11:16 AM   Rolinda Roan, PT, DPT Acute Rehabilitation Services Pager: [phone] Office: [phone]

## 2020-02-26 NOTE — Progress Notes (Signed)
NAME:  Tony Munoz, MRN:  [id number], DOB:  09-22-1955, LOS: 69 ADMISSION DATE:  02/06/2020, CONSULTATION DATE:  02/08/20 REFERRING MD:  Orvan Seen - CVTS, CHIEF COMPLAINT:  SOB, hypoxia   Brief History   64 yo M with CAD, HTN, COPD admit for elective CABG and L CEA, with progressive respiratory failure with hypoxia and hypercarbia and intubated on POD 3. Self extubated 6/6 and required re-intubation 02/18/23 Coded early morning of 02/26/23, requiring CPR done for two rounds, 23m epinephrine given, 1 amp Bicarb. He was profoundly hypotensive at that point, so a push of epinephrine was given. Patient developed PEA a second time, achieving ROSC after 2 round of CPR, 2 mg Epi.   Past Medical History  EtOH use  CAD  Anxiety Tobacco use disorder HTN   Significant Hospital Events   6/1 CABG and L CEA  6/2 receiving BZD for possible EtOH withdrawals. Was on precedex  6/3 worsening respiratory status with worsening hypoxia. Received 40 mg lasix. PCCM consulted 6/5 intubated and bronch with removal of aspirated pill on bronchus intermedius 6/6 Self extubated  606/13/24Worsening hypoxia with respiratory distress and WOB. Reintubated 6/10 tracheostomy  6/12-6/14 - PS weaning trials  606/21/24PEA arrest 6/18 PS trial on vent support  6/19 weaned on pressure support 5/5 for 3 hours, very anxious  Consults:  Vascular surgery PCCM   Procedures:  6/1 CABG 6/1 L CEA  6/10 tracheostomy   Significant Diagnostic Tests:  Bronch 6/5: mucus plugs bilaterally, blue pill in BI   CXR 6June 21, 2024s/p code with improved aeration compared to prior CT Chest 606-21-2024with partial left lung collapse, airspace disease c/w aspiration vs pna   Micro Data:  6/5 BAL-Candida tropicalis 6/9 blood culture - no growth 606/21/24blood culture-ng 606-21-24trach aspirate-  enterobacter  >> S to cefepime/Cipro/gent/meropenem 606-21-2024urine culture- ng  Antimicrobials:  Cefuroxime 6/1 (surg ppx)  vanc 6/1 Unasyn 6/4>> 6/12 Cefepime 62024/06/21>> Vancomycin  606-21-2024>> off  Interim history/subjective:  No events. Anxious this AM. Tolerated TC x 6 hours yesterday. High secretion burden oral and bronchial persists  Objective   Blood pressure 117/72, pulse 88, temperature 98.3 F (36.8 C), temperature source Oral, resp. rate (!) 26, height _0  (1.753 m), weight 61.1 kg, SpO2 100 %.    Vent Mode: PRVC FiO2 (%):  [30 %-40 %] 30 % Set Rate:  [24 bmp] 24 bmp Vt Set:  [560 mL] 560 mL PEEP:  [5 cmH20] 5 cmH20 Plateau Pressure:  [16 cmH20-19 cmH20] 18 cmH20   Intake/Output Summary (Last 24 hours) at 02/26/2020 0750 Last data filed at 02/26/2020 0700 Gross per 24 hour  Intake 2548.37 ml  Output 3065 ml  Net -516.63 ml   Filed Weights   02/24/20 0600 02/25/20 0500 02/26/20 0350  Weight: 60.9 kg 61.3 kg 61.1 kg    Examination: GEN: frail elderly man in bed HEENT: trach in place, moderate mostly yellow secretions CV: RRR, +SEM, ext warm PULM: Rhonci and wheezing on both sides, +accessory muscle use intermittent GI: Soft, NGT in place EXT: Muscle wasting NEURO: moves all 4 ext, weak PSYCH: Anxious and in pain SKIN: sternotomy scar and central line scars healing, right chest tubes in place  CXR: emphysema, L>R airspace disease, stable chest tubes Labs look good, K/Mg repleting  Assessment & Plan:   Acute hypoxic and hypercarbic respiratory failure Aspiration-right lower lobe foreign body found during bronchoscopy, Candida tropicalis on BAL culture. Hx tobacco use Severe bullous emphysema present on  chest x-ray imaging Tracheostomy 6/10 Pneumothorax on left, tube in place CT Chest on 6/15 with LUL collapse b/l airspace dz, rib fractures  -Continue PS trials -Chest tube management per TCTS   Bleeding from tracheostomy -Minimal today  Enterobacter HAP -Continue cefepime and tobramycin per TCTS, I think 7 days is reasonable -Does not need isolation, will dc  Persistent shock- no ongoing sepsis, echo looks fine, seems to just be  vasoplegia from prolonged hospital stay -Albumin, midodrine  Acute metabolic encephalopathy > improving Hx EtOH abuse Severe anxiety related to illness - Increase valium, start oxycodone standing, wean off fentanyl gtt - Start celexa  CABG 6/1 L CEA 6/1 - ASA + plavix   H/o HTN Afib Thrombocytopenia > improved Cachexia, frailty - continue PT , TFs - Palliative following  Best practice:  Diet: Tube feeds Pain/Anxiety/Delirium protocol (if indicated): see above VAP protocol (if indicated): In place DVT prophylaxis: lovenox GI prophylaxis: protonix  Glucose control: SSI  Mobility: BR Code Status: partial  Family Communication: Per TCTS Disposition: ICU     The patient is critically ill with multiple organ systems failure and requires high complexity decision making for assessment and support, frequent evaluation and titration of therapies, application of advanced monitoring technologies and extensive interpretation of multiple databases. Critical Care Time devoted to patient care services described in this note independent of APP/resident time (if applicable)  is 34 minutes.   Erskine Emery MD [facility] 02/26/2020 8:02 AM Personal pager: [phone] If unanswered, please page CCM On-call: [phone]

## 2020-02-26 NOTE — Progress Notes (Signed)
With the help of two volunteers Chaplain was able to notarize AD/HCPOA for patient.  Vernell Morgans Chaplain Resident

## 2020-02-26 NOTE — Consult Note (Signed)
WOC Nurse Consult Note: Patient receiving care in [facility] 2H05.  Assisted with positioning by NT. Reason for Consult: Re-eval of coccyx wound Wound type: unstageable Pressure Injury POA: No Measurement: 4 cm x 4 cm Wound bed: Drainage (amount, consistency, odor)  Periwound: Dressing procedure/placement/frequency: continue current therapy of PT hydrotherapy and Santyl. Helmut Muster, RN, MSN, CWOCN, CNS-BC, pager [phone]

## 2020-02-27 ENCOUNTER — Ambulatory Visit: Payer: Medicare HMO | Admitting: Cardiology

## 2020-02-27 ENCOUNTER — Inpatient Hospital Stay (HOSPITAL_COMMUNITY): Payer: Medicare HMO

## 2020-02-27 LAB — BASIC METABOLIC PANEL
Anion gap: 7 (ref 5–15)
BUN: 28 mg/dL — ABNORMAL HIGH (ref 8–23)
CO2: 27 mmol/L (ref 22–32)
Calcium: 8.2 mg/dL — ABNORMAL LOW (ref 8.9–10.3)
Chloride: 102 mmol/L (ref 98–111)
Creatinine, Ser: 0.73 mg/dL (ref 0.61–1.24)
GFR calc Af Amer: >60 mL/min (ref >60)
GFR calc non Af Amer: >60 mL/min (ref >60)
Glucose, Bld: 100 mg/dL — ABNORMAL HIGH (ref 70–99)
Potassium: 4.2 mmol/L (ref 3.5–5.1)
Sodium: 136 mmol/L (ref 135–145)

## 2020-02-27 LAB — CBC
HCT: 25 % — ABNORMAL LOW (ref 39.0–52.0)
Hemoglobin: 7.8 g/dL — ABNORMAL LOW (ref 13.0–17.0)
MCH: 29.4 pg (ref 26.0–34.0)
MCHC: 31.2 g/dL (ref 30.0–36.0)
MCV: 94.3 fL (ref 80.0–100.0)
Platelets: 102 10*3/uL — ABNORMAL LOW (ref 150–400)
RBC: 2.65 MIL/uL — ABNORMAL LOW (ref 4.22–5.81)
RDW: 18.2 % — ABNORMAL HIGH (ref 11.5–15.5)
WBC: 3.1 10*3/uL — ABNORMAL LOW (ref 4.0–10.5)
nRBC: 0 % (ref 0.0–0.2)

## 2020-02-27 LAB — GLUCOSE, CAPILLARY
Glucose-Capillary: 106 mg/dL — ABNORMAL HIGH (ref 70–99)
Glucose-Capillary: 117 mg/dL — ABNORMAL HIGH (ref 70–99)
Glucose-Capillary: 120 mg/dL — ABNORMAL HIGH (ref 70–99)
Glucose-Capillary: 128 mg/dL — ABNORMAL HIGH (ref 70–99)
Glucose-Capillary: 135 mg/dL — ABNORMAL HIGH (ref 70–99)
Glucose-Capillary: 137 mg/dL — ABNORMAL HIGH (ref 70–99)

## 2020-02-27 LAB — MAGNESIUM: Magnesium: 2.4 mg/dL (ref 1.7–2.4)

## 2020-02-27 MED ORDER — THIAMINE HCL 100 MG/ML IJ SOLN
100.0000 mg | Freq: Every day | INTRAMUSCULAR | Status: DC
  Administered 2020-02-28 – 2020-03-02 (×4): 100 mg via INTRAVENOUS
  Filled 2020-02-27 (×4): qty 2

## 2020-02-27 MED ORDER — OXYCODONE HCL 5 MG/5ML PO SOLN
5.0000 mg | ORAL | Status: DC
  Administered 2020-02-27 – 2020-03-04 (×36): 5 mg
  Filled 2020-02-27 (×36): qty 5

## 2020-02-27 MED ORDER — MIDODRINE HCL 5 MG PO TABS
10.0000 mg | ORAL_TABLET | Freq: Three times a day (TID) | ORAL | Status: DC
  Administered 2020-02-27 – 2020-02-29 (×9): 10 mg via ORAL
  Filled 2020-02-27 (×10): qty 2

## 2020-02-27 NOTE — Progress Notes (Signed)
Physical Therapy Wound Treatment Patient Details  Name: Tony Munoz MRN: [id number] Date of Birth: May 01, 1956  Today's Date: 02/27/2020 Time: 1103-1594 Time Calculation (min): 28 min  Subjective  Subjective: Pt agreeable to hydrotherapy; writes he slept better last night Patient and Family Stated Goals: None stated Date of Onset:  (unknown) Prior Treatments: dressing change  Pain Score:  10/10   Wound Assessment  Pressure Injury 02/13/20 Sacrum Unstageable - Full thickness tissue loss in which the base of the injury is covered by slough (yellow, tan, gray, green or brown) and/or eschar (tan, brown or black) in the wound bed. (Active)  Wound Image   02/22/20 1003  Dressing Type ABD;Barrier Film (skin prep);Moist to moist;Gauze (Comment) 02/27/20 1700  Dressing Changed;Clean;Dry;Intact 02/27/20 1700  Dressing Change Frequency Daily 02/27/20 1700  State of Healing Non-healing 02/27/20 1700  Site / Wound Assessment Pink;Yellow 02/27/20 1700  % Wound base Red or Granulating 40% 02/27/20 1700  % Wound base Yellow/Fibrinous Exudate 60% 02/27/20 1700  % Wound base Black/Eschar 0% 02/27/20 1700  % Wound base Other/Granulation Tissue (Comment) 0% 02/27/20 1700  Peri-wound Assessment Erythema (blanchable) 02/27/20 1700  Wound Length (cm) 2 cm 02/23/20 0949  Wound Width (cm) 2 cm 02/23/20 0949  Wound Depth (cm) 0.1 cm 02/23/20 0949  Wound Surface Area (cm^2) 4 cm^2 02/23/20 0949  Wound Volume (cm^3) 0.4 cm^3 02/23/20 0949  Margins Unattached edges (unapproximated) 02/27/20 1700  Drainage Amount Scant 02/27/20 1700  Drainage Description Serosanguineous 02/27/20 1700  Treatment Hydrotherapy (Pulse lavage);Packing (Saline gauze) 02/27/20 1700   Santyl applied to yellow slough material    Hydrotherapy Pulsed lavage therapy - wound location: coccyx Pulsed Lavage with Suction (psi): 4 psi Pulsed Lavage with Suction - Normal Saline Used: 500 mL Pulsed Lavage Tip: Tip with splash  shield Selective Debridement Selective Debridement - Location: unable today due to pain    Wound Assessment and Plan  Wound Therapy - Assess/Plan/Recommendations Wound Therapy - Clinical Statement:   Pt with increased secretions in airway today decreasing ability to have head of bed flattened or lie on his side.Pt requires 2x breaks in pulse lavage due to pain, decreased suction but still too painful. Pt only able to tolerate about 523m of saline lavage. Did not attempt debridement due to pain. Discussed with pt that given his increased pain and decreased tolerance of hydrotherapy it is possible to take a less agressive approach of enzymatic debridement and dressing change. Pt and therapist in mutual agreement to d/c from Hydrotherapy service. Pt assured that if when he is breathing better he wants to continue that hydrotherapy could be reordered.  Wound Therapy - Functional Problem List: decreased mobility, malnutrition Factors Delaying/Impairing Wound Healing: Multiple medical problems;Immobility Hydrotherapy Plan: Debridement;Patient/family education;Pulsatile lavage with suction Wound Therapy - Frequency: 6X / week Wound Therapy - Follow Up Recommendations: Skilled nursing facility Wound Plan:  (see above)  Wound Therapy Goals- Improve the function of patient's integumentary system by progressing the wound(s) through the phases of wound healing (inflammation - proliferation - remodeling) by: Decrease Necrotic Tissue to: 50 Decrease Necrotic Tissue - Progress: Progressing toward goal Increase Granulation Tissue to: 50 Increase Granulation Tissue - Progress: Progressing toward goal Goals/treatment plan/discharge plan were made with and agreed upon by patient/family: Yes Time For Goal Achievement: 7 days Wound Therapy - Potential for Goals: Fair  Goals will be updated until maximal potential achieved or discharge criteria met.  Discharge criteria: when goals achieved, discharge from  hospital, MD decision/surgical intervention, no progress towards  goals, refusal/missing three consecutive treatments without notification or medical reason.  GP    Dani Gobble. Migdalia Dk PT, DPT Acute Rehabilitation Services Pager [phone] Office [phone]  [place] 02/27/2020, 5:15 PM

## 2020-02-27 NOTE — Progress Notes (Signed)
NAME:  Tony Munoz, MRN:  [id number], DOB:  Aug 23, 1956, LOS: 43 ADMISSION DATE:  02/06/2020, CONSULTATION DATE:  02/08/20 REFERRING MD:  Orvan Seen - CVTS, CHIEF COMPLAINT:  SOB, hypoxia   Brief History   64 yo M with CAD, HTN, COPD admit for elective CABG and L CEA, with progressive respiratory failure with hypoxia and hypercarbia and intubated on POD 3. Self extubated 6/6 and required re-intubation Mar 06, 2023 Coded early morning of 2023/03/14, requiring CPR done for two rounds, 60m epinephrine given, 1 amp Bicarb. He was profoundly hypotensive at that point, so a push of epinephrine was given. Patient developed PEA a second time, achieving ROSC after 2 round of CPR, 2 mg Epi.   Past Medical History  EtOH use  CAD  Anxiety Tobacco use disorder HTN   Significant Hospital Events   6/1 CABG and L CEA  6/2 receiving BZD for possible EtOH withdrawals. Was on precedex  6/3 worsening respiratory status with worsening hypoxia. Received 40 mg lasix. PCCM consulted 6/5 intubated and bronch with removal of aspirated pill on bronchus intermedius 6/6 Self extubated  62024/06/29Worsening hypoxia with respiratory distress and WOB. Reintubated 6/10 tracheostomy  6/12-6/14 - PS weaning trials  62024/07/07PEA arrest 6/18 PS trial on vent support  6/19 weaned on pressure support 5/5 for 3 hours, very anxious  Consults:  Vascular surgery PCCM   Procedures:  6/1 CABG 6/1 L CEA  6/10 tracheostomy   Significant Diagnostic Tests:  Bronch 6/5: mucus plugs bilaterally, blue pill in BI   CXR 6July 07, 2024s/p code with improved aeration compared to prior CT Chest 62024/07/07with partial left lung collapse, airspace disease c/w aspiration vs pna   Micro Data:  6/5 BAL-Candida tropicalis 6/9 blood culture - no growth 62024/07/07blood culture-ng 607-Jul-2024trach aspirate-  enterobacter  >> S to cefepime/Cipro/gent/meropenem 62024/07/07urine culture- ng  Antimicrobials:  Cefuroxime 6/1 (surg ppx)  vanc 6/1 Unasyn 6/4>> 6/12 Cefepime 607-07-24>> Vancomycin  607-07-24>> off  Interim history/subjective:  In better spirits this AM. Marked discordance between A line and noninvasive cuff.  Objective   Blood pressure (!) 91/50, pulse 85, temperature 98.2 F (36.8 C), temperature source Oral, resp. rate (!) 26, height _0  (1.753 m), weight 59.7 kg, SpO2 99 %.    Vent Mode: PRVC FiO2 (%):  [30 %-40 %] 30 % Set Rate:  [24 bmp] 24 bmp Vt Set:  [560 mL] 560 mL PEEP:  [5 cmH20] 5 cmH20 Pressure Support:  [10 cmH20] 10 cmH20 Plateau Pressure:  [14 cmH20-22 cmH20] 14 cmH20   Intake/Output Summary (Last 24 hours) at 02/27/2020 0736 Last data filed at 02/27/2020 0600 Gross per 24 hour  Intake 1051.38 ml  Output 3150 ml  Net -2098.62 ml   Filed Weights   02/25/20 0500 02/26/20 0350 02/27/20 0422  Weight: 61.3 kg 61.1 kg 59.7 kg    Examination: GEN: frail elderly man in bed HEENT: trach in place, moderate mostly yellow secretions CV: RRR, +SEM, ext warm PULM: Rhonci and wheezing on both sides, +accessory muscle use intermittent GI: Soft, NGT in place EXT: Muscle wasting NEURO: moves all 4 ext, weak PSYCH: less anxious, writing thoughtful questions SKIN: sternotomy scar and central line scars healing, right chest tubes in place  CXR: emphysema, L>R airspace disease, stable chest tubes Worsening pancytopenia, mild Cr stable Net -2L  Assessment & Plan:   Acute hypoxic and hypercarbic respiratory failure Aspiration-right lower lobe foreign body found during bronchoscopy, Candida tropicalis on BAL culture. Hx tobacco  use Severe bullous emphysema present on chest x-ray imaging Tracheostomy 6/10 Pneumothorax on left, tube in place CT Chest on 6/15 with LUL collapse b/l airspace dz, rib fractures  -Continue PS and TC trials -Chest tube management per TCTS   Enterobacter HAP -Continue cefepime and tobramycin per TCTS, I think 7 days is reasonable  Persistent shock- no ongoing sepsis, echo looks fine, seems to just be vasoplegia from  prolonged hospital stay -Albumin, midodrine ongoing - May just want to go by cuff pressures if no change in mentation or Uop  Acute metabolic encephalopathy > improving Hx EtOH abuse Severe anxiety related to illness - Continue valium, oxycodone standing, wean off fentanyl gtt - Continuecelexa  CABG 6/1 L CEA 6/1 - ASA + plavix   H/o HTN Afib Thrombocytopenia > stable Cachexia, frailty - continue PT , TFs - Palliative following  Best practice:  Diet: Tube feeds Pain/Anxiety/Delirium protocol (if indicated): see above VAP protocol (if indicated): In place DVT prophylaxis: lovenox GI prophylaxis: protonix  Glucose control: SSI  Mobility: BR Code Status: partial  Family Communication: Per TCTS Disposition: ICU     The patient is critically ill with multiple organ systems failure and requires high complexity decision making for assessment and support, frequent evaluation and titration of therapies, application of advanced monitoring technologies and extensive interpretation of multiple databases. Critical Care Time devoted to patient care services described in this note independent of APP/resident time (if applicable)  is 32 minutes.   Erskine Emery MD [facility] 02/27/2020 7:36 AM Personal pager: [phone] If unanswered, please page CCM On-call: [phone]

## 2020-02-27 NOTE — Progress Notes (Signed)
OT Cancellation Note  Patient Details Name: Tony Munoz MRN: [id number] DOB: [birth year]   Cancelled Treatment:    Reason Eval/Treat Not Completed: Other (comment);Fatigue/lethargy limiting ability to participate; spoke with RN, pt just finished with hydro and notably fatigued, also reports difficult day with x2 weaning attempts today. OT to follow up as able.   Marcy Siren, OT Acute Rehabilitation Services Pager [phone] Office [phone]   [name] 02/27/2020, 4:08 PM

## 2020-02-28 ENCOUNTER — Inpatient Hospital Stay (HOSPITAL_COMMUNITY): Payer: Medicare HMO

## 2020-02-28 LAB — GLUCOSE, CAPILLARY
Glucose-Capillary: 110 mg/dL — ABNORMAL HIGH (ref 70–99)
Glucose-Capillary: 111 mg/dL — ABNORMAL HIGH (ref 70–99)
Glucose-Capillary: 115 mg/dL — ABNORMAL HIGH (ref 70–99)
Glucose-Capillary: 121 mg/dL — ABNORMAL HIGH (ref 70–99)
Glucose-Capillary: 121 mg/dL — ABNORMAL HIGH (ref 70–99)
Glucose-Capillary: 129 mg/dL — ABNORMAL HIGH (ref 70–99)
Glucose-Capillary: 96 mg/dL (ref 70–99)

## 2020-02-28 LAB — CBC
HCT: 26.2 % — ABNORMAL LOW (ref 39.0–52.0)
Hemoglobin: 8 g/dL — ABNORMAL LOW (ref 13.0–17.0)
MCH: 29.4 pg (ref 26.0–34.0)
MCHC: 30.5 g/dL (ref 30.0–36.0)
MCV: 96.3 fL (ref 80.0–100.0)
Platelets: 102 10*3/uL — ABNORMAL LOW (ref 150–400)
RBC: 2.72 MIL/uL — ABNORMAL LOW (ref 4.22–5.81)
RDW: 18.6 % — ABNORMAL HIGH (ref 11.5–15.5)
WBC: 3.9 10*3/uL — ABNORMAL LOW (ref 4.0–10.5)
nRBC: 0 % (ref 0.0–0.2)

## 2020-02-28 LAB — BASIC METABOLIC PANEL
Anion gap: 10 (ref 5–15)
BUN: 29 mg/dL — ABNORMAL HIGH (ref 8–23)
CO2: 27 mmol/L (ref 22–32)
Calcium: 8.4 mg/dL — ABNORMAL LOW (ref 8.9–10.3)
Chloride: 100 mmol/L (ref 98–111)
Creatinine, Ser: 0.72 mg/dL (ref 0.61–1.24)
GFR calc Af Amer: >60 mL/min (ref >60)
GFR calc non Af Amer: >60 mL/min (ref >60)
Glucose, Bld: 118 mg/dL — ABNORMAL HIGH (ref 70–99)
Potassium: 4 mmol/L (ref 3.5–5.1)
Sodium: 137 mmol/L (ref 135–145)

## 2020-02-28 LAB — MAGNESIUM: Magnesium: 2 mg/dL (ref 1.7–2.4)

## 2020-02-28 MED ORDER — FE FUMARATE-B12-VIT C-FA-IFC PO CAPS
1.0000 | ORAL_CAPSULE | Freq: Three times a day (TID) | ORAL | Status: DC
  Administered 2020-02-28 – 2020-03-02 (×9): 1 via ORAL
  Filled 2020-02-28 (×10): qty 1

## 2020-02-28 MED ORDER — HYDROXYZINE HCL 10 MG PO TABS
10.0000 mg | ORAL_TABLET | Freq: Three times a day (TID) | ORAL | Status: AC
  Administered 2020-02-28 – 2020-02-29 (×6): 10 mg via ORAL
  Filled 2020-02-28 (×6): qty 1

## 2020-02-28 MED ORDER — HYDROXYZINE HCL 10 MG PO TABS
10.0000 mg | ORAL_TABLET | Freq: Three times a day (TID) | ORAL | Status: DC | PRN
  Administered 2020-03-01: 10 mg via ORAL
  Filled 2020-02-28 (×3): qty 1

## 2020-02-28 NOTE — Progress Notes (Signed)
Patient states he is tired on ATC wean. RT placed  Patient back on PS/CPAP with the vent.  Patient tolerating well at this time.

## 2020-02-28 NOTE — Progress Notes (Signed)
Patient ID: Tony Munoz, male   DOB: 16-May-1956, 64 y.o.   MRN: [id number]  This NP visited patient at the bedside as a follow up  for palliative medicine needs and emotional support.  Patient remains ventilator dependent/trach intact.  Communication is difficult secondary to current medical situation, Tony Munoz utilizes pen and paper and tries to mouth words but it is difficult        He mostly defers to [employer] for communication and decisions.  Continued education regarding  patient's current medical situation and his high risk for decompensation 2/2 to vent dependence, COPD, coronary artery disease, skin infection, immobility, coccyx wound.  Education offered regarding adult failure to thrive and the limitations of medical interventions to prolong quality of life when the body does fail to thrive.  Tony Munoz is very clear that both patient and family support are  open to all offered and available medical interventions to prolong life  The likely next steps for transition of care will be LTAC, there will be ongoing conversation regarding goals of care, treatment option decisions,  advanced directive decisions and anticipatory care needs.  Tony Munoz speaks to his hope regarding the possibility of once the patient is "off the ventilator have him moved to [place]".  Education offered regarding consideration of the logistics of moving a patient out of state who requires skilled nursing level care.  Discussed with patient and Tony Munoz the importance of continued conversation with each other  and the  medical providers regarding overall plan of care and treatment options,  ensuring decisions are within the context of the patients values and GOCs.  Questions and concerns addressed      PMT will continue to support holistically  Total time spent on the unit was 35 minutes  Greater than 50% of the time was spent in counseling and coordination of care  Lorinda Creed NP  Palliative Medicine  Team Team Phone # [phone] Pager [phone]

## 2020-02-28 NOTE — Progress Notes (Signed)
NAME:  Tony Munoz, MRN:  [id number], DOB:  05/10/1956, LOS: 35 ADMISSION DATE:  02/06/2020, CONSULTATION DATE:  02/08/20 REFERRING MD:  Orvan Seen - CVTS, CHIEF COMPLAINT:  SOB, hypoxia   Brief History   64 yo M with CAD, HTN, COPD admit for elective CABG and L CEA, with progressive respiratory failure with hypoxia and hypercarbia and intubated on POD 3. Self extubated 6/6 and required re-intubation 03/05/23 Coded early morning of 03-13-23, requiring CPR done for two rounds, 41m epinephrine given, 1 amp Bicarb. He was profoundly hypotensive at that point, so a push of epinephrine was given. Patient developed PEA a second time, achieving ROSC after 2 round of CPR, 2 mg Epi.   Past Medical History  EtOH use  CAD  Anxiety Tobacco use disorder HTN   Significant Hospital Events   6/1 CABG and L CEA  6/2 receiving BZD for possible EtOH withdrawals. Was on precedex  6/3 worsening respiratory status with worsening hypoxia. Received 40 mg lasix. PCCM consulted 6/5 intubated and bronch with removal of aspirated pill on bronchus intermedius 6/6 Self extubated  6June 28, 2024Worsening hypoxia with respiratory distress and WOB. Reintubated 6/10 tracheostomy  6/12-6/14 - PS weaning trials  62024-07-06PEA arrest 6/18 PS trial on vent support  6/19 weaned on pressure support 5/5 for 3 hours, very anxious  Consults:  Vascular surgery PCCM   Procedures:  6/1 CABG 6/1 L CEA  6/10 tracheostomy   Significant Diagnostic Tests:  Bronch 6/5: mucus plugs bilaterally, blue pill in BI   CXR 6Jul 06, 2024s/p code with improved aeration compared to prior CT Chest 6July 06, 2024with partial left lung collapse, airspace disease c/w aspiration vs pna   Micro Data:  6/5 BAL-Candida tropicalis 6/9 blood culture - no growth 607-06-24blood culture-ng 6Jul 06, 2024trach aspirate-  enterobacter  >> S to cefepime/Cipro/gent/meropenem 607-06-24urine culture- ng  Antimicrobials:  Cefuroxime 6/1 (surg ppx)  vanc 6/1 Unasyn 6/4>> 6/12 Cefepime 62024-07-06>> Vancomycin  607/02/2023>> off  Interim history/subjective:  Still anxious and unconfortable. On PS. De-recruited with TC yesterday.  Objective   Blood pressure 119/70, pulse 76, temperature 98.5 F (36.9 C), resp. rate (!) 24, height _0  (1.753 m), weight 61.5 kg, SpO2 99 %.    Vent Mode: PRVC FiO2 (%):  [30 %] 30 % Set Rate:  [24 bmp] 24 bmp Vt Set:  [550 mL] 550 mL PEEP:  [5 cmH20] 5 cmH20 Plateau Pressure:  [15 cmH20-23 cmH20] 15 cmH20   Intake/Output Summary (Last 24 hours) at 02/28/2020 0735 Last data filed at 02/28/2020 0600 Gross per 24 hour  Intake 1437.39 ml  Output 1860 ml  Net -422.61 ml   Filed Weights   02/26/20 0350 02/27/20 0422 02/28/20 0500  Weight: 61.1 kg 59.7 kg 61.5 kg    Examination: GEN: frail elderly man in bed HEENT: trach in place, moderate mostly yellow secretions CV: RRR, +SEM, ext warm PULM: Rhonci and wheezing on both sides, +accessory muscle use intermittent GI: Soft, NGT in place EXT: Muscle wasting NEURO: moves all 4 ext, weak PSYCH: more anxious today SKIN: sternotomy scar and central line scars healing, right chest tubes in place  CXR: emphysema, bilateral airspace disease Worsening pancytopenia, mild Cr stable Net --400cc  Assessment & Plan:   Acute hypoxic and hypercarbic respiratory failure Aspiration-right lower lobe foreign body found during bronchoscopy, Candida tropicalis on BAL culture. Hx tobacco use Severe bullous emphysema present on chest x-ray imaging Severe COPD by PFTs Tracheostomy 6/10 Pneumothorax on left, tube in  place CT Chest on 6/15 with LUL collapse b/l airspace dz, rib fractures  -Continue PS and TC trials -Chest tube management per TCTS   Enterobacter HAP- completed 7 days cefepime and tobramycin  Persistent shock- no ongoing sepsis, echo looks fine, seems to just be vasoplegia from prolonged hospital stay vs. Inaccurate A line - RN to ask Julien Girt  Acute metabolic encephalopathy > improving Hx EtOH abuse Severe  anxiety related to illness - Continue valium, oxycodone standing - Add trial of hydroxyzine  CABG 6/1 L CEA 6/1 - ASA + plavix   H/o HTN Afib Thrombocytopenia > stable Cachexia, frailty - continue PT , TFs - Palliative following  Best practice:  Diet: Tube feeds Pain/Anxiety/Delirium protocol (if indicated): see above VAP protocol (if indicated): In place DVT prophylaxis: lovenox GI prophylaxis: protonix  Glucose control: SSI  Mobility: BR Code Status: partial  Family Communication: Per TCTS Disposition: ICU    The patient is critically ill with multiple organ systems failure and requires high complexity decision making for assessment and support, frequent evaluation and titration of therapies, application of advanced monitoring technologies and extensive interpretation of multiple databases. Critical Care Time devoted to patient care services described in this note independent of APP/resident time (if applicable)  is 31 minutes.   Erskine Emery MD [facility] 02/28/2020 7:35 AM Personal pager: [phone] If unanswered, please page CCM On-call: [phone]

## 2020-02-28 NOTE — Consult Note (Signed)
WOC Nurse wound follow up I received a SecureChat message from [employer], PT, that the patient cannot tolerate hydrotherapy to the coccyx wound.  Therefore, I have modified the santyl order to reflect that the primary RNs are to perform daily. Helmut Muster, RN, MSN, CWOCN, CNS-BC, pager [phone]

## 2020-02-28 NOTE — Progress Notes (Signed)
EVENING ROUNDS NOTE :     [address]       [place] [address]             [phone]                 22 Days Post-Op Procedure(s) (LRB): CORONARY ARTERY BYPASS GRAFTING (CABG),  x Four with Bilateral IMAs, and right leg greater saphenous vein (N/A) TRANSESOPHAGEAL ECHOCARDIOGRAM (TEE) (N/A) INDOCYANINE GREEN FLUORESCENCE IMAGING (ICG) (N/A) CAROTID ENDARTERECTOMY LEFT with  Patch Angioplasty Using Hemashield Platinum Finesse patch (Left)   Total Length of Stay:  LOS: 22 days  Events: Placed back on pressure support No events    BP 107/68   Pulse 74   Temp 97.8 F (36.6 C)   Resp 15   Ht 5\' 9"  (1.753 m)   Wt 61.5 kg   SpO2 97%   BMI 20.02 kg/m      Vent Mode: PSV;CPAP FiO2 (%):  [30 %-40 %] 30 % Set Rate:  [24 bmp] 24 bmp Vt Set:  [550 mL] 550 mL PEEP:  [5 cmH20] 5 cmH20 Pressure Support:  [10 cmH20-12 cmH20] 12 cmH20 Plateau Pressure:  [15 cmH20-17 cmH20] 17 cmH20  . sodium chloride 10 mL/hr at 02/28/20 1600  . feeding supplement (VITAL AF 1.2 CAL) 1,000 mL (02/28/20 1311)  . norepinephrine (LEVOPHED) Adult infusion Stopped (02/28/20 1324)    I/O last 3 completed shifts: In: 2468.7 [I.V.:488.7; NG/GT:1780; IV Piggyback:200] Out: 3005 [Urine:2595; Stool:20; Chest Tube:390]   CBC Latest Ref Rng & Units 02/28/2020 02/27/2020 02/26/2020  WBC 4.0 - 10.5 K/uL 3.9(L) 3.1(L) 5.1  Hemoglobin 13.0 - 17.0 g/dL 8.0(L) 7.8(L) 8.5(L)  Hematocrit 39 - 52 % 26.2(L) 25.0(L) 27.2(L)  Platelets 150 - 400 K/uL 102(L) 102(L) 147(L)    BMP Latest Ref Rng & Units 02/28/2020 02/27/2020 02/26/2020  Glucose 70 - 99 mg/dL 02/28/2020) 078(M) 754(G)  BUN 8 - 23 mg/dL 920(F) 00(F) 12(R)  Creatinine 0.61 - 1.24 mg/dL 97(J 8.83 2.54  BUN/Creat Ratio 10 - 24 - - -  Sodium 135 - 145 mmol/L 137 136 138  Potassium 3.5 - 5.1 mmol/L 4.0 4.2 3.6  Chloride 98 - 111 mmol/L 100 102 104  CO2 22 - 32 mmol/L 27 27 26   Calcium 8.9 - 10.3 mg/dL 9.82) ) 7.9(L)    ABG      Component Value Date/Time   PHART 7.538 (H) 02/26/2020 0317   PCO2ART 35.1 02/26/2020 0317   PO2ART 87 02/26/2020 0317   HCO3 29.9 (H) 02/26/2020 0317   TCO2 31 02/26/2020 0317   ACIDBASEDEF 3.0 (H) 02/20/2020 0434   O2SAT 98.0 02/26/2020 0317       02/22/2020, MD 02/28/2020 7:02 PM

## 2020-02-28 NOTE — Progress Notes (Signed)
22 Days Post-Op Procedure(s) (LRB): CORONARY ARTERY BYPASS GRAFTING (CABG),  x Four with Bilateral IMAs, and right leg greater saphenous vein (N/A) TRANSESOPHAGEAL ECHOCARDIOGRAM (TEE) (N/A) INDOCYANINE GREEN FLUORESCENCE IMAGING (ICG) (N/A) CAROTID ENDARTERECTOMY LEFT with  Patch Angioplasty Using Hemashield Platinum Finesse patch (Left) Subjective: No complaints  Objective: Vital signs in last 24 hours: Temp:  [97.7 F (36.5 C)-98.5 F (36.9 C)] 98.5 F (36.9 C) (06/23 0731) Pulse Rate:  [67-90] 75 (06/23 0806) Cardiac Rhythm: Normal sinus rhythm (06/23 0400) Resp:  [18-29] 18 (06/23 0806) BP: (87-122)/(49-72) 87/49 (06/23 0806) SpO2:  [96 %-100 %] 100 % (06/23 0806) Arterial Line BP: (71-115)/(41-61) 95/54 (06/23 0645) FiO2 (%):  [30 %] 30 % (06/23 0806) Weight:  [61.5 kg] 61.5 kg (06/23 0500)  Hemodynamic parameters for last 24 hours:    Intake/Output from previous day: 06/22 0701 - 06/23 0700 In: 1437.4 [I.V.:372.4; NG/GT:865; IV Piggyback:200] Out: 1860 [Urine:1600; Stool:20; Chest Tube:240] Intake/Output this shift: No intake/output data recorded.  General appearance: alert and cooperative Neurologic: intact Heart: regular rate and rhythm, S1, S2 normal, no murmur, click, rub or gallop Lungs: diminished breath sounds bilaterally Abdomen: soft, non-tender; bowel sounds normal; no masses,  no organomegaly Extremities: extremities normal, atraumatic, no cyanosis or edema Wound: c/d/i  Lab Results: Recent Labs    02/27/20 0410 02/28/20 0853  WBC 3.1* 3.9*  HGB 7.8* 8.0*  HCT 25.0* 26.2*  PLT 102* 102*   BMET:  Recent Labs    02/27/20 0410 02/28/20 0853  NA 136 137  K 4.2 4.0  CL 102 100  CO2 27 27  GLUCOSE 100* 118*  BUN 28* 29*  CREATININE 0.73 0.72  CALCIUM 8.2* 8.4*    PT/INR: No results for input(s): LABPROT, INR in the last 72 hours. ABG    Component Value Date/Time   PHART 7.538 (H) 02/26/2020 0317   HCO3 29.9 (H) 02/26/2020 0317   TCO2  31 02/26/2020 0317   ACIDBASEDEF 3.0 (H) 02/20/2020 0434   O2SAT 98.0 02/26/2020 0317   CBG (last 3)  Recent Labs    02/28/20 0021 02/28/20 0457 02/28/20 0729  GLUCAP 115* 111* 121*    Assessment/Plan: S/P Procedure(s) (LRB): CORONARY ARTERY BYPASS GRAFTING (CABG),  x Four with Bilateral IMAs, and right leg greater saphenous vein (N/A) TRANSESOPHAGEAL ECHOCARDIOGRAM (TEE) (N/A) INDOCYANINE GREEN FLUORESCENCE IMAGING (ICG) (N/A) CAROTID ENDARTERECTOMY LEFT with  Patch Angioplasty Using Hemashield Platinum Finesse patch (Left) chest tubes to water seal Continue vent wean Consider consult to [facility] for prolonged ventilator wean   LOS: 22 days    [name] 02/28/2020

## 2020-02-29 ENCOUNTER — Encounter: Payer: Medicare HMO | Admitting: Vascular Surgery

## 2020-02-29 DIAGNOSIS — Z9911 Dependence on respirator [ventilator] status: Secondary | ICD-10-CM

## 2020-02-29 DIAGNOSIS — R627 Adult failure to thrive: Secondary | ICD-10-CM

## 2020-02-29 LAB — GLUCOSE, CAPILLARY
Glucose-Capillary: 103 mg/dL — ABNORMAL HIGH (ref 70–99)
Glucose-Capillary: 114 mg/dL — ABNORMAL HIGH (ref 70–99)
Glucose-Capillary: 117 mg/dL — ABNORMAL HIGH (ref 70–99)
Glucose-Capillary: 127 mg/dL — ABNORMAL HIGH (ref 70–99)
Glucose-Capillary: 134 mg/dL — ABNORMAL HIGH (ref 70–99)
Glucose-Capillary: 137 mg/dL — ABNORMAL HIGH (ref 70–99)

## 2020-02-29 LAB — FERRITIN: Ferritin: 242 ng/mL (ref 24–336)

## 2020-02-29 LAB — BASIC METABOLIC PANEL
Anion gap: 10 (ref 5–15)
BUN: 36 mg/dL — ABNORMAL HIGH (ref 8–23)
CO2: 28 mmol/L (ref 22–32)
Calcium: 8.3 mg/dL — ABNORMAL LOW (ref 8.9–10.3)
Chloride: 98 mmol/L (ref 98–111)
Creatinine, Ser: 0.7 mg/dL (ref 0.61–1.24)
GFR calc Af Amer: >60 mL/min (ref >60)
GFR calc non Af Amer: >60 mL/min (ref >60)
Glucose, Bld: 116 mg/dL — ABNORMAL HIGH (ref 70–99)
Potassium: 4.3 mmol/L (ref 3.5–5.1)
Sodium: 136 mmol/L (ref 135–145)

## 2020-02-29 LAB — CBC
HCT: 26 % — ABNORMAL LOW (ref 39.0–52.0)
Hemoglobin: 8.2 g/dL — ABNORMAL LOW (ref 13.0–17.0)
MCH: 30.6 pg (ref 26.0–34.0)
MCHC: 31.5 g/dL (ref 30.0–36.0)
MCV: 97 fL (ref 80.0–100.0)
Platelets: 96 10*3/uL — ABNORMAL LOW (ref 150–400)
RBC: 2.68 MIL/uL — ABNORMAL LOW (ref 4.22–5.81)
RDW: 19.1 % — ABNORMAL HIGH (ref 11.5–15.5)
WBC: 4.4 10*3/uL (ref 4.0–10.5)
nRBC: 0 % (ref 0.0–0.2)

## 2020-02-29 LAB — IRON AND TIBC
Iron: 37 ug/dL — ABNORMAL LOW (ref 45–182)
Saturation Ratios: 13 % — ABNORMAL LOW (ref 17.9–39.5)
TIBC: 281 ug/dL (ref 250–450)
UIBC: 244 ug/dL

## 2020-02-29 LAB — LIPID PANEL
Cholesterol: 94 mg/dL (ref 0–200)
HDL: 25 mg/dL — ABNORMAL LOW (ref >40)
Total CHOL/HDL Ratio: 3.8 RATIO
Triglycerides: 65 mg/dL (ref <150)
VLDL: 13 mg/dL (ref 0–40)

## 2020-02-29 LAB — MAGNESIUM: Magnesium: 2 mg/dL (ref 1.7–2.4)

## 2020-02-29 NOTE — Progress Notes (Signed)
Physical Therapy Treatment Patient Details Name: Tony Munoz MRN: [id number] DOB: March 29, 1956 Today's Date: 02/29/2020    History of Present Illness 64 y.o male presented for surgery 02/06/20 for CABGx4 , bilateral IMA,Left carotid endartectomy. Complications from ETOH, worsening respiratory function requiring bipap. Extubated 06/02, off bipap 06/04. 6/5 pt re-intubated d/t bil mucus plugs, bronch with removal of aspirated pill on bronchus intermedius, pt self-extubated 6/6, re-intubated 6/7 due to worsening hypoxia with respiratory distress and WOB, trach 6/10, PEA arrest 6/15. PMHx includes radiculopathy, HTN, dyspnea, smoker, carotid artery occulsion, anxiety.    PT Comments    Pt with fem art line out and finally able to progress to EOB and full standing. Pt nervous about mobility and remains somewhat perseverative on Yonker use with RN suctioning prior to mobility. Pt able to sit and stand x 2 with 2 person assist and no knee buckling. Pt fatigues quickly but demonstrates strength significant for starting transitions OOB to chair. Will continue to follow.   BP 108/74 (86 spO2 96% on 30% fio2 vent  CPAP    Follow Up Recommendations  LTACH;Supervision/Assistance - 24 hour     Equipment Recommendations  Wheelchair (measurements PT);Hospital bed    Recommendations for Other Services       Precautions / Restrictions Precautions Precautions: Sternal;Fall;Other (comment) Precaution Comments: cortrak, flexiseal, chest tube, trach, vent    Mobility  Bed Mobility Overal bed mobility: Needs Assistance Bed Mobility: Supine to Sit;Sit to Supine           General bed mobility comments: pt able to transition from supine to sit with HOB 30 degrees and assist of pad to pivot to right with max assist to fully pivot and elevate trunk from surface. Mod +2 for return to supine with management of lines and cues for precautions  Transfers Overall transfer level: Needs assistance    Transfers: Sit to/from Stand Sit to Stand: Mod assist;+2 safety/equipment         General transfer comment: mod +2 with knees blocked and bil UE support to stand from bed x 2 trials without knee buckling. Pt did not want to attempt OOB to chair  Ambulation/Gait             General Gait Details: unable at this time   Stairs             Wheelchair Mobility    Modified Rankin (Stroke Patients Only)       Balance Overall balance assessment: Needs assistance   Sitting balance-Leahy Scale: Fair Sitting balance - Comments: pt able to sit EOB with guarding for safety     Standing balance-Leahy Scale: Poor Standing balance comment: bil UE support in standing                            Cognition Arousal/Alertness: Awake/alert Behavior During Therapy: WFL for tasks assessed/performed Overall Cognitive Status: Difficult to assess Area of Impairment: Memory                   Current Attention Level: Sustained   Following Commands: Follows one step commands consistently       General Comments: pt writing he is going to move rooms      Exercises      General Comments        Pertinent Vitals/Pain Pain Assessment: Faces Pain Score: 3  Pain Location: generalized  Pain Descriptors / Indicators: Discomfort;Grimacing Pain Intervention(s): Monitored during session;Repositioned  Home Living                      Prior Function            PT Goals (current goals can now be found in the care plan section) Progress towards PT goals: Progressing toward goals    Frequency    Min 3X/week      PT Plan Discharge plan needs to be updated    Co-evaluation PT/OT/SLP Co-Evaluation/Treatment: Yes Reason for Co-Treatment: Complexity of the patient's impairments (multi-system involvement);For patient/therapist safety PT goals addressed during session: Mobility/safety with mobility        AM-PAC PT "6 Clicks" Mobility    Outcome Measure  Help needed turning from your back to your side while in a flat bed without using bedrails?: A Lot Help needed moving from lying on your back to sitting on the side of a flat bed without using bedrails?: A Lot Help needed moving to and from a bed to a chair (including a wheelchair)?: A Lot Help needed standing up from a chair using your arms (e.g., wheelchair or bedside chair)?: A Lot Help needed to walk in hospital room?: Total Help needed climbing 3-5 steps with a railing? : Total 6 Click Score: 10    End of Session   Activity Tolerance: Patient tolerated treatment well Patient left: in bed;with call bell/phone within reach;with nursing/sitter in room Nurse Communication: Mobility status PT Visit Diagnosis: Other abnormalities of gait and mobility (R26.89);Difficulty in walking, not elsewhere classified (R26.2)     Time: 0459-9774 PT Time Calculation (min) (ACUTE ONLY): 26 min  Charges:  $Therapeutic Activity: 8-22 mins                     Abdullah Rizzi P, PT Acute Rehabilitation Services Pager: [phone] Office: [phone]    Merelyn Klump B Nikash Mortensen 02/29/2020, 1:15 PM

## 2020-02-29 NOTE — Progress Notes (Signed)
Occupational Therapy Treatment Patient Details Name: Tony Munoz MRN: [id number] DOB: April 22, 1956 Today's Date: 02/29/2020    History of present illness 64 y.o male presented for surgery 02/06/20 for CABGx4 , bilateral IMA,Left carotid endartectomy. Complications from ETOH, worsening respiratory function requiring bipap. Extubated 06/02, off bipap 06/04. 6/5 pt re-intubated d/t bil mucus plugs, bronch with removal of aspirated pill on bronchus intermedius, pt self-extubated 6/6, re-intubated 6/7 due to worsening hypoxia with respiratory distress and WOB, trach 6/10, PEA arrest 6/15. PMHx includes radiculopathy, HTN, dyspnea, smoker, carotid artery occulsion, anxiety.   OT comments  Pt requiring maximum encouragement to work with therapies today. Mod assist needed to achieve sitting EOB, sat with flexed posture and min guard assist. Pt stood with 2 person moderate assistance, but declined transfer to chair due to fatigue. Pt remains on ventilator, but femoral line removed. No longer requires tilt bed, RN aware.  Follow Up Recommendations  LTACH;Supervision/Assistance - 24 hour    Equipment Recommendations  Other (comment) (defer to next venue)    Recommendations for Other Services      Precautions / Restrictions Precautions Precautions: Sternal;Fall;Other (comment) Precaution Comments: cortrak, flexiseal, chest tube, trach, vent       Mobility Bed Mobility Overal bed mobility: Needs Assistance Bed Mobility: Supine to Sit;Sit to Supine     Supine to sit: Mod assist;+2 for safety/equipment Sit to supine: Mod assist;+2 for physical assistance   General bed mobility comments: pt able to transition from supine to sit with HOB 30 degrees and assist of pad to pivot to right with max assist to fully pivot and elevate trunk from surface. Mod +2 for return to supine with management of lines and cues for precautions  Transfers Overall transfer level: Needs assistance Equipment used: 2  person hand held assist Transfers: Sit to/from Stand Sit to Stand: Mod assist;+2 safety/equipment         General transfer comment: mod +2 with knees blocked and bil UE support to stand from bed x 2 trials without knee buckling. Pt did not want to attempt OOB to chair    Balance Overall balance assessment: Needs assistance   Sitting balance-Leahy Scale: Fair Sitting balance - Comments: pt able to sit EOB with guarding for safety     Standing balance-Leahy Scale: Poor Standing balance comment: bil UE support in standing and moderate assist                           ADL either performed or assessed with clinical judgement   ADL Overall ADL's : Needs assistance/impaired                                       General ADL Comments: pt using suction to clear mouth of secretions     Vision       Perception     Praxis      Cognition Arousal/Alertness: Awake/alert Behavior During Therapy: Flat affect Overall Cognitive Status: Difficult to assess Area of Impairment: Following commands;Memory;Attention                   Current Attention Level: Sustained Memory: Decreased recall of precautions;Decreased short-term memory Following Commands: Follows one step commands with increased time Safety/Judgement: Decreased awareness of safety;Decreased awareness of deficits     General Comments: pt communicating by writing, aware he was moving out to another room  after having been notified by CM that he was going to [facility]        Exercises     Shoulder Instructions       General Comments      Pertinent Vitals/ Pain       Pain Assessment: Faces Pain Score: 3  Faces Pain Scale: Hurts little more Pain Location: generalized  Pain Descriptors / Indicators: Discomfort;Grimacing Pain Intervention(s): Monitored during session;Repositioned  Home Living                                          Prior Functioning/Environment               Frequency  Min 2X/week        Progress Toward Goals  OT Goals(current goals can now be found in the care plan section)  Progress towards OT goals: Progressing toward goals  Acute Rehab OT Goals Patient Stated Goal: none stated, agreeable to working with therapies OT Goal Formulation: With patient Time For Goal Achievement: 03/14/20 Potential to Achieve Goals: Fair  Plan Discharge plan needs to be updated    Co-evaluation    PT/OT/SLP Co-Evaluation/Treatment: Yes Reason for Co-Treatment: For patient/therapist safety PT goals addressed during session: Mobility/safety with mobility OT goals addressed during session: Strengthening/ROM      AM-PAC OT "6 Clicks" Daily Activity     Outcome Measure   Help from another person eating meals?: Total Help from another person taking care of personal grooming?: A Lot Help from another person toileting, which includes using toliet, bedpan, or urinal?: Total Help from another person bathing (including washing, rinsing, drying)?: A Lot Help from another person to put on and taking off regular upper body clothing?: A Lot Help from another person to put on and taking off regular lower body clothing?: Total 6 Click Score: 9    End of Session Equipment Utilized During Treatment: Other (comment) (vent)  OT Visit Diagnosis: Muscle weakness (generalized) (M62.81);Other symptoms and signs involving cognitive function;Pain   Activity Tolerance Patient limited by fatigue   Patient Left in bed;with call bell/phone within reach;with nursing/sitter in room   Nurse Communication Mobility status        Time: 1105-1130 OT Time Calculation (min): 25 min  Charges: OT General Charges $OT Visit: 1 Visit OT Treatments $Therapeutic Activity: 8-22 mins  Martie Round, OTR/L Acute Rehabilitation Services Pager: [phone] Office: [phone]   Evern Bio 02/29/2020, 2:19 PM

## 2020-02-29 NOTE — Progress Notes (Signed)
NAME:  Tony Munoz, MRN:  [id number], DOB:  1956/03/03, LOS: 77 ADMISSION DATE:  02/06/2020, CONSULTATION DATE:  02/08/20 REFERRING MD:  Orvan Seen - CVTS, CHIEF COMPLAINT:  SOB, hypoxia   Brief History   64 yo M with CAD, HTN, COPD admit for elective CABG and L CEA, with progressive respiratory failure with hypoxia and hypercarbia and intubated on POD 3. Self extubated 6/6 and required re-intubation February 19, 2023 Coded early morning of 2023-02-27, requiring CPR done for two rounds, 39m epinephrine given, 1 amp Bicarb. He was profoundly hypotensive at that point, so a push of epinephrine was given. Patient developed PEA a second time, achieving ROSC after 2 round of CPR, 2 mg Epi.   Past Medical History  EtOH use  CAD  Anxiety Tobacco use disorder HTN   Significant Hospital Events   6/1 CABG and L CEA  6/2 receiving BZD for possible EtOH withdrawals. Was on precedex  6/3 worsening respiratory status with worsening hypoxia. Received 40 mg lasix. PCCM consulted 6/5 intubated and bronch with removal of aspirated pill on bronchus intermedius 6/6 Self extubated  606/14/24Worsening hypoxia with respiratory distress and WOB. Reintubated 6/10 tracheostomy  6/12-6/14 - PS weaning trials  6June 22, 2024PEA arrest 6/18 PS trial on vent support  6/19 weaned on pressure support 5/5 for 3 hours, very anxious  Consults:  Vascular surgery PCCM   Procedures:  6/1 CABG 6/1 L CEA  6/10 tracheostomy   Significant Diagnostic Tests:  Bronch 6/5: mucus plugs bilaterally, blue pill in BI   CXR 6Jun 22, 2024s/p code with improved aeration compared to prior CT Chest 62024/06/22with partial left lung collapse, airspace disease c/w aspiration vs pna   Micro Data:  6/5 BAL-Candida tropicalis 6/9 blood culture - no growth 6June 22, 2024blood culture-ng 606/22/24trach aspirate-  enterobacter  >> S to cefepime/Cipro/gent/meropenem 606-22-24urine culture- ng  Antimicrobials:  Cefuroxime 6/1 (surg ppx)  vanc 6/1 Unasyn 6/4>> 6/12 Cefepime 622-Jun-2024>> Vancomycin  606/22/24>> off  Interim history/subjective:  Appears more comfortable today. Slept well. TC 4 hours yesterday.  Objective   Blood pressure 94/64, pulse 69, temperature 98.1 F (36.7 C), resp. rate (!) 24, height _0  (1.753 m), weight 61.4 kg, SpO2 98 %.    Vent Mode: PRVC FiO2 (%):  [30 %-40 %] 30 % Set Rate:  [24 bmp] 24 bmp Vt Set:  [560 mL] 560 mL PEEP:  [5 cmH20] 5 cmH20 Pressure Support:  [10 cmH20-12 cmH20] 12 cmH20 Plateau Pressure:  [15 cmH20-23 cmH20] 15 cmH20   Intake/Output Summary (Last 24 hours) at 02/29/2020 0738 Last data filed at 02/29/2020 0700 Gross per 24 hour  Intake 2090.81 ml  Output 2535 ml  Net -444.19 ml   Filed Weights   02/27/20 0422 02/28/20 0500 02/29/20 0500  Weight: 59.7 kg 61.5 kg 61.4 kg    Examination: GEN: frail elderly man in bed HEENT: trach in place, moderate mostly yellow secretions CV: RRR, +SEM, ext warm PULM: Rhonci bilaterally, less accessory muscle use GI: Soft, NGT in place EXT: Muscle wasting NEURO: moves all 4 ext, weak PSYCH: less anxious today SKIN: sternotomy scar and central line scars healing, right chest tubes in place  Stable pancytopenia Stable chemistries  Assessment & Plan:   Acute hypoxic and hypercarbic respiratory failure Aspiration-right lower lobe foreign body found during bronchoscopy, Candida tropicalis on BAL culture. Hx tobacco use Severe bullous emphysema present on chest x-ray imaging Severe COPD by PFTs Tracheostomy 6/10 Pneumothorax on left, tube in place CT Chest  on 6/15 with LUL collapse b/l airspace dz, rib fractures  -Continue PS and TC trials -Chest tube management per TCTS   Enterobacter HAP- completed 7 days cefepime and tobramycin  Persistent shock- improved  Acute metabolic encephalopathy > improving Hx EtOH abuse Severe anxiety related to illness - Continue valium, oxycodone standing - trial of hydroxyzine  CABG 6/1 L CEA 6/1 - ASA + plavix   H/o  HTN Afib Thrombocytopenia > stable Cachexia, frailty - continue PT , TFs - Palliative following  Best practice:  Diet: Tube feeds Pain/Anxiety/Delirium protocol (if indicated): see above VAP protocol (if indicated): In place DVT prophylaxis: lovenox GI prophylaxis: protonix  Glucose control: SSI  Mobility: BR Code Status: partial  Family Communication: Per TCTS Disposition: ICU    The patient is critically ill with multiple organ systems failure and requires high complexity decision making for assessment and support, frequent evaluation and titration of therapies, application of advanced monitoring technologies and extensive interpretation of multiple databases. Critical Care Time devoted to patient care services described in this note independent of APP/resident time (if applicable)  is 33 minutes.   Erskine Emery MD [facility] 02/29/2020 7:38 AM Personal pager: [phone] If unanswered, please page CCM On-call: [phone]

## 2020-02-29 NOTE — Progress Notes (Signed)
TCTS BRIEF SICU PROGRESS NOTE  23 Days Post-Op  S/P Procedure(s) (LRB): CORONARY ARTERY BYPASS GRAFTING (CABG),  x Four with Bilateral IMAs, and right leg greater saphenous vein (N/A) TRANSESOPHAGEAL ECHOCARDIOGRAM (TEE) (N/A) INDOCYANINE GREEN FLUORESCENCE IMAGING (ICG) (N/A) CAROTID ENDARTERECTOMY LEFT with  Patch Angioplasty Using Hemashield Platinum Finesse patch (Left)   Stable day  Plan: Continue current plan  Purcell Nails, MD 02/29/2020 5:04 PM

## 2020-02-29 NOTE — TOC Initial Note (Signed)
Transition of Care [facility]) - Initial/Assessment Note    Patient Details  Name: Tony Munoz MRN: [id number] Date of Birth: 01/31/1956  Transition of Care [facility]) CM/SW Contact:    Janae Bridgeman, RN Phone Number: 02/29/2020, 11:30 AM  Clinical Narrative:                 Case management attempted to speak to the patient this morning regarding Medicare choice for LTAC placement.  Patient currently on the ventilator with trach intact and had recently received medications causing sedation.  The patient asked that I speak with the nephew, [name].  I called [name] and spoke with him at length concerning transitions of care.  [name] was in agreement for LTAc placement and [facility] choice and [facility] second choice.  The nephew states that the ultimate goal would be for patient to live in a skilled facility in [place] , [place] near family but the family realizes that this may not be a realistic choice due to finances.  The patient lived alone in a mobile home prior to this hospitalization.  The patient's partner lpassed away this past October and the nearest family lives in [place].  I called Farris Has, RNCM with [facility] about possible placement.  Will continue to follow for acceptance and bed availability once she is able to assess the patient.  Expected Discharge Plan: Long Term Acute Care (LTAC) Barriers to Discharge: Continued Medical Work up   Patient Goals and CMS Choice Patient states their goals for this hospitalization and ongoing recovery are:: [name], nephew) and patient are in agreement for [facility] and [facility]. CMS Medicare.gov Compare Post Acute Care list provided to:: Patient Represenative (must comment) (presented to nephew, [name] - patient sedated at the time of assessment) Choice offered to / list presented to : [facility] POA / Guardian  Expected Discharge Plan and  Services Expected Discharge Plan: Long Term Acute Care (LTAC)   Discharge Planning Services: CM Consult Post Acute Care Choice: Long Term Acute Care (LTAC) Living arrangements for the past 2 months: Mobile Home                                      Prior Living Arrangements/Services Living arrangements for the past 2 months: Mobile Home Lives with:: Self Patient language and need for interpreter reviewed:: Yes Do you feel safe going back to the place where you live?: No   patient lives alone and family contact lives in [place], [place]  Need for Family Participation in Patient Care: Yes (Comment) Care giver support system in place?: Yes (comment)   Criminal Activity/Legal Involvement Pertinent to Current Situation/Hospitalization: No - Comment as needed  Activities of Daily Living Home Assistive Devices/Equipment: None ADL Screening (condition at time of admission) Patient's cognitive ability adequate to safely complete daily activities?: Yes Is the patient deaf or have difficulty hearing?: Yes Does the patient have difficulty seeing, even when wearing glasses/contacts?: No Does the patient have difficulty concentrating, remembering, or making decisions?: No Patient able to express need for assistance with ADLs?: Yes Does the patient have difficulty dressing or bathing?: No Independently performs ADLs?: Yes (appropriate for developmental age) Does the patient have difficulty walking or climbing stairs?: Yes Weakness of Legs: None Weakness of Arms/Hands: None  Permission Sought/Granted Permission sought to share information with : Case Manager Permission  granted to share information with : Yes, Verbal Permission Granted     Permission granted to share info w AGENCY: LTAC - [facility] and [facility].  Permission granted to share info w Relationship: Okey Regal - Nephew     Emotional Assessment Appearance:: Appears stated  age Attitude/Demeanor/Rapport: Unresponsive Affect (typically observed): Quiet Orientation: : Oriented to Self, Oriented to  Time, Oriented to Place Alcohol / Substance Use: Not Applicable Psych Involvement: No (comment)  Admission diagnosis:  Carotid stenosis, asymptomatic, left [I65.22] S/P CABG x 4 [Z95.1] Patient Active Problem List   Diagnosis Date Noted  . Palliative care by specialist   . Goals of care, counseling/discussion   . Pressure injury of skin 02/14/2020  . Hypoxia   . Acute respiratory failure ([place])   . COPD with acute exacerbation ([place])   . Protein-calorie malnutrition, severe 02/08/2020  . Carotid stenosis, asymptomatic, left 02/06/2020  . S/P CABG x 4 02/06/2020  . Coronary artery disease involving native heart without angina pectoris   . Abnormal nuclear stress test 01/09/2020  . SOB (shortness of breath) 12/20/2019  . Abnormal EKG 12/20/2019  . Essential hypertension 12/20/2019  . Bilateral carotid bruits 12/20/2019  . Cigarette smoker 12/20/2019  . Prominent abdominal aortic pulsation 12/20/2019  . Radiculopathy 02/15/2019   PCP:  Serita Grammes, MD Pharmacy:   [facility], [place] [address] [place] [address] Phone: [phone] Fax: [phone]     Social Determinants of Health (SDOH) Interventions    Readmission Risk Interventions Readmission Risk Prevention Plan 02/29/2020  Transportation Screening Complete  PCP or Specialist Appt within 3-5 Days Complete  HRI or [place] Complete  Social Work Consult for [place] Planning/Counseling Complete  Palliative Care Screening Complete  Medication Review Press photographer) Complete

## 2020-03-01 ENCOUNTER — Other Ambulatory Visit: Payer: Self-pay | Admitting: Thoracic Surgery (Cardiothoracic Vascular Surgery)

## 2020-03-01 ENCOUNTER — Inpatient Hospital Stay (HOSPITAL_COMMUNITY): Payer: Medicare HMO

## 2020-03-01 DIAGNOSIS — Z951 Presence of aortocoronary bypass graft: Secondary | ICD-10-CM

## 2020-03-01 LAB — BASIC METABOLIC PANEL
Anion gap: 10 (ref 5–15)
Anion gap: 12 (ref 5–15)
BUN: 34 mg/dL — ABNORMAL HIGH (ref 8–23)
BUN: 48 mg/dL — ABNORMAL HIGH (ref 8–23)
CO2: 27 mmol/L (ref 22–32)
CO2: 23 mmol/L (ref 22–32)
Calcium: 8.2 mg/dL — ABNORMAL LOW (ref 8.9–10.3)
Calcium: 7.1 mg/dL — ABNORMAL LOW (ref 8.9–10.3)
Chloride: 97 mmol/L — ABNORMAL LOW (ref 98–111)
Chloride: 99 mmol/L (ref 98–111)
Creatinine, Ser: 0.73 mg/dL (ref 0.61–1.24)
Creatinine, Ser: 2.26 mg/dL — ABNORMAL HIGH (ref 0.61–1.24)
GFR calc Af Amer: >60 mL/min (ref >60)
GFR calc Af Amer: 34 mL/min — ABNORMAL LOW (ref >60)
GFR calc non Af Amer: >60 mL/min (ref >60)
GFR calc non Af Amer: 29 mL/min — ABNORMAL LOW (ref >60)
Glucose, Bld: 124 mg/dL — ABNORMAL HIGH (ref 70–99)
Glucose, Bld: 178 mg/dL — ABNORMAL HIGH (ref 70–99)
Potassium: 3.9 mmol/L (ref 3.5–5.1)
Potassium: 4.8 mmol/L (ref 3.5–5.1)
Sodium: 134 mmol/L — ABNORMAL LOW (ref 135–145)
Sodium: 134 mmol/L — ABNORMAL LOW (ref 135–145)

## 2020-03-01 LAB — URINALYSIS, ROUTINE W REFLEX MICROSCOPIC
Bilirubin Urine: NEGATIVE
Glucose, UA: NEGATIVE mg/dL
Hgb urine dipstick: NEGATIVE
Ketones, ur: NEGATIVE mg/dL
Leukocytes,Ua: NEGATIVE
Nitrite: NEGATIVE
Protein, ur: NEGATIVE mg/dL
Specific Gravity, Urine: 1.017 (ref 1.005–1.030)
pH: 6 (ref 5.0–8.0)

## 2020-03-01 LAB — CBC
HCT: 26.8 % — ABNORMAL LOW (ref 39.0–52.0)
Hemoglobin: 8.4 g/dL — ABNORMAL LOW (ref 13.0–17.0)
MCH: 30.4 pg (ref 26.0–34.0)
MCHC: 31.3 g/dL (ref 30.0–36.0)
MCV: 97.1 fL (ref 80.0–100.0)
Platelets: 106 10*3/uL — ABNORMAL LOW (ref 150–400)
RBC: 2.76 MIL/uL — ABNORMAL LOW (ref 4.22–5.81)
RDW: 19.4 % — ABNORMAL HIGH (ref 11.5–15.5)
WBC: 4.3 10*3/uL (ref 4.0–10.5)
nRBC: 0 % (ref 0.0–0.2)

## 2020-03-01 LAB — GLUCOSE, CAPILLARY
Glucose-Capillary: 109 mg/dL — ABNORMAL HIGH (ref 70–99)
Glucose-Capillary: 112 mg/dL — ABNORMAL HIGH (ref 70–99)
Glucose-Capillary: 124 mg/dL — ABNORMAL HIGH (ref 70–99)
Glucose-Capillary: 125 mg/dL — ABNORMAL HIGH (ref 70–99)
Glucose-Capillary: 131 mg/dL — ABNORMAL HIGH (ref 70–99)
Glucose-Capillary: 89 mg/dL (ref 70–99)

## 2020-03-01 LAB — MAGNESIUM: Magnesium: 2 mg/dL (ref 1.7–2.4)

## 2020-03-01 MED ORDER — MIDODRINE HCL 5 MG PO TABS
5.0000 mg | ORAL_TABLET | Freq: Three times a day (TID) | ORAL | Status: DC
  Administered 2020-03-01 – 2020-03-02 (×6): 5 mg via ORAL
  Filled 2020-03-01 (×4): qty 1

## 2020-03-01 MED ORDER — FUROSEMIDE 10 MG/ML IJ SOLN
40.0000 mg | Freq: Two times a day (BID) | INTRAMUSCULAR | Status: DC
  Administered 2020-03-01 – 2020-03-02 (×3): 40 mg via INTRAVENOUS
  Filled 2020-03-01 (×3): qty 4

## 2020-03-01 MED ORDER — POTASSIUM CHLORIDE CRYS ER 20 MEQ PO TBCR
40.0000 meq | EXTENDED_RELEASE_TABLET | Freq: Once | ORAL | Status: AC
  Administered 2020-03-01: 40 meq via ORAL
  Filled 2020-03-01: qty 2

## 2020-03-01 MED ORDER — ADULT MULTIVITAMIN W/MINERALS CH
1.0000 | ORAL_TABLET | Freq: Every day | ORAL | Status: DC
  Administered 2020-03-02: 1 via ORAL
  Filled 2020-03-01: qty 1

## 2020-03-01 MED ORDER — FINASTERIDE 5 MG PO TABS
5.0000 mg | ORAL_TABLET | Freq: Every day | ORAL | Status: DC
  Administered 2020-03-01 – 2020-03-04 (×4): 5 mg via ORAL
  Filled 2020-03-01 (×4): qty 1

## 2020-03-01 MED ORDER — PANCRELIPASE (LIP-PROT-AMYL) 10440-39150 UNITS PO TABS
20880.0000 [IU] | ORAL_TABLET | Freq: Once | ORAL | Status: DC
  Filled 2020-03-01: qty 2

## 2020-03-01 MED ORDER — ALUM & MAG HYDROXIDE-SIMETH 200-200-20 MG/5ML PO SUSP
15.0000 mL | Freq: Four times a day (QID) | ORAL | Status: DC | PRN
  Administered 2020-03-01: 15 mL
  Filled 2020-03-01: qty 30

## 2020-03-01 MED ORDER — SODIUM BICARBONATE 650 MG PO TABS
650.0000 mg | ORAL_TABLET | Freq: Once | ORAL | Status: DC
  Filled 2020-03-01: qty 1

## 2020-03-01 NOTE — Progress Notes (Signed)
Physical Therapy Treatment Patient Details Name: Tony Munoz MRN: [id number] DOB: 09-15-1955 Today's Date: 03/01/2020    History of Present Illness 64 y.o male presented for surgery 02/06/20 for CABGx4 , bilateral IMA,Left carotid endartectomy. Complications from ETOH, worsening respiratory function requiring bipap. Extubated 06/02, off bipap 06/04. 6/5 pt re-intubated d/t bil mucus plugs, bronch with removal of aspirated pill on bronchus intermedius, pt self-extubated 6/6, re-intubated 6/7 due to worsening hypoxia with respiratory distress and WOB, trach 6/10, PEA arrest 6/15. PMHx includes radiculopathy, HTN, dyspnea, smoker, carotid artery occulsion, anxiety.    PT Comments    Pt with eyes closed majority of session today but stating he wants to go home, that he is afraid of falling and that he will not get out of bed to chair until next date. Pt willing to stand and step with return to bed and HEP. Pt educated for need to progress mobility to gain strength and function. Nephew present end of session.   CPAP 30% FiO2 with SpO2 98% HR 90 BP 146/84 (99) EOB    Follow Up Recommendations  LTACH;Supervision/Assistance - 24 hour     Equipment Recommendations  Wheelchair (measurements PT);Hospital bed    Recommendations for Other Services       Precautions / Restrictions Precautions Precautions: Sternal;Fall;Other (comment) Precaution Comments: cortrak, flexiseal, chest tube, trach, vent    Mobility  Bed Mobility Overal bed mobility: Needs Assistance Bed Mobility: Rolling;Sidelying to Sit;Sit to Supine Rolling: Mod assist Sidelying to sit: Mod assist;HOB elevated   Sit to supine: Mod assist;+2 for physical assistance   General bed mobility comments: mod assist to bend knee and roll toward right with physical assist to bring legs off of bed and elevate trunk. Return to bed physical assist to move legs onto surface and control trunk. total +2 to slide toward  [facility]  Transfers Overall transfer level: Needs assistance   Transfers: Sit to/from Stand Sit to Stand: Mod assist;+2 safety/equipment         General transfer comment: mod +2 with knees blocked and bil UE support to stand from bed and side step 1' toward [facility]. Pt declined further standing or transfers to chair  Ambulation/Gait                 Stairs             Wheelchair Mobility    Modified Rankin (Stroke Patients Only)       Balance Overall balance assessment: Needs assistance   Sitting balance-Leahy Scale: Poor Sitting balance - Comments: EOB with min assist for stability, pt reports fear of falling in upright with BP stable     Standing balance-Leahy Scale: Poor Standing balance comment: bil UE support in standing and moderate assist                            Cognition Arousal/Alertness: Awake/alert Behavior During Therapy: Flat affect Overall Cognitive Status: Difficult to assess Area of Impairment: Following commands;Memory;Attention                 Orientation Level: Time Current Attention Level: Sustained Memory: Decreased recall of precautions;Decreased short-term memory Following Commands: Follows one step commands with increased time       General Comments: pt communicating by writing, states he wants to get stronger to go home but continues to state "tomorrow" when activity progression attempted      Exercises General Exercises - Lower Extremity Short Arc Quad:  AROM;Both;Supine;10 reps Hip ABduction/ADduction: AROM;Both;Seated;10 reps    General Comments        Pertinent Vitals/Pain Pain Score: 5  Pain Location: neck Pain Descriptors / Indicators: Discomfort;Grimacing Pain Intervention(s): Limited activity within patient's tolerance;Monitored during session;Repositioned    Home Living                      Prior Function            PT Goals (current goals can now be found in the care plan  section) Progress towards PT goals: Progressing toward goals    Frequency    Min 2X/week      PT Plan Current plan remains appropriate;Frequency needs to be updated    Co-evaluation              AM-PAC PT "6 Clicks" Mobility   Outcome Measure  Help needed turning from your back to your side while in a flat bed without using bedrails?: A Lot Help needed moving from lying on your back to sitting on the side of a flat bed without using bedrails?: A Lot Help needed moving to and from a bed to a chair (including a wheelchair)?: Total Help needed standing up from a chair using your arms (e.g., wheelchair or bedside chair)?: A Lot Help needed to walk in hospital room?: Total Help needed climbing 3-5 steps with a railing? : Total 6 Click Score: 9    End of Session   Activity Tolerance: Patient tolerated treatment well Patient left: in bed;with call bell/phone within reach;with family/visitor present Nurse Communication: Mobility status PT Visit Diagnosis: Other abnormalities of gait and mobility (R26.89);Difficulty in walking, not elsewhere classified (R26.2)     Time: 6767-2094 PT Time Calculation (min) (ACUTE ONLY): 26 min  Charges:  $Therapeutic Exercise: 8-22 mins $Therapeutic Activity: 8-22 mins                     Wei Poplaski P, PT Acute Rehabilitation Services Pager: [phone] Office: [phone]    Rajah Lamba B Kahla Risdon 03/01/2020, 1:53 PM

## 2020-03-01 NOTE — Progress Notes (Signed)
Nutrition Follow-up  DOCUMENTATION CODES:   Severe malnutrition in context of chronic illness  INTERVENTION:   Recommend PEG placement for long term nutrition  Tube Feeding:  Vital AF 1.2 at 65 ml/hr Provides 1872 kcals, 117 g of protein and 1264 mL of free water Meets 100% estimated calorie and protein needs  Continue Juven BID, each packet provides 80 calories, 8 grams of carbohydrate, 2.5  grams of protein (collagen), 7 grams of L-arginine and 7 grams of L-glutamine; supplement contains CaHMB, Vitamins C, E, B12 and Zinc to promote wound healing  -D/C liquid MVI, start MVI with minerals   NUTRITION DIAGNOSIS:   Severe Malnutrition related to chronic illness (cerebrovascular disease) as evidenced by severe fat depletion, severe muscle depletion.  Ongoing  GOAL:   Patient will meet greater than or equal to 90% of their needs  Addressed via TF  MONITOR:   Vent status, Diet advancement, TF tolerance, Skin, Weight trends, Labs  REASON FOR ASSESSMENT:   Rounds    ASSESSMENT:   Patient with PMH significant for HTN, severe cerebrovascular disease, CAD, and s/p multilevel C-spine fusion a few months ago. Presents this admission for CABG surgery.  6/01 CABG x 4, Left CEA 6/05 Intubated, Bronch with removal of aspirated pill  6/06 Self-Extubated 6/07 Re-Intubated, Cortrak 6/10 Trach placed 6/15 PEA cardiac arrest x 2, CT chest with LIL collapse b/ airspace dx, rib fractures  Pt discussed during ICU rounds and with RN.   Unable to tolerate vent wean yesterday. Chest tubes removed. Having issues with urinary retention. Tolerating tube feedings. Plan LTAC placement. Pt would benefit from long term feeding tube given chronic poor PO prior to admission and severity of malnutrition.   Admission weight: 59.7 kg  Current weight: 63.4 kg   I/O: +930 ml since 6/11 UOP: 550 ml x 24 hrs Rectal tube: 200 ml x 24 hrs  Chest tubes: 340  Medications: dulcolax, colace,  trinsicon, folic acid, 40 mg lasix BID, SS novolog, MVI, thiamine  Labs: Na 134 (L) CBG 103-137  Diet Order:   Diet Order            Diet NPO time specified  Diet effective now                 EDUCATION NEEDS:   Education needs have been addressed  Skin:  Skin Assessment: Skin Integrity Issues: Skin Integrity Issues:: Unstageable Unstageable: sacrum Incisions: R leg, chest, neck  Last BM:  6/25  Height:   Ht Readings from Last 1 Encounters:  02/06/20 5\' 9"  (1.753 m)    Weight:   Wt Readings from Last 1 Encounters:  03/01/20 63.4 kg    BMI:  Body mass index is 20.64 kg/m.  Estimated Nutritional Needs:   Kcal:  1680-1960 kcals  Protein:  90-115 g  Fluid:  >/= 1.8 L/day  03/03/20 RD, LDN Clinical Nutrition Pager listed in AMION

## 2020-03-01 NOTE — Progress Notes (Signed)
24 Days Post-Op Procedure(s) (LRB): CORONARY ARTERY BYPASS GRAFTING (CABG),  x Four with Bilateral IMAs, and right leg greater saphenous vein (N/A) TRANSESOPHAGEAL ECHOCARDIOGRAM (TEE) (N/A) INDOCYANINE GREEN FLUORESCENCE IMAGING (ICG) (N/A) CAROTID ENDARTERECTOMY LEFT with  Patch Angioplasty Using Hemashield Platinum Finesse patch (Left) Subjective: No complaints  Objective: Vital signs in last 24 hours: Temp:  [97.7 F (36.5 C)-98.1 F (36.7 C)] 98.1 F (36.7 C) (06/25 0337) Pulse Rate:  [37-145] 78 (06/25 0756) Cardiac Rhythm: Normal sinus rhythm (06/25 0400) Resp:  [13-29] 26 (06/25 0756) BP: (90-153)/(59-98) 135/94 (06/25 0756) SpO2:  [91 %-100 %] 100 % (06/25 0756) FiO2 (%):  [30 %] 30 % (06/25 0756) Weight:  [63.4 kg] 63.4 kg (06/25 0546)  Hemodynamic parameters for last 24 hours:    Intake/Output from previous day: 06/24 0701 - 06/25 0700 In: 1314.7 [I.V.:219.7; NG/GT:845] Out: 1090 [Urine:550; Stool:200; Chest Tube:340] Intake/Output this shift: Total I/O In: 866.1 [I.V.:28.6; NG/GT:837.4] Out: 224 [Urine:150; Emesis/NG output:40; Chest Tube:34]  General appearance: alert and cooperative Neurologic: intact Heart: regular rate and rhythm, S1, S2 normal, no murmur, click, rub or gallop Lungs: rales bilaterally Abdomen: soft, non-tender; bowel sounds normal; no masses,  no organomegaly Extremities: edema minimal Wound: c/d/i  Lab Results: Recent Labs    02/29/20 0508 03/01/20 0520  WBC 4.4 4.3  HGB 8.2* 8.4*  HCT 26.0* 26.8*  PLT 96* 106*   BMET:  Recent Labs    02/29/20 0508 03/01/20 0520  NA 136 134*  K 4.3 3.9  CL 98 97*  CO2 28 27  GLUCOSE 116* 124*  BUN 36* 34*  CREATININE 0.70 0.73  CALCIUM 8.3* 8.2*    PT/INR: No results for input(s): LABPROT, INR in the last 72 hours. ABG    Component Value Date/Time   PHART 7.538 (H) 02/26/2020 0317   HCO3 29.9 (H) 02/26/2020 0317   TCO2 31 02/26/2020 0317   ACIDBASEDEF 3.0 (H) 02/20/2020 0434    O2SAT 98.0 02/26/2020 0317   CBG (last 3)  Recent Labs    02/29/20 2330 03/01/20 0333 03/01/20 0756  GLUCAP 137* 109* 131*    Assessment/Plan: S/P Procedure(s) (LRB): CORONARY ARTERY BYPASS GRAFTING (CABG),  x Four with Bilateral IMAs, and right leg greater saphenous vein (N/A) TRANSESOPHAGEAL ECHOCARDIOGRAM (TEE) (N/A) INDOCYANINE GREEN FLUORESCENCE IMAGING (ICG) (N/A) CAROTID ENDARTERECTOMY LEFT with  Patch Angioplasty Using Hemashield Platinum Finesse patch (Left) remove chest tubes  Diuresis Continue vent wean [facility] when bed available   LOS: 24 days    [name] 03/01/2020

## 2020-03-01 NOTE — TOC Transition Note (Signed)
Transition of Care [facility]) - CM/SW Discharge Note   Patient Details  Name: PLEASANT BENSINGER MRN: [id number] Date of Birth: Apr 25, 1956  [facility] CM/SW Contact:  Janae Bridgeman, RN Phone Number: 03/01/2020, 11:52 AM   Clinical Narrative:    I spoke with Farris Has, RNCM with [facility] and they are starting insurance precertification for the patient's possible admission to the LTAC if insurance gets approved. Will continue to follow.  Final next level of care: Long Term Acute Care (LTAC) Barriers to Discharge: Continued Medical Work up   Patient Goals and CMS Choice Patient states their goals for this hospitalization and ongoing recovery are:: [name], nephew) and patient are in agreement for [facility]. CMS Medicare.gov Compare Post Acute Care list provided to:: Patient Represenative (must comment) (presented to nephew, [name] - patient sedated at the time of assessment) Choice offered to / list presented to : [facility] POA / Guardian  Discharge Placement                       Discharge Plan and Services   Discharge Planning Services: CM Consult Post Acute Care Choice: Long Term Acute Care (LTAC)                               Social Determinants of Health (SDOH) Interventions     Readmission Risk Interventions Readmission Risk Prevention Plan 02/29/2020  Transportation Screening Complete  PCP or Specialist Appt within 3-5 Days Complete  HRI or Home Care Consult Complete  Social Work Consult for Recovery Care Planning/Counseling Complete  Palliative Care Screening Complete  Medication Review Oceanographer) Complete

## 2020-03-01 NOTE — Progress Notes (Signed)
NAME:  Tony Munoz, MRN:  [id number], DOB:  04-14-1956, LOS: 24 ADMISSION DATE:  02/06/2020, CONSULTATION DATE:  02/08/20 REFERRING MD:  Orvan Seen - CVTS, CHIEF COMPLAINT:  SOB, hypoxia   Brief History   64 yo M with CAD, HTN, COPD admit for elective CABG and L CEA, with progressive respiratory failure with hypoxia and hypercarbia and intubated on POD 3. Self extubated 6/6 and required re-intubation March 12, 2023 Coded early morning of 03-20-23, requiring CPR done for two rounds, 55m epinephrine given, 1 amp Bicarb. He was profoundly hypotensive at that point, so a push of epinephrine was given. Patient developed PEA a second time, achieving ROSC after 2 round of CPR, 2 mg Epi.   Past Medical History  EtOH use  CAD  Anxiety Tobacco use disorder HTN   Significant Hospital Events   6/1 CABG and L CEA  6/2 receiving BZD for possible EtOH withdrawals. Was on precedex  6/3 worsening respiratory status with worsening hypoxia. Received 40 mg lasix. PCCM consulted 6/5 intubated and bronch with removal of aspirated pill on bronchus intermedius 6/6 Self extubated  6July 05, 2024Worsening hypoxia with respiratory distress and WOB. Reintubated 6/10 tracheostomy  6/12-6/14 - PS weaning trials  613-Jul-2024PEA arrest 6/18 PS trial on vent support  6/19 weaned on pressure support 5/5 for 3 hours, very anxious  Consults:  Vascular surgery PCCM   Procedures:  6/1 CABG 6/1 L CEA  6/10 tracheostomy   Significant Diagnostic Tests:  Bronch 6/5: mucus plugs bilaterally, blue pill in BI   CXR 607/13/24s/p code with improved aeration compared to prior CT Chest 607/13/24with partial left lung collapse, airspace disease c/w aspiration vs pna   Micro Data:  6/5 BAL-Candida tropicalis 6/9 blood culture - no growth 6Jul 13, 2024blood culture-ng 607-13-24trach aspirate-  enterobacter  >> S to cefepime/Cipro/gent/meropenem 607-13-24urine culture- ng  Antimicrobials:  Cefuroxime 6/1 (surg ppx)  vanc 6/1 Unasyn 6/4>> 6/12 Cefepime 6July 13, 2024>> Vancomycin  6July 13, 2024>> off  Interim history/subjective:  No events. Feels like he cannot empty his bladder completely.  Objective   Blood pressure 99/67, pulse 80, temperature 98.1 F (36.7 C), temperature source Axillary, resp. rate 19, height _0  (1.753 m), weight 63.4 kg, SpO2 99 %.    Vent Mode: PRVC FiO2 (%):  [30 %] 30 % Set Rate:  [24 bmp] 24 bmp Vt Set:  [560 mL] 560 mL PEEP:  [5 cmH20] 5 cmH20 Pressure Support:  [12 cmH20] 12 cmH20 Plateau Pressure:  [16 cmH20-17 cmH20] 16 cmH20   Intake/Output Summary (Last 24 hours) at 03/01/2020 0736 Last data filed at 03/01/2020 0500 Gross per 24 hour  Intake 1314.73 ml  Output 1090 ml  Net 224.73 ml   Filed Weights   02/28/20 0500 02/29/20 0500 03/01/20 0546  Weight: 61.5 kg 61.4 kg 63.4 kg    Examination: GEN: frail elderly man in bed HEENT: trach in place, mostly oral secretions now CV: RRR, +SEM, ext warm PULM: Rhonci bilaterally, less accessory muscle use GI: Soft, NGT in place EXT: Muscle wasting NEURO: moves all 4 ext, weak PSYCH: less anxious today SKIN: sternotomy scar and central line scars healing,  Stable pancytopenia Stable chemistries Bps better  Assessment & Plan:   Acute hypoxic and hypercarbic respiratory failure Aspiration-right lower lobe foreign body found during bronchoscopy, Candida tropicalis on BAL culture. Hx tobacco use Severe bullous emphysema present on chest x-ray imaging Severe COPD by PFTs Tracheostomy 6/10 Pneumothorax on left, tube in place CT Chest on 607/13/2024with  LUL collapse b/l airspace dz, rib fractures  -Continue PS and TC trials -Chest tube management per TCTS: potentially out today -Trial of diuretics  Enterobacter HAP- completed 7 days cefepime and tobramycin  Persistent shock- resolved, taper midodrine  Acute metabolic encephalopathy > improving Hx EtOH abuse Severe anxiety related to illness - Continue valium, oxycodone standing - change hydroxyzine to PRN given reported  incomplete emptying  Bladder issues- check UA and PVR, start finasteride  CABG 6/1 L CEA 6/1 - ASA + plavix   H/o HTN Afib Thrombocytopenia > stable Cachexia, frailty - continue PT , TFs - Palliative following  Stable for LTACH from my standpoint  Best practice:  Diet: Tube feeds Pain/Anxiety/Delirium protocol (if indicated): see above VAP protocol (if indicated): In place DVT prophylaxis: lovenox GI prophylaxis: protonix  Glucose control: SSI  Mobility: BR Code Status: partial  Family Communication: Per TCTS Disposition: ICU pending vent liberation    The patient is critically ill with multiple organ systems failure and requires high complexity decision making for assessment and support, frequent evaluation and titration of therapies, application of advanced monitoring technologies and extensive interpretation of multiple databases. Critical Care Time devoted to patient care services described in this note independent of APP/resident time (if applicable)  is 32 minutes.    Erskine Emery MD [place] 03/01/2020 7:36 AM Personal pager: [phone] If unanswered, please page CCM On-call: [phone]

## 2020-03-02 ENCOUNTER — Inpatient Hospital Stay (HOSPITAL_COMMUNITY): Payer: Medicare HMO

## 2020-03-02 DIAGNOSIS — L89159 Pressure ulcer of sacral region, unspecified stage: Secondary | ICD-10-CM

## 2020-03-02 DIAGNOSIS — Z515 Encounter for palliative care: Secondary | ICD-10-CM

## 2020-03-02 DIAGNOSIS — R627 Adult failure to thrive: Secondary | ICD-10-CM

## 2020-03-02 LAB — BASIC METABOLIC PANEL
Anion gap: 11 (ref 5–15)
BUN: 41 mg/dL — ABNORMAL HIGH (ref 8–23)
CO2: 31 mmol/L (ref 22–32)
Calcium: 8.4 mg/dL — ABNORMAL LOW (ref 8.9–10.3)
Chloride: 96 mmol/L — ABNORMAL LOW (ref 98–111)
Creatinine, Ser: 0.98 mg/dL (ref 0.61–1.24)
GFR calc Af Amer: >60 mL/min (ref >60)
GFR calc non Af Amer: >60 mL/min (ref >60)
Glucose, Bld: 110 mg/dL — ABNORMAL HIGH (ref 70–99)
Potassium: 3.9 mmol/L (ref 3.5–5.1)
Sodium: 138 mmol/L (ref 135–145)

## 2020-03-02 LAB — CBC
HCT: 28 % — ABNORMAL LOW (ref 39.0–52.0)
Hemoglobin: 8.8 g/dL — ABNORMAL LOW (ref 13.0–17.0)
MCH: 30.4 pg (ref 26.0–34.0)
MCHC: 31.4 g/dL (ref 30.0–36.0)
MCV: 96.9 fL (ref 80.0–100.0)
Platelets: 144 10*3/uL — ABNORMAL LOW (ref 150–400)
RBC: 2.89 MIL/uL — ABNORMAL LOW (ref 4.22–5.81)
RDW: 19.9 % — ABNORMAL HIGH (ref 11.5–15.5)
WBC: 5.5 10*3/uL (ref 4.0–10.5)
nRBC: 0 % (ref 0.0–0.2)

## 2020-03-02 LAB — GLUCOSE, CAPILLARY
Glucose-Capillary: 106 mg/dL — ABNORMAL HIGH (ref 70–99)
Glucose-Capillary: 102 mg/dL — ABNORMAL HIGH (ref 70–99)
Glucose-Capillary: 127 mg/dL — ABNORMAL HIGH (ref 70–99)
Glucose-Capillary: 131 mg/dL — ABNORMAL HIGH (ref 70–99)
Glucose-Capillary: 111 mg/dL — ABNORMAL HIGH (ref 70–99)
Glucose-Capillary: 113 mg/dL — ABNORMAL HIGH (ref 70–99)

## 2020-03-02 LAB — MAGNESIUM: Magnesium: 2.2 mg/dL (ref 1.7–2.4)

## 2020-03-02 MED ORDER — ADULT MULTIVITAMIN W/MINERALS CH: 1.0000 | ORAL_TABLET | Freq: Every day | ORAL | Status: DC

## 2020-03-02 MED ORDER — THIAMINE HCL 100 MG PO TABS
100.0000 mg | ORAL_TABLET | Freq: Every day | ORAL | Status: DC
  Administered 2020-03-03 – 2020-03-04 (×2): 100 mg
  Filled 2020-03-02 (×2): qty 1

## 2020-03-02 MED ORDER — HYDROXYZINE HCL 10 MG PO TABS
10.0000 mg | ORAL_TABLET | Freq: Three times a day (TID) | ORAL | Status: DC | PRN
  Filled 2020-03-02: qty 1

## 2020-03-02 MED ORDER — PANTOPRAZOLE SODIUM 40 MG PO PACK
40.0000 mg | PACK | Freq: Every day | ORAL | Status: DC
  Administered 2020-03-03 – 2020-03-04 (×2): 40 mg
  Filled 2020-03-02 (×2): qty 20

## 2020-03-02 MED ORDER — CLONAZEPAM 1 MG PO TABS
2.0000 mg | ORAL_TABLET | Freq: Two times a day (BID) | ORAL | Status: DC
  Administered 2020-03-02 – 2020-03-04 (×4): 2 mg
  Filled 2020-03-02 (×4): qty 2

## 2020-03-02 MED ORDER — FOLIC ACID 1 MG PO TABS
1.0000 mg | ORAL_TABLET | Freq: Every day | ORAL | Status: DC
  Administered 2020-03-03 – 2020-03-04 (×2): 1 mg
  Filled 2020-03-02 (×2): qty 1

## 2020-03-02 MED ORDER — CLONAZEPAM 1 MG PO TABS
2.0000 mg | ORAL_TABLET | Freq: Two times a day (BID) | ORAL | Status: DC
  Administered 2020-03-02: 2 mg via ORAL
  Filled 2020-03-02: qty 2

## 2020-03-02 MED ORDER — CITALOPRAM HYDROBROMIDE 20 MG PO TABS
20.0000 mg | ORAL_TABLET | Freq: Every day | ORAL | Status: DC
  Administered 2020-03-03 – 2020-03-04 (×2): 20 mg
  Filled 2020-03-02 (×2): qty 1

## 2020-03-02 MED ORDER — QUETIAPINE FUMARATE 25 MG PO TABS
25.0000 mg | ORAL_TABLET | Freq: Two times a day (BID) | ORAL | Status: DC
  Administered 2020-03-02 – 2020-03-04 (×4): 25 mg
  Filled 2020-03-02 (×4): qty 1

## 2020-03-02 MED ORDER — QUETIAPINE FUMARATE 25 MG PO TABS
25.0000 mg | ORAL_TABLET | Freq: Two times a day (BID) | ORAL | Status: DC
  Administered 2020-03-02: 25 mg via ORAL
  Filled 2020-03-02: qty 1

## 2020-03-02 MED ORDER — FERROUS SULFATE 300 (60 FE) MG/5ML PO SYRP
300.0000 mg | ORAL_SOLUTION | Freq: Two times a day (BID) | ORAL | Status: DC
  Administered 2020-03-02 – 2020-03-04 (×4): 300 mg
  Filled 2020-03-02 (×5): qty 5

## 2020-03-02 MED ORDER — ADULT MULTIVITAMIN LIQUID CH
15.0000 mL | Freq: Every day | ORAL | Status: DC
  Administered 2020-03-03 – 2020-03-04 (×2): 15 mL
  Filled 2020-03-02 (×2): qty 15

## 2020-03-02 MED ORDER — MIDODRINE HCL 5 MG PO TABS
5.0000 mg | ORAL_TABLET | Freq: Three times a day (TID) | ORAL | Status: DC
  Administered 2020-03-03 – 2020-03-04 (×5): 5 mg
  Filled 2020-03-02 (×5): qty 1

## 2020-03-02 NOTE — Progress Notes (Signed)
TCTS Rounds  Stable from thoracic  Surgery standpoint Chest tubes removed yesterday; cxr clear without PTX today Appreciate CCM assistance. Continue supportive care; anticipate discharge to vent facility. Lanora Reveron Z. Vickey Sages, MD [phone]

## 2020-03-02 NOTE — Progress Notes (Signed)
NAME:  Tony Munoz, MRN:  [id number], DOB:  06-03-1956, LOS: 22 ADMISSION DATE:  02/06/2020, CONSULTATION DATE:  02/08/20 REFERRING MD:  Orvan Seen - CVTS, CHIEF COMPLAINT:  SOB, hypoxia   Brief History   64 yo M with CAD, HTN, COPD admit for elective CABG and L CEA, with progressive respiratory failure with hypoxia and hypercarbia and intubated on POD 3. Self extubated 6/6 and required re-intubation 03-06-2023 Coded early morning of 2023/03/14, requiring CPR done for two rounds, 4m epinephrine given, 1 amp Bicarb. He was profoundly hypotensive at that point, so a push of epinephrine was given. Patient developed PEA a second time, achieving ROSC after 2 round of CPR, 2 mg Epi.   Past Medical History  EtOH use  CAD  Anxiety Tobacco use disorder HTN   Significant Hospital Events   6/1 CABG and L CEA  6/2 receiving BZD for possible EtOH withdrawals. Was on precedex  6/3 worsening respiratory status with worsening hypoxia. Received 40 mg lasix. PCCM consulted 6/5 intubated and bronch with removal of aspirated pill on bronchus intermedius 6/6 Self extubated  6Jun 29, 2024Worsening hypoxia with respiratory distress and WOB. Reintubated 6/10 tracheostomy  6/12-6/14 - PS weaning trials  6Jul 07, 2024PEA arrest 6/18 PS trial on vent support  6/19 weaned on pressure support 5/5 for 3 hours, very anxious  Consults:  Vascular surgery PCCM   Procedures:  6/1 CABG 6/1 L CEA  6/10 tracheostomy   Significant Diagnostic Tests:  Bronch 6/5: mucus plugs bilaterally, blue pill in BI   CXR 607-07-2024s/p code with improved aeration compared to prior CT Chest 607-07-24with partial left lung collapse, airspace disease c/w aspiration vs pna   Micro Data:  6/5 BAL-Candida tropicalis 6/9 blood culture - no growth 607/07/2024blood culture-ng 6Jul 07, 2024trach aspirate-  enterobacter  >> S to cefepime/Cipro/gent/meropenem 6July 07, 2024urine culture- ng  Antimicrobials:  Cefuroxime 6/1 (surg ppx)  vanc 6/1 Unasyn 6/4>> 6/12 Cefepime 6July 07, 2024>> Vancomycin  62024-07-07>> off  Interim history/subjective:  Anxious again. Still not lasting very long on PS due to muscle weakness. Down 1.7L with diuretic trial  Objective   Blood pressure 106/73, pulse 72, temperature 98.1 F (36.7 C), temperature source Axillary, resp. rate 18, height _0  (1.753 m), weight 62.3 kg, SpO2 95 %.    Vent Mode: PRVC FiO2 (%):  [30 %] 30 % Set Rate:  [24 bmp] 24 bmp Vt Set:  [560 mL] 560 mL PEEP:  [5 cmH20] 5 cmH20 Pressure Support:  [12 cmH20] 12 cmH20 Plateau Pressure:  [15 cmH20-18 cmH20] 15 cmH20   Intake/Output Summary (Last 24 hours) at 03/02/2020 0714 Last data filed at 03/02/2020 0500 Gross per 24 hour  Intake 1366.98 ml  Output 3114 ml  Net -1747.02 ml   Filed Weights   02/29/20 0500 03/01/20 0546 03/02/20 0514  Weight: 61.4 kg 63.4 kg 62.3 kg    Examination: GEN: frail elderly man in bed HEENT: trach in place, mostly oral secretions now CV: RRR, +SEM, ext warm PULM: Rhonci bilaterally, on PS, no accessory muscle use GI: Soft, NGT in place EXT: Muscle wasting NEURO: moves all 4 ext, weak PSYCH: mild anxiety, RASS 0 SKIN: sternotomy scar and central line scars healing,  Stable mild pancytopenia BUN/Cr up slightly with diuretic trial Bps better  Assessment & Plan:   Acute hypoxic and hypercarbic respiratory failure Aspiration-right lower lobe foreign body found during bronchoscopy, Candida tropicalis on BAL culture. Hx tobacco use Severe bullous emphysema present on chest x-ray imaging Severe  COPD by PFTs Tracheostomy 6/10 Pneumothorax on left, tube in place CT Chest on 6/15 with LUL collapse b/l airspace dz, rib fractures  -Continue PS and TC trials -Chest tube management per TCTS: potentially out today -Hold on diuretics today  Enterobacter HAP- completed 7 days cefepime and tobramycin  Persistent shock- continue midodrine at current dosing  Acute metabolic encephalopathy > improving Hx EtOH abuse Severe anxiety related to  illness - Continue oxycodone standing, citalopram, switch valium to clonazepam, add low dose seroquel - hydroxyzine to PRN given reported incomplete emptying  Bladder issues- sensation of incomplete emptying, better without hydroxyzine, UA and PVR benign  CABG 6/1 L CEA 6/1 - ASA + plavix   H/o HTN Afib Thrombocytopenia > stable Cachexia, frailty - continue PT , TFs - Palliative following  Stable for LTACH from my standpoint  Best practice:  Diet: Tube feeds Pain/Anxiety/Delirium protocol (if indicated): see above VAP protocol (if indicated): In place DVT prophylaxis: lovenox GI prophylaxis: protonix  Glucose control: SSI  Mobility: BR Code Status: partial  Family Communication: Per TCTS Disposition: ICU pending vent liberation    The patient is critically ill with multiple organ systems failure and requires high complexity decision making for assessment and support, frequent evaluation and titration of therapies, application of advanced monitoring technologies and extensive interpretation of multiple databases. Critical Care Time devoted to patient care services described in this note independent of APP/resident time (if applicable)  is 31 minutes.    Erskine Emery MD [facility] 03/02/2020 7:14 AM Personal pager: [phone] If unanswered, please page CCM On-call: [phone]

## 2020-03-03 LAB — BASIC METABOLIC PANEL
Anion gap: 11 (ref 5–15)
BUN: 50 mg/dL — ABNORMAL HIGH (ref 8–23)
CO2: 30 mmol/L (ref 22–32)
Calcium: 8.4 mg/dL — ABNORMAL LOW (ref 8.9–10.3)
Chloride: 97 mmol/L — ABNORMAL LOW (ref 98–111)
Creatinine, Ser: 0.9 mg/dL (ref 0.61–1.24)
GFR calc Af Amer: >60 mL/min (ref >60)
GFR calc non Af Amer: >60 mL/min (ref >60)
Glucose, Bld: 123 mg/dL — ABNORMAL HIGH (ref 70–99)
Potassium: 3.8 mmol/L (ref 3.5–5.1)
Sodium: 138 mmol/L (ref 135–145)

## 2020-03-03 LAB — GLUCOSE, CAPILLARY
Glucose-Capillary: 103 mg/dL — ABNORMAL HIGH (ref 70–99)
Glucose-Capillary: 115 mg/dL — ABNORMAL HIGH (ref 70–99)
Glucose-Capillary: 116 mg/dL — ABNORMAL HIGH (ref 70–99)
Glucose-Capillary: 126 mg/dL — ABNORMAL HIGH (ref 70–99)
Glucose-Capillary: 130 mg/dL — ABNORMAL HIGH (ref 70–99)
Glucose-Capillary: 149 mg/dL — ABNORMAL HIGH (ref 70–99)

## 2020-03-03 LAB — CBC
HCT: 27 % — ABNORMAL LOW (ref 39.0–52.0)
Hemoglobin: 8.3 g/dL — ABNORMAL LOW (ref 13.0–17.0)
MCH: 30.5 pg (ref 26.0–34.0)
MCHC: 30.7 g/dL (ref 30.0–36.0)
MCV: 99.3 fL (ref 80.0–100.0)
Platelets: 159 10*3/uL (ref 150–400)
RBC: 2.72 MIL/uL — ABNORMAL LOW (ref 4.22–5.81)
RDW: 20.1 % — ABNORMAL HIGH (ref 11.5–15.5)
WBC: 5 10*3/uL (ref 4.0–10.5)
nRBC: 0 % (ref 0.0–0.2)

## 2020-03-03 LAB — MAGNESIUM: Magnesium: 2.5 mg/dL — ABNORMAL HIGH (ref 1.7–2.4)

## 2020-03-03 MED ORDER — CHLORHEXIDINE GLUCONATE 0.12 % MT SOLN
OROMUCOSAL | Status: AC
  Filled 2020-03-03: qty 15

## 2020-03-03 MED ORDER — DOXAZOSIN MESYLATE 4 MG PO TABS
4.0000 mg | ORAL_TABLET | Freq: Every day | ORAL | Status: DC
  Administered 2020-03-03 – 2020-03-04 (×2): 4 mg via ORAL
  Filled 2020-03-03 (×3): qty 1

## 2020-03-03 MED ORDER — POTASSIUM CHLORIDE 20 MEQ/15ML (10%) PO SOLN: 40.0000 meq | Freq: Once | ORAL | Status: DC

## 2020-03-03 MED ORDER — POTASSIUM CHLORIDE 20 MEQ/15ML (10%) PO SOLN
40.0000 meq | Freq: Once | ORAL | Status: AC
  Administered 2020-03-03: 40 meq
  Filled 2020-03-03: qty 30

## 2020-03-03 NOTE — Progress Notes (Signed)
NAME:  Tony Munoz, MRN:  [id number], DOB:  1956/06/19, LOS: 64 ADMISSION DATE:  02/06/2020, CONSULTATION DATE:  02/08/20 REFERRING MD:  Orvan Seen - CVTS, CHIEF COMPLAINT:  SOB, hypoxia   Brief History   64 yo M with CAD, HTN, COPD admit for elective CABG and L CEA, with progressive respiratory failure with hypoxia and hypercarbia and intubated on POD 3. Self extubated 6/6 and required re-intubation February 25, 2023 Coded early morning of 2023-03-05, requiring CPR done for two rounds, 17m epinephrine given, 1 amp Bicarb. He was profoundly hypotensive at that point, so a push of epinephrine was given. Patient developed PEA a second time, achieving ROSC after 2 round of CPR, 2 mg Epi.   Past Medical History  EtOH use  CAD  Anxiety Tobacco use disorder HTN   Significant Hospital Events   6/1 CABG and L CEA  6/2 receiving BZD for possible EtOH withdrawals. Was on precedex  6/3 worsening respiratory status with worsening hypoxia. Received 40 mg lasix. PCCM consulted 6/5 intubated and bronch with removal of aspirated pill on bronchus intermedius 6/6 Self extubated  606-20-2024Worsening hypoxia with respiratory distress and WOB. Reintubated 6/10 tracheostomy  6/12-6/14 - PS weaning trials  62024/06/28PEA arrest 6/18 PS trial on vent support  6/19 weaned on pressure support 5/5 for 3 hours, very anxious  Consults:  Vascular surgery PCCM   Procedures:  6/1 CABG 6/1 L CEA  6/10 tracheostomy   Significant Diagnostic Tests:  Bronch 6/5: mucus plugs bilaterally, blue pill in BI   CXR 606/28/2024s/p code with improved aeration compared to prior CT Chest 606/28/24with partial left lung collapse, airspace disease c/w aspiration vs pna   Micro Data:  6/5 BAL-Candida tropicalis 6/9 blood culture - no growth 62024/06/28blood culture-ng 606/28/24trach aspirate-  enterobacter  >> S to cefepime/Cipro/gent/meropenem 628-Jun-2024urine culture- ng  Antimicrobials:  Cefuroxime 6/1 (surg ppx)  vanc 6/1 Unasyn 6/4>> 6/12 Cefepime 606/28/2024>> Vancomycin  606/28/2024>> off  Interim history/subjective:  Sleeping comfortably this morning.  Objective   Blood pressure 101/65, pulse 77, temperature 98.8 F (37.1 C), temperature source Oral, resp. rate (!) 24, height _0  (1.753 m), weight 57 kg, SpO2 96 %.    Vent Mode: PRVC FiO2 (%):  [30 %-35 %] 30 % Set Rate:  [24 bmp] 24 bmp Vt Set:  [560 mL] 560 mL PEEP:  [5 cmH20] 5 cmH20 Pressure Support:  [10 cmH20] 10 cmH20 Plateau Pressure:  [17 cmH20-18 cmH20] 18 cmH20   Intake/Output Summary (Last 24 hours) at 03/03/2020 0723 Last data filed at 03/03/2020 0700 Gross per 24 hour  Intake 2008.37 ml  Output 2475 ml  Net -466.63 ml   Filed Weights   03/01/20 0546 03/02/20 0514 03/03/20 0323  Weight: 63.4 kg 62.3 kg 57 kg    Examination: GEN: frail elderly man in bed HEENT: trach in place, secretion burden is mild CV: RRR, +SEM, ext warm PULM: Less rhonci today, on PS, no accessory muscle use GI: Soft, NGT in place EXT: Muscle wasting NEURO: moves all 4 ext, weak PSYCH: sleeping today SKIN: sternotomy scar and central line scars healing,  Stable mild pancytopenia BUN/Cr stable   Assessment & Plan:   Acute hypoxic and hypercarbic respiratory failure Aspiration-right lower lobe foreign body found during bronchoscopy, Hx tobacco use Severe bullous emphysema present on chest x-ray imaging Severe COPD by PFTs Tracheostomy 6/10 -Continue PS and TC trials -Hold on diuretics from my standpoint today  Enterobacter HAP- completed 7 days  cefepime and tobramycin  Persistent shock- continue midodrine at current dosing  Acute metabolic encephalopathy > improving Hx EtOH abuse Severe anxiety related to illness - Continue oxycodone standing, citalopram, clonazepam, seroquel - DC PRN hydroxyzine given bladder retention issues  Bladder retention- correlated with trial of hydroxyzine, finasteride started 6/25 - Hydroxyzine off, add cardura to finasteride  CABG 6/1 L CEA 6/1 - ASA + plavix    H/o HTN Afib Thrombocytopenia > stable Cachexia, frailty - continue PT , TFs - Palliative following  Stable for LTACH from my standpoint  Best practice:  Diet: Tube feeds Pain/Anxiety/Delirium protocol (if indicated): see above VAP protocol (if indicated): In place DVT prophylaxis: lovenox GI prophylaxis: protonix  Glucose control: SSI  Mobility: BR Code Status: partial  Family Communication: Per TCTS Disposition: ICU pending vent liberation    The patient is critically ill with multiple organ systems failure and requires high complexity decision making for assessment and support, frequent evaluation and titration of therapies, application of advanced monitoring technologies and extensive interpretation of multiple databases. Critical Care Time devoted to patient care services described in this note independent of APP/resident time (if applicable)  is 33 minutes.    Erskine Emery MD [facility] 03/03/2020 7:23 AM Personal pager: [phone] If unanswered, please page CCM On-call: [phone]

## 2020-03-03 NOTE — Progress Notes (Signed)
Patient has not voided since in and out cath therefore bladder scan performed and volume is 277 cc.

## 2020-03-03 NOTE — Progress Notes (Signed)
eLink Physician-Brief Progress Note Patient Name: Tony Munoz DOB: 05-Aug-1956 MRN: [id number]   Date of Service  03/03/2020  HPI/Events of Note  Oliguria - Bladder scan with 900 mL residual.   eICU Interventions  Plan: 1. I/O cath PRN.      Intervention Category Major Interventions: Other:  Fidel Caggiano Dennard Nip 03/03/2020, 2:57 AM

## 2020-03-03 NOTE — Progress Notes (Signed)
26 Days Post-Op Procedure(s) (LRB): CORONARY ARTERY BYPASS GRAFTING (CABG),  x Four with Bilateral IMAs, and right leg greater saphenous vein (N/A) TRANSESOPHAGEAL ECHOCARDIOGRAM (TEE) (N/A) INDOCYANINE GREEN FLUORESCENCE IMAGING (ICG) (N/A) CAROTID ENDARTERECTOMY LEFT with  Patch Angioplasty Using Hemashield Platinum Finesse patch (Left) Subjective: No complaints  Objective: Vital signs in last 24 hours: Temp:  [98.1 F (36.7 C)-98.8 F (37.1 C)] 98.7 F (37.1 C) (06/27 1114) Pulse Rate:  [69-101] 82 (06/27 1300) Cardiac Rhythm: Normal sinus rhythm (06/27 0800) Resp:  [17-27] 24 (06/27 1300) BP: (87-133)/(57-90) 94/60 (06/27 1300) SpO2:  [84 %-100 %] 98 % (06/27 1300) FiO2 (%):  [30 %-35 %] 30 % (06/27 1200) Weight:  [57 kg] 57 kg (06/27 0323)  Hemodynamic parameters for last 24 hours:    Intake/Output from previous day: 06/26 0701 - 06/27 0700 In: 2008.4 [I.V.:238.4; NG/GT:1770] Out: 2475 [Urine:2475] Intake/Output this shift: Total I/O In: 820 [I.V.:60; NG/GT:760] Out: 600 [Urine:600]  General appearance: alert and cooperative Neurologic: intact Heart: regular rate and rhythm, S1, S2 normal, no murmur, click, rub or gallop Lungs: diminished breath sounds bilaterally Extremities: extremities normal, atraumatic, no cyanosis or edema Wound: c.d.i  Lab Results: Recent Labs    03/02/20 0435 03/03/20 0423  WBC 5.5 5.0  HGB 8.8* 8.3*  HCT 28.0* 27.0*  PLT 144* 159   BMET:  Recent Labs    03/02/20 0435 03/03/20 0423  NA 138 138  K 3.9 3.8  CL 96* 97*  CO2 31 30  GLUCOSE 110* 123*  BUN 41* 50*  CREATININE 0.98 0.90  CALCIUM 8.4* 8.4*    PT/INR: No results for input(s): LABPROT, INR in the last 72 hours. ABG    Component Value Date/Time   PHART 7.538 (H) 02/26/2020 0317   HCO3 29.9 (H) 02/26/2020 0317   TCO2 31 02/26/2020 0317   ACIDBASEDEF 3.0 (H) 02/20/2020 0434   O2SAT 98.0 02/26/2020 0317   CBG (last 3)  Recent Labs    03/03/20 0310  03/03/20 0717 03/03/20 1116  GLUCAP 103* 116* 130*    Assessment/Plan: S/P Procedure(s) (LRB): CORONARY ARTERY BYPASS GRAFTING (CABG),  x Four with Bilateral IMAs, and right leg greater saphenous vein (N/A) TRANSESOPHAGEAL ECHOCARDIOGRAM (TEE) (N/A) INDOCYANINE GREEN FLUORESCENCE IMAGING (ICG) (N/A) CAROTID ENDARTERECTOMY LEFT with  Patch Angioplasty Using Hemashield Platinum Finesse patch (Left) Mobilize Diuresis Vent vean Nutritional support  LOS: 26 days    [name] 03/03/2020

## 2020-03-03 NOTE — Progress Notes (Signed)
Patient had voided 225 cc into external catheter from 2130 to 0130 and during bath RN noticed that patient's lower abdomen was very distended and firm. Bladder scan result was > 999 therefore contacted [facility] and in and out cath performed. 1050 cc out from in and out catheter.

## 2020-03-04 ENCOUNTER — Ambulatory Visit: Payer: Self-pay | Admitting: Cardiothoracic Surgery

## 2020-03-04 ENCOUNTER — Inpatient Hospital Stay
Admission: RE | Admit: 2020-03-04 | Discharge: 2020-05-31 | Disposition: A | Discharge status: Skilled Nursing Facility | Payer: Medicare HMO | Source: Other Acute Inpatient Hospital

## 2020-03-04 ENCOUNTER — Other Ambulatory Visit (HOSPITAL_COMMUNITY): Payer: Medicare HMO

## 2020-03-04 DIAGNOSIS — R579 Shock, unspecified: Secondary | ICD-10-CM | POA: Diagnosis not present

## 2020-03-04 DIAGNOSIS — I2510 Atherosclerotic heart disease of native coronary artery without angina pectoris: Secondary | ICD-10-CM | POA: Diagnosis present

## 2020-03-04 DIAGNOSIS — Z931 Gastrostomy status: Secondary | ICD-10-CM | POA: Diagnosis not present

## 2020-03-04 DIAGNOSIS — M6250 Muscle wasting and atrophy, not elsewhere classified, unspecified site: Secondary | ICD-10-CM | POA: Diagnosis not present

## 2020-03-04 DIAGNOSIS — R1310 Dysphagia, unspecified: Secondary | ICD-10-CM | POA: Diagnosis not present

## 2020-03-04 DIAGNOSIS — R338 Other retention of urine: Secondary | ICD-10-CM | POA: Diagnosis not present

## 2020-03-04 DIAGNOSIS — J948 Other specified pleural conditions: Secondary | ICD-10-CM | POA: Diagnosis not present

## 2020-03-04 DIAGNOSIS — Z0189 Encounter for other specified special examinations: Secondary | ICD-10-CM

## 2020-03-04 DIAGNOSIS — Z4659 Encounter for fitting and adjustment of other gastrointestinal appliance and device: Secondary | ICD-10-CM

## 2020-03-04 DIAGNOSIS — J189 Pneumonia, unspecified organism: Secondary | ICD-10-CM | POA: Diagnosis not present

## 2020-03-04 DIAGNOSIS — Z743 Need for continuous supervision: Secondary | ICD-10-CM | POA: Diagnosis not present

## 2020-03-04 DIAGNOSIS — I25119 Atherosclerotic heart disease of native coronary artery with unspecified angina pectoris: Secondary | ICD-10-CM | POA: Diagnosis not present

## 2020-03-04 DIAGNOSIS — G8929 Other chronic pain: Secondary | ICD-10-CM | POA: Diagnosis not present

## 2020-03-04 DIAGNOSIS — R918 Other nonspecific abnormal finding of lung field: Secondary | ICD-10-CM | POA: Diagnosis not present

## 2020-03-04 DIAGNOSIS — F1120 Opioid dependence, uncomplicated: Secondary | ICD-10-CM | POA: Diagnosis not present

## 2020-03-04 DIAGNOSIS — J151 Pneumonia due to Pseudomonas: Secondary | ICD-10-CM | POA: Diagnosis not present

## 2020-03-04 DIAGNOSIS — J9602 Acute respiratory failure with hypercapnia: Secondary | ICD-10-CM | POA: Diagnosis not present

## 2020-03-04 DIAGNOSIS — L89159 Pressure ulcer of sacral region, unspecified stage: Secondary | ICD-10-CM | POA: Diagnosis not present

## 2020-03-04 DIAGNOSIS — N319 Neuromuscular dysfunction of bladder, unspecified: Secondary | ICD-10-CM | POA: Diagnosis not present

## 2020-03-04 DIAGNOSIS — J449 Chronic obstructive pulmonary disease, unspecified: Secondary | ICD-10-CM | POA: Diagnosis not present

## 2020-03-04 DIAGNOSIS — I469 Cardiac arrest, cause unspecified: Secondary | ICD-10-CM | POA: Diagnosis not present

## 2020-03-04 DIAGNOSIS — I25810 Atherosclerosis of coronary artery bypass graft(s) without angina pectoris: Secondary | ICD-10-CM | POA: Diagnosis not present

## 2020-03-04 DIAGNOSIS — R161 Splenomegaly, not elsewhere classified: Secondary | ICD-10-CM | POA: Diagnosis not present

## 2020-03-04 DIAGNOSIS — J439 Emphysema, unspecified: Secondary | ICD-10-CM | POA: Diagnosis not present

## 2020-03-04 DIAGNOSIS — J9690 Respiratory failure, unspecified, unspecified whether with hypoxia or hypercapnia: Secondary | ICD-10-CM

## 2020-03-04 DIAGNOSIS — Z431 Encounter for attention to gastrostomy: Secondary | ICD-10-CM

## 2020-03-04 DIAGNOSIS — T17500A Unspecified foreign body in bronchus causing asphyxiation, initial encounter: Secondary | ICD-10-CM | POA: Diagnosis not present

## 2020-03-04 DIAGNOSIS — Z01818 Encounter for other preprocedural examination: Secondary | ICD-10-CM | POA: Diagnosis not present

## 2020-03-04 DIAGNOSIS — J9601 Acute respiratory failure with hypoxia: Secondary | ICD-10-CM | POA: Diagnosis not present

## 2020-03-04 DIAGNOSIS — J156 Pneumonia due to other aerobic Gram-negative bacteria: Secondary | ICD-10-CM | POA: Diagnosis not present

## 2020-03-04 DIAGNOSIS — Z9911 Dependence on respirator [ventilator] status: Secondary | ICD-10-CM

## 2020-03-04 DIAGNOSIS — I4891 Unspecified atrial fibrillation: Secondary | ICD-10-CM | POA: Diagnosis present

## 2020-03-04 DIAGNOSIS — L89154 Pressure ulcer of sacral region, stage 4: Secondary | ICD-10-CM | POA: Diagnosis not present

## 2020-03-04 DIAGNOSIS — J90 Pleural effusion, not elsewhere classified: Secondary | ICD-10-CM

## 2020-03-04 DIAGNOSIS — G894 Chronic pain syndrome: Secondary | ICD-10-CM | POA: Diagnosis not present

## 2020-03-04 DIAGNOSIS — S2242XD Multiple fractures of ribs, left side, subsequent encounter for fracture with routine healing: Secondary | ICD-10-CM | POA: Diagnosis not present

## 2020-03-04 DIAGNOSIS — J9691 Respiratory failure, unspecified with hypoxia: Secondary | ICD-10-CM | POA: Diagnosis not present

## 2020-03-04 DIAGNOSIS — E43 Unspecified severe protein-calorie malnutrition: Secondary | ICD-10-CM | POA: Diagnosis not present

## 2020-03-04 DIAGNOSIS — I482 Chronic atrial fibrillation, unspecified: Secondary | ICD-10-CM | POA: Diagnosis not present

## 2020-03-04 DIAGNOSIS — J690 Pneumonitis due to inhalation of food and vomit: Secondary | ICD-10-CM | POA: Diagnosis not present

## 2020-03-04 DIAGNOSIS — J9621 Acute and chronic respiratory failure with hypoxia: Secondary | ICD-10-CM | POA: Diagnosis not present

## 2020-03-04 DIAGNOSIS — R5381 Other malaise: Secondary | ICD-10-CM | POA: Diagnosis not present

## 2020-03-04 DIAGNOSIS — J9819 Other pulmonary collapse: Secondary | ICD-10-CM | POA: Diagnosis not present

## 2020-03-04 DIAGNOSIS — Z9889 Other specified postprocedural states: Secondary | ICD-10-CM

## 2020-03-04 DIAGNOSIS — R0902 Hypoxemia: Secondary | ICD-10-CM | POA: Diagnosis not present

## 2020-03-04 DIAGNOSIS — G9340 Encephalopathy, unspecified: Secondary | ICD-10-CM | POA: Diagnosis not present

## 2020-03-04 DIAGNOSIS — D509 Iron deficiency anemia, unspecified: Secondary | ICD-10-CM | POA: Diagnosis not present

## 2020-03-04 DIAGNOSIS — I96 Gangrene, not elsewhere classified: Secondary | ICD-10-CM | POA: Diagnosis not present

## 2020-03-04 DIAGNOSIS — R627 Adult failure to thrive: Secondary | ICD-10-CM | POA: Diagnosis not present

## 2020-03-04 DIAGNOSIS — J99 Respiratory disorders in diseases classified elsewhere: Secondary | ICD-10-CM | POA: Diagnosis not present

## 2020-03-04 DIAGNOSIS — G9341 Metabolic encephalopathy: Secondary | ICD-10-CM | POA: Diagnosis not present

## 2020-03-04 DIAGNOSIS — Z8709 Personal history of other diseases of the respiratory system: Secondary | ICD-10-CM | POA: Diagnosis not present

## 2020-03-04 DIAGNOSIS — J9811 Atelectasis: Secondary | ICD-10-CM | POA: Diagnosis not present

## 2020-03-04 DIAGNOSIS — F419 Anxiety disorder, unspecified: Secondary | ICD-10-CM | POA: Diagnosis not present

## 2020-03-04 DIAGNOSIS — L89150 Pressure ulcer of sacral region, unstageable: Secondary | ICD-10-CM | POA: Diagnosis not present

## 2020-03-04 DIAGNOSIS — R609 Edema, unspecified: Secondary | ICD-10-CM

## 2020-03-04 DIAGNOSIS — Z4682 Encounter for fitting and adjustment of non-vascular catheter: Secondary | ICD-10-CM | POA: Diagnosis not present

## 2020-03-04 DIAGNOSIS — J181 Lobar pneumonia, unspecified organism: Secondary | ICD-10-CM | POA: Diagnosis not present

## 2020-03-04 HISTORY — DX: Chronic obstructive pulmonary disease, unspecified: J44.9

## 2020-03-04 HISTORY — DX: Unspecified atrial fibrillation: I48.91

## 2020-03-04 HISTORY — DX: Cardiac arrest, cause unspecified: I46.9

## 2020-03-04 HISTORY — DX: Acute and chronic respiratory failure with hypoxia: J96.21

## 2020-03-04 HISTORY — DX: Atherosclerotic heart disease of native coronary artery without angina pectoris: I25.10

## 2020-03-04 LAB — CBC
HCT: 26.5 % — ABNORMAL LOW (ref 39.0–52.0)
Hemoglobin: 8 g/dL — ABNORMAL LOW (ref 13.0–17.0)
MCH: 30.3 pg (ref 26.0–34.0)
MCHC: 30.2 g/dL (ref 30.0–36.0)
MCV: 100.4 fL — ABNORMAL HIGH (ref 80.0–100.0)
Platelets: 166 10*3/uL (ref 150–400)
RBC: 2.64 MIL/uL — ABNORMAL LOW (ref 4.22–5.81)
RDW: 19.8 % — ABNORMAL HIGH (ref 11.5–15.5)
WBC: 5.7 10*3/uL (ref 4.0–10.5)
nRBC: 0 % (ref 0.0–0.2)

## 2020-03-04 LAB — BLOOD GAS, ARTERIAL
Acid-Base Excess: 7.2 mmol/L — ABNORMAL HIGH (ref 0.0–2.0)
Bicarbonate: 30.4 mmol/L — ABNORMAL HIGH (ref 20.0–28.0)
FIO2: 30
O2 Saturation: 99.1 %
Patient temperature: 36.9
pCO2 arterial: 36.9 mmHg (ref 32.0–48.0)
pH, Arterial: 7.527 — ABNORMAL HIGH (ref 7.350–7.450)
pO2, Arterial: 116 mmHg — ABNORMAL HIGH (ref 83.0–108.0)

## 2020-03-04 LAB — GLUCOSE, CAPILLARY
Glucose-Capillary: 144 mg/dL — ABNORMAL HIGH (ref 70–99)
Glucose-Capillary: 135 mg/dL — ABNORMAL HIGH (ref 70–99)
Glucose-Capillary: 144 mg/dL — ABNORMAL HIGH (ref 70–99)

## 2020-03-04 LAB — BASIC METABOLIC PANEL
Anion gap: 9 (ref 5–15)
BUN: 51 mg/dL — ABNORMAL HIGH (ref 8–23)
CO2: 28 mmol/L (ref 22–32)
Calcium: 8.3 mg/dL — ABNORMAL LOW (ref 8.9–10.3)
Chloride: 101 mmol/L (ref 98–111)
Creatinine, Ser: 0.81 mg/dL (ref 0.61–1.24)
GFR calc Af Amer: >60 mL/min (ref >60)
GFR calc non Af Amer: >60 mL/min (ref >60)
Glucose, Bld: 136 mg/dL — ABNORMAL HIGH (ref 70–99)
Potassium: 4 mmol/L (ref 3.5–5.1)
Sodium: 138 mmol/L (ref 135–145)

## 2020-03-04 LAB — MAGNESIUM: Magnesium: 2.4 mg/dL (ref 1.7–2.4)

## 2020-03-04 MED ORDER — DOCUSATE SODIUM 50 MG/5ML PO LIQD: 200.0000 mg | Freq: Every day | ORAL | 0 refills | Status: AC

## 2020-03-04 MED ORDER — ADULT MULTIVITAMIN LIQUID CH: 15.0000 mL | Freq: Every day | ORAL | Status: AC

## 2020-03-04 MED ORDER — JUVEN PO PACK: 1.0000 | PACK | Freq: Two times a day (BID) | ORAL | 0 refills | Status: AC

## 2020-03-04 MED ORDER — BUDESONIDE 0.25 MG/2ML IN SUSP: 0.2500 mg | Freq: Two times a day (BID) | RESPIRATORY_TRACT | 12 refills | Status: AC

## 2020-03-04 MED ORDER — CLONAZEPAM 2 MG PO TABS: 2.0000 mg | ORAL_TABLET | Freq: Two times a day (BID) | ORAL | 0 refills | Status: AC

## 2020-03-04 MED ORDER — THIAMINE HCL 100 MG PO TABS: 100.0000 mg | ORAL_TABLET | Freq: Every day | ORAL | Status: AC

## 2020-03-04 MED ORDER — MELATONIN 5 MG PO TABS: 5.0000 mg | ORAL_TABLET | Freq: Every evening | ORAL | 0 refills | Status: AC | PRN

## 2020-03-04 MED ORDER — ENOXAPARIN SODIUM 40 MG/0.4ML ~~LOC~~ SOLN: 40.0000 mg | Freq: Every day | SUBCUTANEOUS | Status: AC

## 2020-03-04 MED ORDER — MIDODRINE HCL 5 MG PO TABS: 5.0000 mg | ORAL_TABLET | Freq: Three times a day (TID) | ORAL | Status: AC

## 2020-03-04 MED ORDER — AMIODARONE HCL 200 MG PO TABS: 200.0000 mg | ORAL_TABLET | Freq: Two times a day (BID) | ORAL | Status: AC

## 2020-03-04 MED ORDER — FERROUS SULFATE 300 (60 FE) MG/5ML PO SYRP: 300.0000 mg | ORAL_SOLUTION | Freq: Two times a day (BID) | ORAL | 3 refills | Status: AC

## 2020-03-04 MED ORDER — ONDANSETRON HCL 4 MG/2ML IJ SOLN: 4.0000 mg | Freq: Four times a day (QID) | INTRAMUSCULAR | 0 refills | Status: AC | PRN

## 2020-03-04 MED ORDER — REVEFENACIN 175 MCG/3ML IN SOLN: 175.0000 ug | Freq: Every day | RESPIRATORY_TRACT | Status: AC

## 2020-03-04 MED ORDER — VITAL AF 1.2 CAL PO LIQD: 1000.0000 mL | ORAL | Status: AC

## 2020-03-04 MED ORDER — ARFORMOTEROL TARTRATE 15 MCG/2ML IN NEBU: 15.0000 ug | INHALATION_SOLUTION | Freq: Two times a day (BID) | RESPIRATORY_TRACT | Status: AC

## 2020-03-04 MED ORDER — ALUM & MAG HYDROXIDE-SIMETH 200-200-20 MG/5ML PO SUSP: 15.0000 mL | Freq: Four times a day (QID) | ORAL | 0 refills | Status: AC | PRN

## 2020-03-04 MED ORDER — ATORVASTATIN CALCIUM 10 MG PO TABS: 10.0000 mg | ORAL_TABLET | Freq: Every day | ORAL | Status: AC

## 2020-03-04 MED ORDER — ASPIRIN 81 MG PO CHEW: 81.0000 mg | CHEWABLE_TABLET | Freq: Every day | ORAL | Status: AC

## 2020-03-04 MED ORDER — SODIUM CHLORIDE 0.9% FLUSH: 10.0000 mL | INTRAVENOUS | Status: AC | PRN

## 2020-03-04 MED ORDER — OXYCODONE HCL 5 MG/5ML PO SOLN: 5.0000 mg | ORAL | 0 refills | Status: AC | PRN

## 2020-03-04 MED ORDER — COLLAGENASE 250 UNIT/GM EX OINT: TOPICAL_OINTMENT | Freq: Every day | CUTANEOUS | 0 refills | Status: AC

## 2020-03-04 MED ORDER — FOLIC ACID 1 MG PO TABS: 1.0000 mg | ORAL_TABLET | Freq: Every day | ORAL | Status: AC

## 2020-03-04 MED ORDER — DOXAZOSIN MESYLATE 4 MG PO TABS: 4.0000 mg | ORAL_TABLET | Freq: Every day | ORAL | Status: AC

## 2020-03-04 MED ORDER — SODIUM CHLORIDE 0.9% FLUSH: 10.0000 mL | Freq: Two times a day (BID) | INTRAVENOUS | Status: AC

## 2020-03-04 MED ORDER — CLOPIDOGREL BISULFATE 75 MG PO TABS: 75.0000 mg | ORAL_TABLET | Freq: Every day | ORAL | Status: AC

## 2020-03-04 MED ORDER — QUETIAPINE FUMARATE 25 MG PO TABS: 25.0000 mg | ORAL_TABLET | Freq: Two times a day (BID) | ORAL | Status: AC

## 2020-03-04 MED ORDER — FINASTERIDE 5 MG PO TABS: 5.0000 mg | ORAL_TABLET | Freq: Every day | ORAL | Status: AC

## 2020-03-04 MED ORDER — INSULIN ASPART 100 UNIT/ML ~~LOC~~ SOLN: 0.0000 [IU] | SUBCUTANEOUS | 11 refills | Status: AC

## 2020-03-04 MED ORDER — PANTOPRAZOLE SODIUM 40 MG PO PACK: 40.0000 mg | PACK | Freq: Every day | ORAL | Status: AC

## 2020-03-04 MED ORDER — CITALOPRAM HYDROBROMIDE 20 MG PO TABS: 20.0000 mg | ORAL_TABLET | Freq: Every day | ORAL | Status: AC

## 2020-03-04 NOTE — TOC Transition Note (Signed)
Transition of Care [facility]) - CM/SW Discharge Note   Patient Details  Name: Tony Munoz MRN: [id number] Date of Birth: March 15, 1956  [facility]) CM/SW Contact:  Tony Bridgeman, RN Phone Number: 03/04/2020, 11:59 AM   Clinical Narrative:  Tony Munoz, RNCM with [facility] called and stated that the patient was approved and has bed availability at [facility] to be able to transfer today.  Once the patient has a discharge summary completed - please call report to Room 5E 25 - [phone].  The physician caring for the patient at [facility] will be Dr. Marlana Munoz.  Final next level of care: Long Term Acute Care (LTAC) Barriers to Discharge: Continued Medical Work up   Patient Goals and CMS Choice Patient states their goals for this hospitalization and ongoing recovery are:: Family Tony Munoz, nephew) and patient are in agreement for LTAC placement at [facility]. [facility] and 2. [facility]. CMS Medicare.gov Compare Post Acute Care list provided to:: Patient Represenative (must comment) (presented to nephew, Tony Munoz - patient sedated at the time of assessment) Choice offered to / list presented to : [facility] POA / Guardian  Discharge Placement                       Discharge Plan and Services   Discharge Planning Services: CM Consult Post Acute Care Choice: Long Term Acute Care (LTAC)                               Social Determinants of Health (SDOH) Interventions     Readmission Risk Interventions Readmission Risk Prevention Plan 02/29/2020  Transportation Screening Complete  PCP or Specialist Appt within 3-5 Days Complete  HRI or Home Care Consult Complete  Social Work Consult for Recovery Care Planning/Counseling Complete  Palliative Care Screening Complete  Medication Review Oceanographer) Complete

## 2020-03-04 NOTE — Progress Notes (Signed)
[facility] bagged to [facility], no issues. Handed off care to [facility].

## 2020-03-04 NOTE — Progress Notes (Addendum)
NAME:  Tony Munoz, MRN:  [id number], DOB:  June 26, 1956, LOS: 77 ADMISSION DATE:  02/06/2020, CONSULTATION DATE:  02/08/20 REFERRING MD:  Orvan Seen - CVTS, CHIEF COMPLAINT:  SOB, hypoxia   Brief History   64 yo M with CAD, HTN, COPD admit for elective CABG and L CEA, with progressive respiratory failure with hypoxia and hypercarbia and intubated on POD 3. Self extubated 6/6 and required re-intubation February 16, 2023 Coded early morning of Feb 24, 2023, requiring CPR done for two rounds, 21m epinephrine given, 1 amp Bicarb. He was profoundly hypotensive at that point, so a push of epinephrine was given. Patient developed PEA a second time, achieving ROSC after 2 round of CPR, 2 mg Epi.   Past Medical History  EtOH use  CAD  Anxiety Tobacco use disorder HTN   Significant Hospital Events   6/1 CABG and L CEA  6/2 receiving BZD for possible EtOH withdrawals. Was on precedex  6/3 worsening respiratory status with worsening hypoxia. Received 40 mg lasix. PCCM consulted 6/5 intubated and bronch with removal of aspirated pill on bronchus intermedius 6/6 Self extubated  606-11-24Worsening hypoxia with respiratory distress and WOB. Reintubated 6/10 tracheostomy  6/12-6/14 - PS weaning trials  606/19/24PEA arrest 6/18 PS trial on vent support  6/19 weaned on pressure support 5/5 for 3 hours, very anxious  Consults:  Vascular surgery PCCM   Procedures:  6/1 CABG 6/1 L CEA  6/10 tracheostomy   Significant Diagnostic Tests:  Bronch 6/5: mucus plugs bilaterally, blue pill in BI    CT Chest 606-19-2024 1. Left upper lobe collapse and bilateral airspace disease suggestive of pneumonia or aspiration. 2. Left third through fifth rib fractures. 3. Small left pneumothorax. 4. Bullous emphysema CTH 62024/06/19 1. No convincing change from head CT 02/01/2020. 2. Chronic ischemic injury Micro Data:  6/5 BAL-Candida tropicalis 6/9 blood culture - no growth 606/19/2024blood culture-ng 62024-06-19trach aspirate-  enterobacter  >> S to  cefepime/Cipro/gent/meropenem 619-Jun-2024urine culture- ng  Antimicrobials:  Cefuroxime 6/1 (surg ppx)  vanc 6/1 Unasyn 6/4>> 6/11 Cefepime 619-Jun-2024>>6/22 Vancomycin 619-Jun-2024>> 6/16 Tobramycin 6/15->6/21  Interim history/subjective:  6/28: awake and following commands. Weak cough this am with wet upper airway sounds. Bloody trach secretions present.  6/27:Sleeping comfortably this morning.  Objective   Blood pressure 96/64, pulse 77, temperature 98 F (36.7 C), temperature source Oral, resp. rate (!) 24, height _0  (1.753 m), weight 59.3 kg, SpO2 97 %.    Vent Mode: PRVC FiO2 (%):  [30 %-40 %] 40 % Set Rate:  [24 bmp] 24 bmp Vt Set:  [560 mL] 560 mL PEEP:  [5 cmH20] 5 cmH20 Plateau Pressure:  [18 cmH20-25 cmH20] 25 cmH20   Intake/Output Summary (Last 24 hours) at 03/04/2020 0736 Last data filed at 03/04/2020 0400 Gross per 24 hour  Intake 1514.95 ml  Output 1500 ml  Net 14.95 ml   Filed Weights   03/02/20 0514 03/03/20 0323 03/04/20 0302  Weight: 62.3 kg 57 kg 59.3 kg    Examination: GEN: frail elderly man in bed HEENT: trach in place, secretion burden is mild CV: RRR, +SEM, ext warm PULM: Less rhonci today, on PS, no accessory muscle use GI: Soft, NGT in place EXT: Muscle wasting NEURO: moves all 4 ext, weak PSYCH: sleeping today SKIN: sternotomy scar and central line scars healing,  Lab Results  Component Value Date   WBC 5.7 03/04/2020   HGB 8.0 (L) 03/04/2020   HCT 26.5 (L) 03/04/2020   MCV 100.4 (  H) 03/04/2020   PLT 166 03/04/2020   Lab Results  Component Value Date   CREATININE 0.81 03/04/2020   BUN 51 (H) 03/04/2020   NA 138 03/04/2020   K 4.0 03/04/2020   CL 101 03/04/2020   CO2 28 03/04/2020    Assessment & Plan:   Acute hypoxic and hypercarbic respiratory failure Aspiration-right lower lobe foreign body found during bronchoscopy, Hx tobacco use Severe bullous emphysema present on chest x-ray imaging Severe COPD by PFTs Tracheostomy  6/10 -Continue PS and TC trials (only tolerating 3 hours today) -Hold on diuretics from my standpoint today  Enterobacter HAP- completed 7 days cefepime and tobramycin  Persistent shock- continue midodrine at current dosing  Acute metabolic encephalopathy > improving Hx EtOH abuse Severe anxiety related to illness - Continue oxycodone standing, citalopram, clonazepam, seroquel  Bladder retention- correlated with trial of hydroxyzine, finasteride started 6/25 - Hydroxyzine off -cont cardura with finasteride -replace foley  CABG 6/1 L CEA 6/1 - ASA + plavix  -statin -per CTS  H/o HTN Afib -amio po  Thrombocytopenia:  improved Normocytic anemia:  -hgb relatively stable around 8 -no acute indication for transfusion  Hyperglycemia:  -a1c ok at 5.2 -monitor  Cachexia, frailty - continue PT , TFs - Palliative following Stable for LTACH from my standpoint  Best practice:  Diet: Tube feeds Pain/Anxiety/Delirium protocol (if indicated): see above VAP protocol (if indicated): In place DVT prophylaxis: lovenox GI prophylaxis: protonix  Glucose control: SSI  Mobility: BR Code Status: partial  Family Communication: Per TCTS Disposition: ICU pending vent liberation. Remains stable for LTACH   Critical care time: The patient is critically ill with multiple organ systems failure and requires high complexity decision making for assessment and support, frequent evaluation and titration of therapies, application of advanced monitoring technologies and extensive interpretation of multiple databases.  Critical care time 37 mins. This represents my time independent of the NPs time taking care of the pt. This is excluding procedures.    [facility] 03/04/2020, 7:36 AM

## 2020-03-04 NOTE — Consult Note (Signed)
WOC Nurse wound follow up Wound type: Unstageable Pressure Injury Measurement: 3.5cm x 3.0cm x 0.3cm  Wound bed:50% black/50% pink  Drainage (amount, consistency, odor) minimal, no odor Periwound: intact; wound edges slightly macerated Dressing procedure/placement/frequency: Continue enzymatic debridement ointment, saline moist gauze dressing. Change daily.   WOC Nurse team will follow with you and see patient within 10 days for wound assessments.  Please notify WOC nurses of any acute changes in the wounds or any new areas of concern  Ferris Tally [facility] MSN, RN,CWOCN, CNS, CWON-AP [phone]

## 2020-03-04 NOTE — Progress Notes (Signed)
27 Days Post-Op Procedure(s) (LRB): CORONARY ARTERY BYPASS GRAFTING (CABG),  x Four with Bilateral IMAs, and right leg greater saphenous vein (N/A) TRANSESOPHAGEAL ECHOCARDIOGRAM (TEE) (N/A) INDOCYANINE GREEN FLUORESCENCE IMAGING (ICG) (N/A) CAROTID ENDARTERECTOMY LEFT with  Patch Angioplasty Using Hemashield Platinum Finesse patch (Left) Subjective: No complaints  Objective: Vital signs in last 24 hours: Temp:  [97.9 F (36.6 C)-98.7 F (37.1 C)] 98 F (36.7 C) (06/28 0400) Pulse Rate:  [65-100] 77 (06/28 0700) Cardiac Rhythm: Normal sinus rhythm (06/28 0400) Resp:  [17-33] 24 (06/28 0700) BP: (94-124)/(59-79) 96/64 (06/28 0700) SpO2:  [86 %-100 %] 97 % (06/28 0700) FiO2 (%):  [30 %-40 %] 40 % (06/28 0415) Weight:  [59.3 kg] 59.3 kg (06/28 0302)  Hemodynamic parameters for last 24 hours:    Intake/Output from previous day: 06/27 0701 - 06/28 0700 In: 1515 [I.V.:210; NG/GT:1305] Out: 1500 [Urine:1500] Intake/Output this shift: No intake/output data recorded.  General appearance: alert and cooperative Neurologic: intact Heart: regular rate and rhythm, S1, S2 normal, no murmur, click, rub or gallop Lungs: diminished breath sounds bilaterally Abdomen: soft, non-tender; bowel sounds normal; no masses,  no organomegaly Extremities: extremities normal, atraumatic, no cyanosis or edema Wound: c/d/i  Lab Results: Recent Labs    03/03/20 0423 03/04/20 0321  WBC 5.0 5.7  HGB 8.3* 8.0*  HCT 27.0* 26.5*  PLT 159 166   BMET:  Recent Labs    03/03/20 0423 03/04/20 0321  NA 138 138  K 3.8 4.0  CL 97* 101  CO2 30 28  GLUCOSE 123* 136*  BUN 50* 51*  CREATININE 0.90 0.81  CALCIUM 8.4* 8.3*    PT/INR: No results for input(s): LABPROT, INR in the last 72 hours. ABG    Component Value Date/Time   PHART 7.538 (H) 02/26/2020 0317   HCO3 29.9 (H) 02/26/2020 0317   TCO2 31 02/26/2020 0317   ACIDBASEDEF 3.0 (H) 02/20/2020 0434   O2SAT 98.0 02/26/2020 0317   CBG (last  3)  Recent Labs    03/03/20 1942 03/03/20 2352 03/04/20 0254  GLUCAP 115* 126* 144*    Assessment/Plan: S/P Procedure(s) (LRB): CORONARY ARTERY BYPASS GRAFTING (CABG),  x Four with Bilateral IMAs, and right leg greater saphenous vein (N/A) TRANSESOPHAGEAL ECHOCARDIOGRAM (TEE) (N/A) INDOCYANINE GREEN FLUORESCENCE IMAGING (ICG) (N/A) CAROTID ENDARTERECTOMY LEFT with  Patch Angioplasty Using Hemashield Platinum Finesse patch (Left) Urge LTAC transfer  Question whether he needs more long term feeding access prior to txfer   LOS: 27 days    [name] 03/04/2020

## 2020-03-05 ENCOUNTER — Other Ambulatory Visit (HOSPITAL_COMMUNITY): Payer: Medicare HMO

## 2020-03-05 LAB — COMPREHENSIVE METABOLIC PANEL
ALT: 61 U/L — ABNORMAL HIGH (ref 0–44)
AST: 41 U/L (ref 15–41)
Albumin: 2.1 g/dL — ABNORMAL LOW (ref 3.5–5.0)
Alkaline Phosphatase: 141 U/L — ABNORMAL HIGH (ref 38–126)
Anion gap: 10 (ref 5–15)
BUN: 48 mg/dL — ABNORMAL HIGH (ref 8–23)
CO2: 29 mmol/L (ref 22–32)
Calcium: 8.4 mg/dL — ABNORMAL LOW (ref 8.9–10.3)
Chloride: 100 mmol/L (ref 98–111)
Creatinine, Ser: 0.87 mg/dL (ref 0.61–1.24)
GFR calc Af Amer: >60 mL/min (ref >60)
GFR calc non Af Amer: >60 mL/min (ref >60)
Glucose, Bld: 125 mg/dL — ABNORMAL HIGH (ref 70–99)
Potassium: 3.9 mmol/L (ref 3.5–5.1)
Sodium: 139 mmol/L (ref 135–145)
Total Bilirubin: 0.7 mg/dL (ref 0.3–1.2)
Total Protein: 5.5 g/dL — ABNORMAL LOW (ref 6.5–8.1)

## 2020-03-05 LAB — OCCULT BLOOD X 1 CARD TO LAB, STOOL
Fecal Occult Bld: NEGATIVE
Fecal Occult Bld: NEGATIVE

## 2020-03-05 LAB — C DIFFICILE QUICK SCREEN W PCR REFLEX
C Diff antigen: NEGATIVE
C Diff interpretation: NOT DETECTED
C Diff toxin: NEGATIVE

## 2020-03-05 LAB — CBC WITH DIFFERENTIAL/PLATELET
Abs Immature Granulocytes: 0.02 10*3/uL (ref 0.00–0.07)
Basophils Absolute: 0 10*3/uL (ref 0.0–0.1)
Basophils Relative: 1 %
Eosinophils Absolute: 0.1 10*3/uL (ref 0.0–0.5)
Eosinophils Relative: 2 %
HCT: 26 % — ABNORMAL LOW (ref 39.0–52.0)
Hemoglobin: 8 g/dL — ABNORMAL LOW (ref 13.0–17.0)
Immature Granulocytes: 1 %
Lymphocytes Relative: 20 %
Lymphs Abs: 0.9 10*3/uL (ref 0.7–4.0)
MCH: 30.3 pg (ref 26.0–34.0)
MCHC: 30.8 g/dL (ref 30.0–36.0)
MCV: 98.5 fL (ref 80.0–100.0)
Monocytes Absolute: 0.4 10*3/uL (ref 0.1–1.0)
Monocytes Relative: 9 %
Neutro Abs: 3 10*3/uL (ref 1.7–7.7)
Neutrophils Relative %: 67 %
Platelets: 185 10*3/uL (ref 150–400)
RBC: 2.64 MIL/uL — ABNORMAL LOW (ref 4.22–5.81)
RDW: 19.5 % — ABNORMAL HIGH (ref 11.5–15.5)
WBC: 4.3 10*3/uL (ref 4.0–10.5)
nRBC: 0 % (ref 0.0–0.2)

## 2020-03-05 LAB — PROTIME-INR
INR: 1.2 (ref 0.8–1.2)
Prothrombin Time: 14.4 seconds (ref 11.4–15.2)

## 2020-03-05 LAB — MAGNESIUM: Magnesium: 2.4 mg/dL (ref 1.7–2.4)

## 2020-03-05 LAB — PHOSPHORUS: Phosphorus: 3.5 mg/dL (ref 2.5–4.6)

## 2020-03-05 LAB — TSH: TSH: 11.131 u[IU]/mL — ABNORMAL HIGH (ref 0.350–4.500)

## 2020-03-06 LAB — PROTIME-INR
INR: 1.1 (ref 0.8–1.2)
Prothrombin Time: 14.2 seconds (ref 11.4–15.2)

## 2020-03-06 LAB — CBC
HCT: 24.9 % — ABNORMAL LOW (ref 39.0–52.0)
Hemoglobin: 7.7 g/dL — ABNORMAL LOW (ref 13.0–17.0)
MCH: 30.4 pg (ref 26.0–34.0)
MCHC: 30.9 g/dL (ref 30.0–36.0)
MCV: 98.4 fL (ref 80.0–100.0)
Platelets: 198 10*3/uL (ref 150–400)
RBC: 2.53 MIL/uL — ABNORMAL LOW (ref 4.22–5.81)
RDW: 19.1 % — ABNORMAL HIGH (ref 11.5–15.5)
WBC: 4 10*3/uL (ref 4.0–10.5)
nRBC: 0 % (ref 0.0–0.2)

## 2020-03-06 LAB — T4, FREE: Free T4: 0.89 ng/dL (ref 0.61–1.12)

## 2020-03-08 ENCOUNTER — Other Ambulatory Visit (HOSPITAL_COMMUNITY): Payer: Medicare HMO

## 2020-03-08 LAB — CBC
HCT: 24.9 % — ABNORMAL LOW (ref 39.0–52.0)
Hemoglobin: 7.6 g/dL — ABNORMAL LOW (ref 13.0–17.0)
MCH: 29.9 pg (ref 26.0–34.0)
MCHC: 30.5 g/dL (ref 30.0–36.0)
MCV: 98 fL (ref 80.0–100.0)
Platelets: 229 10*3/uL (ref 150–400)
RBC: 2.54 MIL/uL — ABNORMAL LOW (ref 4.22–5.81)
RDW: 18.4 % — ABNORMAL HIGH (ref 11.5–15.5)
WBC: 4.4 10*3/uL (ref 4.0–10.5)
nRBC: 0 % (ref 0.0–0.2)

## 2020-03-09 ENCOUNTER — Other Ambulatory Visit (HOSPITAL_COMMUNITY): Payer: Medicare HMO

## 2020-03-09 DIAGNOSIS — I2510 Atherosclerotic heart disease of native coronary artery without angina pectoris: Secondary | ICD-10-CM

## 2020-03-09 DIAGNOSIS — J9621 Acute and chronic respiratory failure with hypoxia: Secondary | ICD-10-CM

## 2020-03-09 DIAGNOSIS — I4891 Unspecified atrial fibrillation: Secondary | ICD-10-CM

## 2020-03-09 DIAGNOSIS — J449 Chronic obstructive pulmonary disease, unspecified: Secondary | ICD-10-CM

## 2020-03-09 DIAGNOSIS — I469 Cardiac arrest, cause unspecified: Secondary | ICD-10-CM

## 2020-03-09 NOTE — Consult Note (Signed)
Pulmonary Critical Care Medicine [facility] [place]  PULMONARY SERVICE  Date of Service: 03/09/2020  PULMONARY CRITICAL CARE CONSULT   Tony Munoz  ZCH:[id number]  DOB: May 25, 1956   DOA: 03/04/2020  Referring Physician: Carron Curie, MD  HPI: Tony Munoz is a 64 y.o. male seen for follow up of Acute on Chronic Respiratory Failure.  Patient has multiple medical problems including chronic anxiety carotid artery occlusion coronary artery disease hypertension abnormal EKG presented to the hospital for a carotid endarterectomy and a CABG.  The patient underwent both procedures and a CABG x4 postoperatively had a very complicated course with development of rapid atrial fibrillation.  The patient had been started on amiodarone.  The patient also was not able to come off of the ventilator.  Subsequently required prolonged mechanical ventilation and eventually ended up with a tracheostomy.  Patient was transferred to our facility for further management and weaning.  Other complications included CODE BLUE cardiac arrest requiring CPR and IV epinephrine.  Patient did okay as far as the weaning is concerned and now is on T collar.  Review of Systems:  ROS performed and is unremarkable other than noted above.  Past Medical History:  Diagnosis Date  . Abnormal EKG   . Anxiety   . Bilateral carotid bruits 12/20/2019  . Carotid artery occlusion   . Cigarette smoker 12/20/2019  . Coronary artery disease   . DOE (dyspnea on exertion) 12/20/2019  . Dyspnea   . Essential hypertension 12/20/2019  . Prominent abdominal aortic pulsation 12/20/2019  . Radiculopathy 02/15/2019    Past Surgical History:  Procedure Laterality Date  . ANTERIOR CERVICAL DECOMPRESSION/DISCECTOMY FUSION 4 LEVELS Bilateral 02/15/2019   Procedure: ANTERIOR CERVICAL DECOMPRESSION FUSION, CERVICAL FOUR-FIVE, CERVICAL FIVE-SIX, CERVICAL SIX-SEVEN WITH INSTRUMENTATION AND ALLOGRAFT;  Surgeon: Estill Bamberg, MD;  Location:  [facility] OR;  Service: Orthopedics;  Laterality: Bilateral;  . BACK SURGERY    . CORONARY ARTERY BYPASS GRAFT N/A 02/06/2020   Procedure: CORONARY ARTERY BYPASS GRAFTING (CABG),  x Four with Bilateral IMAs, and right leg greater saphenous vein;  Surgeon: [name], MD;  Location: [facility] OR;  Service: Open Heart Surgery;  Laterality: N/A;  . ENDARTERECTOMY Left 02/06/2020   Procedure: CAROTID ENDARTERECTOMY LEFT with  Patch Angioplasty Using Hemashield Platinum Finesse patch;  Surgeon: Sherren Kerns, MD;  Location: [facility] OR;  Service: Vascular;  Laterality: Left;  . EYE SURGERY Bilateral    Cataracts  . HERNIA REPAIR     double hernia - 50yrs ago  . LEFT HEART CATH AND CORONARY ANGIOGRAPHY N/A 01/19/2020   Procedure: LEFT HEART CATH AND CORONARY ANGIOGRAPHY;  Surgeon: Lyn Records, MD;  Location: [facility] INVASIVE CV LAB;  Service: Cardiovascular;  Laterality: N/A;  . TEE WITHOUT CARDIOVERSION N/A 02/06/2020   Procedure: TRANSESOPHAGEAL ECHOCARDIOGRAM (TEE);  Surgeon: [name], MD;  Location: [facility] OR;  Service: Open Heart Surgery;  Laterality: N/A;  . TONSILLECTOMY      Social History:    reports that he has been smoking cigarettes. He has a 75.00 pack-year smoking history. He has never used smokeless tobacco. He reports current alcohol use. He reports that he does not use drugs.  Family History: Non-Contributory to the present illness  No Known Allergies  Medications: Reviewed on Rounds  Physical Exam:  Vitals: Temperature 96.6 pulse 74 respiratory rate 24 blood pressure 109/61 saturations 100%  Ventilator Settings on pressure support FiO2 is 40% pressure support 12 PEEP 5 tidal volume 400  . General: Comfortable  at this time . Eyes: Grossly normal lids, irises & conjunctiva . ENT: grossly tongue is normal . Neck: no obvious mass . Cardiovascular: S1-S2 normal no gallop or rub . Respiratory: No rhonchi no rales are noted at this time . Abdomen: Soft and nontender . Skin: no rash  seen on limited exam . Musculoskeletal: not rigid . Psychiatric:unable to assess . Neurologic: no seizure no involuntary movements         Labs on Admission:  Basic Metabolic Panel: Recent Labs  Lab 03/03/20 0423 03/04/20 0321 03/05/20 0558  NA 138 138 139  K 3.8 4.0 3.9  CL 97* 101 100  CO2 30 28 29   GLUCOSE 123* 136* 125*  BUN 50* 51* 48*  CREATININE 0.90 0.81 0.87  CALCIUM 8.4* 8.3* 8.4*  MG 2.5* 2.4 2.4  PHOS  --   --  3.5    Recent Labs  Lab 03/04/20 2130  PHART 7.527*  PCO2ART 36.9  PO2ART 116*  HCO3 30.4*  O2SAT 99.1    Liver Function Tests: Recent Labs  Lab 03/05/20 0558  AST 41  ALT 61*  ALKPHOS 141*  BILITOT 0.7  PROT 5.5*  ALBUMIN 2.1*   No results for input(s): LIPASE, AMYLASE in the last 168 hours. No results for input(s): AMMONIA in the last 168 hours.  CBC: Recent Labs  Lab 03/03/20 0423 03/04/20 0321 03/05/20 0558 03/06/20 0632 03/08/20 0541  WBC 5.0 5.7 4.3 4.0 4.4  NEUTROABS  --   --  3.0  --   --   HGB 8.3* 8.0* 8.0* 7.7* 7.6*  HCT 27.0* 26.5* 26.0* 24.9* 24.9*  MCV 99.3 100.4* 98.5 98.4 98.0  PLT 159 166 185 198 229    Cardiac Enzymes: No results for input(s): CKTOTAL, CKMB, CKMBINDEX, TROPONINI in the last 168 hours.  BNP (last 3 results) Recent Labs    02/10/20 0355  BNP 675.6*    ProBNP (last 3 results) No results for input(s): PROBNP in the last 8760 hours.   Radiological Exams on Admission: DG Chest Port 1 View  Result Date: 03/09/2020 CLINICAL DATA:  Edema. EXAM: PORTABLE CHEST 1 VIEW COMPARISON:  02/07/2020 FINDINGS: Tracheostomy tube is unchanged. Feeding tube is in place, tip to level of the proximal stomach. Status post median sternotomy. Heterogeneous pulmonary infiltrates are again noted throughout the lungs, LEFT greater than RIGHT. LEFT pleural effusion is similar to prior. Emphysematous changes are seen in the apices, RIGHT greater than LEFT and stable in appearance. IMPRESSION: 1. Stable appearance of  the chest. 2. Feeding tube to the proximal stomach. Consider advancing further into the stomach. Electronically Signed   By: 04/08/2020 M.D.   On: 03/09/2020 16:30   DG CHEST PORT 1 VIEW  Result Date: 03/08/2020 CLINICAL DATA:  Pneumonia EXAM: PORTABLE CHEST 1 VIEW COMPARISON:  03/05/2020 FINDINGS: Interval increase in heterogeneous and interstitial airspace opacity of the left greater than right lungs, with emphysematous change of the right pulmonary apex. Probable layering pleural effusions. Tracheostomy. Status post median sternotomy. IMPRESSION: Interval increase in heterogeneous and interstitial airspace opacity of the left greater than right lungs, with emphysematous change of the right pulmonary apex. Probable layering pleural effusions. Findings are consistent with worsened multifocal infection and/or edema. Electronically Signed   By: 03/07/2020 M.D.   On: 03/08/2020 14:02    Assessment/Plan Active Problems:   Acute on chronic respiratory failure with hypoxia [facility])   Cardiac arrest (HCC)   COPD, severe (HCC)   Coronary artery disease involving native  coronary artery of native heart   Atrial fibrillation, rapid (HCC)   1. Acute on chronic respiratory failure with hypoxia patient right now is on the weaning protocol on pressure support 12/5 and the goal is for 16 hours.  Patient is having good tidal volumes.  I did also follow-up with an x-ray tomorrow as patient did have some increased pulmonary edema noted on the last chest film 2. Cardiac arrest currently rhythm has been stable we will continue to monitor. 3. Severe COPD by history patient apparently has severe bullous emphysema with a history of ongoing tobacco use the patient will be continued with medical management. 4. Coronary artery disease status post CABG x4 we will continue with supportive care. 5. Atrial fibrillation patient had atrial fibrillation with rapid ventricular response at the other facility we will continue  to monitor him on telemetry.  I have personally seen and evaluated the patient, evaluated laboratory and imaging results, formulated the assessment and plan and placed orders. The Patient requires high complexity decision making with multiple systems involvement.  Case was discussed on Rounds with the Respiratory Therapy Director and the Respiratory staff Time Spent  Yevonne Pax, MD [facility] Pulmonary Critical Care Medicine Sleep Medicine

## 2020-03-10 DIAGNOSIS — I2510 Atherosclerotic heart disease of native coronary artery without angina pectoris: Secondary | ICD-10-CM | POA: Diagnosis present

## 2020-03-10 DIAGNOSIS — J9621 Acute and chronic respiratory failure with hypoxia: Secondary | ICD-10-CM | POA: Diagnosis present

## 2020-03-10 DIAGNOSIS — I469 Cardiac arrest, cause unspecified: Secondary | ICD-10-CM | POA: Diagnosis present

## 2020-03-10 DIAGNOSIS — I4891 Unspecified atrial fibrillation: Secondary | ICD-10-CM | POA: Diagnosis present

## 2020-03-10 DIAGNOSIS — J449 Chronic obstructive pulmonary disease, unspecified: Secondary | ICD-10-CM | POA: Diagnosis present

## 2020-03-10 LAB — CBC
HCT: 27.2 % — ABNORMAL LOW (ref 39.0–52.0)
Hemoglobin: 8.2 g/dL — ABNORMAL LOW (ref 13.0–17.0)
MCH: 29.3 pg (ref 26.0–34.0)
MCHC: 30.1 g/dL (ref 30.0–36.0)
MCV: 97.1 fL (ref 80.0–100.0)
Platelets: 253 10*3/uL (ref 150–400)
RBC: 2.8 MIL/uL — ABNORMAL LOW (ref 4.22–5.81)
RDW: 17.8 % — ABNORMAL HIGH (ref 11.5–15.5)
WBC: 4.4 10*3/uL (ref 4.0–10.5)
nRBC: 0 % (ref 0.0–0.2)

## 2020-03-10 LAB — BASIC METABOLIC PANEL
Anion gap: 10 (ref 5–15)
BUN: 40 mg/dL — ABNORMAL HIGH (ref 8–23)
CO2: 32 mmol/L (ref 22–32)
Calcium: 8.7 mg/dL — ABNORMAL LOW (ref 8.9–10.3)
Chloride: 97 mmol/L — ABNORMAL LOW (ref 98–111)
Creatinine, Ser: 0.75 mg/dL (ref 0.61–1.24)
GFR calc Af Amer: >60 mL/min (ref >60)
GFR calc non Af Amer: >60 mL/min (ref >60)
Glucose, Bld: 127 mg/dL — ABNORMAL HIGH (ref 70–99)
Potassium: 3.7 mmol/L (ref 3.5–5.1)
Sodium: 139 mmol/L (ref 135–145)

## 2020-03-10 LAB — MAGNESIUM: Magnesium: 2.1 mg/dL (ref 1.7–2.4)

## 2020-03-10 NOTE — Progress Notes (Signed)
Pulmonary Critical Care Medicine [facility] [place]   PULMONARY CRITICAL CARE SERVICE  PROGRESS NOTE  Date of Service: 03/10/2020  NASON CONRADT  ZOX:[id number]  DOB: Aug 30, 1956   DOA: 03/04/2020  Referring Physician: Carron Curie, MD  HPI: SHANTEL WESELY is a 64 y.o. male seen for follow up of Acute on Chronic Respiratory Failure.  Patient currently is on T collar has been on 40% FiO2 today's goal is for 2 hours  Medications: Reviewed on Rounds  Physical Exam:  Vitals: Temperature is 96.0 pulse 72 respiratory 24 blood pressure is 118/70 saturations 100%  Ventilator Settings on T collar with an FiO2 40%  . General: Comfortable at this time . Eyes: Grossly normal lids, irises & conjunctiva . ENT: grossly tongue is normal . Neck: no obvious mass . Cardiovascular: S1 S2 normal no gallop . Respiratory: No rhonchi coarse breath sounds . Abdomen: soft . Skin: no rash seen on limited exam . Musculoskeletal: not rigid . Psychiatric:unable to assess . Neurologic: no seizure no involuntary movements         Lab Data:   Basic Metabolic Panel: Recent Labs  Lab 03/04/20 0321 03/05/20 0558 03/10/20 0443  NA 138 139 139  K 4.0 3.9 3.7  CL 101 100 97*  CO2 28 29 32  GLUCOSE 136* 125* 127*  BUN 51* 48* 40*  CREATININE 0.81 0.87 0.75  CALCIUM 8.3* 8.4* 8.7*  MG 2.4 2.4 2.1  PHOS  --  3.5  --     ABG: Recent Labs  Lab 03/04/20 2130  PHART 7.527*  PCO2ART 36.9  PO2ART 116*  HCO3 30.4*  O2SAT 99.1    Liver Function Tests: Recent Labs  Lab 03/05/20 0558  AST 41  ALT 61*  ALKPHOS 141*  BILITOT 0.7  PROT 5.5*  ALBUMIN 2.1*   No results for input(s): LIPASE, AMYLASE in the last 168 hours. No results for input(s): AMMONIA in the last 168 hours.  CBC: Recent Labs  Lab 03/04/20 0321 03/05/20 0558 03/06/20 0632 03/08/20 0541 03/10/20 0443  WBC 5.7 4.3 4.0 4.4 4.4  NEUTROABS  --  3.0  --   --   --   HGB 8.0* 8.0* 7.7* 7.6* 8.2*  HCT 26.5*  26.0* 24.9* 24.9* 27.2*  MCV 100.4* 98.5 98.4 98.0 97.1  PLT 166 185 198 229 253    Cardiac Enzymes: No results for input(s): CKTOTAL, CKMB, CKMBINDEX, TROPONINI in the last 168 hours.  BNP (last 3 results) Recent Labs    02/10/20 0355  BNP 675.6*    ProBNP (last 3 results) No results for input(s): PROBNP in the last 8760 hours.  Radiological Exams: DG Chest Port 1 View  Result Date: 03/09/2020 CLINICAL DATA:  Edema. EXAM: PORTABLE CHEST 1 VIEW COMPARISON:  02/07/2020 FINDINGS: Tracheostomy tube is unchanged. Feeding tube is in place, tip to level of the proximal stomach. Status post median sternotomy. Heterogeneous pulmonary infiltrates are again noted throughout the lungs, LEFT greater than RIGHT. LEFT pleural effusion is similar to prior. Emphysematous changes are seen in the apices, RIGHT greater than LEFT and stable in appearance. IMPRESSION: 1. Stable appearance of the chest. 2. Feeding tube to the proximal stomach. Consider advancing further into the stomach. Electronically Signed   By: Norva Pavlov M.D.   On: 03/09/2020 16:30   DG CHEST PORT 1 VIEW  Result Date: 03/08/2020 CLINICAL DATA:  Pneumonia EXAM: PORTABLE CHEST 1 VIEW COMPARISON:  03/05/2020 FINDINGS: Interval increase in heterogeneous and interstitial airspace opacity of the  left greater than right lungs, with emphysematous change of the right pulmonary apex. Probable layering pleural effusions. Tracheostomy. Status post median sternotomy. IMPRESSION: Interval increase in heterogeneous and interstitial airspace opacity of the left greater than right lungs, with emphysematous change of the right pulmonary apex. Probable layering pleural effusions. Findings are consistent with worsened multifocal infection and/or edema. Electronically Signed   By: Lauralyn Primes M.D.   On: 03/08/2020 14:02    Assessment/Plan Active Problems:   Acute on chronic respiratory failure with hypoxia (HCC)   Cardiac arrest (HCC)   COPD, severe  (HCC)   Coronary artery disease involving native coronary artery of native heart   Atrial fibrillation, rapid (HCC)   1. Acute on chronic respiratory failure hypoxia continue with the weaning protocol on T collar doing fine right now with a goal of 2 hours.  Spoke with respiratory therapy on rounds with the patient is able to tolerate we will proceed to increasing the goal as tolerated 2. Cardiac arrest rhythm is stable right now we will continue with supportive care patient suffered cardiac arrest post CABG 3. Severe COPD medical management 4. Coronary artery disease status post CABG 5. Rapid atrial fibrillation rate controlled right now appears to be in normal rhythm   I have personally seen and evaluated the patient, evaluated laboratory and imaging results, formulated the assessment and plan and placed orders. The Patient requires high complexity decision making with multiple systems involvement.  Rounds were done with the Respiratory Therapy Director and Staff therapists and discussed with nursing staff also.  Yevonne Pax, MD [facility] Pulmonary Critical Care Medicine Sleep Medicine

## 2020-03-11 NOTE — Progress Notes (Signed)
Pulmonary Critical Care Medicine [facility] [place]   PULMONARY CRITICAL CARE SERVICE  PROGRESS NOTE  Date of Service: 03/11/2020  Tony Munoz  OZY:[id number]  DOB: 23-Jun-1956   DOA: 03/04/2020  Referring Physician: Carron Curie, MD  HPI: Tony Munoz is a 64 y.o. male seen for follow up of Acute on Chronic Respiratory Failure.  Patient currently is on T collar has been on 35% FiO2 with good saturations  Medications: Reviewed on Rounds  Physical Exam:  Vitals: Temperature is 98.2 pulse 20 respiratory rate was 18 blood pressure is 113/68 saturations 100%  Ventilator Settings on T collar FiO2 is 35%   General: Comfortable at this time  Eyes: Grossly normal lids, irises & conjunctiva  ENT: grossly tongue is normal  Neck: no obvious mass  Cardiovascular: S1 S2 normal no gallop  Respiratory: No rhonchi or rales are noted at this time  Abdomen: soft  Skin: no rash seen on limited exam  Musculoskeletal: not rigid  Psychiatric:unable to assess  Neurologic: no seizure no involuntary movements         Lab Data:   Basic Metabolic Panel: Recent Labs  Lab 03/05/20 0558 03/10/20 0443  NA 139 139  K 3.9 3.7  CL 100 97*  CO2 29 32  GLUCOSE 125* 127*  BUN 48* 40*  CREATININE 0.87 0.75  CALCIUM 8.4* 8.7*  MG 2.4 2.1  PHOS 3.5  --     ABG: Recent Labs  Lab 03/04/20 2130  PHART 7.527*  PCO2ART 36.9  PO2ART 116*  HCO3 30.4*  O2SAT 99.1    Liver Function Tests: Recent Labs  Lab 03/05/20 0558  AST 41  ALT 61*  ALKPHOS 141*  BILITOT 0.7  PROT 5.5*  ALBUMIN 2.1*   No results for input(s): LIPASE, AMYLASE in the last 168 hours. No results for input(s): AMMONIA in the last 168 hours.  CBC: Recent Labs  Lab 03/05/20 0558 03/06/20 0632 03/08/20 0541 03/10/20 0443  WBC 4.3 4.0 4.4 4.4  NEUTROABS 3.0  --   --   --   HGB 8.0* 7.7* 7.6* 8.2*  HCT 26.0* 24.9* 24.9* 27.2*  MCV 98.5 98.4 98.0 97.1  PLT 185 198 229 253    Cardiac  Enzymes: No results for input(s): CKTOTAL, CKMB, CKMBINDEX, TROPONINI in the last 168 hours.  BNP (last 3 results) Recent Labs    02/10/20 0355  BNP 675.6*    ProBNP (last 3 results) No results for input(s): PROBNP in the last 8760 hours.  Radiological Exams: DG Chest Port 1 View  Result Date: 03/09/2020 CLINICAL DATA:  Edema. EXAM: PORTABLE CHEST 1 VIEW COMPARISON:  02/07/2020 FINDINGS: Tracheostomy tube is unchanged. Feeding tube is in place, tip to level of the proximal stomach. Status post median sternotomy. Heterogeneous pulmonary infiltrates are again noted throughout the lungs, LEFT greater than RIGHT. LEFT pleural effusion is similar to prior. Emphysematous changes are seen in the apices, RIGHT greater than LEFT and stable in appearance. IMPRESSION: 1. Stable appearance of the chest. 2. Feeding tube to the proximal stomach. Consider advancing further into the stomach. Electronically Signed   By: Norva Pavlov M.D.   On: 03/09/2020 16:30    Assessment/Plan Active Problems:   Acute on chronic respiratory failure with hypoxia (HCC)   Cardiac arrest (HCC)   COPD, severe (HCC)   Coronary artery disease involving native coronary artery of native heart   Atrial fibrillation, rapid (HCC)   1. Acute on chronic respiratory failure hypoxia continue to wean  on T collar the goal is for up to 12 hours 2. Cardiac arrest rhythm is stable we will monitor 3. Severe COPD at baseline continue present management 4. Coronary artery disease stable we will continue with supportive care 5. Atrial fibrillation rate controlled   I have personally seen and evaluated the patient, evaluated laboratory and imaging results, formulated the assessment and plan and placed orders. The Patient requires high complexity decision making with multiple systems involvement.  Rounds were done with the Respiratory Therapy Director and Staff therapists and discussed with nursing staff also.  Yevonne Pax, MD  [facility] Pulmonary Critical Care Medicine Sleep Medicine

## 2020-03-12 LAB — BASIC METABOLIC PANEL
Anion gap: 11 (ref 5–15)
BUN: 33 mg/dL — ABNORMAL HIGH (ref 8–23)
CO2: 30 mmol/L (ref 22–32)
Calcium: 8.2 mg/dL — ABNORMAL LOW (ref 8.9–10.3)
Chloride: 93 mmol/L — ABNORMAL LOW (ref 98–111)
Creatinine, Ser: 0.81 mg/dL (ref 0.61–1.24)
GFR calc Af Amer: >60 mL/min (ref >60)
GFR calc non Af Amer: >60 mL/min (ref >60)
Glucose, Bld: 112 mg/dL — ABNORMAL HIGH (ref 70–99)
Potassium: 3.9 mmol/L (ref 3.5–5.1)
Sodium: 134 mmol/L — ABNORMAL LOW (ref 135–145)

## 2020-03-12 LAB — CBC
HCT: 26.4 % — ABNORMAL LOW (ref 39.0–52.0)
Hemoglobin: 8.1 g/dL — ABNORMAL LOW (ref 13.0–17.0)
MCH: 28.9 pg (ref 26.0–34.0)
MCHC: 30.7 g/dL (ref 30.0–36.0)
MCV: 94.3 fL (ref 80.0–100.0)
Platelets: 239 10*3/uL (ref 150–400)
RBC: 2.8 MIL/uL — ABNORMAL LOW (ref 4.22–5.81)
RDW: 17.2 % — ABNORMAL HIGH (ref 11.5–15.5)
WBC: 4.6 10*3/uL (ref 4.0–10.5)
nRBC: 0 % (ref 0.0–0.2)

## 2020-03-12 LAB — PHOSPHORUS: Phosphorus: 3.9 mg/dL (ref 2.5–4.6)

## 2020-03-12 LAB — MAGNESIUM: Magnesium: 2.1 mg/dL (ref 1.7–2.4)

## 2020-03-12 NOTE — Progress Notes (Signed)
Pulmonary Critical Care Medicine [facility] [place]   PULMONARY CRITICAL CARE SERVICE  PROGRESS NOTE  Date of Service: 03/12/2020  Tony Munoz  ERX:[id number]  DOB: 03-26-1956   DOA: 03/04/2020  Referring Physician: Carron Curie, MD  HPI: Tony Munoz is a 64 y.o. male seen for follow up of Acute on Chronic Respiratory Failure.  Patient currently is on T collar has been on 30% FiO2 the goal today is to try for 16 hours  Medications: Reviewed on Rounds  Physical Exam:  Vitals: Temperature is 96.6 pulse 85 respiratory rate 35 blood pressure is 117/56 saturations 100%  Ventilator Settings on T collar FiO2 30%   General: Comfortable at this time  Eyes: Grossly normal lids, irises & conjunctiva  ENT: grossly tongue is normal  Neck: no obvious mass  Cardiovascular: S1 S2 normal no gallop  Respiratory: No rhonchi coarse breath sounds  Abdomen: soft  Skin: no rash seen on limited exam  Musculoskeletal: not rigid  Psychiatric:unable to assess  Neurologic: no seizure no involuntary movements         Lab Data:   Basic Metabolic Panel: Recent Labs  Lab 03/10/20 0443 03/12/20 0509  NA 139 134*  K 3.7 3.9  CL 97* 93*  CO2 32 30  GLUCOSE 127* 112*  BUN 40* 33*  CREATININE 0.75 0.81  CALCIUM 8.7* 8.2*  MG 2.1 2.1  PHOS  --  3.9    ABG: No results for input(s): PHART, PCO2ART, PO2ART, HCO3, O2SAT in the last 168 hours.  Liver Function Tests: No results for input(s): AST, ALT, ALKPHOS, BILITOT, PROT, ALBUMIN in the last 168 hours. No results for input(s): LIPASE, AMYLASE in the last 168 hours. No results for input(s): AMMONIA in the last 168 hours.  CBC: Recent Labs  Lab 03/06/20 0632 03/08/20 0541 03/10/20 0443 03/12/20 0509  WBC 4.0 4.4 4.4 4.6  HGB 7.7* 7.6* 8.2* 8.1*  HCT 24.9* 24.9* 27.2* 26.4*  MCV 98.4 98.0 97.1 94.3  PLT 198 229 253 239    Cardiac Enzymes: No results for input(s): CKTOTAL, CKMB, CKMBINDEX, TROPONINI in the  last 168 hours.  BNP (last 3 results) Recent Labs    02/10/20 0355  BNP 675.6*    ProBNP (last 3 results) No results for input(s): PROBNP in the last 8760 hours.  Radiological Exams: No results found.  Assessment/Plan Active Problems:   Acute on chronic respiratory failure with hypoxia (HCC)   Cardiac arrest (HCC)   COPD, severe (HCC)   Coronary artery disease involving native coronary artery of native heart   Atrial fibrillation, rapid (HCC)   1. Acute on chronic respiratory failure hypoxia we will continue to wean on T collar with a goal of 16 hours.  Titrate oxygen as necessary 2. Cardiac arrest rhythm has been stable continue to monitor 3. Severe COPD at baseline 4. Coronary artery disease no active chest pain stable 5. Atrial fibrillation rate controlled we will continue present management   I have personally seen and evaluated the patient, evaluated laboratory and imaging results, formulated the assessment and plan and placed orders. The Patient requires high complexity decision making with multiple systems involvement.  Rounds were done with the Respiratory Therapy Director and Staff therapists and discussed with nursing staff also.  Yevonne Pax, MD [facility] Pulmonary Critical Care Medicine Sleep Medicine

## 2020-03-13 NOTE — Progress Notes (Signed)
Pulmonary Critical Care Medicine [facility] [place]   PULMONARY CRITICAL CARE SERVICE  PROGRESS NOTE  Date of Service: 03/13/2020  Tony Munoz  [id number]  DOB: 02-May-1956   DOA: 03/04/2020  Referring Physician: Carron Curie, MD  HPI: Tony Munoz is a 64 y.o. male seen for follow up of Acute on Chronic Respiratory Failure.  Patient currently is on T collar has been on 35% FiO2 the goal is for 20 hours off the vent  Medications: Reviewed on Rounds  Physical Exam:  Vitals: Temperature is 98.6 pulse 76 respiratory 18 blood pressure is 100/61 saturations 100%  Ventilator Settings on T collar FiO2 is 35%  . General: Comfortable at this time . Eyes: Grossly normal lids, irises & conjunctiva . ENT: grossly tongue is normal . Neck: no obvious mass . Cardiovascular: S1 S2 normal no gallop . Respiratory: No rhonchi coarse breath sounds . Abdomen: soft . Skin: no rash seen on limited exam . Musculoskeletal: not rigid . Psychiatric:unable to assess . Neurologic: no seizure no involuntary movements         Lab Data:   Basic Metabolic Panel: Recent Labs  Lab 03/10/20 0443 03/12/20 0509  NA 139 134*  K 3.7 3.9  CL 97* 93*  CO2 32 30  GLUCOSE 127* 112*  BUN 40* 33*  CREATININE 0.75 0.81  CALCIUM 8.7* 8.2*  MG 2.1 2.1  PHOS  --  3.9    ABG: No results for input(s): PHART, PCO2ART, PO2ART, HCO3, O2SAT in the last 168 hours.  Liver Function Tests: No results for input(s): AST, ALT, ALKPHOS, BILITOT, PROT, ALBUMIN in the last 168 hours. No results for input(s): LIPASE, AMYLASE in the last 168 hours. No results for input(s): AMMONIA in the last 168 hours.  CBC: Recent Labs  Lab 03/08/20 0541 03/10/20 0443 03/12/20 0509  WBC 4.4 4.4 4.6  HGB 7.6* 8.2* 8.1*  HCT 24.9* 27.2* 26.4*  MCV 98.0 97.1 94.3  PLT 229 253 239    Cardiac Enzymes: No results for input(s): CKTOTAL, CKMB, CKMBINDEX, TROPONINI in the last 168 hours.  BNP (last 3  results) Recent Labs    02/10/20 0355  BNP 675.6*    ProBNP (last 3 results) No results for input(s): PROBNP in the last 8760 hours.  Radiological Exams: No results found.  Assessment/Plan Active Problems:   Acute on chronic respiratory failure with hypoxia (HCC)   Cardiac arrest (HCC)   COPD, severe (HCC)   Coronary artery disease involving native coronary artery of native heart   Atrial fibrillation, rapid (HCC)   1. Acute on chronic respiratory failure hypoxia we will continue with the T collar weaning as tolerated titrate oxygen down as tolerated today the goal is for 20 hours 2. Cardiac arrest rhythm is stable at this time we will continue with supportive care 3. Severe COPD stable at this time medical management 4. Coronary artery disease at baseline 5. Rapid atrial fibrillation rate controlled continue to follow   I have personally seen and evaluated the patient, evaluated laboratory and imaging results, formulated the assessment and plan and placed orders. The Patient requires high complexity decision making with multiple systems involvement.  Rounds were done with the Respiratory Therapy Director and Staff therapists and discussed with nursing staff also.  Yevonne Pax, MD [facility] Pulmonary Critical Care Medicine Sleep Medicine

## 2020-03-14 ENCOUNTER — Ambulatory Visit: Payer: Medicare HMO | Admitting: Cardiothoracic Surgery

## 2020-03-14 LAB — CBC
HCT: 28 % — ABNORMAL LOW (ref 39.0–52.0)
Hemoglobin: 8.7 g/dL — ABNORMAL LOW (ref 13.0–17.0)
MCH: 29.4 pg (ref 26.0–34.0)
MCHC: 31.1 g/dL (ref 30.0–36.0)
MCV: 94.6 fL (ref 80.0–100.0)
Platelets: 253 10*3/uL (ref 150–400)
RBC: 2.96 MIL/uL — ABNORMAL LOW (ref 4.22–5.81)
RDW: 16.9 % — ABNORMAL HIGH (ref 11.5–15.5)
WBC: 5.6 10*3/uL (ref 4.0–10.5)
nRBC: 0 % (ref 0.0–0.2)

## 2020-03-14 LAB — BASIC METABOLIC PANEL
Anion gap: 10 (ref 5–15)
BUN: 31 mg/dL — ABNORMAL HIGH (ref 8–23)
CO2: 33 mmol/L — ABNORMAL HIGH (ref 22–32)
Calcium: 8.4 mg/dL — ABNORMAL LOW (ref 8.9–10.3)
Chloride: 92 mmol/L — ABNORMAL LOW (ref 98–111)
Creatinine, Ser: 0.84 mg/dL (ref 0.61–1.24)
GFR calc Af Amer: >60 mL/min (ref >60)
GFR calc non Af Amer: >60 mL/min (ref >60)
Glucose, Bld: 114 mg/dL — ABNORMAL HIGH (ref 70–99)
Potassium: 3.7 mmol/L (ref 3.5–5.1)
Sodium: 135 mmol/L (ref 135–145)

## 2020-03-14 LAB — HEMOGLOBIN A1C
Hgb A1c MFr Bld: 5.2 % (ref 4.8–5.6)
Mean Plasma Glucose: 102.54 mg/dL

## 2020-03-14 LAB — MAGNESIUM: Magnesium: 2.1 mg/dL (ref 1.7–2.4)

## 2020-03-14 LAB — PHOSPHORUS: Phosphorus: 3.6 mg/dL (ref 2.5–4.6)

## 2020-03-14 LAB — TSH: TSH: 14.366 u[IU]/mL — ABNORMAL HIGH (ref 0.350–4.500)

## 2020-03-14 NOTE — Progress Notes (Signed)
Pulmonary Critical Care Medicine [facility] [place]   PULMONARY CRITICAL CARE SERVICE  PROGRESS NOTE  Date of Service: 03/14/2020  Tony Munoz  [id number]:[id number]  DOB: Nov 18, 1955   DOA: 03/04/2020  Referring Physician: Carron Curie, MD  HPI: Tony Munoz is a 64 y.o. male seen for follow up of Acute on Chronic Respiratory Failure.  Patient is on T collar when the goal today is for 24 hours  Medications: Reviewed on Rounds  Physical Exam:  Vitals: Temperature is 97.8 pulse 83 respiratory 30 blood pressure is 102/58 saturations 99%  Ventilator Settings on T collar FiO2 is 28 percent  . General: Comfortable at this time . Eyes: Grossly normal lids, irises & conjunctiva . ENT: grossly tongue is normal . Neck: no obvious mass . Cardiovascular: S1 S2 normal no gallop . Respiratory: No rhonchi no rales are noted at this time . Abdomen: soft . Skin: no rash seen on limited exam . Musculoskeletal: not rigid . Psychiatric:unable to assess . Neurologic: no seizure no involuntary movements         Lab Data:   Basic Metabolic Panel: Recent Labs  Lab 03/10/20 0443 03/12/20 0509 03/14/20 0604  NA 139 134* 135  K 3.7 3.9 3.7  CL 97* 93* 92*  CO2 32 30 33*  GLUCOSE 127* 112* 114*  BUN 40* 33* 31*  CREATININE 0.75 0.81 0.84  CALCIUM 8.7* 8.2* 8.4*  MG 2.1 2.1 2.1  PHOS  --  3.9 3.6    ABG: No results for input(s): PHART, PCO2ART, PO2ART, HCO3, O2SAT in the last 168 hours.  Liver Function Tests: No results for input(s): AST, ALT, ALKPHOS, BILITOT, PROT, ALBUMIN in the last 168 hours. No results for input(s): LIPASE, AMYLASE in the last 168 hours. No results for input(s): AMMONIA in the last 168 hours.  CBC: Recent Labs  Lab 03/08/20 0541 03/10/20 0443 03/12/20 0509 03/14/20 0604  WBC 4.4 4.4 4.6 5.6  HGB 7.6* 8.2* 8.1* 8.7*  HCT 24.9* 27.2* 26.4* 28.0*  MCV 98.0 97.1 94.3 94.6  PLT 229 253 239 253    Cardiac Enzymes: No results for input(s):  CKTOTAL, CKMB, CKMBINDEX, TROPONINI in the last 168 hours.  BNP (last 3 results) Recent Labs    02/10/20 0355  BNP 675.6*    ProBNP (last 3 results) No results for input(s): PROBNP in the last 8760 hours.  Radiological Exams: No results found.  Assessment/Plan Active Problems:   Acute on chronic respiratory failure with hypoxia (HCC)   Cardiac arrest (HCC)   COPD, severe (HCC)   Coronary artery disease involving native coronary artery of native heart   Atrial fibrillation, rapid (HCC)   1. Acute on chronic respiratory failure hypoxia we will continue with the weaning protocol today's goal is 24 hours on T-bar. 2. Cardiac arrest rhythm is stable we will continue with supportive care 3. Severe COPD at baseline 4. Coronary artery disease no change 5. Atrial fibrillation rate controlled   I have personally seen and evaluated the patient, evaluated laboratory and imaging results, formulated the assessment and plan and placed orders. The Patient requires high complexity decision making with multiple systems involvement.  Rounds were done with the Respiratory Therapy Director and Staff therapists and discussed with nursing staff also.  Yevonne Pax, MD [facility] Pulmonary Critical Care Medicine Sleep Medicine

## 2020-03-15 LAB — BLOOD GAS, ARTERIAL
Acid-Base Excess: 9.9 mmol/L — ABNORMAL HIGH (ref 0.0–2.0)
Bicarbonate: 33.8 mmol/L — ABNORMAL HIGH (ref 20.0–28.0)
FIO2: 35
O2 Saturation: 96.8 %
Patient temperature: 37.4
pCO2 arterial: 45.6 mmHg (ref 32.0–48.0)
pH, Arterial: 7.485 — ABNORMAL HIGH (ref 7.350–7.450)
pO2, Arterial: 86.5 mmHg (ref 83.0–108.0)

## 2020-03-15 LAB — URINALYSIS, ROUTINE W REFLEX MICROSCOPIC
Bilirubin Urine: NEGATIVE
Glucose, UA: NEGATIVE mg/dL
Ketones, ur: NEGATIVE mg/dL
Nitrite: NEGATIVE
Protein, ur: NEGATIVE mg/dL
Specific Gravity, Urine: 1.015 (ref 1.005–1.030)
pH: 5 (ref 5.0–8.0)

## 2020-03-15 NOTE — Progress Notes (Signed)
Pulmonary Critical Care Medicine [facility] [place]   PULMONARY CRITICAL CARE SERVICE  PROGRESS NOTE  Date of Service: 03/15/2020  Tony Munoz  [id number]  DOB: 1956/05/04   DOA: 03/04/2020  Referring Physician: Carron Curie, MD  HPI: Tony Munoz is a 64 y.o. male seen for follow up of Acute on Chronic Respiratory Failure. Patient currently is on T collar today will be completing 24 hours off the ventilator  Medications: Reviewed on Rounds  Physical Exam:  Vitals: Temperature is 97.3 pulse 88 respiratory rate 29 blood pressure is 115/63 saturations 96%  Ventilator Settings on T collar FiO2 35%  . General: Comfortable at this time . Eyes: Grossly normal lids, irises & conjunctiva . ENT: grossly tongue is normal . Neck: no obvious mass . Cardiovascular: S1 S2 normal no gallop . Respiratory: No rhonchi coarse breath sounds . Abdomen: soft . Skin: no rash seen on limited exam . Musculoskeletal: not rigid . Psychiatric:unable to assess . Neurologic: no seizure no involuntary movements         Lab Data:   Basic Metabolic Panel: Recent Labs  Lab 03/10/20 0443 03/12/20 0509 03/14/20 0604  NA 139 134* 135  K 3.7 3.9 3.7  CL 97* 93* 92*  CO2 32 30 33*  GLUCOSE 127* 112* 114*  BUN 40* 33* 31*  CREATININE 0.75 0.81 0.84  CALCIUM 8.7* 8.2* 8.4*  MG 2.1 2.1 2.1  PHOS  --  3.9 3.6    ABG: Recent Labs  Lab 03/15/20 1432  PHART 7.485*  PCO2ART 45.6  PO2ART 86.5  HCO3 33.8*  O2SAT 96.8    Liver Function Tests: No results for input(s): AST, ALT, ALKPHOS, BILITOT, PROT, ALBUMIN in the last 168 hours. No results for input(s): LIPASE, AMYLASE in the last 168 hours. No results for input(s): AMMONIA in the last 168 hours.  CBC: Recent Labs  Lab 03/10/20 0443 03/12/20 0509 03/14/20 0604  WBC 4.4 4.6 5.6  HGB 8.2* 8.1* 8.7*  HCT 27.2* 26.4* 28.0*  MCV 97.1 94.3 94.6  PLT 253 239 253    Cardiac Enzymes: No results for input(s): CKTOTAL,  CKMB, CKMBINDEX, TROPONINI in the last 168 hours.  BNP (last 3 results) Recent Labs    02/10/20 0355  BNP 675.6*    ProBNP (last 3 results) No results for input(s): PROBNP in the last 8760 hours.  Radiological Exams: No results found.  Assessment/Plan Active Problems:   Acute on chronic respiratory failure with hypoxia (HCC)   Cardiac arrest (HCC)   COPD, severe (HCC)   Coronary artery disease involving native coronary artery of native heart   Atrial fibrillation, rapid (HCC)   1. Acute on chronic respiratory failure with hypoxia we will continue with the T collar wean today will be completing 24 hours 2. Cardiac arrest rhythm is stable we will continue to follow 3. Severe COPD medical management 4. Coronary artery disease stable at this time 5. Atrial fibrillation rate is controlled we will continue to follow along   I have personally seen and evaluated the patient, evaluated laboratory and imaging results, formulated the assessment and plan and placed orders. The Patient requires high complexity decision making with multiple systems involvement.  Rounds were done with the Respiratory Therapy Director and Staff therapists and discussed with nursing staff also.  Yevonne Pax, MD [facility] Pulmonary Critical Care Medicine Sleep Medicine

## 2020-03-16 ENCOUNTER — Other Ambulatory Visit (HOSPITAL_COMMUNITY): Payer: Medicare HMO

## 2020-03-16 LAB — BASIC METABOLIC PANEL
Anion gap: 10 (ref 5–15)
BUN: 32 mg/dL — ABNORMAL HIGH (ref 8–23)
CO2: 34 mmol/L — ABNORMAL HIGH (ref 22–32)
Calcium: 8.7 mg/dL — ABNORMAL LOW (ref 8.9–10.3)
Chloride: 93 mmol/L — ABNORMAL LOW (ref 98–111)
Creatinine, Ser: 0.72 mg/dL (ref 0.61–1.24)
GFR calc Af Amer: >60 mL/min (ref >60)
GFR calc non Af Amer: >60 mL/min (ref >60)
Glucose, Bld: 132 mg/dL — ABNORMAL HIGH (ref 70–99)
Potassium: 3.6 mmol/L (ref 3.5–5.1)
Sodium: 137 mmol/L (ref 135–145)

## 2020-03-16 LAB — PHOSPHORUS: Phosphorus: 3.3 mg/dL (ref 2.5–4.6)

## 2020-03-16 LAB — CBC
HCT: 30.2 % — ABNORMAL LOW (ref 39.0–52.0)
Hemoglobin: 9.2 g/dL — ABNORMAL LOW (ref 13.0–17.0)
MCH: 28.2 pg (ref 26.0–34.0)
MCHC: 30.5 g/dL (ref 30.0–36.0)
MCV: 92.6 fL (ref 80.0–100.0)
Platelets: 256 10*3/uL (ref 150–400)
RBC: 3.26 MIL/uL — ABNORMAL LOW (ref 4.22–5.81)
RDW: 16.2 % — ABNORMAL HIGH (ref 11.5–15.5)
WBC: 6.5 10*3/uL (ref 4.0–10.5)
nRBC: 0 % (ref 0.0–0.2)

## 2020-03-16 LAB — MAGNESIUM: Magnesium: 2.1 mg/dL (ref 1.7–2.4)

## 2020-03-16 NOTE — Progress Notes (Addendum)
Pulmonary Critical Care Medicine [facility] [place]   PULMONARY CRITICAL CARE SERVICE  PROGRESS NOTE  Date of Service: 03/16/2020  Tony Munoz  [id number]  DOB: 09-Aug-1956   DOA: 03/04/2020  Referring Physician: Carron Curie, MD  HPI: Tony Munoz is a 64 y.o. male seen for follow up of Acute on Chronic Respiratory Failure.  Patient completed 48 hours on aerosol trach collar at this time currently on 28 percent satting well no distress.  Medications: Reviewed on Rounds  Physical Exam:  Vitals: Pulse 93 respirations 31 BP 137/76 O2 sat 94 percent temp 97.6  Ventilator Settings ATC 28 percent  . General: Comfortable at this time . Eyes: Grossly normal lids, irises & conjunctiva . ENT: grossly tongue is normal . Neck: no obvious mass . Cardiovascular: S1 S2 normal no gallop . Respiratory: No rales or rhonchi noted . Abdomen: soft . Skin: no rash seen on limited exam . Musculoskeletal: not rigid . Psychiatric:unable to assess . Neurologic: no seizure no involuntary movements         Lab Data:   Basic Metabolic Panel: Recent Labs  Lab 03/10/20 0443 03/12/20 0509 03/14/20 0604 03/16/20 0534  NA 139 134* 135 137  K 3.7 3.9 3.7 3.6  CL 97* 93* 92* 93*  CO2 32 30 33* 34*  GLUCOSE 127* 112* 114* 132*  BUN 40* 33* 31* 32*  CREATININE 0.75 0.81 0.84 0.72  CALCIUM 8.7* 8.2* 8.4* 8.7*  MG 2.1 2.1 2.1 2.1  PHOS  --  3.9 3.6 3.3    ABG: Recent Labs  Lab 03/15/20 1432  PHART 7.485*  PCO2ART 45.6  PO2ART 86.5  HCO3 33.8*  O2SAT 96.8    Liver Function Tests: No results for input(s): AST, ALT, ALKPHOS, BILITOT, PROT, ALBUMIN in the last 168 hours. No results for input(s): LIPASE, AMYLASE in the last 168 hours. No results for input(s): AMMONIA in the last 168 hours.  CBC: Recent Labs  Lab 03/10/20 0443 03/12/20 0509 03/14/20 0604 03/16/20 0534  WBC 4.4 4.6 5.6 6.5  HGB 8.2* 8.1* 8.7* 9.2*  HCT 27.2* 26.4* 28.0* 30.2*  MCV 97.1 94.3  94.6 92.6  PLT 253 239 253 256    Cardiac Enzymes: No results for input(s): CKTOTAL, CKMB, CKMBINDEX, TROPONINI in the last 168 hours.  BNP (last 3 results) Recent Labs    02/10/20 0355  BNP 675.6*    ProBNP (last 3 results) No results for input(s): PROBNP in the last 8760 hours.  Radiological Exams: DG CHEST PORT 1 VIEW  Result Date: 03/16/2020 CLINICAL DATA:  Pleural effusion. EXAM: PORTABLE CHEST 1 VIEW COMPARISON:  Chest x-ray dated 03/09/2020. FINDINGS: Stable LEFT pleural effusion, small to moderate in size. Associated atelectasis and/or edema within the mid and lower lung zones on the LEFT. Chronic scarring/atelectasis within the RIGHT mid and lower lung zones. Emphysematous blebs within the RIGHT upper lung zone, better demonstrated on earlier chest CT of 02/20/2020. Heart size and mediastinal contours are stable. Tracheostomy tube appears appropriately positioned. Enteric tube passes below the diaphragm, with tip at the level of the proximal stomach. IMPRESSION: 1. No new lung findings. Stable LEFT pleural effusion, small to moderate in size, with associated atelectasis and or edema. 2. Feeding tube with tip at the level of the proximal stomach. Consider advancing further into stomach for optimal radiographic appearance. Electronically Signed   By: Bary Richard M.D.   On: 03/16/2020 07:37   DG Abd Portable 1V  Result Date: 03/16/2020 CLINICAL DATA:  Enteric tube placement EXAM: PORTABLE ABDOMEN - 1 VIEW COMPARISON:  March 04, 2020 FINDINGS: Enteric tube tip is in the proximal stomach. There is fairly diffuse stool throughout the colon. There is no bowel dilatation or air-fluid level to suggest bowel obstruction. No free air. There is consolidation and effusion in the left lung base region. IMPRESSION: Enteric tube tip in proximal stomach. No bowel obstruction or free air. Fairly diffuse stool noted throughout colon. Effusion and consolidation visualized left lung base. Electronically  Signed   By: Bretta Bang III M.D.   On: 03/16/2020 11:39    Assessment/Plan Active Problems:   Acute on chronic respiratory failure with hypoxia (HCC)   Cardiac arrest (HCC)   COPD, severe (HCC)   Coronary artery disease involving native coronary artery of native heart   Atrial fibrillation, rapid (HCC)   1. Acute on chronic respiratory failure with hypoxia we will continue with the T collar wean today will be completing 48 hours.  Continue aggressive pulmonary toilet port measures 2. Cardiac arrest rhythm is stable we will continue to follow 3. Severe COPD medical management 4. Coronary artery disease stable at this time 5. Atrial fibrillation rate is controlled we will continue to follow along   I have personally seen and evaluated the patient, evaluated laboratory and imaging results, formulated the assessment and plan and placed orders. The Patient requires high complexity decision making with multiple systems involvement.  Rounds were done with the Respiratory Therapy Director and Staff therapists and discussed with nursing staff also.  Yevonne Pax, MD [facility] Pulmonary Critical Care Medicine Sleep Medicine

## 2020-03-17 LAB — CULTURE, RESPIRATORY W GRAM STAIN

## 2020-03-17 NOTE — Progress Notes (Signed)
Pulmonary Critical Care Medicine [facility] [place]   PULMONARY CRITICAL CARE SERVICE  PROGRESS NOTE  Date of Service: 03/17/2020  Tony Munoz  UJW:[id number]  DOB: 1955/09/24   DOA: 03/04/2020  Referring Physician: Carron Curie, MD  HPI: Tony Munoz is a 64 y.o. male seen for follow up of Acute on Chronic Respiratory Failure.  Patient is on T collar currently on 20% FiO2 with good saturations secretions are fair to moderate  Medications: Reviewed on Rounds  Physical Exam:  Vitals: Temperature is 99.8 pulse 103 respiratory rate 34 blood pressure is 130/66 saturations 94%  Ventilator Settings off ventilator on T collar FiO2 28%  . General: Comfortable at this time . Eyes: Grossly normal lids, irises & conjunctiva . ENT: grossly tongue is normal . Neck: no obvious mass . Cardiovascular: S1 S2 normal no gallop . Respiratory: No rhonchi no rales noted at this time . Abdomen: soft . Skin: no rash seen on limited exam . Musculoskeletal: not rigid . Psychiatric:unable to assess . Neurologic: no seizure no involuntary movements         Lab Data:   Basic Metabolic Panel: Recent Labs  Lab 03/12/20 0509 03/14/20 0604 03/16/20 0534  NA 134* 135 137  K 3.9 3.7 3.6  CL 93* 92* 93*  CO2 30 33* 34*  GLUCOSE 112* 114* 132*  BUN 33* 31* 32*  CREATININE 0.81 0.84 0.72  CALCIUM 8.2* 8.4* 8.7*  MG 2.1 2.1 2.1  PHOS 3.9 3.6 3.3    ABG: Recent Labs  Lab 03/15/20 1432  PHART 7.485*  PCO2ART 45.6  PO2ART 86.5  HCO3 33.8*  O2SAT 96.8    Liver Function Tests: No results for input(s): AST, ALT, ALKPHOS, BILITOT, PROT, ALBUMIN in the last 168 hours. No results for input(s): LIPASE, AMYLASE in the last 168 hours. No results for input(s): AMMONIA in the last 168 hours.  CBC: Recent Labs  Lab 03/12/20 0509 03/14/20 0604 03/16/20 0534  WBC 4.6 5.6 6.5  HGB 8.1* 8.7* 9.2*  HCT 26.4* 28.0* 30.2*  MCV 94.3 94.6 92.6  PLT 239 253 256    Cardiac  Enzymes: No results for input(s): CKTOTAL, CKMB, CKMBINDEX, TROPONINI in the last 168 hours.  BNP (last 3 results) Recent Labs    02/10/20 0355  BNP 675.6*    ProBNP (last 3 results) No results for input(s): PROBNP in the last 8760 hours.  Radiological Exams: DG CHEST PORT 1 VIEW  Result Date: 03/16/2020 CLINICAL DATA:  Pleural effusion. EXAM: PORTABLE CHEST 1 VIEW COMPARISON:  Chest x-ray dated 03/09/2020. FINDINGS: Stable LEFT pleural effusion, small to moderate in size. Associated atelectasis and/or edema within the mid and lower lung zones on the LEFT. Chronic scarring/atelectasis within the RIGHT mid and lower lung zones. Emphysematous blebs within the RIGHT upper lung zone, better demonstrated on earlier chest CT of 02/20/2020. Heart size and mediastinal contours are stable. Tracheostomy tube appears appropriately positioned. Enteric tube passes below the diaphragm, with tip at the level of the proximal stomach. IMPRESSION: 1. No new lung findings. Stable LEFT pleural effusion, small to moderate in size, with associated atelectasis and or edema. 2. Feeding tube with tip at the level of the proximal stomach. Consider advancing further into stomach for optimal radiographic appearance. Electronically Signed   By: Bary Richard M.D.   On: 03/16/2020 07:37   DG Abd Portable 1V  Result Date: 03/16/2020 CLINICAL DATA:  Enteric tube placement EXAM: PORTABLE ABDOMEN - 1 VIEW COMPARISON:  March 04, 2020  FINDINGS: Enteric tube tip is in the proximal stomach. There is fairly diffuse stool throughout the colon. There is no bowel dilatation or air-fluid level to suggest bowel obstruction. No free air. There is consolidation and effusion in the left lung base region. IMPRESSION: Enteric tube tip in proximal stomach. No bowel obstruction or free air. Fairly diffuse stool noted throughout colon. Effusion and consolidation visualized left lung base. Electronically Signed   By: Bretta Bang III M.D.   On:  03/16/2020 11:39    Assessment/Plan Active Problems:   Acute on chronic respiratory failure with hypoxia (HCC)   Cardiac arrest (HCC)   COPD, severe (HCC)   Coronary artery disease involving native coronary artery of native heart   Atrial fibrillation, rapid (HCC)   1. Acute on chronic respiratory failure hypoxia plan is to continue with T collar trials as ordered 2. Cardiac arrest rhythm has been stable we will monitor 3. Severe COPD at baseline 4. Coronary artery disease status post MVR repair and bypass 5. Atrial fibrillation rate controlled we will monitor   I have personally seen and evaluated the patient, evaluated laboratory and imaging results, formulated the assessment and plan and placed orders. The Patient requires high complexity decision making with multiple systems involvement.  Rounds were done with the Respiratory Therapy Director and Staff therapists and discussed with nursing staff also.  Yevonne Pax, MD [facility] Pulmonary Critical Care Medicine Sleep Medicine

## 2020-03-18 LAB — BASIC METABOLIC PANEL
Anion gap: 12 (ref 5–15)
BUN: 45 mg/dL — ABNORMAL HIGH (ref 8–23)
CO2: 32 mmol/L (ref 22–32)
Calcium: 8.6 mg/dL — ABNORMAL LOW (ref 8.9–10.3)
Chloride: 97 mmol/L — ABNORMAL LOW (ref 98–111)
Creatinine, Ser: 0.88 mg/dL (ref 0.61–1.24)
GFR calc Af Amer: >60 mL/min (ref >60)
GFR calc non Af Amer: >60 mL/min (ref >60)
Glucose, Bld: 152 mg/dL — ABNORMAL HIGH (ref 70–99)
Potassium: 3.6 mmol/L (ref 3.5–5.1)
Sodium: 141 mmol/L (ref 135–145)

## 2020-03-18 LAB — CBC
HCT: 28.5 % — ABNORMAL LOW (ref 39.0–52.0)
Hemoglobin: 8.7 g/dL — ABNORMAL LOW (ref 13.0–17.0)
MCH: 28.2 pg (ref 26.0–34.0)
MCHC: 30.5 g/dL (ref 30.0–36.0)
MCV: 92.2 fL (ref 80.0–100.0)
Platelets: 237 10*3/uL (ref 150–400)
RBC: 3.09 MIL/uL — ABNORMAL LOW (ref 4.22–5.81)
RDW: 16.6 % — ABNORMAL HIGH (ref 11.5–15.5)
WBC: 6.6 10*3/uL (ref 4.0–10.5)
nRBC: 0 % (ref 0.0–0.2)

## 2020-03-18 LAB — PHOSPHORUS: Phosphorus: 3.2 mg/dL (ref 2.5–4.6)

## 2020-03-18 LAB — URINE CULTURE: Culture: 50000 — AB

## 2020-03-18 LAB — MAGNESIUM: Magnesium: 1.9 mg/dL (ref 1.7–2.4)

## 2020-03-18 NOTE — Progress Notes (Signed)
Pulmonary Critical Care Medicine [facility] [place]   PULMONARY CRITICAL CARE SERVICE  PROGRESS NOTE  Date of Service: 03/18/2020  Tony Munoz  WNU:[id number]  DOB: 12/14/1955   DOA: 03/04/2020  Referring Physician: Carron Curie, MD  HPI: Tony Munoz is a 64 y.o. male seen for follow up of Acute on Chronic Respiratory Failure.  Patient currently is on T collar has been on 28% FiO2 has been noted to have increased secretions  Medications: Reviewed on Rounds  Physical Exam:  Vitals: Temperature is 98.2 pulse 88 respiratory rate 28 blood pressure is 121/63 saturations 97%  Ventilator Settings on T collar with an FiO2 of 28%  . General: Comfortable at this time . Eyes: Grossly normal lids, irises & conjunctiva . ENT: grossly tongue is normal . Neck: no obvious mass . Cardiovascular: S1 S2 normal no gallop . Respiratory: No rhonchi coarse breath sounds . Abdomen: soft . Skin: no rash seen on limited exam . Musculoskeletal: not rigid . Psychiatric:unable to assess . Neurologic: no seizure no involuntary movements         Lab Data:   Basic Metabolic Panel: Recent Labs  Lab 03/12/20 0509 03/14/20 0604 03/16/20 0534 03/18/20 1310  NA 134* 135 137 141  K 3.9 3.7 3.6 3.6  CL 93* 92* 93* 97*  CO2 30 33* 34* 32  GLUCOSE 112* 114* 132* 152*  BUN 33* 31* 32* 45*  CREATININE 0.81 0.84 0.72 0.88  CALCIUM 8.2* 8.4* 8.7* 8.6*  MG 2.1 2.1 2.1 1.9  PHOS 3.9 3.6 3.3 3.2    ABG: Recent Labs  Lab 03/15/20 1432  PHART 7.485*  PCO2ART 45.6  PO2ART 86.5  HCO3 33.8*  O2SAT 96.8    Liver Function Tests: No results for input(s): AST, ALT, ALKPHOS, BILITOT, PROT, ALBUMIN in the last 168 hours. No results for input(s): LIPASE, AMYLASE in the last 168 hours. No results for input(s): AMMONIA in the last 168 hours.  CBC: Recent Labs  Lab 03/12/20 0509 03/14/20 0604 03/16/20 0534 03/18/20 1310  WBC 4.6 5.6 6.5 6.6  HGB 8.1* 8.7* 9.2* 8.7*  HCT 26.4*  28.0* 30.2* 28.5*  MCV 94.3 94.6 92.6 92.2  PLT 239 253 256 237    Cardiac Enzymes: No results for input(s): CKTOTAL, CKMB, CKMBINDEX, TROPONINI in the last 168 hours.  BNP (last 3 results) Recent Labs    02/10/20 0355  BNP 675.6*    ProBNP (last 3 results) No results for input(s): PROBNP in the last 8760 hours.  Radiological Exams: No results found.  Assessment/Plan Active Problems:   Acute on chronic respiratory failure with hypoxia (HCC)   Cardiac arrest (HCC)   COPD, severe (HCC)   Coronary artery disease involving native coronary artery of native heart   Atrial fibrillation, rapid (HCC)   1. Acute on chronic respiratory failure hypoxia plan is to continue with T collar currently on 20% FiO2 continues to have significant copious secretions. 2. Cardiac arrest rhythm stable we will continue with monitoring 3. Severe COPD at baseline we will continue to follow 4. Coronary artery disease at baseline we will continue to follow 5. Atrial fibrillation rate is controlled continue with supportive care   I have personally seen and evaluated the patient, evaluated laboratory and imaging results, formulated the assessment and plan and placed orders. The Patient requires high complexity decision making with multiple systems involvement.  Rounds were done with the Respiratory Therapy Director and Staff therapists and discussed with nursing staff also.  Caileigh Canche A  Humphrey Rolls, MD [facility] Pulmonary Critical Care Medicine Sleep Medicine

## 2020-03-19 LAB — CBC
HCT: 28.6 % — ABNORMAL LOW (ref 39.0–52.0)
Hemoglobin: 8.5 g/dL — ABNORMAL LOW (ref 13.0–17.0)
MCH: 27.8 pg (ref 26.0–34.0)
MCHC: 29.7 g/dL — ABNORMAL LOW (ref 30.0–36.0)
MCV: 93.5 fL (ref 80.0–100.0)
Platelets: 219 10*3/uL (ref 150–400)
RBC: 3.06 MIL/uL — ABNORMAL LOW (ref 4.22–5.81)
RDW: 16.8 % — ABNORMAL HIGH (ref 11.5–15.5)
WBC: 5.1 10*3/uL (ref 4.0–10.5)
nRBC: 0 % (ref 0.0–0.2)

## 2020-03-19 LAB — BASIC METABOLIC PANEL
Anion gap: 12 (ref 5–15)
BUN: 38 mg/dL — ABNORMAL HIGH (ref 8–23)
CO2: 32 mmol/L (ref 22–32)
Calcium: 8.7 mg/dL — ABNORMAL LOW (ref 8.9–10.3)
Chloride: 98 mmol/L (ref 98–111)
Creatinine, Ser: 0.83 mg/dL (ref 0.61–1.24)
GFR calc Af Amer: >60 mL/min (ref >60)
GFR calc non Af Amer: >60 mL/min (ref >60)
Glucose, Bld: 113 mg/dL — ABNORMAL HIGH (ref 70–99)
Potassium: 3.7 mmol/L (ref 3.5–5.1)
Sodium: 142 mmol/L (ref 135–145)

## 2020-03-19 LAB — VANCOMYCIN, TROUGH: Vancomycin Tr: 11 ug/mL — ABNORMAL LOW (ref 15–20)

## 2020-03-19 LAB — MAGNESIUM: Magnesium: 2.3 mg/dL (ref 1.7–2.4)

## 2020-03-19 NOTE — Progress Notes (Signed)
Pulmonary Critical Care Medicine [facility] [place]   PULMONARY CRITICAL CARE SERVICE  PROGRESS NOTE  Date of Service: 03/19/2020  Tony Munoz  [id number]  DOB: 1956-04-17   DOA: 03/04/2020  Referring Physician: Carron Curie, MD  HPI: Tony Munoz is a 64 y.o. male seen for follow up of Acute on Chronic Respiratory Failure.  Patient is off ventilator on 28% FiO2 should be able to have the tracheostomy changed out today to a cuffless type trach  Medications: Reviewed on Rounds  Physical Exam:  Vitals: Temperature 99.7 pulse 88 respiratory rate 30 blood pressure is 114/59 saturations 98%  Ventilator Settings on trach collar FiO2 28%  . General: Comfortable at this time . Eyes: Grossly normal lids, irises & conjunctiva . ENT: grossly tongue is normal . Neck: no obvious mass . Cardiovascular: S1 S2 normal no gallop . Respiratory: Scattered rhonchi expansion is equal . Abdomen: soft . Skin: no rash seen on limited exam . Musculoskeletal: not rigid . Psychiatric:unable to assess . Neurologic: no seizure no involuntary movements         Lab Data:   Basic Metabolic Panel: Recent Labs  Lab 03/14/20 0604 03/16/20 0534 03/18/20 1310 03/19/20 0542  NA 135 137 141 142  K 3.7 3.6 3.6 3.7  CL 92* 93* 97* 98  CO2 33* 34* 32 32  GLUCOSE 114* 132* 152* 113*  BUN 31* 32* 45* 38*  CREATININE 0.84 0.72 0.88 0.83  CALCIUM 8.4* 8.7* 8.6* 8.7*  MG 2.1 2.1 1.9 2.3  PHOS 3.6 3.3 3.2  --     ABG: Recent Labs  Lab 03/15/20 1432  PHART 7.485*  PCO2ART 45.6  PO2ART 86.5  HCO3 33.8*  O2SAT 96.8    Liver Function Tests: No results for input(s): AST, ALT, ALKPHOS, BILITOT, PROT, ALBUMIN in the last 168 hours. No results for input(s): LIPASE, AMYLASE in the last 168 hours. No results for input(s): AMMONIA in the last 168 hours.  CBC: Recent Labs  Lab 03/14/20 0604 03/16/20 0534 03/18/20 1310 03/19/20 0542  WBC 5.6 6.5 6.6 5.1  HGB 8.7* 9.2* 8.7*  8.5*  HCT 28.0* 30.2* 28.5* 28.6*  MCV 94.6 92.6 92.2 93.5  PLT 253 256 237 219    Cardiac Enzymes: No results for input(s): CKTOTAL, CKMB, CKMBINDEX, TROPONINI in the last 168 hours.  BNP (last 3 results) Recent Labs    02/10/20 0355  BNP 675.6*    ProBNP (last 3 results) No results for input(s): PROBNP in the last 8760 hours.  Radiological Exams: No results found.  Assessment/Plan Active Problems:   Acute on chronic respiratory failure with hypoxia (HCC)   Cardiac arrest (HCC)   COPD, severe (HCC)   Coronary artery disease involving native coronary artery of native heart   Atrial fibrillation, rapid (HCC)   1. Acute on chronic respiratory failure with hypoxia patient continues on T collar on 28% FiO2 secretions are fair to moderate.  Tracheostomy will changed out to a cuffless trach today. 2. Cardiac arrest rhythm is stable we will continue with supportive care 3. Severe COPD at baseline medical management 4. Coronary artery disease no change continue with present management 5. Rapid atrial fibrillation rate is controlled   I have personally seen and evaluated the patient, evaluated laboratory and imaging results, formulated the assessment and plan and placed orders. The Patient requires high complexity decision making with multiple systems involvement.  Rounds were done with the Respiratory Therapy Director and Staff therapists and discussed with nursing staff  also.  Allyne Gee, MD [facility] Pulmonary Critical Care Medicine Sleep Medicine

## 2020-03-20 NOTE — Progress Notes (Addendum)
Pulmonary Critical Care Medicine [facility] [place]   PULMONARY CRITICAL CARE SERVICE  PROGRESS NOTE  Date of Service: 03/20/2020  Tony Munoz  [id number]:[id number]  DOB: [birth year]   DOA: 03/04/2020  Referring Physician: Carron Curie, MD  HPI: Tony Munoz is a 64 y.o. male seen for follow up of Acute on Chronic Respiratory Failure.  Patient mains on 20% aerosol trach collar continue to attempt inflating cuff on trach at this time satting well no distress.  Medications: Reviewed on Rounds  Physical Exam:  Vitals: Pulse 109 respirations 48 BP 130/72 O2 sat 95% temp 97.5  Ventilator Settings ATC 28%  . General: Comfortable at this time . Eyes: Grossly normal lids, irises & conjunctiva . ENT: grossly tongue is normal . Neck: no obvious mass . Cardiovascular: S1 S2 normal no gallop . Respiratory: No rales or rhonchi noted . Abdomen: soft . Skin: no rash seen on limited exam . Musculoskeletal: not rigid . Psychiatric:unable to assess . Neurologic: no seizure no involuntary movements         Lab Data:   Basic Metabolic Panel: Recent Labs  Lab 03/14/20 0604 03/16/20 0534 03/18/20 1310 03/19/20 0542  NA 135 137 141 142  K 3.7 3.6 3.6 3.7  CL 92* 93* 97* 98  CO2 33* 34* 32 32  GLUCOSE 114* 132* 152* 113*  BUN 31* 32* 45* 38*  CREATININE 0.84 0.72 0.88 0.83  CALCIUM 8.4* 8.7* 8.6* 8.7*  MG 2.1 2.1 1.9 2.3  PHOS 3.6 3.3 3.2  --     ABG: Recent Labs  Lab 03/15/20 1432  PHART 7.485*  PCO2ART 45.6  PO2ART 86.5  HCO3 33.8*  O2SAT 96.8    Liver Function Tests: No results for input(s): AST, ALT, ALKPHOS, BILITOT, PROT, ALBUMIN in the last 168 hours. No results for input(s): LIPASE, AMYLASE in the last 168 hours. No results for input(s): AMMONIA in the last 168 hours.  CBC: Recent Labs  Lab 03/14/20 0604 03/16/20 0534 03/18/20 1310 03/19/20 0542  WBC 5.6 6.5 6.6 5.1  HGB 8.7* 9.2* 8.7* 8.5*  HCT 28.0* 30.2* 28.5* 28.6*  MCV 94.6 92.6 92.2  93.5  PLT 253 256 237 219    Cardiac Enzymes: No results for input(s): CKTOTAL, CKMB, CKMBINDEX, TROPONINI in the last 168 hours.  BNP (last 3 results) Recent Labs    02/10/20 0355  BNP 675.6*    ProBNP (last 3 results) No results for input(s): PROBNP in the last 8760 hours.  Radiological Exams: No results found.  Assessment/Plan Active Problems:   Acute on chronic respiratory failure with hypoxia (HCC)   Cardiac arrest (HCC)   COPD, severe (HCC)   Coronary artery disease involving native coronary artery of native heart   Atrial fibrillation, rapid (HCC)   1. Acute on chronic respiratory failure with hypoxia patient continues on T collar on 28% FiO2 continue to attempt to change trach to a cuffless trach.  Continue respiratory toilet supportive measures. 2. Cardiac arrest rhythm is stable we will continue with supportive care 3. Severe COPD at baseline medical management 4. Coronary artery disease no change continue with present management 5. Rapid atrial fibrillation rate is controlled   I have personally seen and evaluated the patient, evaluated laboratory and imaging results, formulated the assessment and plan and placed orders. The Patient requires high complexity decision making with multiple systems involvement.  Rounds were done with the Respiratory Therapy Director and Staff therapists and discussed with nursing staff also.  Yevonne Pax,  MD [facility] Pulmonary Critical Care Medicine Sleep Medicine

## 2020-03-21 ENCOUNTER — Other Ambulatory Visit (HOSPITAL_COMMUNITY): Payer: Medicare HMO

## 2020-03-21 LAB — LACTIC ACID, PLASMA
Lactic Acid, Venous: 2.2 mmol/L (ref 0.5–1.9)
Lactic Acid, Venous: 2.7 mmol/L (ref 0.5–1.9)
Lactic Acid, Venous: 2.1 mmol/L (ref 0.5–1.9)

## 2020-03-21 LAB — BLOOD GAS, ARTERIAL
Acid-Base Excess: 5.1 mmol/L — ABNORMAL HIGH (ref 0.0–2.0)
Acid-Base Excess: 5.8 mmol/L — ABNORMAL HIGH (ref 0.0–2.0)
Bicarbonate: 31.9 mmol/L — ABNORMAL HIGH (ref 20.0–28.0)
Bicarbonate: 29.5 mmol/L — ABNORMAL HIGH (ref 20.0–28.0)
FIO2: 80
FIO2: 50
O2 Saturation: 88.4 %
O2 Saturation: 98.4 %
Patient temperature: 38.1
Patient temperature: 37.2
pCO2 arterial: 78.9 mmHg (ref 32.0–48.0)
pCO2 arterial: 41 mmHg (ref 32.0–48.0)
pH, Arterial: 7.237 — ABNORMAL LOW (ref 7.350–7.450)
pH, Arterial: 7.472 — ABNORMAL HIGH (ref 7.350–7.450)
pO2, Arterial: 70.6 mmHg — ABNORMAL LOW (ref 83.0–108.0)
pO2, Arterial: 109 mmHg — ABNORMAL HIGH (ref 83.0–108.0)

## 2020-03-21 LAB — CBC
HCT: 25.5 % — ABNORMAL LOW (ref 39.0–52.0)
Hemoglobin: 7.4 g/dL — ABNORMAL LOW (ref 13.0–17.0)
MCH: 27.3 pg (ref 26.0–34.0)
MCHC: 29 g/dL — ABNORMAL LOW (ref 30.0–36.0)
MCV: 94.1 fL (ref 80.0–100.0)
Platelets: 181 10*3/uL (ref 150–400)
RBC: 2.71 MIL/uL — ABNORMAL LOW (ref 4.22–5.81)
RDW: 17 % — ABNORMAL HIGH (ref 11.5–15.5)
WBC: 6.3 10*3/uL (ref 4.0–10.5)
nRBC: 0 % (ref 0.0–0.2)

## 2020-03-21 LAB — RENAL FUNCTION PANEL
Albumin: 1.6 g/dL — ABNORMAL LOW (ref 3.5–5.0)
Anion gap: 10 (ref 5–15)
BUN: 37 mg/dL — ABNORMAL HIGH (ref 8–23)
CO2: 29 mmol/L (ref 22–32)
Calcium: 8.1 mg/dL — ABNORMAL LOW (ref 8.9–10.3)
Chloride: 102 mmol/L (ref 98–111)
Creatinine, Ser: 1.08 mg/dL (ref 0.61–1.24)
GFR calc Af Amer: >60 mL/min (ref >60)
GFR calc non Af Amer: >60 mL/min (ref >60)
Glucose, Bld: 85 mg/dL (ref 70–99)
Phosphorus: 3 mg/dL (ref 2.5–4.6)
Potassium: 3.8 mmol/L (ref 3.5–5.1)
Sodium: 141 mmol/L (ref 135–145)

## 2020-03-21 LAB — OCCULT BLOOD X 1 CARD TO LAB, STOOL: Fecal Occult Bld: NEGATIVE

## 2020-03-21 LAB — VANCOMYCIN, TROUGH: Vancomycin Tr: 21 ug/mL (ref 15–20)

## 2020-03-21 LAB — MAGNESIUM: Magnesium: 2 mg/dL (ref 1.7–2.4)

## 2020-03-21 NOTE — Progress Notes (Addendum)
Pulmonary Critical Care Medicine [facility] [place]   PULMONARY CRITICAL CARE SERVICE  PROGRESS NOTE  Date of Service: 03/21/2020  Tony Munoz  VWU:[id number]  DOB: Oct 22, 1955   DOA: 03/04/2020  Referring Physician: Carron Curie, MD  HPI: Tony Munoz is a 64 y.o. male seen for follow up of Acute on Chronic Respiratory Failure.  Patient is on the ventilator right now on assist control mode has been having copious amounts of secretions.  Patient is requiring frequent suctioning.  Medications: Reviewed on Rounds  Physical Exam:  Vitals: Temperature 100.6 pulse 112 respiratory rate 33 blood pressure 135/71 saturations 90%  Ventilator Settings on assist control FiO2 is 50% tidal volume 596 PEEP 5  . General: Comfortable at this time . Eyes: Grossly normal lids, irises & conjunctiva . ENT: grossly tongue is normal . Neck: no obvious mass . Cardiovascular: S1 S2 normal no gallop . Respiratory: No rhonchi coarse breath . Abdomen: soft . Skin: no rash seen on limited exam . Musculoskeletal: not rigid . Psychiatric:unable to assess . Neurologic: no seizure no involuntary movements         Lab Data:   Basic Metabolic Panel: Recent Labs  Lab 03/16/20 0534 03/18/20 1310 03/19/20 0542 03/21/20 1015  NA 137 141 142 141  K 3.6 3.6 3.7 3.8  CL 93* 97* 98 102  CO2 34* 32 32 29  GLUCOSE 132* 152* 113* 85  BUN 32* 45* 38* 37*  CREATININE 0.72 0.88 0.83 1.08  CALCIUM 8.7* 8.6* 8.7* 8.1*  MG 2.1 1.9 2.3 2.0  PHOS 3.3 3.2  --  3.0    ABG: Recent Labs  Lab 03/15/20 1432 03/21/20 0430 03/21/20 1430  PHART 7.485* 7.237* 7.472*  PCO2ART 45.6 78.9* 41.0  PO2ART 86.5 70.6* 109*  HCO3 33.8* 31.9* 29.5*  O2SAT 96.8 88.4 98.4    Liver Function Tests: Recent Labs  Lab 03/21/20 1015  ALBUMIN 1.6*   No results for input(s): LIPASE, AMYLASE in the last 168 hours. No results for input(s): AMMONIA in the last 168 hours.  CBC: Recent Labs  Lab  03/16/20 0534 03/18/20 1310 03/19/20 0542 03/21/20 1015  WBC 6.5 6.6 5.1 6.3  HGB 9.2* 8.7* 8.5* 7.4*  HCT 30.2* 28.5* 28.6* 25.5*  MCV 92.6 92.2 93.5 94.1  PLT 256 237 219 181    Cardiac Enzymes: No results for input(s): CKTOTAL, CKMB, CKMBINDEX, TROPONINI in the last 168 hours.  BNP (last 3 results) Recent Labs    02/10/20 0355  BNP 675.6*    ProBNP (last 3 results) No results for input(s): PROBNP in the last 8760 hours.  Radiological Exams: DG Chest Port 1 View  Result Date: 03/21/2020 CLINICAL DATA:  Pneumonia. EXAM: PORTABLE CHEST 1 VIEW COMPARISON:  03/16/2020 FINDINGS: The tracheostomy tube is stable. The feeding tube is stable. Persistent and slight worsening bilateral infiltrates. Underlying advanced emphysematous changes. Persistent left effusion. No pneumothorax. IMPRESSION: Stable support apparatus. Persistent and slight worsening bilateral infiltrates. Electronically Signed   By: Rudie Meyer M.D.   On: 03/21/2020 04:59    Assessment/Plan Active Problems:   Acute on chronic respiratory failure with hypoxia (HCC)   Cardiac arrest (HCC)   COPD, severe (HCC)   Coronary artery disease involving native coronary artery of native heart   Atrial fibrillation, rapid (HCC)   1. Acute on chronic respiratory failure with hypoxia continue with full support on assist control mode only requiring 50% FiO2.  Tidal volume is adequate.  Patient however is having increase in  secretions.  Chest x-ray was done which showed persistence may be slight worsening of infiltrates.  Discussed with primary care team regarding antibiotics. 2. Cardiac arrest rhythm is stable we will monitor 3. Severe COPD at baseline we will continue with supportive care 4. Coronary artery disease no change no significant improvement 5. Atrial fibrillation currently rate controlled we will continue to follow along.   I have personally seen and evaluated the patient, evaluated laboratory and imaging  results, formulated the assessment and plan and placed orders. The Patient requires high complexity decision making with multiple systems involvement.  Rounds were done with the Respiratory Therapy Director and Staff therapists and discussed with nursing staff also.  Yevonne Pax, MD [facility] Pulmonary Critical Care Medicine Sleep Medicine

## 2020-03-22 LAB — CBC
HCT: 19.8 % — ABNORMAL LOW (ref 39.0–52.0)
HCT: 25.7 % — ABNORMAL LOW (ref 39.0–52.0)
Hemoglobin: 6.1 g/dL — CL (ref 13.0–17.0)
Hemoglobin: 7.7 g/dL — ABNORMAL LOW (ref 13.0–17.0)
MCH: 28.6 pg (ref 26.0–34.0)
MCH: 27.9 pg (ref 26.0–34.0)
MCHC: 30.8 g/dL (ref 30.0–36.0)
MCHC: 30 g/dL (ref 30.0–36.0)
MCV: 93 fL (ref 80.0–100.0)
MCV: 93.1 fL (ref 80.0–100.0)
Platelets: 150 10*3/uL (ref 150–400)
Platelets: 154 10*3/uL (ref 150–400)
RBC: 2.13 MIL/uL — ABNORMAL LOW (ref 4.22–5.81)
RBC: 2.76 MIL/uL — ABNORMAL LOW (ref 4.22–5.81)
RDW: 17.2 % — ABNORMAL HIGH (ref 11.5–15.5)
RDW: 16.7 % — ABNORMAL HIGH (ref 11.5–15.5)
WBC: 4.3 10*3/uL (ref 4.0–10.5)
WBC: 4.9 10*3/uL (ref 4.0–10.5)
nRBC: 0 % (ref 0.0–0.2)
nRBC: 0 % (ref 0.0–0.2)

## 2020-03-22 LAB — BASIC METABOLIC PANEL
Anion gap: 8 (ref 5–15)
BUN: 36 mg/dL — ABNORMAL HIGH (ref 8–23)
CO2: 27 mmol/L (ref 22–32)
Calcium: 7.8 mg/dL — ABNORMAL LOW (ref 8.9–10.3)
Chloride: 103 mmol/L (ref 98–111)
Creatinine, Ser: 1.14 mg/dL (ref 0.61–1.24)
GFR calc Af Amer: >60 mL/min (ref >60)
GFR calc non Af Amer: >60 mL/min (ref >60)
Glucose, Bld: 172 mg/dL — ABNORMAL HIGH (ref 70–99)
Potassium: 3.2 mmol/L — ABNORMAL LOW (ref 3.5–5.1)
Sodium: 138 mmol/L (ref 135–145)

## 2020-03-22 LAB — VANCOMYCIN, TROUGH: Vancomycin Tr: 8 ug/mL — ABNORMAL LOW (ref 15–20)

## 2020-03-22 LAB — LACTIC ACID, PLASMA: Lactic Acid, Venous: 1.8 mmol/L (ref 0.5–1.9)

## 2020-03-22 LAB — PREPARE RBC (CROSSMATCH)

## 2020-03-22 LAB — OCCULT BLOOD X 1 CARD TO LAB, STOOL: Fecal Occult Bld: NEGATIVE

## 2020-03-22 NOTE — Progress Notes (Addendum)
Pulmonary Critical Care Medicine [facility] [place]   PULMONARY CRITICAL CARE SERVICE  PROGRESS NOTE  Date of Service: 03/22/2020  Tony Munoz  AOZ:[id number]  DOB: Apr 28, 1956   DOA: 03/04/2020  Referring Physician: Carron Curie, MD  HPI: Tony Munoz is a 64 y.o. male seen for follow up of Acute on Chronic Respiratory Failure.  Patient remains on full support on the ventilator at this level rate of 20 with an FiO2 of 35% unable to wean today due to hemoglobin of 6.  Medications: Reviewed on Rounds  Physical Exam:  Vitals: Pulse 86 respirations 20 BP 89/55 O2 sat 97% temp 98.3  Ventilator Settings ventilator mode AC VC rate of 20 tidal volume 460 PEEP of 535%  . General: Comfortable at this time . Eyes: Grossly normal lids, irises & conjunctiva . ENT: grossly tongue is normal . Neck: no obvious mass . Cardiovascular: S1 S2 normal no gallop . Respiratory: No rales or rhonchi noted . Abdomen: soft . Skin: no rash seen on limited exam . Musculoskeletal: not rigid . Psychiatric:unable to assess . Neurologic: no seizure no involuntary movements         Lab Data:   Basic Metabolic Panel: Recent Labs  Lab 03/16/20 0534 03/18/20 1310 03/19/20 0542 03/21/20 1015 03/22/20 0541  NA 137 141 142 141 138  K 3.6 3.6 3.7 3.8 3.2*  CL 93* 97* 98 102 103  CO2 34* 32 32 29 27  GLUCOSE 132* 152* 113* 85 172*  BUN 32* 45* 38* 37* 36*  CREATININE 0.72 0.88 0.83 1.08 1.14  CALCIUM 8.7* 8.6* 8.7* 8.1* 7.8*  MG 2.1 1.9 2.3 2.0  --   PHOS 3.3 3.2  --  3.0  --     ABG: Recent Labs  Lab 03/15/20 1432 03/21/20 0430 03/21/20 1430  PHART 7.485* 7.237* 7.472*  PCO2ART 45.6 78.9* 41.0  PO2ART 86.5 70.6* 109*  HCO3 33.8* 31.9* 29.5*  O2SAT 96.8 88.4 98.4    Liver Function Tests: Recent Labs  Lab 03/21/20 1015  ALBUMIN 1.6*   No results for input(s): LIPASE, AMYLASE in the last 168 hours. No results for input(s): AMMONIA in the last 168  hours.  CBC: Recent Labs  Lab 03/16/20 0534 03/18/20 1310 03/19/20 0542 03/21/20 1015 03/22/20 0541  WBC 6.5 6.6 5.1 6.3 4.3  HGB 9.2* 8.7* 8.5* 7.4* 6.1*  HCT 30.2* 28.5* 28.6* 25.5* 19.8*  MCV 92.6 92.2 93.5 94.1 93.0  PLT 256 237 219 181 150    Cardiac Enzymes: No results for input(s): CKTOTAL, CKMB, CKMBINDEX, TROPONINI in the last 168 hours.  BNP (last 3 results) Recent Labs    02/10/20 0355  BNP 675.6*    ProBNP (last 3 results) No results for input(s): PROBNP in the last 8760 hours.  Radiological Exams: DG Chest Port 1 View  Result Date: 03/21/2020 CLINICAL DATA:  Pneumonia. EXAM: PORTABLE CHEST 1 VIEW COMPARISON:  03/16/2020 FINDINGS: The tracheostomy tube is stable. The feeding tube is stable. Persistent and slight worsening bilateral infiltrates. Underlying advanced emphysematous changes. Persistent left effusion. No pneumothorax. IMPRESSION: Stable support apparatus. Persistent and slight worsening bilateral infiltrates. Electronically Signed   By: Rudie Meyer M.D.   On: 03/21/2020 04:59    Assessment/Plan Active Problems:   Acute on chronic respiratory failure with hypoxia (HCC)   Cardiac arrest (HCC)   COPD, severe (HCC)   Coronary artery disease involving native coronary artery of native heart   Atrial fibrillation, rapid (HCC)   1. Acute on  chronic respiratory failure with hypoxia patient continue on full support at this time once hemoglobin is corrected patient will continue to attempt weaning.  Continue supportive measures monitor toilet. 2. Cardiac arrest rhythm is stable we will monitor 3. Severe COPD at baseline we will continue with supportive care 4. Coronary artery disease no change no significant improvement 5. Atrial fibrillation currently rate controlled we will continue to follow along.   I have personally seen and evaluated the patient, evaluated laboratory and imaging results, formulated the assessment and plan and placed orders. The  Patient requires high complexity decision making with multiple systems involvement.  Rounds were done with the Respiratory Therapy Director and Staff therapists and discussed with nursing staff also.  Yevonne Pax, MD [facility] Pulmonary Critical Care Medicine Sleep Medicine

## 2020-03-23 LAB — TYPE AND SCREEN
ABO/RH(D): A POS
Antibody Screen: NEGATIVE
Unit division: 0

## 2020-03-23 LAB — CULTURE, RESPIRATORY W GRAM STAIN: Culture: NORMAL

## 2020-03-23 LAB — BPAM RBC
Blood Product Expiration Date: 202108172359
ISSUE DATE / TIME: 202107161130
Unit Type and Rh: 6200

## 2020-03-23 LAB — BASIC METABOLIC PANEL
Anion gap: 9 (ref 5–15)
BUN: 45 mg/dL — ABNORMAL HIGH (ref 8–23)
CO2: 27 mmol/L (ref 22–32)
Calcium: 7.9 mg/dL — ABNORMAL LOW (ref 8.9–10.3)
Chloride: 102 mmol/L (ref 98–111)
Creatinine, Ser: 0.92 mg/dL (ref 0.61–1.24)
GFR calc Af Amer: >60 mL/min (ref >60)
GFR calc non Af Amer: >60 mL/min (ref >60)
Glucose, Bld: 126 mg/dL — ABNORMAL HIGH (ref 70–99)
Potassium: 4 mmol/L (ref 3.5–5.1)
Sodium: 138 mmol/L (ref 135–145)

## 2020-03-23 NOTE — Progress Notes (Signed)
Pulmonary Critical Care Medicine [facility] [place]   PULMONARY CRITICAL CARE SERVICE  PROGRESS NOTE  Date of Service: 03/23/2020  Tony Munoz  OJJ:[id number]  DOB: 1955/11/02   DOA: 03/04/2020  Referring Physician: Carron Curie, MD  HPI: Tony Munoz is a 64 y.o. male seen for follow up of Acute on Chronic Respiratory Failure.  Patient currently is on full support on the ventilator on assist control mode.  Respiratory therapy is going to check the RSB I weaning parameters if at all possible.  Yesterday patient was not able to wean because of low hemoglobin  Medications: Reviewed on Rounds  Physical Exam:  Vitals: Temperature 97.0 pulse 80 respiratory 21 blood pressure is 95/62 saturations 98%  Ventilator Settings on assist control FiO2 35% tidal volume 560 PEEP 5  . General: Comfortable at this time . Eyes: Grossly normal lids, irises & conjunctiva . ENT: grossly tongue is normal . Neck: no obvious mass . Cardiovascular: S1 S2 normal no gallop . Respiratory: No rhonchi no rales noted at this time . Abdomen: soft . Skin: no rash seen on limited exam . Musculoskeletal: not rigid . Psychiatric:unable to assess . Neurologic: no seizure no involuntary movements         Lab Data:   Basic Metabolic Panel: Recent Labs  Lab 03/18/20 1310 03/19/20 0542 03/21/20 1015 03/22/20 0541  NA 141 142 141 138  K 3.6 3.7 3.8 3.2*  CL 97* 98 102 103  CO2 32 32 29 27  GLUCOSE 152* 113* 85 172*  BUN 45* 38* 37* 36*  CREATININE 0.88 0.83 1.08 1.14  CALCIUM 8.6* 8.7* 8.1* 7.8*  MG 1.9 2.3 2.0  --   PHOS 3.2  --  3.0  --     ABG: Recent Labs  Lab 03/21/20 0430 03/21/20 1430  PHART 7.237* 7.472*  PCO2ART 78.9* 41.0  PO2ART 70.6* 109*  HCO3 31.9* 29.5*  O2SAT 88.4 98.4    Liver Function Tests: Recent Labs  Lab 03/21/20 1015  ALBUMIN 1.6*   No results for input(s): LIPASE, AMYLASE in the last 168 hours. No results for input(s): AMMONIA in the last 168  hours.  CBC: Recent Labs  Lab 03/18/20 1310 03/19/20 0542 03/21/20 1015 03/22/20 0541 03/22/20 1551  WBC 6.6 5.1 6.3 4.3 4.9  HGB 8.7* 8.5* 7.4* 6.1* 7.7*  HCT 28.5* 28.6* 25.5* 19.8* 25.7*  MCV 92.2 93.5 94.1 93.0 93.1  PLT 237 219 181 150 154    Cardiac Enzymes: No results for input(s): CKTOTAL, CKMB, CKMBINDEX, TROPONINI in the last 168 hours.  BNP (last 3 results) Recent Labs    02/10/20 0355  BNP 675.6*    ProBNP (last 3 results) No results for input(s): PROBNP in the last 8760 hours.  Radiological Exams: No results found.  Assessment/Plan Active Problems:   Acute on chronic respiratory failure with hypoxia (HCC)   Cardiac arrest (HCC)   COPD, severe (HCC)   Coronary artery disease involving native coronary artery of native heart   Atrial fibrillation, rapid (HCC)   1. Acute on chronic respiratory failure hypoxia patient continues on assist control FiO2 35% with tidal volume of 516 PEEP 5. 2. Cardiac arrest rhythm is stable we will continue to monitor 3. Severe COPD at baseline 4. Coronary artery disease no change continue with supportive care 5. Atrial fibrillation rate is controlled at this time   I have personally seen and evaluated the patient, evaluated laboratory and imaging results, formulated the assessment and plan and placed  orders. The Patient requires high complexity decision making with multiple systems involvement.  Rounds were done with the Respiratory Therapy Director and Staff therapists and discussed with nursing staff also.  Allyne Gee, MD [facility] Pulmonary Critical Care Medicine Sleep Medicine

## 2020-03-24 NOTE — Progress Notes (Addendum)
Pulmonary Critical Care Medicine [facility] [place]   PULMONARY CRITICAL CARE SERVICE  PROGRESS NOTE  Date of Service: 03/24/2020  Tony Munoz  [id number]:[id number]  DOB: 26-Oct-1955   DOA: 03/04/2020  Referring Physician: Carron Curie, MD  HPI: Tony Munoz is a 64 y.o. male seen for follow up of Acute on Chronic Respiratory Failure.  Patient has an 8-hour goal on pressure support today currently doing well has a large amount of secretions noted no fever or distress.  Medications: Reviewed on Rounds  Physical Exam:  Vitals: Pulse 104 respirations 26 BP 156/86 O2 sat he 5% temp 96.0  Ventilator Settings ventilator mode AC PC rate of 20 respiratory pressure of 22 PEEP of 5 and FiO2 of 35%  . General: Comfortable at this time . Eyes: Grossly normal lids, irises & conjunctiva . ENT: grossly tongue is normal . Neck: no obvious mass . Cardiovascular: S1 S2 normal no gallop . Respiratory: No rales or rhonchi noted . Abdomen: soft . Skin: no rash seen on limited exam . Musculoskeletal: not rigid . Psychiatric:unable to assess . Neurologic: no seizure no involuntary movements         Lab Data:   Basic Metabolic Panel: Recent Labs  Lab 03/18/20 1310 03/19/20 0542 03/21/20 1015 03/22/20 0541 03/23/20 1033  NA 141 142 141 138 138  K 3.6 3.7 3.8 3.2* 4.0  CL 97* 98 102 103 102  CO2 32 32 29 27 27   GLUCOSE 152* 113* 85 172* 126*  BUN 45* 38* 37* 36* 45*  CREATININE 0.88 0.83 1.08 1.14 0.92  CALCIUM 8.6* 8.7* 8.1* 7.8* 7.9*  MG 1.9 2.3 2.0  --   --   PHOS 3.2  --  3.0  --   --     ABG: Recent Labs  Lab 03/21/20 0430 03/21/20 1430  PHART 7.237* 7.472*  PCO2ART 78.9* 41.0  PO2ART 70.6* 109*  HCO3 31.9* 29.5*  O2SAT 88.4 98.4    Liver Function Tests: Recent Labs  Lab 03/21/20 1015  ALBUMIN 1.6*   No results for input(s): LIPASE, AMYLASE in the last 168 hours. No results for input(s): AMMONIA in the last 168 hours.  CBC: Recent Labs  Lab  03/18/20 1310 03/19/20 0542 03/21/20 1015 03/22/20 0541 03/22/20 1551  WBC 6.6 5.1 6.3 4.3 4.9  HGB 8.7* 8.5* 7.4* 6.1* 7.7*  HCT 28.5* 28.6* 25.5* 19.8* 25.7*  MCV 92.2 93.5 94.1 93.0 93.1  PLT 237 219 181 150 154    Cardiac Enzymes: No results for input(s): CKTOTAL, CKMB, CKMBINDEX, TROPONINI in the last 168 hours.  BNP (last 3 results) Recent Labs    02/10/20 0355  BNP 675.6*    ProBNP (last 3 results) No results for input(s): PROBNP in the last 8760 hours.  Radiological Exams: No results found.  Assessment/Plan Active Problems:   Acute on chronic respiratory failure with hypoxia (HCC)   Cardiac arrest (HCC)   COPD, severe (HCC)   Coronary artery disease involving native coronary artery of native heart   Atrial fibrillation, rapid (HCC)   1. Acute on chronic respiratory failure hypoxia we will continue to attempt weaning this time.  Continue supportive measures and pulmonary toilet. 2. Cardiac arrest rhythm is stable we will continue to follow 3. Severe COPD no change 4. Coronary artery disease patient is at baseline 5. Atrial fibrillation rate now rate controlled we will continue to monitor closely.   I have personally seen and evaluated the patient, evaluated laboratory and imaging results, formulated  the assessment and plan and placed orders. The Patient requires high complexity decision making with multiple systems involvement.  Rounds were done with the Respiratory Therapy Director and Staff therapists and discussed with nursing staff also.  Allyne Gee, MD [facility] Pulmonary Critical Care Medicine Sleep Medicine

## 2020-03-25 ENCOUNTER — Other Ambulatory Visit (HOSPITAL_COMMUNITY): Payer: Medicare HMO

## 2020-03-25 LAB — CBC
HCT: 23.9 % — ABNORMAL LOW (ref 39.0–52.0)
Hemoglobin: 7.2 g/dL — ABNORMAL LOW (ref 13.0–17.0)
MCH: 28.5 pg (ref 26.0–34.0)
MCHC: 30.1 g/dL (ref 30.0–36.0)
MCV: 94.5 fL (ref 80.0–100.0)
Platelets: 152 10*3/uL (ref 150–400)
RBC: 2.53 MIL/uL — ABNORMAL LOW (ref 4.22–5.81)
RDW: 17.2 % — ABNORMAL HIGH (ref 11.5–15.5)
WBC: 3.6 10*3/uL — ABNORMAL LOW (ref 4.0–10.5)
nRBC: 0 % (ref 0.0–0.2)

## 2020-03-25 LAB — CULTURE, BLOOD (ROUTINE X 2)
Culture: NO GROWTH
Culture: NO GROWTH

## 2020-03-25 LAB — BASIC METABOLIC PANEL
Anion gap: 7 (ref 5–15)
BUN: 36 mg/dL — ABNORMAL HIGH (ref 8–23)
CO2: 32 mmol/L (ref 22–32)
Calcium: 7.8 mg/dL — ABNORMAL LOW (ref 8.9–10.3)
Chloride: 97 mmol/L — ABNORMAL LOW (ref 98–111)
Creatinine, Ser: 0.79 mg/dL (ref 0.61–1.24)
GFR calc Af Amer: >60 mL/min (ref >60)
GFR calc non Af Amer: >60 mL/min (ref >60)
Glucose, Bld: 126 mg/dL — ABNORMAL HIGH (ref 70–99)
Potassium: 3.8 mmol/L (ref 3.5–5.1)
Sodium: 136 mmol/L (ref 135–145)

## 2020-03-25 NOTE — Progress Notes (Signed)
Pulmonary Critical Care Medicine [facility] [place]   PULMONARY CRITICAL CARE SERVICE  PROGRESS NOTE  Date of Service: 03/25/2020  Tony Munoz  ZOX:[id number]  DOB: May 02, 1956   DOA: 03/04/2020  Referring Physician: Carron Curie, MD  HPI: Tony Munoz is a 64 y.o. male seen for follow up of Acute on Chronic Respiratory Failure.  Patient currently is on pressure support has been on 50% FiO2 requiring 12/5 for the pressure.  Patient had a chest x-ray done which shows worsening of the left side probably has some fluid versus a small pneumothorax.  I spoke with the primary care team will get a CT scan of the chest done today.  Medications: Reviewed on Rounds  Physical Exam:  Vitals: Temperature 98.1 pulse 103 respiratory 28 blood pressure is 132/74 saturations 97%  Ventilator Settings on pressure support FiO2 is 50% pressure 12/5   General: Comfortable at this time  Eyes: Grossly normal lids, irises & conjunctiva  ENT: grossly tongue is normal  Neck: no obvious mass  Cardiovascular: S1 S2 normal no gallop  Respiratory: Scattered rhonchi expansion is adequate  Abdomen: soft  Skin: no rash seen on limited exam  Musculoskeletal: not rigid  Psychiatric:unable to assess  Neurologic: no seizure no involuntary movements         Lab Data:   Basic Metabolic Panel: Recent Labs  Lab 03/19/20 0542 03/21/20 1015 03/22/20 0541 03/23/20 1033 03/25/20 0700  NA 142 141 138 138 136  K 3.7 3.8 3.2* 4.0 3.8  CL 98 102 103 102 97*  CO2 32 29 27 27  32  GLUCOSE 113* 85 172* 126* 126*  BUN 38* 37* 36* 45* 36*  CREATININE 0.83 1.08 1.14 0.92 0.79  CALCIUM 8.7* 8.1* 7.8* 7.9* 7.8*  MG 2.3 2.0  --   --   --   PHOS  --  3.0  --   --   --     ABG: Recent Labs  Lab 03/21/20 0430 03/21/20 1430  PHART 7.237* 7.472*  PCO2ART 78.9* 41.0  PO2ART 70.6* 109*  HCO3 31.9* 29.5*  O2SAT 88.4 98.4    Liver Function Tests: Recent Labs  Lab 03/21/20 1015  ALBUMIN  1.6*   No results for input(s): LIPASE, AMYLASE in the last 168 hours. No results for input(s): AMMONIA in the last 168 hours.  CBC: Recent Labs  Lab 03/19/20 0542 03/21/20 1015 03/22/20 0541 03/22/20 1551 03/25/20 0700  WBC 5.1 6.3 4.3 4.9 3.6*  HGB 8.5* 7.4* 6.1* 7.7* 7.2*  HCT 28.6* 25.5* 19.8* 25.7* 23.9*  MCV 93.5 94.1 93.0 93.1 94.5  PLT 219 181 150 154 152    Cardiac Enzymes: No results for input(s): CKTOTAL, CKMB, CKMBINDEX, TROPONINI in the last 168 hours.  BNP (last 3 results) Recent Labs    02/10/20 0355  BNP 675.6*    ProBNP (last 3 results) No results for input(s): PROBNP in the last 8760 hours.  Radiological Exams: DG CHEST PORT 1 VIEW  Result Date: 03/25/2020 CLINICAL DATA:  64 year old male with history of pneumonia. EXAM: PORTABLE CHEST 1 VIEW COMPARISON:  Chest x-ray 03/21/2020. FINDINGS: Limited study which does not visualized the lung bases bilaterally. A tracheostomy tube is in place with tip 9.0 cm above the carina. A feeding tube is seen extending into the abdomen, however, the tip of the feeding tube extends below the lower margin of the image. Left-sided pleural effusion. There also may be a small left-sided pneumothorax (i.e., there is likely a left-sided hydropneumothorax). Multifocal  interstitial and airspace disease throughout the lungs bilaterally, with relative sparing of the right upper lobe. Overall, aeration is very similar to the recent prior study. Advanced emphysematous changes. Heart size is normal. Upper mediastinal contours are within normal limits. Status post median sternotomy. Orthopedic fixation hardware in the lower cervical spine incompletely imaged. IMPRESSION: 1. Postoperative changes and support apparatus, as above. 2. Probable left-sided hydropneumothorax with small pneumothorax component and moderate pleural effusion. 3. Severe multilobar bilateral pneumonia redemonstrated (left greater than right), similar to the prior study. 4.  Emphysema. These results will be called to the ordering clinician or representative by the Radiologist Assistant, and communication documented in the PACS or [employer]. Electronically Signed   By: Trudie Reed M.D.   On: 03/25/2020 12:49    Assessment/Plan Active Problems:   Acute on chronic respiratory failure with hypoxia (HCC)   Cardiac arrest (HCC)   COPD, severe (HCC)   Coronary artery disease involving native coronary artery of native heart   Atrial fibrillation, rapid (HCC)   1. Acute on chronic respiratory failure hypoxia we will continue with normal trying to wean on pressure support 12/5 however patient still has a worsening be left side.  We will get a CT scan of the chest to further assess there may also be an underlying pneumothorax. 2. Cardiac arrest rhythm is stable we will continue to follow 3. Severe COPD no change 4. Coronary artery disease patient is at baseline 5. Atrial fibrillation rate now rate controlled we will continue to monitor closely.   I have personally seen and evaluated the patient, evaluated laboratory and imaging results, formulated the assessment and plan and placed orders. The Patient requires high complexity decision making with multiple systems involvement.  Rounds were done with the Respiratory Therapy Director and Staff therapists and discussed with nursing staff also.  Yevonne Pax, MD [facility] Pulmonary Critical Care Medicine Sleep Medicine

## 2020-03-26 ENCOUNTER — Other Ambulatory Visit (HOSPITAL_COMMUNITY): Payer: Medicare HMO

## 2020-03-26 HISTORY — PX: IR THORACENTESIS ASP PLEURAL SPACE W/IMG GUIDE: IMG5380

## 2020-03-26 LAB — CBC
HCT: 26.6 % — ABNORMAL LOW (ref 39.0–52.0)
Hemoglobin: 8 g/dL — ABNORMAL LOW (ref 13.0–17.0)
MCH: 28.2 pg (ref 26.0–34.0)
MCHC: 30.1 g/dL (ref 30.0–36.0)
MCV: 93.7 fL (ref 80.0–100.0)
Platelets: 151 10*3/uL (ref 150–400)
RBC: 2.84 MIL/uL — ABNORMAL LOW (ref 4.22–5.81)
RDW: 17.2 % — ABNORMAL HIGH (ref 11.5–15.5)
WBC: 4.8 10*3/uL (ref 4.0–10.5)
nRBC: 0 % (ref 0.0–0.2)

## 2020-03-26 LAB — BASIC METABOLIC PANEL
Anion gap: 7 (ref 5–15)
BUN: 45 mg/dL — ABNORMAL HIGH (ref 8–23)
CO2: 36 mmol/L — ABNORMAL HIGH (ref 22–32)
Calcium: 8.3 mg/dL — ABNORMAL LOW (ref 8.9–10.3)
Chloride: 95 mmol/L — ABNORMAL LOW (ref 98–111)
Creatinine, Ser: 0.87 mg/dL (ref 0.61–1.24)
GFR calc Af Amer: >60 mL/min (ref >60)
GFR calc non Af Amer: >60 mL/min (ref >60)
Glucose, Bld: 97 mg/dL (ref 70–99)
Potassium: 4.1 mmol/L (ref 3.5–5.1)
Sodium: 138 mmol/L (ref 135–145)

## 2020-03-26 LAB — GRAM STAIN

## 2020-03-26 LAB — PHOSPHORUS: Phosphorus: 2.8 mg/dL (ref 2.5–4.6)

## 2020-03-26 LAB — TSH: TSH: 8.507 u[IU]/mL — ABNORMAL HIGH (ref 0.350–4.500)

## 2020-03-26 LAB — GLUCOSE, PLEURAL OR PERITONEAL FLUID: Glucose, Fluid: 137 mg/dL

## 2020-03-26 LAB — PROTEIN, TOTAL: Total Protein: 4.8 g/dL — ABNORMAL LOW (ref 6.5–8.1)

## 2020-03-26 LAB — MAGNESIUM: Magnesium: 2 mg/dL (ref 1.7–2.4)

## 2020-03-26 MED ORDER — LIDOCAINE HCL 1 % IJ SOLN
INTRAMUSCULAR | Status: AC
  Filled 2020-03-26: qty 20

## 2020-03-26 NOTE — Procedures (Signed)
PROCEDURE SUMMARY:  Successful image-guided right thoracentesis. Yielded 650 milliliters of clear gold fluid. Patient tolerated procedure well. No immediate complications. EBL = 0 mL.  Specimen was sent for labs. CXR ordered.  Please see imaging section of Epic for full dictation.   Gordy Councilman Adela Esteban PA-C 03/26/2020 1:37 PM

## 2020-03-26 NOTE — Progress Notes (Signed)
[facility]   PULMONARY CRITICAL [facility] SERVICE  PROGRESS NOTE  Date of Service: 03/26/2020  Tony Munoz  MVH:[id number]  DOB: 11-Oct-1955   DOA: 03/04/2020  Referring Physician: Carron CurieAli Hijazi, MD  HPI: Tony Munoz is a 64 y.o. male seen for follow up of Acute on Chronic Respiratory Failure.  Patient at this time is on pressure support yesterday was able to do 7.5 hours today we will try to do 12 hours as a goal again  Medications: Reviewed on Rounds  Physical Exam:  Vitals: Temperature is 97.8 pulse 100 respiratory 25 blood pressure is 147/81 saturations 100%  Ventilator Settings on pressure support FiO2 is 45% pressure support is 12/5  . General: Comfortable at this time . Eyes: Grossly normal lids, irises & conjunctiva . ENT: grossly tongue is normal . Neck: no obvious mass . Cardiovascular: S1 S2 normal no gallop . Respiratory: No rhonchi with rales . Abdomen: soft . Skin: no rash seen on limited exam . Musculoskeletal: not rigid . Psychiatric:unable to assess . Neurologic: no seizure no involuntary movements         Lab Data:   Basic Metabolic Panel: Recent Labs  Lab 03/21/20 1015 03/22/20 0541 03/23/20 1033 03/25/20 0700 03/26/20 0714  NA 141 138 138 136 138  K 3.8 3.2* 4.0 3.8 4.1  CL 102 103 102 97* 95*  CO2 29 27 27  32 36*  GLUCOSE 85 172* 126* 126* 97  BUN 37* 36* 45* 36* 45*  CREATININE 1.08 1.14 0.92 0.79 0.87  CALCIUM 8.1* 7.8* 7.9* 7.8* 8.3*  MG 2.0  --   --   --  2.0  PHOS 3.0  --   --   --  2.8    ABG: Recent Labs  Lab 03/21/20 0430 03/21/20 1430  PHART 7.237* 7.472*  PCO2ART 78.9* 41.0  PO2ART 70.6* 109*  HCO3 31.9* 29.5*  O2SAT 88.4 98.4    Liver Function Tests: Recent Labs  Lab 03/21/20 1015 03/26/20 1118  PROT  --  4.8*  ALBUMIN 1.6*  --    No results for input(s): LIPASE, AMYLASE in the last 168 hours. No results for input(s): AMMONIA in the last 168  hours.  CBC: Recent Labs  Lab 03/21/20 1015 03/22/20 0541 03/22/20 1551 03/25/20 0700 03/26/20 0714  WBC 6.3 4.3 4.9 3.6* 4.8  HGB 7.4* 6.1* 7.7* 7.2* 8.0*  HCT 25.5* 19.8* 25.7* 23.9* 26.6*  MCV 94.1 93.0 93.1 94.5 93.7  PLT 181 150 154 152 151    Cardiac Enzymes: No results for input(s): CKTOTAL, CKMB, CKMBINDEX, TROPONINI in the last 168 hours.  BNP (last 3 results) Recent Labs    02/10/20 0355  BNP 675.6*    ProBNP (last 3 results) No results for input(s): PROBNP in the last 8760 hours.  Radiological Exams: CT ABDOMEN WO CONTRAST  Result Date: 03/26/2020 CLINICAL DATA:  Evaluate gastric anatomy prior to potential percutaneous gastrostomy tube placement. EXAM: CT ABDOMEN WITHOUT CONTRAST TECHNIQUE: Multidetector CT imaging of the abdomen was performed following the standard protocol without IV contrast. COMPARISON:  Chest CT-earlier same day FINDINGS: The lack of intravenous contrast limits the ability to evaluate solid abdominal organs. Additionally, the examination is further degraded secondary to patient respiratory artifact. Lower chest: Please refer to dedicated chest CT performed earlier same day. Hepatobiliary: Normal hepatic contour. Normal noncontrast appearance of the gallbladder given degree distention and patient motion. No definite radiopaque gallstones. Pancreas: Normal noncontrast appearance of the pancreas. Spleen:  The spleen is enlarged measuring 17.7 cm in length (coronal image 69, series 7). Adrenals/Urinary Tract: Apparent high density crescentic fluid adjacent to the right kidney is favored to be artifactual due to patient motion though conceivably a perinephric hematoma could have a similar appearance. Normal noncontrast appearance of the left kidney. No renal stones. No urine obstruction or perinephric stranding. Normal noncontrast appearance the bilateral adrenal glands. The urinary bladder was not imaged. Stomach/Bowel: While there is partial interposition  of the left lobe of the liver adjacent to the anterior wall the stomach and ventral wall of the abdomen, however the percutaneous window to the stomach is felt to likely be improved with gastric insufflation. The transverse colon is noted to be redundant but without definitive interposition. Nonobstructive bowel gas pattern. No pneumoperitoneum, pneumatosis or portal venous gas. Vascular/Lymphatic: Irregular predominantly calcified atherosclerotic plaque within a mildly ectatic abdominal aorta which measures approximately 2.5 x 2.5 x 2.5 cm. Mild ectasia of the bilateral common iliac arteries with the right measuring 1.8 cm (image 101, series 3), and the left measuring 1.5 cm (image 100). No bulky retroperitoneal, mesenteric, pelvic or inguinal lymphadenopathy. Other: Moderate amount of subcutaneous edema about the midline of the low back. Musculoskeletal: No acute or aggressive osseous abnormalities. IMPRESSION: 1. Gastric anatomy amenable to percutaneous gastrostomy tube placement. As above, while there is partial interposition of the left lobe of the liver between the anterior wall of the stomach and ventral wall of the upper abdomen, the percutaneous window to the gastric lumen is felt to be likely improved with gastric insufflation. 2. Crescentic high density fluid adjacent to the right kidney is favored to be artifactual due to patient motion though conceivably a perinephric hematoma could have a similar appearance. Clinical correlation is advised. 3. Mild ectasia of the abdominal aorta measuring 2.5 cm in diameter. Recommend follow-up aortic ultrasound in 5 years. This recommendation follows ACR consensus guidelines: White Paper of the ACR Incidental Findings Committee II on Vascular Findings. J Am Coll Radiol 2013; 73:220-254. 4.  Aortic Atherosclerosis (ICD10-I70.0). 5. Nonspecific splenomegaly. 6. Please refer to dedicated chest CT performed earlier same day. Electronically Signed   By: Simonne Come M.D.    On: 03/26/2020 07:58   CT CHEST WO CONTRAST  Result Date: 03/25/2020 CLINICAL DATA:  Evaluate for pleural effusion EXAM: CT CHEST WITHOUT CONTRAST TECHNIQUE: Multidetector CT imaging of the chest was performed following the standard protocol without IV contrast. COMPARISON:  02/20/2020 CT, chest x-ray from earlier in the same day. FINDINGS: Cardiovascular: Thoracic aorta shows atherosclerotic calcifications without aneurysmal dilatation. The heart is not significantly enlarged. Decreased attenuation in the cardiac blood pool is noted suggestive of underlying anemia. Coronary calcifications are seen. Mediastinum/Nodes: Thoracic inlet demonstrates a tracheostomy tube in satisfactory position. No significant hilar or mediastinal adenopathy is noted. The esophagus is within normal limits. Gastric catheter is noted within the esophagus. Lungs/Pleura: Significant emphysematous changes are again identified similar to that seen on prior CT examination. Additionally there is lower lobe consolidation bilaterally with associated pleural effusions right slightly greater than left. The left-sided effusion also has some peripheral nodularity suggestive of empyema and possibly underlying neoplasm. Previously seen chest tube has been removed in the interval. No pneumothorax is seen. The suggestion of pneumothorax on prior chest x-ray is related to subpleural blebs. Musculoskeletal: Mild degenerative changes of thoracic spine is noted. Healing rib fractures are noted on the left better visualized on the current exam. IMPRESSION: Bilateral lower lobe consolidation with associated effusions. Changes suggestive of  empyema are noted on the left. No evidence of pneumothorax. Underlying emphysema. Aortic Atherosclerosis (ICD10-I70.0) and Emphysema (ICD10-J43.9). Electronically Signed   By: Alcide Clever M.D.   On: 03/25/2020 14:43   DG Chest Port 1 View  Result Date: 03/26/2020 CLINICAL DATA:  Post right-sided thoracentesis.  History of chronic respiratory failure, atrial fibrillation and COPD. EXAM: PORTABLE CHEST 1 VIEW COMPARISON:  Radiographs and chest CT 03/25/2020. FINDINGS: 1406 hours. Tracheostomy and feeding tube appear unchanged, although the tip of the latter is not visualized. The heart size and mediastinal contours are stable status post median sternotomy. The right-sided pleural effusion appears decreased in volume. Partially loculated left pleural effusion and left greater than right airspace opacities superimposed on underlying emphysema are unchanged. No evidence of pneumothorax. Previous lower cervical fusion noted. IMPRESSION: 1. Decreased volume of right pleural effusion status post thoracentesis. No evidence of pneumothorax. 2. Grossly unchanged left greater than right airspace opacities and pleural effusions superimposed on underlying emphysema. Electronically Signed   By: Carey Bullocks M.D.   On: 03/26/2020 14:41   DG CHEST PORT 1 VIEW  Result Date: 03/25/2020 CLINICAL DATA:  64 year old male with history of pneumonia. EXAM: PORTABLE CHEST 1 VIEW COMPARISON:  Chest x-ray 03/21/2020. FINDINGS: Limited study which does not visualized the lung bases bilaterally. A tracheostomy tube is in place with tip 9.0 cm above the carina. A feeding tube is seen extending into the abdomen, however, the tip of the feeding tube extends below the lower margin of the image. Left-sided pleural effusion. There also may be a small left-sided pneumothorax (i.e., there is likely a left-sided hydropneumothorax). Multifocal interstitial and airspace disease throughout the lungs bilaterally, with relative sparing of the right upper lobe. Overall, aeration is very similar to the recent prior study. Advanced emphysematous changes. Heart size is normal. Upper mediastinal contours are within normal limits. Status post median sternotomy. Orthopedic fixation hardware in the lower cervical spine incompletely imaged. IMPRESSION: 1.  Postoperative changes and support apparatus, as above. 2. Probable left-sided hydropneumothorax with small pneumothorax component and moderate pleural effusion. 3. Severe multilobar bilateral pneumonia redemonstrated (left greater than right), similar to the prior study. 4. Emphysema. These results will be called to the ordering clinician or representative by the Radiologist Assistant, and communication documented in the PACS or [employer]. Electronically Signed   By: Trudie Reed M.D.   On: 03/25/2020 12:49   IR THORACENTESIS ASP PLEURAL SPACE W/IMG GUIDE  Result Date: 03/26/2020 INDICATION: Patient with history of acute on chronic respiratory failure secondary to severe COPD s/p trach placement, dyspnea, and bilateral pleural effusions. Request is made for diagnostic and therapeutic right thoracentesis. EXAM: ULTRASOUND GUIDED DIAGNOSTIC AND THERAPEUTIC RIGHT THORACENTESIS MEDICATIONS: 8 mL 1% lidocaine COMPLICATIONS: None immediate. PROCEDURE: An ultrasound guided thoracentesis was thoroughly discussed with the patient and questions answered. The benefits, risks, alternatives and complications were also discussed. The patient understands and wishes to proceed with the procedure. Written consent was obtained. Ultrasound was performed to localize and mark an adequate pocket of fluid in the right chest. The area was then prepped and draped in the normal sterile fashion. 1% Lidocaine was used for local anesthesia. Under ultrasound guidance a 6 Fr Safe-T-Centesis catheter was introduced. Thoracentesis was performed. The catheter was removed and a dressing applied. FINDINGS: A total of approximately 650 mL of clear gold fluid was removed. Samples were sent to the laboratory as requested by the clinical team. IMPRESSION: Successful ultrasound guided right thoracentesis yielding 650 mL of pleural fluid. Read  by: Elwin Mocha, PA-C No pneumothorax on follow-up radiograph. Electronically Signed   By: Corlis Leak M.D.   On: 03/26/2020 14:54    Assessment/Plan Active Problems:   Acute on chronic respiratory failure with hypoxia (HCC)   Cardiac arrest (HCC)   COPD, severe (HCC)   Coronary artery disease involving native coronary artery of native heart   Atrial fibrillation, rapid (HCC)   1. Acute on chronic respiratory failure with hypoxia we will continue with pressure support titrate oxygen continue secretion management pulmonary toilet. 2. Cardiac arrest rhythm is stable at this time we will continue to follow along. 3. Coronary artery disease at baseline 4. Severe COPD medical management 5. Atrial fibrillation with rate control at this time we will continue to monitor closely.   I have personally seen and evaluated the patient, evaluated laboratory and imaging results, formulated the assessment and plan and placed orders. The Patient requires high complexity decision making with multiple systems involvement.  Rounds were done with the Respiratory Therapy Director and Staff therapists and discussed with nursing staff also.  Yevonne Pax, MD [facility] Pulmonary Critical Care Medicine Sleep Medicine

## 2020-03-26 NOTE — Consult Note (Signed)
Chief Complaint: Patient was seen in consultation today for percutaneous gastric tube placement at the request of Dr Ardeth Sportsman   Supervising Physician: Oley Balm  Patient Status: Select IP  History of Present Illness: Tony Munoz is a 64 y.o. male   CAD/CABG Acute on chronic Resp failure-- severe COPD Intubated after CABG- difficult weaning Respiratory failure exacerbation-- trach now  Deconditioning Failure to thrive Dysphagia Need or long term care  Request for percutaneous gastric tube placement LD Plavix today Will need off 5 days--- plan for G tube placement at least [facility] 7/26  Imaging reviewed with Dr Deanne Coffer-- he has approved anatomy for procedure Will need po Barium 2 days prior to procedure   CT Chest 7/19:  IMPRESSION: Bilateral lower lobe consolidation with associated effusions. Changes suggestive of empyema are noted on the left. No evidence of pneumothorax. Underlying emphysema.  [facility]- aware of CT reading-- pt is on antibiotic now May get repeat CT over weekend  Past Medical History:  Diagnosis Date  . Abnormal EKG   . Acute on chronic respiratory failure with hypoxia (HCC)   . Anxiety   . Atrial fibrillation, rapid (HCC)   . Bilateral carotid bruits 12/20/2019  . Cardiac arrest (HCC)   . Carotid artery occlusion   . Cigarette smoker 12/20/2019  . COPD, severe (HCC)   . Coronary artery disease   . Coronary artery disease involving native coronary artery of native heart   . DOE (dyspnea on exertion) 12/20/2019  . Dyspnea   . Essential hypertension 12/20/2019  . Prominent abdominal aortic pulsation 12/20/2019  . Radiculopathy 02/15/2019    Past Surgical History:  Procedure Laterality Date  . ANTERIOR CERVICAL DECOMPRESSION/DISCECTOMY FUSION 4 LEVELS Bilateral 02/15/2019   Procedure: ANTERIOR CERVICAL DECOMPRESSION FUSION, CERVICAL FOUR-FIVE, CERVICAL FIVE-SIX, CERVICAL SIX-SEVEN WITH INSTRUMENTATION AND ALLOGRAFT;  Surgeon:  Estill Bamberg, MD;  Location: [facility] OR;  Service: Orthopedics;  Laterality: Bilateral;  . BACK SURGERY    . CORONARY ARTERY BYPASS GRAFT N/A 02/06/2020   Procedure: CORONARY ARTERY BYPASS GRAFTING (CABG),  x Four with Bilateral IMAs, and right leg greater saphenous vein;  Surgeon: [name], MD;  Location: [facility] OR;  Service: Open Heart Surgery;  Laterality: N/A;  . ENDARTERECTOMY Left 02/06/2020   Procedure: CAROTID ENDARTERECTOMY LEFT with  Patch Angioplasty Using Hemashield Platinum Finesse patch;  Surgeon: Sherren Kerns, MD;  Location: [facility] OR;  Service: Vascular;  Laterality: Left;  . EYE SURGERY Bilateral    Cataracts  . HERNIA REPAIR     double hernia - 89yrs ago  . LEFT HEART CATH AND CORONARY ANGIOGRAPHY N/A 01/19/2020   Procedure: LEFT HEART CATH AND CORONARY ANGIOGRAPHY;  Surgeon: Lyn Records, MD;  Location: [facility] INVASIVE CV LAB;  Service: Cardiovascular;  Laterality: N/A;  . TEE WITHOUT CARDIOVERSION N/A 02/06/2020   Procedure: TRANSESOPHAGEAL ECHOCARDIOGRAM (TEE);  Surgeon: [name], MD;  Location: [facility] OR;  Service: Open Heart Surgery;  Laterality: N/A;  . TONSILLECTOMY      Allergies: Patient has no known allergies.  Medications: Prior to Admission medications   Medication Sig Start Date End Date Taking? Authorizing Provider  alum & mag hydroxide-simeth (MAALOX/MYLANTA) 200-200-20 MG/5ML suspension Place 15 mLs into feeding tube every 6 (six) hours as needed for indigestion or heartburn. 03/04/20   Leary Roca, PA-C  amiodarone (PACERONE) 200 MG tablet Place 1 tablet (200 mg total) into feeding tube 2 (two) times daily. 03/04/20   Leary Roca, PA-C  arformoterol (  BROVANA) 15 MCG/2ML NEBU Take 2 mLs (15 mcg total) by nebulization 2 (two) times daily. 03/04/20   Leary Roca, PA-C  aspirin 81 MG chewable tablet Place 1 tablet (81 mg total) into feeding tube daily. 03/05/20   Leary Roca, PA-C  atorvastatin (LIPITOR) 10 MG tablet Place 1  tablet (10 mg total) into feeding tube daily. 03/05/20   Leary Roca, PA-C  budesonide (PULMICORT) 0.25 MG/2ML nebulizer solution Take 2 mLs (0.25 mg total) by nebulization 2 (two) times daily. 03/04/20   Leary Roca, PA-C  citalopram (CELEXA) 20 MG tablet Place 1 tablet (20 mg total) into feeding tube daily. 03/05/20   Leary Roca, PA-C  clonazePAM (KLONOPIN) 2 MG tablet Place 1 tablet (2 mg total) into feeding tube 2 (two) times daily. 03/04/20   Leary Roca, PA-C  clopidogrel (PLAVIX) 75 MG tablet Place 1 tablet (75 mg total) into feeding tube daily. 03/05/20   Leary Roca, PA-C  collagenase (SANTYL) ointment Apply topically daily. 03/05/20   Leary Roca, PA-C  docusate (COLACE) 50 MG/5ML liquid Place 20 mLs (200 mg total) into feeding tube daily. 03/05/20   Leary Roca, PA-C  doxazosin (CARDURA) 4 MG tablet Take 1 tablet (4 mg total) by mouth daily. 03/04/20   Leary Roca, PA-C  enoxaparin (LOVENOX) 40 MG/0.4ML injection Inject 0.4 mLs (40 mg total) into the skin daily. 03/05/20   Leary Roca, PA-C  ferrous sulfate 300 (60 Fe) MG/5ML syrup Place 5 mLs (300 mg total) into feeding tube 2 (two) times daily with a meal. 03/04/20   [name], Cecille Amsterdam, PA-C  finasteride (PROSCAR) 5 MG tablet Take 1 tablet (5 mg total) by mouth daily. 03/05/20   Leary Roca, PA-C  folic acid (FOLVITE) 1 MG tablet Place 1 tablet (1 mg total) into feeding tube daily. 03/05/20   Leary Roca, PA-C  insulin aspart (NOVOLOG) 100 UNIT/ML injection Inject 0-24 Units into the skin every 4 (four) hours. 03/04/20   [name], Cecille Amsterdam, PA-C  melatonin 5 MG TABS Place 1 tablet (5 mg total) into feeding tube at bedtime as needed (insomnia). 03/04/20   Leary Roca, PA-C  midodrine (PROAMATINE) 5 MG tablet Place 1 tablet (5 mg total) into feeding tube 3 (three) times daily with meals. 03/04/20   Leary Roca, PA-C  Multiple Vitamin  (MULTIVITAMIN) LIQD Place 15 mLs into feeding tube daily. 03/05/20   Leary Roca, PA-C  nutrition supplement, JUVEN, (JUVEN) PACK Place 1 packet into feeding tube 2 (two) times daily between meals. 03/04/20   Leary Roca, PA-C  Nutritional Supplements (FEEDING SUPPLEMENT, VITAL AF 1.2 CAL,) LIQD Place 1,000 mLs into feeding tube continuous. 03/04/20   Leary Roca, PA-C  ondansetron (ZOFRAN) 4 MG/2ML SOLN injection Inject 2 mLs (4 mg total) into the vein every 6 (six) hours as needed for nausea or vomiting. 03/04/20   Leary Roca, PA-C  oxyCODONE (ROXICODONE) 5 MG/5ML solution Place 5 mLs (5 mg total) into feeding tube every 4 (four) hours as needed for severe pain. 03/04/20   Leary Roca, PA-C  pantoprazole sodium (PROTONIX) 40 mg/20 mL PACK Place 20 mLs (40 mg total) into feeding tube daily. 03/05/20   Leary Roca, PA-C  QUEtiapine (SEROQUEL) 25 MG tablet Place 1 tablet (25 mg total) into feeding tube 2 (two) times daily. 03/04/20   Leary Roca, PA-C  revefenacin (YUPELRI) 175 MCG/3ML nebulizer solution Take 3 mLs (175  mcg total) by nebulization daily. 03/05/20   Leary Roca, PA-C  sodium chloride flush (NS) 0.9 % SOLN 10-40 mLs by Intracatheter route every 12 (twelve) hours. 03/04/20   Leary Roca, PA-C  sodium chloride flush (NS) 0.9 % SOLN 10-40 mLs by Intracatheter route as needed (flush). 03/04/20   Leary Roca, PA-C  thiamine 100 MG tablet Place 1 tablet (100 mg total) into feeding tube daily. 03/05/20   Leary Roca, PA-C     No family history on file.  Social History   Socioeconomic History  . Marital status: Single    Spouse name: Not on file  . Number of children: Not on file  . Years of education: Not on file  . Highest education level: Not on file  Occupational History  . Not on file  Tobacco Use  . Smoking status: Current Every Day Smoker    Packs/day: 1.50    Years: 50.00    Pack years:  75.00    Types: Cigarettes  . Smokeless tobacco: Never Used  Vaping Use  . Vaping Use: Never used  Substance and Sexual Activity  . Alcohol use: Yes    Comment: on occassion  . Drug use: Never  . Sexual activity: Not on file  Other Topics Concern  . Not on file  Social History Narrative  . Not on file   Social Determinants of Health   Financial Resource Strain:   . Difficulty of Paying Living Expenses:   Food Insecurity:   . Worried About Programme researcher, broadcasting/film/video in the Last Year:   . Barista in the Last Year:   Transportation Needs:   . Freight forwarder (Medical):   [name] Lack of Transportation (Non-Medical):   Physical Activity:   . Days of Exercise per Week:   . Minutes of Exercise per Session:   Stress:   . Feeling of Stress :   Social Connections:   . Frequency of Communication with Friends and Family:   . Frequency of Social Gatherings with Friends and Family:   . Attends Religious Services:   . Active Member of Clubs or Organizations:   . Attends Banker Meetings:   [name] Marital Status:     Review of Systems: A 12 point ROS discussed and pertinent positives are indicated in the HPI above.  All other systems are negative.   Vital Signs: There were no vitals taken for this visit.  Physical Exam Vitals reviewed.  Cardiovascular:     Rate and Rhythm: Normal rate and regular rhythm.     Heart sounds: Normal heart sounds.  Pulmonary:     Breath sounds: Rhonchi present.  Abdominal:     General: Bowel sounds are normal.     Tenderness: There is no abdominal tenderness.  Musculoskeletal:     Comments: follows all commands  Neurological:     Mental Status: He is alert.     Comments: Non verbal Seems to understand G tube intention and is agreeable Nods head yes/no appropriately  Psychiatric:     Comments: Spoke to [employer] via phone He consents for procedure-- signed consent via phone     Imaging: CT ABDOMEN WO  CONTRAST  Result Date: 03/26/2020 CLINICAL DATA:  Evaluate gastric anatomy prior to potential percutaneous gastrostomy tube placement. EXAM: CT ABDOMEN WITHOUT CONTRAST TECHNIQUE: Multidetector CT imaging of the abdomen was performed following the standard protocol without IV contrast. COMPARISON:  Chest CT-earlier same day FINDINGS:  The lack of intravenous contrast limits the ability to evaluate solid abdominal organs. Additionally, the examination is further degraded secondary to patient respiratory artifact. Lower chest: Please refer to dedicated chest CT performed earlier same day. Hepatobiliary: Normal hepatic contour. Normal noncontrast appearance of the gallbladder given degree distention and patient motion. No definite radiopaque gallstones. Pancreas: Normal noncontrast appearance of the pancreas. Spleen: The spleen is enlarged measuring 17.7 cm in length (coronal image 69, series 7). Adrenals/Urinary Tract: Apparent high density crescentic fluid adjacent to the right kidney is favored to be artifactual due to patient motion though conceivably a perinephric hematoma could have a similar appearance. Normal noncontrast appearance of the left kidney. No renal stones. No urine obstruction or perinephric stranding. Normal noncontrast appearance the bilateral adrenal glands. The urinary bladder was not imaged. Stomach/Bowel: While there is partial interposition of the left lobe of the liver adjacent to the anterior wall the stomach and ventral wall of the abdomen, however the percutaneous window to the stomach is felt to likely be improved with gastric insufflation. The transverse colon is noted to be redundant but without definitive interposition. Nonobstructive bowel gas pattern. No pneumoperitoneum, pneumatosis or portal venous gas. Vascular/Lymphatic: Irregular predominantly calcified atherosclerotic plaque within a mildly ectatic abdominal aorta which measures approximately 2.5 x 2.5 x 2.5 cm. Mild ectasia  of the bilateral common iliac arteries with the right measuring 1.8 cm (image 101, series 3), and the left measuring 1.5 cm (image 100). No bulky retroperitoneal, mesenteric, pelvic or inguinal lymphadenopathy. Other: Moderate amount of subcutaneous edema about the midline of the low back. Musculoskeletal: No acute or aggressive osseous abnormalities. IMPRESSION: 1. Gastric anatomy amenable to percutaneous gastrostomy tube placement. As above, while there is partial interposition of the left lobe of the liver between the anterior wall of the stomach and ventral wall of the upper abdomen, the percutaneous window to the gastric lumen is felt to be likely improved with gastric insufflation. 2. Crescentic high density fluid adjacent to the right kidney is favored to be artifactual due to patient motion though conceivably a perinephric hematoma could have a similar appearance. Clinical correlation is advised. 3. Mild ectasia of the abdominal aorta measuring 2.5 cm in diameter. Recommend follow-up aortic ultrasound in 5 years. This recommendation follows ACR consensus guidelines: White Paper of the ACR Incidental Findings Committee II on Vascular Findings. J Am Coll Radiol 2013; 30:076-226. 4.  Aortic Atherosclerosis (ICD10-I70.0). 5. Nonspecific splenomegaly. 6. Please refer to dedicated chest CT performed earlier same day. Electronically Signed   By: Simonne Come M.D.   On: 03/26/2020 07:58   DG Chest 1 View  Result Date: 02/27/2020 CLINICAL DATA:  Respiratory failure EXAM: CHEST  1 VIEW COMPARISON:  02/26/2020 FINDINGS: Tracheostomy tube in satisfactory position. Feeding tube coursing below the diaphragm. Bilateral chest tubes in satisfactory unchanged position. Bilateral diffuse interstitial and alveolar airspace opacities. No definite pleural effusion. Bilateral emphysematous changes with bulla in the right upper lobe. Stable cardiomediastinal silhouette. Prior CABG. IMPRESSION: 1. Support lines and tubing in  satisfactory position. 2. Bilateral diffuse interstitial and alveolar airspace opacities concerning for multilobar pneumonia. No significant interval change. Electronically Signed   By: Elige Ko   On: 02/27/2020 14:47   CT CHEST WO CONTRAST  Result Date: 03/25/2020 CLINICAL DATA:  Evaluate for pleural effusion EXAM: CT CHEST WITHOUT CONTRAST TECHNIQUE: Multidetector CT imaging of the chest was performed following the standard protocol without IV contrast. COMPARISON:  02/20/2020 CT, chest x-ray from earlier in the same day. FINDINGS:  Cardiovascular: Thoracic aorta shows atherosclerotic calcifications without aneurysmal dilatation. The heart is not significantly enlarged. Decreased attenuation in the cardiac blood pool is noted suggestive of underlying anemia. Coronary calcifications are seen. Mediastinum/Nodes: Thoracic inlet demonstrates a tracheostomy tube in satisfactory position. No significant hilar or mediastinal adenopathy is noted. The esophagus is within normal limits. Gastric catheter is noted within the esophagus. Lungs/Pleura: Significant emphysematous changes are again identified similar to that seen on prior CT examination. Additionally there is lower lobe consolidation bilaterally with associated pleural effusions right slightly greater than left. The left-sided effusion also has some peripheral nodularity suggestive of empyema and possibly underlying neoplasm. Previously seen chest tube has been removed in the interval. No pneumothorax is seen. The suggestion of pneumothorax on prior chest x-ray is related to subpleural blebs. Musculoskeletal: Mild degenerative changes of thoracic spine is noted. Healing rib fractures are noted on the left better visualized on the current exam. IMPRESSION: Bilateral lower lobe consolidation with associated effusions. Changes suggestive of empyema are noted on the left. No evidence of pneumothorax. Underlying emphysema. Aortic Atherosclerosis (ICD10-I70.0) and  Emphysema (ICD10-J43.9). Electronically Signed   By: Alcide Clever M.D.   On: 03/25/2020 14:43   DG CHEST PORT 1 VIEW  Result Date: 03/25/2020 CLINICAL DATA:  64 year old male with history of pneumonia. EXAM: PORTABLE CHEST 1 VIEW COMPARISON:  Chest x-ray 03/21/2020. FINDINGS: Limited study which does not visualized the lung bases bilaterally. A tracheostomy tube is in place with tip 9.0 cm above the carina. A feeding tube is seen extending into the abdomen, however, the tip of the feeding tube extends below the lower margin of the image. Left-sided pleural effusion. There also may be a small left-sided pneumothorax (i.e., there is likely a left-sided hydropneumothorax). Multifocal interstitial and airspace disease throughout the lungs bilaterally, with relative sparing of the right upper lobe. Overall, aeration is very similar to the recent prior study. Advanced emphysematous changes. Heart size is normal. Upper mediastinal contours are within normal limits. Status post median sternotomy. Orthopedic fixation hardware in the lower cervical spine incompletely imaged. IMPRESSION: 1. Postoperative changes and support apparatus, as above. 2. Probable left-sided hydropneumothorax with small pneumothorax component and moderate pleural effusion. 3. Severe multilobar bilateral pneumonia redemonstrated (left greater than right), similar to the prior study. 4. Emphysema. These results will be called to the ordering clinician or representative by the Radiologist Assistant, and communication documented in the PACS or [employer]. Electronically Signed   By: Trudie Reed M.D.   On: 03/25/2020 12:49   DG Chest Port 1 View  Result Date: 03/21/2020 CLINICAL DATA:  Pneumonia. EXAM: PORTABLE CHEST 1 VIEW COMPARISON:  03/16/2020 FINDINGS: The tracheostomy tube is stable. The feeding tube is stable. Persistent and slight worsening bilateral infiltrates. Underlying advanced emphysematous changes. Persistent left  effusion. No pneumothorax. IMPRESSION: Stable support apparatus. Persistent and slight worsening bilateral infiltrates. Electronically Signed   By: Rudie Meyer M.D.   On: 03/21/2020 04:59   DG CHEST PORT 1 VIEW  Result Date: 03/16/2020 CLINICAL DATA:  Pleural effusion. EXAM: PORTABLE CHEST 1 VIEW COMPARISON:  Chest x-ray dated 03/09/2020. FINDINGS: Stable LEFT pleural effusion, small to moderate in size. Associated atelectasis and/or edema within the mid and lower lung zones on the LEFT. Chronic scarring/atelectasis within the RIGHT mid and lower lung zones. Emphysematous blebs within the RIGHT upper lung zone, better demonstrated on earlier chest CT of 02/20/2020. Heart size and mediastinal contours are stable. Tracheostomy tube appears appropriately positioned. Enteric tube passes below the diaphragm, with  tip at the level of the proximal stomach. IMPRESSION: 1. No new lung findings. Stable LEFT pleural effusion, small to moderate in size, with associated atelectasis and or edema. 2. Feeding tube with tip at the level of the proximal stomach. Consider advancing further into stomach for optimal radiographic appearance. Electronically Signed   By: Bary Richard M.D.   On: 03/16/2020 07:37   DG Chest Port 1 View  Result Date: 03/09/2020 CLINICAL DATA:  Edema. EXAM: PORTABLE CHEST 1 VIEW COMPARISON:  02/07/2020 FINDINGS: Tracheostomy tube is unchanged. Feeding tube is in place, tip to level of the proximal stomach. Status post median sternotomy. Heterogeneous pulmonary infiltrates are again noted throughout the lungs, LEFT greater than RIGHT. LEFT pleural effusion is similar to prior. Emphysematous changes are seen in the apices, RIGHT greater than LEFT and stable in appearance. IMPRESSION: 1. Stable appearance of the chest. 2. Feeding tube to the proximal stomach. Consider advancing further into the stomach. Electronically Signed   By: Norva Pavlov M.D.   On: 03/09/2020 16:30   DG CHEST PORT 1  VIEW  Result Date: 03/08/2020 CLINICAL DATA:  Pneumonia EXAM: PORTABLE CHEST 1 VIEW COMPARISON:  03/05/2020 FINDINGS: Interval increase in heterogeneous and interstitial airspace opacity of the left greater than right lungs, with emphysematous change of the right pulmonary apex. Probable layering pleural effusions. Tracheostomy. Status post median sternotomy. IMPRESSION: Interval increase in heterogeneous and interstitial airspace opacity of the left greater than right lungs, with emphysematous change of the right pulmonary apex. Probable layering pleural effusions. Findings are consistent with worsened multifocal infection and/or edema. Electronically Signed   By: Lauralyn Primes M.D.   On: 03/08/2020 14:02   DG CHEST PORT 1 VIEW  Result Date: 03/05/2020 CLINICAL DATA:  Tracheostomy.  Ventilator EXAM: PORTABLE CHEST 1 VIEW COMPARISON:  Three days ago FINDINGS: Increased opacity on the left at least partially from pleural fluid. There is also bilateral airspace disease superimposed on emphysema. Tracheostomy tube in place. Feeding tube with tip at the proximal stomach. Right PICC with tip at the SVC. IMPRESSION: Worsening opacity on the left at least partially from pleural fluid. Electronically Signed   By: Marnee Spring M.D.   On: 03/05/2020 06:49   DG Chest Port 1 View  Result Date: 03/02/2020 CLINICAL DATA:  History of open heart surgery. Evaluate for pneumothorax. EXAM: PORTABLE CHEST 1 VIEW COMPARISON:  03/01/2020 FINDINGS: Bilateral chest tubes have been removed. Negative for a pneumothorax. Again noted is marked lucency in the right upper lung compatible with emphysematous changes. Patchy parenchymal lung densities are unchanged. Patient has a tracheostomy tube. Feeding tube extends into the abdomen but the tip is beyond the image. Right arm PICC line tip in the SVC. Heart size is stable. Again noted are median sternotomy wires. IMPRESSION: 1. Removal of bilateral chest tubes without a pneumothorax.  2. No significant change in the bilateral parenchymal lung disease. 3. Chronic lung changes with bulla emphysematous disease in the right upper lung. Electronically Signed   By: Richarda Overlie M.D.   On: 03/02/2020 09:21   DG Chest Port 1 View  Result Date: 03/01/2020 CLINICAL DATA:  Tracheostomy and chest tube, recent cardiac surgery EXAM: PORTABLE CHEST 1 VIEW COMPARISON:  Radiograph 02/28/2020 FINDINGS: Tracheostomy tube terminates in the upper trachea, 8 0.2 cm from the carina. A transesophageal tube tip terminates below the level of the image, beyond the GE junction. Bilateral chest tubes remain in place. Right upper extremity PICC tip terminates in the lower SVC. Telemetry leads,  cannula and ventilator tubing overlie the chest. Persistent heterogeneous opacities in the mid to lower lung superimposed on more emphysematous changes better seen on comparison exam. Right greater than left apical bullous disease is again seen. Small bilateral pleural effusions are present. No discernible pneumothorax. IMPRESSION: 1. Lines and tubes as above. 2. Stable persistent heterogeneous opacities most pronounced in the in the mid to lower lung superimposed on chronic emphysematous changes and apical bullous disease. 3. Small bilateral pleural effusions. Electronically Signed   By: Kreg ShropshirePrice  DeHay M.D.   On: 03/01/2020 06:37   DG Chest Port 1 View  Result Date: 02/28/2020 CLINICAL DATA:  Follow-up pneumothorax EXAM: PORTABLE CHEST 1 VIEW COMPARISON:  02/27/2020 FINDINGS: Cardiac shadow is within normal limits. Bilateral chest tubes are noted. No pneumothorax is seen. Emphysematous changes are noted. Patchy opacities are noted in both lungs. No bony abnormality is noted. Tracheostomy tube and right-sided PICC line as well as feeding catheter are again seen and stable. IMPRESSION: No evidence of pneumothorax. Patchy airspace opacities bilaterally stable from the prior exam. Electronically Signed   By: Alcide CleverMark  Lukens M.D.   On:  02/28/2020 15:40   DG Chest Port 1 View  Result Date: 02/26/2020 CLINICAL DATA:  Tracheostomy tube in place.  Respiratory failure. EXAM: PORTABLE CHEST 1 VIEW COMPARISON:  One-view chest x-ray 02/25/2020 FINDINGS: Heart is enlarged. Tracheostomy tube is stable. Feeding tube courses off the inferior border the film. Right PICC line is stable. Left lower lobe airspace disease not significantly changed. Right upper lobe airspace disease is slightly improved. Changes of COPD are again noted. IMPRESSION: 1. Stable left lower lobe airspace disease. 2. Improving right upper lobe airspace disease. 3. Stable cardiomegaly. Electronically Signed   By: Marin Robertshristopher  Mattern M.D.   On: 02/26/2020 08:25   DG Abd Portable 1V  Result Date: 03/16/2020 CLINICAL DATA:  Enteric tube placement EXAM: PORTABLE ABDOMEN - 1 VIEW COMPARISON:  March 04, 2020 FINDINGS: Enteric tube tip is in the proximal stomach. There is fairly diffuse stool throughout the colon. There is no bowel dilatation or air-fluid level to suggest bowel obstruction. No free air. There is consolidation and effusion in the left lung base region. IMPRESSION: Enteric tube tip in proximal stomach. No bowel obstruction or free air. Fairly diffuse stool noted throughout colon. Effusion and consolidation visualized left lung base. Electronically Signed   By: Bretta BangWilliam  Woodruff III M.D.   On: 03/16/2020 11:39   DG Abd Portable 1V  Result Date: 03/04/2020 CLINICAL DATA:  Feeding tube placement EXAM: PORTABLE ABDOMEN - 1 VIEW COMPARISON:  02/10/2020 FINDINGS: Feeding tube is in place with the tip in the fundus of the stomach. IMPRESSION: Feeding tube tip in the fundus of the stomach. Electronically Signed   By: Charlett NoseKevin  Dover M.D.   On: 03/04/2020 16:04    Labs:  CBC: Recent Labs    03/22/20 0541 03/22/20 1551 03/25/20 0700 03/26/20 0714  WBC 4.3 4.9 3.6* 4.8  HGB 6.1* 7.7* 7.2* 8.0*  HCT 19.8* 25.7* 23.9* 26.6*  PLT 150 154 152 151    COAGS: Recent Labs     02/02/20 0956 02/06/20 1528 03/05/20 1240 03/06/20 0632  INR 1.0 1.8* 1.2 1.1  APTT 29 41*  --   --     BMP: Recent Labs    03/22/20 0541 03/23/20 1033 03/25/20 0700 03/26/20 0714  NA 138 138 136 138  K 3.2* 4.0 3.8 4.1  CL 103 102 97* 95*  CO2 27 27 32 36*  GLUCOSE 172*  126* 126* 97  BUN 36* 45* 36* 45*  CALCIUM 7.8* 7.9* 7.8* 8.3*  CREATININE 1.14 0.92 0.79 0.87  GFRNONAA >60 >60 >60 >60  GFRAA >60 >60 >60 >60    LIVER FUNCTION TESTS: Recent Labs    02/15/20 0448 02/15/20 0448 02/19/20 0308 02/20/20 0420 03/05/20 0558 03/21/20 1015  BILITOT 1.3*  --  0.8 0.9 0.7  --   AST 23  --  74* 49* 41  --   ALT 43  --  100* 89* 61*  --   ALKPHOS 42  --  76 102 141*  --   PROT 4.2*  --  5.1* 5.3* 5.5*  --   ALBUMIN 2.3*   < > 2.1* 2.2* 2.1* 1.6*   < > = values in this interval not displayed.    TUMOR MARKERS: No results for input(s): AFPTM, CEA, CA199, CHROMGRNA in the last 8760 hours.  Assessment and Plan:  Dysphagia Deconditioning/ FTT Need for long term care Scheduled for percutaneous gastric tube placement in IR tentatively for [facility] 7/26 Off Plavix now Will need po Barium 2 days before procedure (PA will order when appropriate) Check re CT over weekend Risks and benefits image guided gastrostomy tube placement was discussed with the patient's nephew [name] via phone including, but not limited to the need for a barium enema during the procedure, bleeding, infection, peritonitis and/or damage to adjacent structures.  All questions were answered, he is agreeable to proceed. Consent signed and in chart.  Thank you for this interesting consult.  I greatly enjoyed meeting CARLEN FILS and look forward to participating in their care.  A copy of this report was sent to the requesting provider on this date.  Electronically Signed: Robet Leu, PA-C 03/26/2020, 10:30 AM   I spent a total of 40 Minutes    in face to face in clinical consultation, greater  than 50% of which was counseling/coordinating care for percutaneous gastric tube placement

## 2020-03-27 ENCOUNTER — Other Ambulatory Visit (HOSPITAL_COMMUNITY): Payer: Medicare HMO

## 2020-03-27 MED ORDER — LIDOCAINE HCL 1 % IJ SOLN
INTRAMUSCULAR | Status: AC
  Filled 2020-03-27: qty 20

## 2020-03-27 NOTE — Progress Notes (Signed)
IR consulted by Merril Abbe, NP for possible image-guided left thoracentesis.  Limited right chest US revealed little to no fluid with multiple septations that could not safely be accessed with procedure today. Images sent to Dr. Grace Isaac for review. Informed patient that procedure will not occur today. Merril Abbe, NP made aware.  IR available in future if needed.   Waylan Boga Lamonta Cypress, PA-C 03/27/2020, 2:45 PM

## 2020-03-27 NOTE — Progress Notes (Signed)
Pulmonary Critical Care Medicine [facility] [place]   PULMONARY CRITICAL CARE SERVICE  PROGRESS NOTE  Date of Service: 03/27/2020  Tony Munoz  TSV:[id number]  DOB: Nov 18, 1955   DOA: 03/04/2020  Referring Physician: Carron Curie, MD  HPI: Tony Munoz is a 64 y.o. male seen for follow up of Acute on Chronic Respiratory Failure.  Patient had an ultrasound which really showed no significant fluid but there are multiple septations and therefore it was not safe to do an ultrasound guided thoracentesis.  Previously had 650 cc of fluid removed  Medications: Reviewed on Rounds  Physical Exam:  Vitals: Temperature 98.3 pulse 79 respiratory rate is 20 blood pressure is 120/64 saturations 100%  Ventilator Settings on pressure support FiO2 is 45% pressure support 12 PEEP 5  . General: Comfortable at this time . Eyes: Grossly normal lids, irises & conjunctiva . ENT: grossly tongue is normal . Neck: no obvious mass . Cardiovascular: S1 S2 normal no gallop . Respiratory: No rhonchi very coarse breath sounds . Abdomen: soft . Skin: no rash seen on limited exam . Musculoskeletal: not rigid . Psychiatric:unable to assess . Neurologic: no seizure no involuntary movements         Lab Data:   Basic Metabolic Panel: Recent Labs  Lab 03/21/20 1015 03/22/20 0541 03/23/20 1033 03/25/20 0700 03/26/20 0714  NA 141 138 138 136 138  K 3.8 3.2* 4.0 3.8 4.1  CL 102 103 102 97* 95*  CO2 29 27 27  32 36*  GLUCOSE 85 172* 126* 126* 97  BUN 37* 36* 45* 36* 45*  CREATININE 1.08 1.14 0.92 0.79 0.87  CALCIUM 8.1* 7.8* 7.9* 7.8* 8.3*  MG 2.0  --   --   --  2.0  PHOS 3.0  --   --   --  2.8    ABG: Recent Labs  Lab 03/21/20 0430 03/21/20 1430  PHART 7.237* 7.472*  PCO2ART 78.9* 41.0  PO2ART 70.6* 109*  HCO3 31.9* 29.5*  O2SAT 88.4 98.4    Liver Function Tests: Recent Labs  Lab 03/21/20 1015 03/26/20 1118  PROT  --  4.8*  ALBUMIN 1.6*  --    No results for  input(s): LIPASE, AMYLASE in the last 168 hours. No results for input(s): AMMONIA in the last 168 hours.  CBC: Recent Labs  Lab 03/21/20 1015 03/22/20 0541 03/22/20 1551 03/25/20 0700 03/26/20 0714  WBC 6.3 4.3 4.9 3.6* 4.8  HGB 7.4* 6.1* 7.7* 7.2* 8.0*  HCT 25.5* 19.8* 25.7* 23.9* 26.6*  MCV 94.1 93.0 93.1 94.5 93.7  PLT 181 150 154 152 151    Cardiac Enzymes: No results for input(s): CKTOTAL, CKMB, CKMBINDEX, TROPONINI in the last 168 hours.  BNP (last 3 results) Recent Labs    02/10/20 0355  BNP 675.6*    ProBNP (last 3 results) No results for input(s): PROBNP in the last 8760 hours.  Radiological Exams: DG Chest Port 1 View  Result Date: 03/26/2020 CLINICAL DATA:  Post right-sided thoracentesis. History of chronic respiratory failure, atrial fibrillation and COPD. EXAM: PORTABLE CHEST 1 VIEW COMPARISON:  Radiographs and chest CT 03/25/2020. FINDINGS: 1406 hours. Tracheostomy and feeding tube appear unchanged, although the tip of the latter is not visualized. The heart size and mediastinal contours are stable status post median sternotomy. The right-sided pleural effusion appears decreased in volume. Partially loculated left pleural effusion and left greater than right airspace opacities superimposed on underlying emphysema are unchanged. No evidence of pneumothorax. Previous lower cervical fusion noted.  IMPRESSION: 1. Decreased volume of right pleural effusion status post thoracentesis. No evidence of pneumothorax. 2. Grossly unchanged left greater than right airspace opacities and pleural effusions superimposed on underlying emphysema. Electronically Signed   By: Carey Bullocks M.D.   On: 03/26/2020 14:41   IR US CHEST  Result Date: 03/27/2020 CLINICAL DATA:  Bilateral pleural effusions. History of right-sided ultrasound-guided thoracentesis on 03/26/2020 yielding 650 cc of pleural fluid. Please perform left-sided chest ultrasound and ultrasound-guided thoracentesis as  indicated. EXAM: CHEST ULTRASOUND COMPARISON:  Chest radiograph-03/26/2020; 03/25/2020; chest CT-03/25/2020 FINDINGS: Sonographic evaluation of the left chest demonstrates a trace amount of partially loculated left-sided pleural fluid, too small to allow for safe ultrasound-guided thoracentesis. No thoracentesis attempted. IMPRESSION: Trace amount loculated left-sided pleural fluid, too small to allow for safe ultrasound-guided thoracentesis. No thoracentesis attempted. Electronically Signed   By: Simonne Come M.D.   On: 03/27/2020 15:46   IR THORACENTESIS ASP PLEURAL SPACE W/IMG GUIDE  Result Date: 03/26/2020 INDICATION: Patient with history of acute on chronic respiratory failure secondary to severe COPD s/p trach placement, dyspnea, and bilateral pleural effusions. Request is made for diagnostic and therapeutic right thoracentesis. EXAM: ULTRASOUND GUIDED DIAGNOSTIC AND THERAPEUTIC RIGHT THORACENTESIS MEDICATIONS: 8 mL 1% lidocaine COMPLICATIONS: None immediate. PROCEDURE: An ultrasound guided thoracentesis was thoroughly discussed with the patient and questions answered. The benefits, risks, alternatives and complications were also discussed. The patient understands and wishes to proceed with the procedure. Written consent was obtained. Ultrasound was performed to localize and mark an adequate pocket of fluid in the right chest. The area was then prepped and draped in the normal sterile fashion. 1% Lidocaine was used for local anesthesia. Under ultrasound guidance a 6 Fr Safe-T-Centesis catheter was introduced. Thoracentesis was performed. The catheter was removed and a dressing applied. FINDINGS: A total of approximately 650 mL of clear gold fluid was removed. Samples were sent to the laboratory as requested by the clinical team. IMPRESSION: Successful ultrasound guided right thoracentesis yielding 650 mL of pleural fluid. Read by: Elwin Mocha, PA-C No pneumothorax on follow-up radiograph. Electronically  Signed   By: Corlis Leak M.D.   On: 03/26/2020 14:54    Assessment/Plan Active Problems:   Acute on chronic respiratory failure with hypoxia (HCC)   Cardiac arrest (HCC)   COPD, severe (HCC)   Coronary artery disease involving native coronary artery of native heart   Atrial fibrillation, rapid (HCC)   1. Acute on chronic respiratory failure with hypoxia patient is going to continue with the wean protocol the goal is for T collar 16 hours. 2. Cardiac arrest rhythm is stable we will continue to monitor 3. Severe COPD medical management 4. Coronary artery disease at baseline we will continue with supportive care. 5. Atrial fibrillation rate controlled we will continue to follow along.   I have personally seen and evaluated the patient, evaluated laboratory and imaging results, formulated the assessment and plan and placed orders. The Patient requires high complexity decision making with multiple systems involvement.  Rounds were done with the Respiratory Therapy Director and Staff therapists and discussed with nursing staff also.  Yevonne Pax, MD [facility] Pulmonary Critical Care Medicine Sleep Medicine

## 2020-03-28 LAB — CBC
HCT: 25.9 % — ABNORMAL LOW (ref 39.0–52.0)
Hemoglobin: 7.7 g/dL — ABNORMAL LOW (ref 13.0–17.0)
MCH: 27.5 pg (ref 26.0–34.0)
MCHC: 29.7 g/dL — ABNORMAL LOW (ref 30.0–36.0)
MCV: 92.5 fL (ref 80.0–100.0)
Platelets: 172 10*3/uL (ref 150–400)
RBC: 2.8 MIL/uL — ABNORMAL LOW (ref 4.22–5.81)
RDW: 17.1 % — ABNORMAL HIGH (ref 11.5–15.5)
WBC: 4.3 10*3/uL (ref 4.0–10.5)
nRBC: 0 % (ref 0.0–0.2)

## 2020-03-28 LAB — BASIC METABOLIC PANEL
Anion gap: 8 (ref 5–15)
BUN: 35 mg/dL — ABNORMAL HIGH (ref 8–23)
CO2: 36 mmol/L — ABNORMAL HIGH (ref 22–32)
Calcium: 8.1 mg/dL — ABNORMAL LOW (ref 8.9–10.3)
Chloride: 94 mmol/L — ABNORMAL LOW (ref 98–111)
Creatinine, Ser: 0.9 mg/dL (ref 0.61–1.24)
GFR calc Af Amer: >60 mL/min (ref >60)
GFR calc non Af Amer: >60 mL/min (ref >60)
Glucose, Bld: 115 mg/dL — ABNORMAL HIGH (ref 70–99)
Potassium: 3.6 mmol/L (ref 3.5–5.1)
Sodium: 138 mmol/L (ref 135–145)

## 2020-03-28 LAB — PHOSPHORUS: Phosphorus: 3 mg/dL (ref 2.5–4.6)

## 2020-03-28 LAB — MAGNESIUM: Magnesium: 1.9 mg/dL (ref 1.7–2.4)

## 2020-03-28 NOTE — Progress Notes (Signed)
Pulmonary Critical Care Medicine [facility] [place]   PULMONARY CRITICAL CARE SERVICE  PROGRESS NOTE  Date of Service: 03/28/2020  Tony Munoz  HQP:[id number]  DOB: 02-05-1956   DOA: 03/04/2020  Referring Physician: Carron Curie, MD  HPI: Tony Munoz is a 64 y.o. male seen for follow up of Acute on Chronic Respiratory Failure.  Patient is on T collar was goal of about 9 hours completed  Medications: Reviewed on Rounds  Physical Exam:  Vitals: Temperature is 98.6 pulse 90 respiratory rate 30 blood pressure is 119/65 saturations 95%  Ventilator Settings T collar FiO2 40%  . General: Comfortable at this time . Eyes: Grossly normal lids, irises & conjunctiva . ENT: grossly tongue is normal . Neck: no obvious mass . Cardiovascular: S1 S2 normal no gallop . Respiratory: No rhonchi coarse breath sounds . Abdomen: soft . Skin: no rash seen on limited exam . Musculoskeletal: not rigid . Psychiatric:unable to assess . Neurologic: no seizure no involuntary movements         Lab Data:   Basic Metabolic Panel: Recent Labs  Lab 03/22/20 0541 03/23/20 1033 03/25/20 0700 03/26/20 0714 03/28/20 0626  NA 138 138 136 138 138  K 3.2* 4.0 3.8 4.1 3.6  CL 103 102 97* 95* 94*  CO2 27 27 32 36* 36*  GLUCOSE 172* 126* 126* 97 115*  BUN 36* 45* 36* 45* 35*  CREATININE 1.14 0.92 0.79 0.87 0.90  CALCIUM 7.8* 7.9* 7.8* 8.3* 8.1*  MG  --   --   --  2.0 1.9  PHOS  --   --   --  2.8 3.0    ABG: No results for input(s): PHART, PCO2ART, PO2ART, HCO3, O2SAT in the last 168 hours.  Liver Function Tests: Recent Labs  Lab 03/26/20 1118  PROT 4.8*   No results for input(s): LIPASE, AMYLASE in the last 168 hours. No results for input(s): AMMONIA in the last 168 hours.  CBC: Recent Labs  Lab 03/22/20 0541 03/22/20 1551 03/25/20 0700 03/26/20 0714 03/28/20 0626  WBC 4.3 4.9 3.6* 4.8 4.3  HGB 6.1* 7.7* 7.2* 8.0* 7.7*  HCT 19.8* 25.7* 23.9* 26.6* 25.9*  MCV 93.0  93.1 94.5 93.7 92.5  PLT 150 154 152 151 172    Cardiac Enzymes: No results for input(s): CKTOTAL, CKMB, CKMBINDEX, TROPONINI in the last 168 hours.  BNP (last 3 results) Recent Labs    02/10/20 0355  BNP 675.6*    ProBNP (last 3 results) No results for input(s): PROBNP in the last 8760 hours.  Radiological Exams: IR US CHEST  Result Date: 03/27/2020 CLINICAL DATA:  Bilateral pleural effusions. History of right-sided ultrasound-guided thoracentesis on 03/26/2020 yielding 650 cc of pleural fluid. Please perform left-sided chest ultrasound and ultrasound-guided thoracentesis as indicated. EXAM: CHEST ULTRASOUND COMPARISON:  Chest radiograph-03/26/2020; 03/25/2020; chest CT-03/25/2020 FINDINGS: Sonographic evaluation of the left chest demonstrates a trace amount of partially loculated left-sided pleural fluid, too small to allow for safe ultrasound-guided thoracentesis. No thoracentesis attempted. IMPRESSION: Trace amount loculated left-sided pleural fluid, too small to allow for safe ultrasound-guided thoracentesis. No thoracentesis attempted. Electronically Signed   By: Simonne Come M.D.   On: 03/27/2020 15:46    Assessment/Plan Active Problems:   Acute on chronic respiratory failure with hypoxia (HCC)   Cardiac arrest (HCC)   COPD, severe (HCC)   Coronary artery disease involving native coronary artery of native heart   Atrial fibrillation, rapid (HCC)   1. Acute on chronic respiratory failure with  hypoxia plan is to continue with the weaning on T collar will try to advance beyond 9 hours if possible  2. cardiac arrest rhythm has been stable we will continue to monitor. 3. Severe COPD medical management 4. Coronary artery disease at baseline we will continue with supportive care 5. Atrial fibrillation no change rate is controlled   I have personally seen and evaluated the patient, evaluated laboratory and imaging results, formulated the assessment and plan and placed  orders. The Patient requires high complexity decision making with multiple systems involvement.  Rounds were done with the Respiratory Therapy Director and Staff therapists and discussed with nursing staff also.  Yevonne Pax, MD [facility] Pulmonary Critical Care Medicine Sleep Medicine

## 2020-03-29 ENCOUNTER — Other Ambulatory Visit (HOSPITAL_COMMUNITY): Payer: Medicare HMO

## 2020-03-29 NOTE — Progress Notes (Signed)
Pulmonary Critical Care Medicine [facility] [place]   PULMONARY CRITICAL CARE SERVICE  PROGRESS NOTE  Date of Service: 03/29/2020  Tony Munoz  OOI:[id number]  DOB: 1956/05/12   DOA: 03/04/2020  Referring Physician: Carron Curie, MD  HPI: Tony Munoz is a 64 y.o. male seen for follow up of Acute on Chronic Respiratory Failure.  Patient is off the ventilator on T collar has been on 40% FiO2 hemoptysis is improving  Medications: Reviewed on Rounds  Physical Exam:  Vitals: Temperature 97.2 pulse 74 respiratory rate 17 blood pressure is 105/58 saturations 97%  Ventilator Settings on T collar FiO2 40%   General: Comfortable at this time  Eyes: Grossly normal lids, irises & conjunctiva  ENT: grossly tongue is normal  Neck: no obvious mass  Cardiovascular: S1 S2 normal no gallop  Respiratory: No rhonchi no rales are noted at this time  Abdomen: soft  Skin: no rash seen on limited exam  Musculoskeletal: not rigid  Psychiatric:unable to assess  Neurologic: no seizure no involuntary movements         Lab Data:   Basic Metabolic Panel: Recent Labs  Lab 03/23/20 1033 03/25/20 0700 03/26/20 0714 03/28/20 0626  NA 138 136 138 138  K 4.0 3.8 4.1 3.6  CL 102 97* 95* 94*  CO2 27 32 36* 36*  GLUCOSE 126* 126* 97 115*  BUN 45* 36* 45* 35*  CREATININE 0.92 0.79 0.87 0.90  CALCIUM 7.9* 7.8* 8.3* 8.1*  MG  --   --  2.0 1.9  PHOS  --   --  2.8 3.0    ABG: No results for input(s): PHART, PCO2ART, PO2ART, HCO3, O2SAT in the last 168 hours.  Liver Function Tests: Recent Labs  Lab 03/26/20 1118  PROT 4.8*   No results for input(s): LIPASE, AMYLASE in the last 168 hours. No results for input(s): AMMONIA in the last 168 hours.  CBC: Recent Labs  Lab 03/22/20 1551 03/25/20 0700 03/26/20 0714 03/28/20 0626  WBC 4.9 3.6* 4.8 4.3  HGB 7.7* 7.2* 8.0* 7.7*  HCT 25.7* 23.9* 26.6* 25.9*  MCV 93.1 94.5 93.7 92.5  PLT 154 152 151 172    Cardiac  Enzymes: No results for input(s): CKTOTAL, CKMB, CKMBINDEX, TROPONINI in the last 168 hours.  BNP (last 3 results) Recent Labs    02/10/20 0355  BNP 675.6*    ProBNP (last 3 results) No results for input(s): PROBNP in the last 8760 hours.  Radiological Exams: IR US CHEST  Result Date: 03/27/2020 CLINICAL DATA:  Bilateral pleural effusions. History of right-sided ultrasound-guided thoracentesis on 03/26/2020 yielding 650 cc of pleural fluid. Please perform left-sided chest ultrasound and ultrasound-guided thoracentesis as indicated. EXAM: CHEST ULTRASOUND COMPARISON:  Chest radiograph-03/26/2020; 03/25/2020; chest CT-03/25/2020 FINDINGS: Sonographic evaluation of the left chest demonstrates a trace amount of partially loculated left-sided pleural fluid, too small to allow for safe ultrasound-guided thoracentesis. No thoracentesis attempted. IMPRESSION: Trace amount loculated left-sided pleural fluid, too small to allow for safe ultrasound-guided thoracentesis. No thoracentesis attempted. Electronically Signed   By: Simonne Come M.D.   On: 03/27/2020 15:46    Assessment/Plan Active Problems:   Acute on chronic respiratory failure with hypoxia (HCC)   Cardiac arrest (HCC)   COPD, severe (HCC)   Coronary artery disease involving native coronary artery of native heart   Atrial fibrillation, rapid (HCC)   1. Acute on chronic respiratory failure hypoxia we will continue with T collar trials titrate oxygen continue pulmonary toilet. 2. Cardiac arrest  rhythm is stable we will continue to follow 3. Severe COPD at baseline 4. Coronary artery disease supportive care 5. Atrial fibrillation rate controlled   I have personally seen and evaluated the patient, evaluated laboratory and imaging results, formulated the assessment and plan and placed orders. The Patient requires high complexity decision making with multiple systems involvement.  Rounds were done with the Respiratory Therapy Director  and Staff therapists and discussed with nursing staff also.  Yevonne Pax, MD [facility] Pulmonary Critical Care Medicine Sleep Medicine

## 2020-03-30 ENCOUNTER — Other Ambulatory Visit (HOSPITAL_COMMUNITY): Payer: Medicare HMO

## 2020-03-30 LAB — CBC
HCT: 28.2 % — ABNORMAL LOW (ref 39.0–52.0)
Hemoglobin: 8.6 g/dL — ABNORMAL LOW (ref 13.0–17.0)
MCH: 28.2 pg (ref 26.0–34.0)
MCHC: 30.5 g/dL (ref 30.0–36.0)
MCV: 92.5 fL (ref 80.0–100.0)
Platelets: 210 10*3/uL (ref 150–400)
RBC: 3.05 MIL/uL — ABNORMAL LOW (ref 4.22–5.81)
RDW: 17.4 % — ABNORMAL HIGH (ref 11.5–15.5)
WBC: 6.6 10*3/uL (ref 4.0–10.5)
nRBC: 0 % (ref 0.0–0.2)

## 2020-03-30 LAB — BASIC METABOLIC PANEL
Anion gap: 8 (ref 5–15)
BUN: 44 mg/dL — ABNORMAL HIGH (ref 8–23)
CO2: 37 mmol/L — ABNORMAL HIGH (ref 22–32)
Calcium: 8.3 mg/dL — ABNORMAL LOW (ref 8.9–10.3)
Chloride: 93 mmol/L — ABNORMAL LOW (ref 98–111)
Creatinine, Ser: 0.86 mg/dL (ref 0.61–1.24)
GFR calc Af Amer: >60 mL/min (ref >60)
GFR calc non Af Amer: >60 mL/min (ref >60)
Glucose, Bld: 112 mg/dL — ABNORMAL HIGH (ref 70–99)
Potassium: 3.9 mmol/L (ref 3.5–5.1)
Sodium: 138 mmol/L (ref 135–145)

## 2020-03-30 LAB — MAGNESIUM: Magnesium: 2.1 mg/dL (ref 1.7–2.4)

## 2020-03-30 LAB — PHOSPHORUS: Phosphorus: 3.5 mg/dL (ref 2.5–4.6)

## 2020-03-30 NOTE — Progress Notes (Signed)
Patient planned for gastrostomy placement 7/26 in IR - will need barium via NG today per interventional radiologist.  Floor to call [phone] to have barium sent to floor, give 1 cup thin barium via NG today at 2000 (8 pm). Discussed with patient bedside RN Maralyn Sago today via phone who states understanding.  Lynnette Caffey, PA-C

## 2020-03-31 ENCOUNTER — Other Ambulatory Visit (HOSPITAL_COMMUNITY): Payer: Medicare HMO

## 2020-03-31 DIAGNOSIS — J948 Other specified pleural conditions: Secondary | ICD-10-CM | POA: Diagnosis not present

## 2020-03-31 LAB — CULTURE, BODY FLUID W GRAM STAIN -BOTTLE: Culture: NO GROWTH

## 2020-03-31 LAB — GRAM STAIN

## 2020-03-31 LAB — PROTEIN, PLEURAL OR PERITONEAL FLUID: Total protein, fluid: <3 g/dL

## 2020-03-31 LAB — GLUCOSE, PLEURAL OR PERITONEAL FLUID: Glucose, Fluid: 94 mg/dL

## 2020-03-31 LAB — PROTEIN, TOTAL: Total Protein: 5.6 g/dL — ABNORMAL LOW (ref 6.5–8.1)

## 2020-03-31 MED ORDER — LIDOCAINE HCL (PF) 1 % IJ SOLN
INTRAMUSCULAR | Status: AC
  Filled 2020-03-31: qty 30

## 2020-03-31 NOTE — Progress Notes (Addendum)
Pulmonary Critical Care Medicine [facility] [place]   PULMONARY CRITICAL CARE SERVICE  PROGRESS NOTE  Date of Service: 03/31/2020  Tony Munoz  ZOX:[id number]  DOB: [birth year]   DOA: 03/04/2020  Referring Physician: Carron Curie, MD  HPI: Tony Munoz is a 64 y.o. male seen for follow up of Acute on Chronic Respiratory Failure.  Patient remains on full support at this time had a thoracentesis and has been unable to wean to aerosol trach collar, 55% FiO2 satting well.  Medications: Reviewed on Rounds  Physical Exam:  Vitals: Pulse 78 respirations 20 BP 94/50 O2 sat 95% temp 97.8  Ventilator Settings ventilator mode AC PC rate of 20 respiratory pressure of 22 PEEP of 5 and FiO2 55%  . General: Comfortable at this time . Eyes: Grossly normal lids, irises & conjunctiva . ENT: grossly tongue is normal . Neck: no obvious mass . Cardiovascular: S1 S2 normal no gallop . Respiratory: No rales or rhonchi noted . Abdomen: soft . Skin: no rash seen on limited exam . Musculoskeletal: not rigid . Psychiatric:unable to assess . Neurologic: no seizure no involuntary movements         Lab Data:   Basic Metabolic Panel: Recent Labs  Lab 03/25/20 0700 03/26/20 0714 03/28/20 0626 03/30/20 0900  NA 136 138 138 138  K 3.8 4.1 3.6 3.9  CL 97* 95* 94* 93*  CO2 32 36* 36* 37*  GLUCOSE 126* 97 115* 112*  BUN 36* 45* 35* 44*  CREATININE 0.79 0.87 0.90 0.86  CALCIUM 7.8* 8.3* 8.1* 8.3*  MG  --  2.0 1.9 2.1  PHOS  --  2.8 3.0 3.5    ABG: No results for input(s): PHART, PCO2ART, PO2ART, HCO3, O2SAT in the last 168 hours.  Liver Function Tests: Recent Labs  Lab 03/26/20 1118 03/31/20 0855  PROT 4.8* 5.6*   No results for input(s): LIPASE, AMYLASE in the last 168 hours. No results for input(s): AMMONIA in the last 168 hours.  CBC: Recent Labs  Lab 03/25/20 0700 03/26/20 0714 03/28/20 0626 03/30/20 0900  WBC 3.6* 4.8 4.3 6.6  HGB 7.2* 8.0* 7.7* 8.6*  HCT  23.9* 26.6* 25.9* 28.2*  MCV 94.5 93.7 92.5 92.5  PLT 152 151 172 210    Cardiac Enzymes: No results for input(s): CKTOTAL, CKMB, CKMBINDEX, TROPONINI in the last 168 hours.  BNP (last 3 results) Recent Labs    02/10/20 0355  BNP 675.6*    ProBNP (last 3 results) No results for input(s): PROBNP in the last 8760 hours.  Radiological Exams: CT CHEST WO CONTRAST  Result Date: 03/30/2020 CLINICAL DATA:  Empyema. EXAM: CT CHEST WITHOUT CONTRAST TECHNIQUE: Multidetector CT imaging of the chest was performed following the standard protocol without IV contrast. COMPARISON:  March 25, 2020 FINDINGS: Cardiovascular: The heart size is stable from prior study. There is a trace pericardial effusion. Coronary artery calcifications are noted. There are atherosclerotic changes of the thoracic aorta. Mediastinum/Nodes: --mild mediastinal adenopathy is again noted. --mild hilar adenopathy is noted. -- No axillary lymphadenopathy. -- No supraclavicular lymphadenopathy. -- Normal thyroid gland where visualized. -the enteric tube courses through the patient's esophagus and terminates below the left hemidiaphragm. Lungs/Pleura: There are moderate to large bilateral pleural effusions. The left-sided pleural effusion again appears somewhat complex. There is near complete collapse of the left lower lobe. There is a heterogeneous appearance of the collapsed lung which is somewhat similar to prior study. There is new extensive consolidation within the left upper lobe.  Profound emphysematous changes are noted at the lung apices. There is architectural distortion within the left upper lobe which has significantly progressed across the patient's prior studies. The tracheostomy tube terminates above the carina. There is stable consolidation in the right middle lobe. Upper Abdomen: There is extensive oral contrast within the patient's stomach causing streak artifact and limiting evaluation of the upper abdomen. Given the  significant limitation, no acute abnormality was detected in the upper abdomen. Musculoskeletal: There are healing bilateral rib fractures. No definite acute displaced fracture identified on this study. IMPRESSION: 1. Evaluation limited by lack of IV contrast and streak artifact from the patient's arms. 2. Persistent moderate to large bilateral pleural effusions with near complete collapse of the left lower lobe. Again noted is a heterogeneous appearance of the collapsed left lung which may be secondary to aspiration or superimposed pneumonia. The left-sided pleural effusion again appears somewhat complex suggestive of an empyema. 3. New extensive consolidation within the left upper lobe concerning for pneumonia or aspiration. Aortic Atherosclerosis (ICD10-I70.0) and Emphysema (ICD10-J43.9). Electronically Signed   By: Katherine Mantle M.D.   On: 03/30/2020 22:37   DG CHEST PORT 1 VIEW  Result Date: 03/31/2020 CLINICAL DATA:  Status post thoracentesis. EXAM: PORTABLE CHEST 1 VIEW COMPARISON:  03/26/2020 FINDINGS: Slight interval reduction in volume of a layering left pleural effusion. No significant pneumothorax. Unchanged heterogeneous airspace opacity and consolidation of the left lung. Severe emphysema. Tracheostomy. Esophagogastric tube with tip and side port below the diaphragm. Status post median sternotomy. IMPRESSION: 1. Slight interval reduction in volume of a layering left pleural effusion. No significant pneumothorax. Unchanged heterogeneous airspace opacity and consolidation of the left lung. 2.  Emphysema. 3.  Tracheostomy. Electronically Signed   By: Lauralyn Primes M.D.   On: 03/31/2020 11:30   DG Abd Portable 1V  Result Date: 03/29/2020 CLINICAL DATA:  Nasogastric tube placement. EXAM: PORTABLE ABDOMEN - 1 VIEW COMPARISON:  March 16, 2020 FINDINGS: A nasogastric tube is seen with its distal tip overlying the expected region of the body of the stomach. The bowel gas pattern is normal. Multiple  sternal wires are seen. Moderate to marked severity areas of atelectasis and/or infiltrate are seen within the bilateral lung bases. Small bilateral pleural effusions are also noted. IMPRESSION: 1. Nasogastric tube positioning, as described above. 2. Moderate to marked severity bibasilar atelectasis and/or infiltrate. 3. Small bilateral pleural effusions. Electronically Signed   By: Aram Candela M.D.   On: 03/29/2020 21:35   US THORACENTESIS ASP PLEURAL SPACE W/IMG GUIDE  Result Date: 03/31/2020 INDICATION: Patient with acute on chronic respiratory failure and pleural effusions. Interventional radiology asked to perform a therapeutic and diagnostic thoracentesis. EXAM: ULTRASOUND GUIDED THORACENTESIS MEDICATIONS: 1% lidocaine 10 mL COMPLICATIONS: None immediate. PROCEDURE: An ultrasound guided thoracentesis was thoroughly discussed with the patient and questions answered. The benefits, risks, alternatives and complications were also discussed. The patient understands and wishes to proceed with the procedure. Written consent was obtained. Ultrasound was performed to localize and mark an adequate pocket of fluid in the left chest. The area was then prepped and draped in the normal sterile fashion. 1% Lidocaine was used for local anesthesia. Under ultrasound guidance a 6 Fr Safe-T-Centesis catheter was introduced. Thoracentesis was performed. The catheter was removed and a dressing applied. FINDINGS: A total of approximately 250 mL of light red fluid was removed. Samples were sent to the laboratory as requested by the clinical team. IMPRESSION: Successful ultrasound guided left thoracentesis yielding 250 mL of pleural fluid.  Read by: Alwyn Ren, NP No pneumothorax on follow-up radiograph. Electronically Signed   By: Corlis Leak M.D.   On: 03/31/2020 12:14    Assessment/Plan Active Problems:   Acute on chronic respiratory failure with hypoxia (HCC)   Cardiac arrest (HCC)   COPD, severe (HCC)    Coronary artery disease involving native coronary artery of native heart   Atrial fibrillation, rapid (HCC)   1. Acute on chronic respiratory failure hypoxia patient was supposed to wean today however after thoracentesis has been unable to wean aerosol trach collar had a 16-hour goal today currently remains on full support pressure control mode with FiO2 55% we will continue supportive measures pulmonary toilet continue to attempt weaning as patient can tolerate. 2. Cardiac arrest rhythm is stable we will continue to follow 3. Severe COPD at baseline 4. Coronary artery disease supportive care 5. Atrial fibrillation rate controlled   I have personally seen and evaluated the patient, evaluated laboratory and imaging results, formulated the assessment and plan and placed orders. The Patient requires high complexity decision making with multiple systems involvement.  Rounds were done with the Respiratory Therapy Director and Staff therapists and discussed with nursing staff also.  Yevonne Pax, MD [facility] Pulmonary Critical Care Medicine Sleep Medicine

## 2020-03-31 NOTE — Progress Notes (Addendum)
Pulmonary Critical Care Medicine [facility] [place]   PULMONARY CRITICAL CARE SERVICE  PROGRESS NOTE  Date of Service: 03/30/2020  Tony Munoz  NLG:[id number]  DOB: 18-Oct-1955   DOA: 03/04/2020  Referring Physician: Carron Curie, MD  HPI: Tony Munoz is a 64 y.o. male seen for follow up of Acute on Chronic Respiratory Failure.  Patient continues on aerosol trach collar 40% FiO2 for 16-hour goal today currently satting well no fever distress.  Medications: Reviewed on Rounds  Physical Exam:  Vitals: Pulse 76 respirations 20 BP 136/50 O2 sat 100% temp 98.3  Ventilator Settings ATC 40%  . General: Comfortable at this time . Eyes: Grossly normal lids, irises & conjunctiva . ENT: grossly tongue is normal . Neck: no obvious mass . Cardiovascular: S1 S2 normal no gallop . Respiratory: No rales or rhonchi noted . Abdomen: soft . Skin: no rash seen on limited exam . Musculoskeletal: not rigid . Psychiatric:unable to assess . Neurologic: no seizure no involuntary movements         Lab Data:   Basic Metabolic Panel: Recent Labs  Lab 03/25/20 0700 03/26/20 0714 03/28/20 0626 03/30/20 0900  NA 136 138 138 138  K 3.8 4.1 3.6 3.9  CL 97* 95* 94* 93*  CO2 32 36* 36* 37*  GLUCOSE 126* 97 115* 112*  BUN 36* 45* 35* 44*  CREATININE 0.79 0.87 0.90 0.86  CALCIUM 7.8* 8.3* 8.1* 8.3*  MG  --  2.0 1.9 2.1  PHOS  --  2.8 3.0 3.5    ABG: No results for input(s): PHART, PCO2ART, PO2ART, HCO3, O2SAT in the last 168 hours.  Liver Function Tests: Recent Labs  Lab 03/26/20 1118 03/31/20 0855  PROT 4.8* 5.6*   No results for input(s): LIPASE, AMYLASE in the last 168 hours. No results for input(s): AMMONIA in the last 168 hours.  CBC: Recent Labs  Lab 03/25/20 0700 03/26/20 0714 03/28/20 0626 03/30/20 0900  WBC 3.6* 4.8 4.3 6.6  HGB 7.2* 8.0* 7.7* 8.6*  HCT 23.9* 26.6* 25.9* 28.2*  MCV 94.5 93.7 92.5 92.5  PLT 152 151 172 210    Cardiac  Enzymes: No results for input(s): CKTOTAL, CKMB, CKMBINDEX, TROPONINI in the last 168 hours.  BNP (last 3 results) Recent Labs    02/10/20 0355  BNP 675.6*    ProBNP (last 3 results) No results for input(s): PROBNP in the last 8760 hours.  Radiological Exams: CT CHEST WO CONTRAST  Result Date: 03/30/2020 CLINICAL DATA:  Empyema. EXAM: CT CHEST WITHOUT CONTRAST TECHNIQUE: Multidetector CT imaging of the chest was performed following the standard protocol without IV contrast. COMPARISON:  March 25, 2020 FINDINGS: Cardiovascular: The heart size is stable from prior study. There is a trace pericardial effusion. Coronary artery calcifications are noted. There are atherosclerotic changes of the thoracic aorta. Mediastinum/Nodes: --mild mediastinal adenopathy is again noted. --mild hilar adenopathy is noted. -- No axillary lymphadenopathy. -- No supraclavicular lymphadenopathy. -- Normal thyroid gland where visualized. -the enteric tube courses through the patient's esophagus and terminates below the left hemidiaphragm. Lungs/Pleura: There are moderate to large bilateral pleural effusions. The left-sided pleural effusion again appears somewhat complex. There is near complete collapse of the left lower lobe. There is a heterogeneous appearance of the collapsed lung which is somewhat similar to prior study. There is new extensive consolidation within the left upper lobe. Profound emphysematous changes are noted at the lung apices. There is architectural distortion within the left upper lobe which has significantly progressed  across the patient's prior studies. The tracheostomy tube terminates above the carina. There is stable consolidation in the right middle lobe. Upper Abdomen: There is extensive oral contrast within the patient's stomach causing streak artifact and limiting evaluation of the upper abdomen. Given the significant limitation, no acute abnormality was detected in the upper abdomen.  Musculoskeletal: There are healing bilateral rib fractures. No definite acute displaced fracture identified on this study. IMPRESSION: 1. Evaluation limited by lack of IV contrast and streak artifact from the patient's arms. 2. Persistent moderate to large bilateral pleural effusions with near complete collapse of the left lower lobe. Again noted is a heterogeneous appearance of the collapsed left lung which may be secondary to aspiration or superimposed pneumonia. The left-sided pleural effusion again appears somewhat complex suggestive of an empyema. 3. New extensive consolidation within the left upper lobe concerning for pneumonia or aspiration. Aortic Atherosclerosis (ICD10-I70.0) and Emphysema (ICD10-J43.9). Electronically Signed   By: Katherine Mantle M.D.   On: 03/30/2020 22:37   DG CHEST PORT 1 VIEW  Result Date: 03/31/2020 CLINICAL DATA:  Status post thoracentesis. EXAM: PORTABLE CHEST 1 VIEW COMPARISON:  03/26/2020 FINDINGS: Slight interval reduction in volume of a layering left pleural effusion. No significant pneumothorax. Unchanged heterogeneous airspace opacity and consolidation of the left lung. Severe emphysema. Tracheostomy. Esophagogastric tube with tip and side port below the diaphragm. Status post median sternotomy. IMPRESSION: 1. Slight interval reduction in volume of a layering left pleural effusion. No significant pneumothorax. Unchanged heterogeneous airspace opacity and consolidation of the left lung. 2.  Emphysema. 3.  Tracheostomy. Electronically Signed   By: Lauralyn Primes M.D.   On: 03/31/2020 11:30   DG Abd Portable 1V  Result Date: 03/29/2020 CLINICAL DATA:  Nasogastric tube placement. EXAM: PORTABLE ABDOMEN - 1 VIEW COMPARISON:  March 16, 2020 FINDINGS: A nasogastric tube is seen with its distal tip overlying the expected region of the body of the stomach. The bowel gas pattern is normal. Multiple sternal wires are seen. Moderate to marked severity areas of atelectasis and/or  infiltrate are seen within the bilateral lung bases. Small bilateral pleural effusions are also noted. IMPRESSION: 1. Nasogastric tube positioning, as described above. 2. Moderate to marked severity bibasilar atelectasis and/or infiltrate. 3. Small bilateral pleural effusions. Electronically Signed   By: Aram Candela M.D.   On: 03/29/2020 21:35   US THORACENTESIS ASP PLEURAL SPACE W/IMG GUIDE  Result Date: 03/31/2020 INDICATION: Patient with acute on chronic respiratory failure and pleural effusions. Interventional radiology asked to perform a therapeutic and diagnostic thoracentesis. EXAM: ULTRASOUND GUIDED THORACENTESIS MEDICATIONS: 1% lidocaine 10 mL COMPLICATIONS: None immediate. PROCEDURE: An ultrasound guided thoracentesis was thoroughly discussed with the patient and questions answered. The benefits, risks, alternatives and complications were also discussed. The patient understands and wishes to proceed with the procedure. Written consent was obtained. Ultrasound was performed to localize and mark an adequate pocket of fluid in the left chest. The area was then prepped and draped in the normal sterile fashion. 1% Lidocaine was used for local anesthesia. Under ultrasound guidance a 6 Fr Safe-T-Centesis catheter was introduced. Thoracentesis was performed. The catheter was removed and a dressing applied. FINDINGS: A total of approximately 250 mL of light red fluid was removed. Samples were sent to the laboratory as requested by the clinical team. IMPRESSION: Successful ultrasound guided left thoracentesis yielding 250 mL of pleural fluid. Read by: Alwyn Ren, NP No pneumothorax on follow-up radiograph. Electronically Signed   By: Corlis Leak M.D.   On:  03/31/2020 12:14    Assessment/Plan Active Problems:   Acute on chronic respiratory failure with hypoxia (HCC)   Cardiac arrest (HCC)   COPD, severe (HCC)   Coronary artery disease involving native coronary artery of native heart   Atrial  fibrillation, rapid (HCC)   1. Acute on chronic respiratory failure hypoxia patient will continue on aerosol trach collar currently 40% FiO2 we will continue to wean as tolerated continue supportive measures and pulmonary toilet. 2. Cardiac arrest rhythm is stable we will continue to follow 3. Severe COPD at baseline 4. Coronary artery disease supportive care 5. Atrial fibrillation rate controlled   I have personally seen and evaluated the patient, evaluated laboratory and imaging results, formulated the assessment and plan and placed orders. The Patient requires high complexity decision making with multiple systems involvement.  Rounds were done with the Respiratory Therapy Director and Staff therapists and discussed with nursing staff also.  Yevonne Pax, MD [facility] Pulmonary Critical Care Medicine Sleep Medicine

## 2020-03-31 NOTE — Procedures (Signed)
PROCEDURE SUMMARY:  Successful US guided left thoracentesis. Yielded 250 ml of light red fluid. Pt tolerated procedure well. No immediate complications.  Specimen sent for labs. CXR ordered; no pneumothorax seen on CXR.   EBL < 5 mL  Mickie Kay, NP  03/31/2020 12:10 PM

## 2020-04-01 ENCOUNTER — Other Ambulatory Visit (HOSPITAL_COMMUNITY): Payer: Medicare HMO

## 2020-04-01 ENCOUNTER — Ambulatory Visit: Payer: Medicare HMO | Admitting: Cardiothoracic Surgery

## 2020-04-01 HISTORY — PX: IR GASTROSTOMY TUBE MOD SED: IMG625

## 2020-04-01 LAB — BASIC METABOLIC PANEL
Anion gap: 9 (ref 5–15)
BUN: 47 mg/dL — ABNORMAL HIGH (ref 8–23)
CO2: 36 mmol/L — ABNORMAL HIGH (ref 22–32)
Calcium: 8.3 mg/dL — ABNORMAL LOW (ref 8.9–10.3)
Chloride: 95 mmol/L — ABNORMAL LOW (ref 98–111)
Creatinine, Ser: 0.81 mg/dL (ref 0.61–1.24)
GFR calc Af Amer: >60 mL/min (ref >60)
GFR calc non Af Amer: >60 mL/min (ref >60)
Glucose, Bld: 114 mg/dL — ABNORMAL HIGH (ref 70–99)
Potassium: 3.9 mmol/L (ref 3.5–5.1)
Sodium: 140 mmol/L (ref 135–145)

## 2020-04-01 LAB — CBC
HCT: 27 % — ABNORMAL LOW (ref 39.0–52.0)
Hemoglobin: 8 g/dL — ABNORMAL LOW (ref 13.0–17.0)
MCH: 27.5 pg (ref 26.0–34.0)
MCHC: 29.6 g/dL — ABNORMAL LOW (ref 30.0–36.0)
MCV: 92.8 fL (ref 80.0–100.0)
Platelets: 205 10*3/uL (ref 150–400)
RBC: 2.91 MIL/uL — ABNORMAL LOW (ref 4.22–5.81)
RDW: 17.5 % — ABNORMAL HIGH (ref 11.5–15.5)
WBC: 5.7 10*3/uL (ref 4.0–10.5)
nRBC: 0 % (ref 0.0–0.2)

## 2020-04-01 LAB — PHOSPHORUS: Phosphorus: 3 mg/dL (ref 2.5–4.6)

## 2020-04-01 LAB — CYTOLOGY - NON PAP

## 2020-04-01 LAB — MAGNESIUM: Magnesium: 2 mg/dL (ref 1.7–2.4)

## 2020-04-01 MED ORDER — MIDAZOLAM HCL 2 MG/2ML IJ SOLN
INTRAMUSCULAR | Status: AC | PRN
  Administered 2020-04-01: 1 mg via INTRAVENOUS

## 2020-04-01 MED ORDER — MIDAZOLAM HCL 2 MG/2ML IJ SOLN
INTRAMUSCULAR | Status: AC
  Filled 2020-04-01: qty 2

## 2020-04-01 MED ORDER — FENTANYL CITRATE (PF) 100 MCG/2ML IJ SOLN
INTRAMUSCULAR | Status: AC | PRN
  Administered 2020-04-01: 25 ug via INTRAVENOUS

## 2020-04-01 MED ORDER — LIDOCAINE HCL 1 % IJ SOLN
INTRAMUSCULAR | Status: AC
  Filled 2020-04-01: qty 20

## 2020-04-01 MED ORDER — FENTANYL CITRATE (PF) 100 MCG/2ML IJ SOLN
INTRAMUSCULAR | Status: AC
  Filled 2020-04-01: qty 2

## 2020-04-01 MED ORDER — IOHEXOL 300 MG/ML  SOLN
50.0000 mL | Freq: Once | INTRAMUSCULAR | Status: AC | PRN
  Administered 2020-04-01: 10 mL

## 2020-04-01 NOTE — Consult Note (Signed)
Infectious Disease Consultation   HIMMAT ENBERG  UJW:[id number]  DOB: December 10, 1955  DOA: 03/04/2020  Requesting physician: Dr. Manson Passey  Reason for consultation: Antibiotic recommendations   History of Present Illness: Tony Munoz is an 64 y.o. male with past medical history significant for hypertension, carotid artery stenosis, nicotine abuse, multivessel coronary artery disease who was admitted to the outside hospital for carotid endarterectomy and coronary bypass surgery.  He was found to have significant coronary artery disease.  He underwent CABG x4 utilizing LIMA to LAD, RIMA to posterior descending coronary artery and SVG to the first and second diagonal branches of the LAD.  He also underwent harvesting of greater saphenous from right lower leg.  Patient tolerated the procedure.  However, his stay was complicated with rapid atrial fibrillation and worsening respiratory distress.  He was initiated on amiodarone.  Pulmonary, critical care team was consulted and the patient had to be intubated.  On postop day 3 patient apparently self extubated therefore had to be reintubated.  He is status post bronchoscopy with removal of aspirated blood.  Patient also developed alcohol withdrawal.  Due to inability to wean from the vent he underwent tracheostomy on 02/15/2020.  He was treated with multiple antibiotics for suspected aspiration pneumonia.  Patient apparently coded on postoperative day 14 and had return of spontaneous circulation after CPR and IV epinephrine.  He continued to require significant ventilator support therefore was transferred to [facility] for further management on 03/04/2020.  After admission here he had respiratory cultures collected on 03/15/2020 which showed a few Enterobacter cloacae, few Enterococcus faecalis.  He had subsequent urine cultures that were collected on 03/15/2020 which showed 50,000 colonies per mL of Enterobacter cloacae.  Patient again had  respiratory cultures on 03/21/2020 due to increasing secretions which came back as normal respiratory flora.  He received treatment with ciprofloxacin and Flagyl which completed on 03/28/2020.  He continued to have increasing secretions.  CT of the chest without contrast on 03/30/2020 per report persistent moderate to large bilateral pleural effusions with near complete collapse of the left upper lobe, new extensive consolidation within the left upper lobe concerning for pneumonia or aspiration. He had repeat cultures collected on 03/31/2020 which are showing few Pseudomonas aeruginosa.  Final results still pending at this time.  He has been started on IV vancomycin, meropenem.  He had ultrasound-guided thoracentesis today on the left side with removal of 250 mL of fluid.  He remains on 55% FiO2.    Review of Systems:  He is nonverbal.  Unable to obtain review of systems.  Past Medical History: Past Medical History:  Diagnosis Date  . Abnormal EKG   . Acute on chronic respiratory failure with hypoxia (HCC)   . Anxiety   . Atrial fibrillation, rapid (HCC)   . Bilateral carotid bruits 12/20/2019  . Cardiac arrest (HCC)   . Carotid artery occlusion   . Cigarette smoker 12/20/2019  . COPD, severe (HCC)   . Coronary artery disease   . Coronary artery disease involving native coronary artery of native heart   . DOE (dyspnea on exertion) 12/20/2019  . Dyspnea   . Essential hypertension 12/20/2019  . Prominent abdominal aortic pulsation 12/20/2019  . Radiculopathy 02/15/2019    Past Surgical History: Past Surgical History:  Procedure Laterality Date  . ANTERIOR CERVICAL DECOMPRESSION/DISCECTOMY FUSION 4 LEVELS Bilateral 02/15/2019   Procedure: ANTERIOR CERVICAL DECOMPRESSION FUSION, CERVICAL FOUR-FIVE, CERVICAL FIVE-SIX, CERVICAL  SIX-SEVEN WITH INSTRUMENTATION AND ALLOGRAFT;  Surgeon: Estill Bambergumonski, Mark, MD;  Location: [facility] OR;  Service: Orthopedics;  Laterality: Bilateral;  . BACK SURGERY    . CORONARY  ARTERY BYPASS GRAFT N/A 02/06/2020   Procedure: CORONARY ARTERY BYPASS GRAFTING (CABG),  x Four with Bilateral IMAs, and right leg greater saphenous vein;  Surgeon: Linden DolinAtkins, Broadus Z, MD;  Location: [facility] OR;  Service: Open Heart Surgery;  Laterality: N/A;  . ENDARTERECTOMY Left 02/06/2020   Procedure: CAROTID ENDARTERECTOMY LEFT with  Patch Angioplasty Using Hemashield Platinum Finesse patch;  Surgeon: Sherren KernsFields, Charles E, MD;  [facility];  Service: Vascular;  Laterality: Left;  . EYE SURGERY Bilateral    Cataracts  . HERNIA REPAIR     double hernia - 336yrs ago  . IR THORACENTESIS ASP PLEURAL SPACE W/IMG GUIDE  03/26/2020  . LEFT HEART CATH AND CORONARY ANGIOGRAPHY N/A 01/19/2020   Procedure: LEFT HEART CATH AND CORONARY ANGIOGRAPHY;  Surgeon: Lyn RecordsSmith, Henry W, MD;  Location: [facility] INVASIVE CV LAB;  Service: Cardiovascular;  Laterality: N/A;  . TEE WITHOUT CARDIOVERSION N/A 02/06/2020   Procedure: TRANSESOPHAGEAL ECHOCARDIOGRAM (TEE);  Surgeon: Linden DolinAtkins, Broadus Z, MD;  [facility];  Service: Open Heart Surgery;  Laterality: N/A;  . TONSILLECTOMY       Allergies:  No Known Allergies   Social History:  reports that he has been smoking cigarettes. He has a 75.00 pack-year smoking history. He has never used smokeless tobacco. He reports current alcohol use. He reports that he does not use drugs.   Family History: No pertinent family history.  Physical Exam: Vitals:   03/31/20 0940 03/31/20 1015 04/01/20 1610  BP: 111/66 (!) 163/88 (!) 132/74  Pulse:   (!) 108  Resp:   (!) 30  SpO2:   99%    Constitutional: Ill-appearing male, opening eyes Head: Atraumatic, normocephalic Eyes: PERLA ENMT: external ears and nose appear normal, normal hearing, Lips appears normal, moist oral mucosa Neck: Has trach in place CVS: S1-S2 Respiratory: Decreased breath sound lower lobes, rhonchi, Rales heard Abdomen: soft nontender, nondistended, normal bowel sounds  Musculoskeletal: no edema Neuro: Awake,  following few commands, appears to have debility with generalized weakness Psych: Awake, patient on vent, unable to assess at this time Skin: Sacrococcygeal pressure ulcer unstageable, no new rashes  Data reviewed:  I have personally reviewed following labs and imaging studies Labs:  CBC: Recent Labs  Lab 03/26/20 0714 03/28/20 0626 03/30/20 0900 04/01/20 0711  WBC 4.8 4.3 6.6 5.7  HGB 8.0* 7.7* 8.6* 8.0*  HCT 26.6* 25.9* 28.2* 27.0*  MCV 93.7 92.5 92.5 92.8  PLT 151 172 210 205    Basic Metabolic Panel: Recent Labs  Lab 03/26/20 0714 03/26/20 0714 03/28/20 0626 03/28/20 0626 03/30/20 0900 04/01/20 0711  NA 138  --  138  --  138 140  K 4.1   < > 3.6   < > 3.9 3.9  CL 95*  --  94*  --  93* 95*  CO2 36*  --  36*  --  37* 36*  GLUCOSE 97  --  115*  --  112* 114*  BUN 45*  --  35*  --  44* 47*  CREATININE 0.87  --  0.90  --  0.86 0.81  CALCIUM 8.3*  --  8.1*  --  8.3* 8.3*  MG 2.0  --  1.9  --  2.1 2.0  PHOS 2.8  --  3.0  --  3.5 3.0   < > =  values in this interval not displayed.   GFR CrCl cannot be calculated (Unknown ideal weight.). Liver Function Tests: Recent Labs  Lab 03/26/20 1118 03/31/20 0855  PROT 4.8* 5.6*   No results for input(s): LIPASE, AMYLASE in the last 168 hours. No results for input(s): AMMONIA in the last 168 hours. Coagulation profile No results for input(s): INR, PROTIME in the last 168 hours.  Cardiac Enzymes: No results for input(s): CKTOTAL, CKMB, CKMBINDEX, TROPONINI in the last 168 hours. BNP: Invalid input(s): POCBNP CBG: No results for input(s): GLUCAP in the last 168 hours. D-Dimer No results for input(s): DDIMER in the last 72 hours. Hgb A1c No results for input(s): HGBA1C in the last 72 hours. Lipid Profile No results for input(s): CHOL, HDL, LDLCALC, TRIG, CHOLHDL, LDLDIRECT in the last 72 hours. Thyroid function studies No results for input(s): TSH, T4TOTAL, T3FREE, THYROIDAB in the last 72 hours.  Invalid input(s):  FREET3 Anemia work up No results for input(s): VITAMINB12, FOLATE, FERRITIN, TIBC, IRON, RETICCTPCT in the last 72 hours. Urinalysis    Component Value Date/Time   COLORURINE YELLOW 03/15/2020 1430   APPEARANCEUR CLOUDY (A) 03/15/2020 1430   LABSPEC 1.015 03/15/2020 1430   PHURINE 5.0 03/15/2020 1430   GLUCOSEU NEGATIVE 03/15/2020 1430   HGBUR MODERATE (A) 03/15/2020 1430   BILIRUBINUR NEGATIVE 03/15/2020 1430   KETONESUR NEGATIVE 03/15/2020 1430   PROTEINUR NEGATIVE 03/15/2020 1430   NITRITE NEGATIVE 03/15/2020 1430   LEUKOCYTESUR LARGE (A) 03/15/2020 1430     Microbiology Recent Results (from the past 240 hour(s))  Gram stain     Status: None   Collection Time: 03/26/20  2:06 PM   Specimen: Fluid  Result Value Ref Range Status   Specimen Description FLUID  Final   Special Requests NONE  Final   Gram Stain   Final    WBC PRESENT,BOTH PMN AND MONONUCLEAR NO ORGANISMS SEEN CYTOSPIN SMEAR Performed at [facility], [address] [place], [place] [address]    Report Status 03/26/2020 FINAL  Final  Culture, body fluid-bottle     Status: None   Collection Time: 03/26/20  2:06 PM   Specimen: Fluid  Result Value Ref Range Status   Specimen Description FLUID  Final   Special Requests NONE  Final   Culture   Final    NO GROWTH 5 DAYS Performed at [facility], [address], [place], [place] [address]    Report Status 03/31/2020 FINAL  Final  Culture, respiratory (non-expectorated)     Status: None (Preliminary result)   Collection Time: 03/31/20 10:10 AM   Specimen: Tracheal Aspirate; Respiratory  Result Value Ref Range Status   Specimen Description TRACHEAL ASPIRATE  Final   Special Requests NONE  Final   Gram Stain   Final    RARE WBC PRESENT, PREDOMINANTLY PMN NO ORGANISMS SEEN    Culture   Final    FEW PSEUDOMONAS AERUGINOSA SUSCEPTIBILITIES TO FOLLOW Performed at [facility], [address], [place], [place] [address]    Report Status  PENDING  Incomplete  Gram stain     Status: None   Collection Time: 03/31/20 10:34 AM   Specimen: PATH Cytology Pleural fluid  Result Value Ref Range Status   Specimen Description CYTO PLEU  Final   Special Requests NONE  Final   Gram Stain   Final    WBC PRESENT, PREDOMINANTLY MONONUCLEAR NO ORGANISMS SEEN CYTOSPIN SMEAR Performed at [facility], [address],  [place], [place] [address]    Report Status 03/31/2020 FINAL  Final       Inpatient Medications:   Scheduled Meds: . fentaNYL      . lidocaine      . midazolam       Please see MAR for the rest of medications  Radiological Exams on Admission: CT CHEST WO CONTRAST  Result Date: 03/30/2020 CLINICAL DATA:  Empyema. EXAM: CT CHEST WITHOUT CONTRAST TECHNIQUE: Multidetector CT imaging of the chest was performed following the standard protocol without IV contrast. COMPARISON:  March 25, 2020 FINDINGS: Cardiovascular: The heart size is stable from prior study. There is a trace pericardial effusion. Coronary artery calcifications are noted. There are atherosclerotic changes of the thoracic aorta. Mediastinum/Nodes: --mild mediastinal adenopathy is again noted. --mild hilar adenopathy is noted. -- No axillary lymphadenopathy. -- No supraclavicular lymphadenopathy. -- Normal thyroid gland where visualized. -the enteric tube courses through the patient's esophagus and terminates below the left hemidiaphragm. Lungs/Pleura: There are moderate to large bilateral pleural effusions. The left-sided pleural effusion again appears somewhat complex. There is near complete collapse of the left lower lobe. There is a heterogeneous appearance of the collapsed lung which is somewhat similar to prior study. There is new extensive consolidation within the left upper lobe. Profound emphysematous changes are noted at the lung apices. There is architectural distortion within the left upper lobe which has significantly progressed across the patient's  prior studies. The tracheostomy tube terminates above the carina. There is stable consolidation in the right middle lobe. Upper Abdomen: There is extensive oral contrast within the patient's stomach causing streak artifact and limiting evaluation of the upper abdomen. Given the significant limitation, no acute abnormality was detected in the upper abdomen. Musculoskeletal: There are healing bilateral rib fractures. No definite acute displaced fracture identified on this study. IMPRESSION: 1. Evaluation limited by lack of IV contrast and streak artifact from the patient's arms. 2. Persistent moderate to large bilateral pleural effusions with near complete collapse of the left lower lobe. Again noted is a heterogeneous appearance of the collapsed left lung which may be secondary to aspiration or superimposed pneumonia. The left-sided pleural effusion again appears somewhat complex suggestive of an empyema. 3. New extensive consolidation within the left upper lobe concerning for pneumonia or aspiration. Aortic Atherosclerosis (ICD10-I70.0) and Emphysema (ICD10-J43.9). Electronically Signed   By: Katherine Mantle M.D.   On: 03/30/2020 22:37   DG CHEST PORT 1 VIEW  Result Date: 04/01/2020 CLINICAL DATA:  Breast tori failure and tracheostomy tube EXAM: PORTABLE CHEST 1 VIEW COMPARISON:  Yesterday FINDINGS: Tracheostomy and enteric tubes in place. Marked emphysema with left more than right pulmonary opacification and some lateral pleural fluid on the left. No visible air leak. No significant change from prior. Prior median sternotomy. Heart size is normal IMPRESSION: 1. Extensive airspace disease superimposed on emphysema, similar to prior. 2. Lateral left pleural effusion which is more prominent than yesterday. Electronically Signed   By: Marnee Spring M.D.   On: 04/01/2020 06:52   DG CHEST PORT 1 VIEW  Result Date: 03/31/2020 CLINICAL DATA:  Status post thoracentesis. EXAM: PORTABLE CHEST 1 VIEW COMPARISON:   03/26/2020 FINDINGS: Slight interval reduction in volume of a layering left pleural effusion. No significant pneumothorax. Unchanged heterogeneous airspace opacity and consolidation of the left lung. Severe emphysema. Tracheostomy. Esophagogastric tube with tip and side port below the diaphragm. Status post median sternotomy. IMPRESSION: 1. Slight interval reduction in volume of a layering left pleural effusion. No significant pneumothorax.  Unchanged heterogeneous airspace opacity and consolidation of the left lung. 2.  Emphysema. 3.  Tracheostomy. Electronically Signed   By: Lauralyn Primes M.D.   On: 03/31/2020 11:30   DG Abd Portable 1V  Result Date: 04/01/2020 CLINICAL DATA:  For PEG placement.: Position EXAM: PORTABLE ABDOMEN - 1 VIEW COMPARISON:  03/29/2020 FINDINGS: Good opacification of the colon showing a low transverse segment. Some enteric contrast is seen at the level of the stomach with indwelling gastric suction tube. Retrocardiac opacification. IMPRESSION: Operative planning with good colonic opacification. Electronically Signed   By: Marnee Spring M.D.   On: 04/01/2020 06:51   US THORACENTESIS ASP PLEURAL SPACE W/IMG GUIDE  Result Date: 03/31/2020 INDICATION: Patient with acute on chronic respiratory failure and pleural effusions. Interventional radiology asked to perform a therapeutic and diagnostic thoracentesis. EXAM: ULTRASOUND GUIDED THORACENTESIS MEDICATIONS: 1% lidocaine 10 mL COMPLICATIONS: None immediate. PROCEDURE: An ultrasound guided thoracentesis was thoroughly discussed with the patient and questions answered. The benefits, risks, alternatives and complications were also discussed. The patient understands and wishes to proceed with the procedure. Written consent was obtained. Ultrasound was performed to localize and mark an adequate pocket of fluid in the left chest. The area was then prepped and draped in the normal sterile fashion. 1% Lidocaine was used for local anesthesia.  Under ultrasound guidance a 6 Fr Safe-T-Centesis catheter was introduced. Thoracentesis was performed. The catheter was removed and a dressing applied. FINDINGS: A total of approximately 250 mL of light red fluid was removed. Samples were sent to the laboratory as requested by the clinical team. IMPRESSION: Successful ultrasound guided left thoracentesis yielding 250 mL of pleural fluid. Read by: Alwyn Ren, NP No pneumothorax on follow-up radiograph. Electronically Signed   By: Corlis Leak M.D.   On: 03/31/2020 12:14    Impression/Recommendations Active Problems: Acute on chronic respiratory failure with hypoxia, vent dependent Pneumonia with Pseudomonas Pleural effusion/left-sided empyema? UTI with Enterobacter Cardiac arrest (HCC) COPD, severe (HCC) Acute encephalopathy Dysphagia History of alcohol abuse Coronary artery disease involving native coronary artery of native heart Atrial fibrillation, rapid (HCC)  Acute on chronic respiratory failure with hypoxia: Patient continues to be ventilator dependent.  He had CT done on 03/30/2020 which per report moderate to large bilateral pleural effusions with near complete collapse of the left upper lobe.  Concern for aspiration/superimposed pneumonia.  Left-sided pleural effusion per report complex suggestive of empyema.  He was previously treated with ciprofloxacin and Flagyl.  Currently on treatment with IV vancomycin, meropenem.  He had thoracentesis with drainage of 250 mL done today from the left side.  Sent for cultures.  Follow-up on the cultures and adjust antibiotics accordingly.  We will plan to treat for tentative duration of 1 week pending improvement.  He also has dysphagia and concern for aspiration therefore high risk for recurrent and worsening pneumonia secondary to aspiration despite being on antibiotics.  Pulmonary following.  Pneumonia: Patient has had respiratory cultures recently which showed Pseudomonas.  Currently on empiric  IV vancomycin, meropenem.  Final culture still pending at this time.  Follow-up on the final results and adjust antibiotics accordingly.  Again, as mentioned above due to his dysphagia and concern for aspiration he is at risk for recurrent and worsening pneumonia.  Please monitor BUN/trending closely while on antibiotics.  Pleural effusion/left-sided empyema: He is status post drainage.  On antibiotics as mentioned above.  Follow-up on the final cultures and adjust antibiotics accordingly.  UTI: He had previous UTI with urine culture that  showed Enterobacter.  He is status post treatment with Cipro.  COPD: Patient reportedly has severe bullous emphysema.  Remains on the ventilator.  Pulmonary following.  Continue medication and management per primary team.  Unfortunately due to his underlying COPD it may be difficult to wean him.  Multivessel coronary artery disease with significant carotid artery disease: He is status post CABG x4+ left carotid endarterectomy.  Continue medications per the primary team.  Plavix on hold due to PEG placement today.  Acute encephalopathy: He has history of alcohol abuse.  On Celexa, Klonopin and low-dose Seroquel.  Also on thiamine, folate, multivitamin.  Continue supportive management per primary team.  Dysphagia: Unfortunately due to his dysphagia he is at risk for aspiration and worsening respiratory failure secondary to aspiration pneumonia.  Plan for PEG tube placement today.  Further management per primary team.  Atrial fibrillation: Continue rate control medications per the primary team.  Unfortunately due to his complex medical problems he is at risk for worsening and decompensation.  Thank you for this consultation.  Plan of care discussed with the primary team and pharmacy.  Vonzella Nipple M.D. 04/01/2020, 4:41 PM

## 2020-04-01 NOTE — Procedures (Signed)
Interventional Radiology Procedure Note  Procedure: Placement of percutaneous 20F pull-through gastrostomy tube. Complications: None Recommendations: - NPO except for sips and chips remainder of today and overnight - Maintain G-tube to LWS until tomorrow morning  - May advance diet as tolerated and begin using tube tomorrow morning  Signed,   Facundo Allemand S. Deklan Minar, DO   

## 2020-04-01 NOTE — Progress Notes (Addendum)
Pulmonary Critical Care Medicine [facility] [place]   PULMONARY CRITICAL CARE SERVICE  PROGRESS NOTE  Date of Service: 04/01/2020  LARS JEZIORSKI  NOM:[id number]  DOB: 1956/07/31   DOA: 03/04/2020  Referring Physician: Carron Curie, MD  HPI: NOHA KARASIK is a 64 y.o. male seen for follow up of Acute on Chronic Respiratory Failure. Patient remains on 40% ATC for a 16 hour goal today.    Medications: Reviewed on Rounds  Physical Exam:  Vitals: Pulse 83, resp 32, bp 118/63, o2 sat 99, temp 97.8  Ventilator Settings ATC 40%  . General: Comfortable at this time . Eyes: Grossly normal lids, irises & conjunctiva . ENT: grossly tongue is normal . Neck: no obvious mass . Cardiovascular: S1 S2 normal no gallop . Respiratory: no rales or ronchi noted . Abdomen: soft . Skin: no rash seen on limited exam . Musculoskeletal: not rigid . Psychiatric:unable to assess . Neurologic: no seizure no involuntary movements         Lab Data:   Basic Metabolic Panel: Recent Labs  Lab 03/26/20 0714 03/28/20 0626 03/30/20 0900 04/01/20 0711  NA 138 138 138 140  K 4.1 3.6 3.9 3.9  CL 95* 94* 93* 95*  CO2 36* 36* 37* 36*  GLUCOSE 97 115* 112* 114*  BUN 45* 35* 44* 47*  CREATININE 0.87 0.90 0.86 0.81  CALCIUM 8.3* 8.1* 8.3* 8.3*  MG 2.0 1.9 2.1 2.0  PHOS 2.8 3.0 3.5 3.0    ABG: No results for input(s): PHART, PCO2ART, PO2ART, HCO3, O2SAT in the last 168 hours.  Liver Function Tests: Recent Labs  Lab 03/26/20 1118 03/31/20 0855  PROT 4.8* 5.6*   No results for input(s): LIPASE, AMYLASE in the last 168 hours. No results for input(s): AMMONIA in the last 168 hours.  CBC: Recent Labs  Lab 03/26/20 0714 03/28/20 0626 03/30/20 0900 04/01/20 0711  WBC 4.8 4.3 6.6 5.7  HGB 8.0* 7.7* 8.6* 8.0*  HCT 26.6* 25.9* 28.2* 27.0*  MCV 93.7 92.5 92.5 92.8  PLT 151 172 210 205    Cardiac Enzymes: No results for input(s): CKTOTAL, CKMB, CKMBINDEX, TROPONINI in the last  168 hours.  BNP (last 3 results) Recent Labs    02/10/20 0355  BNP 675.6*    ProBNP (last 3 results) No results for input(s): PROBNP in the last 8760 hours.  Radiological Exams: CT CHEST WO CONTRAST  Result Date: 03/30/2020 CLINICAL DATA:  Empyema. EXAM: CT CHEST WITHOUT CONTRAST TECHNIQUE: Multidetector CT imaging of the chest was performed following the standard protocol without IV contrast. COMPARISON:  March 25, 2020 FINDINGS: Cardiovascular: The heart size is stable from prior study. There is a trace pericardial effusion. Coronary artery calcifications are noted. There are atherosclerotic changes of the thoracic aorta. Mediastinum/Nodes: --mild mediastinal adenopathy is again noted. --mild hilar adenopathy is noted. -- No axillary lymphadenopathy. -- No supraclavicular lymphadenopathy. -- Normal thyroid gland where visualized. -the enteric tube courses through the patient's esophagus and terminates below the left hemidiaphragm. Lungs/Pleura: There are moderate to large bilateral pleural effusions. The left-sided pleural effusion again appears somewhat complex. There is near complete collapse of the left lower lobe. There is a heterogeneous appearance of the collapsed lung which is somewhat similar to prior study. There is new extensive consolidation within the left upper lobe. Profound emphysematous changes are noted at the lung apices. There is architectural distortion within the left upper lobe which has significantly progressed across the patient's prior studies. The tracheostomy tube terminates above  the carina. There is stable consolidation in the right middle lobe. Upper Abdomen: There is extensive oral contrast within the patient's stomach causing streak artifact and limiting evaluation of the upper abdomen. Given the significant limitation, no acute abnormality was detected in the upper abdomen. Musculoskeletal: There are healing bilateral rib fractures. No definite acute displaced  fracture identified on this study. IMPRESSION: 1. Evaluation limited by lack of IV contrast and streak artifact from the patient's arms. 2. Persistent moderate to large bilateral pleural effusions with near complete collapse of the left lower lobe. Again noted is a heterogeneous appearance of the collapsed left lung which may be secondary to aspiration or superimposed pneumonia. The left-sided pleural effusion again appears somewhat complex suggestive of an empyema. 3. New extensive consolidation within the left upper lobe concerning for pneumonia or aspiration. Aortic Atherosclerosis (ICD10-I70.0) and Emphysema (ICD10-J43.9). Electronically Signed   By: Katherine Mantle M.D.   On: 03/30/2020 22:37   IR GASTROSTOMY TUBE MOD SED  Result Date: 04/01/2020 INDICATION: 64 year old male with dysphagia EXAM: PERC PLACEMENT GASTROSTOMY MEDICATIONS: Vancomycin 1 gm IV; Antibiotics were administered within 1 hour of the procedure. ANESTHESIA/SEDATION: Versed 1.0 mg IV; Fentanyl 25 mcg IV Moderate Sedation Time:  10 minutes The patient was continuously monitored during the procedure by the interventional radiology nurse under my direct supervision. CONTRAST:  47mL OMNIPAQUE IOHEXOL 300 MG/ML SOLN - administered into the gastric lumen. FLUOROSCOPY TIME:  Fluoroscopy Time: 1 minutes 42 seconds (4 mGy). COMPLICATIONS: None PROCEDURE: Informed written consent was obtained from the patient and the patient's family after a thorough discussion of the procedural risks, benefits and alternatives. All questions were addressed. Maximal Sterile Barrier Technique was utilized including caps, mask, sterile gowns, sterile gloves, sterile drape, hand hygiene and skin antiseptic. A timeout was performed prior to the initiation of the procedure. The epigastrium was prepped with Betadine in a sterile fashion, and a sterile drape was applied covering the operative field. A sterile gown and sterile gloves were used for the procedure. A  5-French orogastric tube is placed under fluoroscopic guidance. Scout imaging of the abdomen confirms barium within the transverse colon. The stomach was distended with gas. Under fluoroscopic guidance, an 18 gauge needle was utilized to puncture the anterior wall of the body of the stomach. An Amplatz wire was advanced through the needle passing a T fastener into the lumen of the stomach. The T fastener was secured for gastropexy. A 9-French sheath was inserted. A snare was advanced through the 9-French sheath. A Teena Dunk was advanced through the orogastric tube. It was snared then pulled out the oral cavity, pulling the snare, as well. The leading edge of the gastrostomy was attached to the snare. It was then pulled down the esophagus and out the percutaneous site. Tube secured in place. Contrast was injected. Patient tolerated the procedure well and remained hemodynamically stable throughout. No complications were encountered and no significant blood loss encountered. IMPRESSION: Status post fluoroscopic placed percutaneous gastrostomy tube, with 20 [place] pull-through. Signed, Yvone Neu. Loreta Ave, DO [facility] Radiology Electronically Signed   By: Gilmer Mor D.O.   On: 04/01/2020 17:12   DG CHEST PORT 1 VIEW  Result Date: 04/01/2020 CLINICAL DATA:  Breast tori failure and tracheostomy tube EXAM: PORTABLE CHEST 1 VIEW COMPARISON:  Yesterday FINDINGS: Tracheostomy and enteric tubes in place. Marked emphysema with left more than right pulmonary opacification and some lateral pleural fluid on the left. No visible air leak. No significant change from prior.  Prior median sternotomy. Heart size is normal IMPRESSION: 1. Extensive airspace disease superimposed on emphysema, similar to prior. 2. Lateral left pleural effusion which is more prominent than yesterday. Electronically Signed   By: Marnee Spring M.D.   On: 04/01/2020 06:52   DG CHEST PORT 1 VIEW  Result  Date: 03/31/2020 CLINICAL DATA:  Status post thoracentesis. EXAM: PORTABLE CHEST 1 VIEW COMPARISON:  03/26/2020 FINDINGS: Slight interval reduction in volume of a layering left pleural effusion. No significant pneumothorax. Unchanged heterogeneous airspace opacity and consolidation of the left lung. Severe emphysema. Tracheostomy. Esophagogastric tube with tip and side port below the diaphragm. Status post median sternotomy. IMPRESSION: 1. Slight interval reduction in volume of a layering left pleural effusion. No significant pneumothorax. Unchanged heterogeneous airspace opacity and consolidation of the left lung. 2.  Emphysema. 3.  Tracheostomy. Electronically Signed   By: Lauralyn Primes M.D.   On: 03/31/2020 11:30   DG Abd Portable 1V  Result Date: 04/01/2020 CLINICAL DATA:  For PEG placement.: Position EXAM: PORTABLE ABDOMEN - 1 VIEW COMPARISON:  03/29/2020 FINDINGS: Good opacification of the colon showing a low transverse segment. Some enteric contrast is seen at the level of the stomach with indwelling gastric suction tube. Retrocardiac opacification. IMPRESSION: Operative planning with good colonic opacification. Electronically Signed   By: Marnee Spring M.D.   On: 04/01/2020 06:51   US THORACENTESIS ASP PLEURAL SPACE W/IMG GUIDE  Result Date: 03/31/2020 INDICATION: Patient with acute on chronic respiratory failure and pleural effusions. Interventional radiology asked to perform a therapeutic and diagnostic thoracentesis. EXAM: ULTRASOUND GUIDED THORACENTESIS MEDICATIONS: 1% lidocaine 10 mL COMPLICATIONS: None immediate. PROCEDURE: An ultrasound guided thoracentesis was thoroughly discussed with the patient and questions answered. The benefits, risks, alternatives and complications were also discussed. The patient understands and wishes to proceed with the procedure. Written consent was obtained. Ultrasound was performed to localize and mark an adequate pocket of fluid in the left chest. The area was  then prepped and draped in the normal sterile fashion. 1% Lidocaine was used for local anesthesia. Under ultrasound guidance a 6 Fr Safe-T-Centesis catheter was introduced. Thoracentesis was performed. The catheter was removed and a dressing applied. FINDINGS: A total of approximately 250 mL of light red fluid was removed. Samples were sent to the laboratory as requested by the clinical team. IMPRESSION: Successful ultrasound guided left thoracentesis yielding 250 mL of pleural fluid. Read by: Alwyn Ren, NP No pneumothorax on follow-up radiograph. Electronically Signed   By: Corlis Leak M.D.   On: 03/31/2020 12:14    Assessment/Plan Active Problems:   Acute on chronic respiratory failure with hypoxia (HCC)   Cardiac arrest (HCC)   COPD, severe (HCC)   Coronary artery disease involving native coronary artery of native heart   Atrial fibrillation, rapid (HCC)   1. Acute on chronic respiratory failure hypoxia continue on ATC 40% for 16 hour goal.  Continue supportive measures at this time. 2. Cardiac arrest rhythm is stable we will continue to follow 3. Severe COPD at baseline 4. Coronary artery disease supportive care 5. Atrial fibrillation rate controlled   I have personally seen and evaluated the patient, evaluated laboratory and imaging results, formulated the assessment and plan and placed orders. The Patient requires high complexity decision making with multiple systems involvement.  Rounds were done with the Respiratory Therapy Director and Staff therapists and discussed with nursing staff also.  Yevonne Pax, MD [facility] Pulmonary Critical Care Medicine Sleep Medicine

## 2020-04-02 LAB — CULTURE, RESPIRATORY W GRAM STAIN

## 2020-04-02 LAB — VANCOMYCIN, TROUGH: Vancomycin Tr: 14 ug/mL — ABNORMAL LOW (ref 15–20)

## 2020-04-02 NOTE — Progress Notes (Addendum)
Referring Physician(s): C. Dierdre SearlesLi NP  Supervising Physician: Oley BalmHassell, Daniel  [facility]  [name]  Chief Complaint: Dysphagia  Subjective:  64 y.o. male inpatient ([facility]). History of acute on chronic respiratory failure with dysphagia. IR placed a gastrostomy tube 20 Fr on 7.26.21 with pre procedural barium with Dr. [name]. Patient alert and laying in bed, calm and comfortable.  Denies any fevers, headache, chest pain, SOB, cough, abdominal pain, nausea, vomiting or bleeding. Patient requesting water. Bedside RN made aware.     Allergies: Patient has no known allergies.  Medications: Prior to Admission medications   Medication Sig Start Date End Date Taking? Authorizing Provider  alum & mag hydroxide-simeth (MAALOX/MYLANTA) 200-200-20 MG/5ML suspension Place 15 mLs into feeding tube every 6 (six) hours as needed for indigestion or heartburn. 03/04/20   Leary Rocaoddenberry, Myron G, PA-C  amiodarone (PACERONE) 200 MG tablet Place 1 tablet (200 mg total) into feeding tube 2 (two) times daily. 03/04/20   Leary Rocaoddenberry, Myron G, PA-C  arformoterol (BROVANA) 15 MCG/2ML NEBU Take 2 mLs (15 mcg total) by nebulization 2 (two) times daily. 03/04/20   Leary Rocaoddenberry, Myron G, PA-C  aspirin 81 MG chewable tablet Place 1 tablet (81 mg total) into feeding tube daily. 03/05/20   Leary Rocaoddenberry, Myron G, PA-C  atorvastatin (LIPITOR) 10 MG tablet Place 1 tablet (10 mg total) into feeding tube daily. 03/05/20   Leary Rocaoddenberry, Myron G, PA-C  budesonide (PULMICORT) 0.25 MG/2ML nebulizer solution Take 2 mLs (0.25 mg total) by nebulization 2 (two) times daily. 03/04/20   Leary Rocaoddenberry, Myron G, PA-C  citalopram (CELEXA) 20 MG tablet Place 1 tablet (20 mg total) into feeding tube daily. 03/05/20   Leary Rocaoddenberry, Myron G, PA-C  clonazePAM (KLONOPIN) 2 MG tablet Place 1 tablet (2 mg total) into feeding tube 2 (two) times daily. 03/04/20   Leary Rocaoddenberry, Myron G, PA-C  clopidogrel (PLAVIX) 75 MG tablet Place 1 tablet (75 mg total)  into feeding tube daily. 03/05/20   Leary Rocaoddenberry, Myron G, PA-C  collagenase (SANTYL) ointment Apply topically daily. 03/05/20   Leary Rocaoddenberry, Myron G, PA-C  docusate (COLACE) 50 MG/5ML liquid Place 20 mLs (200 mg total) into feeding tube daily. 03/05/20   Leary Rocaoddenberry, Myron G, PA-C  doxazosin (CARDURA) 4 MG tablet Take 1 tablet (4 mg total) by mouth daily. 03/04/20   Leary Rocaoddenberry, Myron G, PA-C  enoxaparin (LOVENOX) 40 MG/0.4ML injection Inject 0.4 mLs (40 mg total) into the skin daily. 03/05/20   Leary Rocaoddenberry, Myron G, PA-C  ferrous sulfate 300 (60 Fe) MG/5ML syrup Place 5 mLs (300 mg total) into feeding tube 2 (two) times daily with a meal. 03/04/20   [name], Tony AmsterdamMyron G, PA-C  finasteride (PROSCAR) 5 MG tablet Take 1 tablet (5 mg total) by mouth daily. 03/05/20   Leary Rocaoddenberry, Myron G, PA-C  folic acid (FOLVITE) 1 MG tablet Place 1 tablet (1 mg total) into feeding tube daily. 03/05/20   Leary Rocaoddenberry, Myron G, PA-C  insulin aspart (NOVOLOG) 100 UNIT/ML injection Inject 0-24 Units into the skin every 4 (four) hours. 03/04/20   [name], Tony AmsterdamMyron G, PA-C  melatonin 5 MG TABS Place 1 tablet (5 mg total) into feeding tube at bedtime as needed (insomnia). 03/04/20   Leary Rocaoddenberry, Myron G, PA-C  midodrine (PROAMATINE) 5 MG tablet Place 1 tablet (5 mg total) into feeding tube 3 (three) times daily with meals. 03/04/20   Leary Rocaoddenberry, Myron G, PA-C  Multiple Vitamin (MULTIVITAMIN) LIQD Place 15 mLs into feeding tube daily. 03/05/20   Leary Rocaoddenberry, Myron G, PA-C  nutrition  supplement, JUVEN, (JUVEN) PACK Place 1 packet into feeding tube 2 (two) times daily between meals. 03/04/20   Leary Roca, PA-C  Nutritional Supplements (FEEDING SUPPLEMENT, VITAL AF 1.2 CAL,) LIQD Place 1,000 mLs into feeding tube continuous. 03/04/20   Leary Roca, PA-C  ondansetron (ZOFRAN) 4 MG/2ML SOLN injection Inject 2 mLs (4 mg total) into the vein every 6 (six) hours as needed for nausea or vomiting. 03/04/20   Leary Roca,  PA-C  oxyCODONE (ROXICODONE) 5 MG/5ML solution Place 5 mLs (5 mg total) into feeding tube every 4 (four) hours as needed for severe pain. 03/04/20   Leary Roca, PA-C  pantoprazole sodium (PROTONIX) 40 mg/20 mL PACK Place 20 mLs (40 mg total) into feeding tube daily. 03/05/20   Leary Roca, PA-C  QUEtiapine (SEROQUEL) 25 MG tablet Place 1 tablet (25 mg total) into feeding tube 2 (two) times daily. 03/04/20   Leary Roca, PA-C  revefenacin (YUPELRI) 175 MCG/3ML nebulizer solution Take 3 mLs (175 mcg total) by nebulization daily. 03/05/20   Leary Roca, PA-C  sodium chloride flush (NS) 0.9 % SOLN 10-40 mLs by Intracatheter route every 12 (twelve) hours. 03/04/20   Leary Roca, PA-C  sodium chloride flush (NS) 0.9 % SOLN 10-40 mLs by Intracatheter route as needed (flush). 03/04/20   Leary Roca, PA-C  thiamine 100 MG tablet Place 1 tablet (100 mg total) into feeding tube daily. 03/05/20   Leary Roca, PA-C     Vital Signs: BP (!) 120/62 (BP Location: Right Arm)   Pulse (!) 112   Resp (!) 26   SpO2 98%   Physical Exam Vitals and nursing note reviewed.  Constitutional:      Appearance: He is well-developed. He is ill-appearing.  HENT:     Head: Normocephalic.  Pulmonary:     Effort: Pulmonary effort is normal.     Comments: Patient is trached.  Abdominal:     Comments: Insertion site is clean, dry, dressed and appropriately tender to palpation. No bleeding or leakage noted.   Skin:    General: Skin is dry.  Neurological:     Mental Status: He is alert.     Imaging: CT CHEST WO CONTRAST  Result Date: 03/30/2020 CLINICAL DATA:  Empyema. EXAM: CT CHEST WITHOUT CONTRAST TECHNIQUE: Multidetector CT imaging of the chest was performed following the standard protocol without IV contrast. COMPARISON:  March 25, 2020 FINDINGS: Cardiovascular: The heart size is stable from prior study. There is a trace pericardial effusion. Coronary artery  calcifications are noted. There are atherosclerotic changes of the thoracic aorta. Mediastinum/Nodes: --mild mediastinal adenopathy is again noted. --mild hilar adenopathy is noted. -- No axillary lymphadenopathy. -- No supraclavicular lymphadenopathy. -- Normal thyroid gland where visualized. -the enteric tube courses through the patient's esophagus and terminates below the left hemidiaphragm. Lungs/Pleura: There are moderate to large bilateral pleural effusions. The left-sided pleural effusion again appears somewhat complex. There is near complete collapse of the left lower lobe. There is a heterogeneous appearance of the collapsed lung which is somewhat similar to prior study. There is new extensive consolidation within the left upper lobe. Profound emphysematous changes are noted at the lung apices. There is architectural distortion within the left upper lobe which has significantly progressed across the patient's prior studies. The tracheostomy tube terminates above the carina. There is stable consolidation in the right middle lobe. Upper Abdomen: There is extensive oral contrast within the patient's stomach causing streak artifact and  limiting evaluation of the upper abdomen. Given the significant limitation, no acute abnormality was detected in the upper abdomen. Musculoskeletal: There are healing bilateral rib fractures. No definite acute displaced fracture identified on this study. IMPRESSION: 1. Evaluation limited by lack of IV contrast and streak artifact from the patient's arms. 2. Persistent moderate to large bilateral pleural effusions with near complete collapse of the left lower lobe. Again noted is a heterogeneous appearance of the collapsed left lung which may be secondary to aspiration or superimposed pneumonia. The left-sided pleural effusion again appears somewhat complex suggestive of an empyema. 3. New extensive consolidation within the left upper lobe concerning for pneumonia or aspiration.  Aortic Atherosclerosis (ICD10-I70.0) and Emphysema (ICD10-J43.9). Electronically Signed   By: Katherine Mantle M.D.   On: 03/30/2020 22:37   IR GASTROSTOMY TUBE MOD SED  Result Date: 04/01/2020 INDICATION: 64 year old male with dysphagia EXAM: PERC PLACEMENT GASTROSTOMY MEDICATIONS: Vancomycin 1 gm IV; Antibiotics were administered within 1 hour of the procedure. ANESTHESIA/SEDATION: Versed 1.0 mg IV; Fentanyl 25 mcg IV Moderate Sedation Time:  10 minutes The patient was continuously monitored during the procedure by the interventional radiology nurse under my direct supervision. CONTRAST:  29mL OMNIPAQUE IOHEXOL 300 MG/ML SOLN - administered into the gastric lumen. FLUOROSCOPY TIME:  Fluoroscopy Time: 1 minutes 42 seconds (4 mGy). COMPLICATIONS: None PROCEDURE: Informed written consent was obtained from the patient and the patient's family after a thorough discussion of the procedural risks, benefits and alternatives. All questions were addressed. Maximal Sterile Barrier Technique was utilized including caps, mask, sterile gowns, sterile gloves, sterile drape, hand hygiene and skin antiseptic. A timeout was performed prior to the initiation of the procedure. The epigastrium was prepped with Betadine in a sterile fashion, and a sterile drape was applied covering the operative field. A sterile gown and sterile gloves were used for the procedure. A 5-French orogastric tube is placed under fluoroscopic guidance. Scout imaging of the abdomen confirms barium within the transverse colon. The stomach was distended with gas. Under fluoroscopic guidance, an 18 gauge needle was utilized to puncture the anterior wall of the body of the stomach. An Amplatz wire was advanced through the needle passing a T fastener into the lumen of the stomach. The T fastener was secured for gastropexy. A 9-French sheath was inserted. A snare was advanced through the 9-French sheath. A Teena Dunk was advanced through the orogastric tube. It  was snared then pulled out the oral cavity, pulling the snare, as well. The leading edge of the gastrostomy was attached to the snare. It was then pulled down the esophagus and out the percutaneous site. Tube secured in place. Contrast was injected. Patient tolerated the procedure well and remained hemodynamically stable throughout. No complications were encountered and no significant blood loss encountered. IMPRESSION: Status post fluoroscopic placed percutaneous gastrostomy tube, with 20 [place] pull-through. Signed, Yvone Neu. Loreta Ave, DO [facility] Electronically Signed   By: Gilmer Mor D.O.   On: 04/01/2020 17:12   DG CHEST PORT 1 VIEW  Result Date: 04/01/2020 CLINICAL DATA:  Breast tori failure and tracheostomy tube EXAM: PORTABLE CHEST 1 VIEW COMPARISON:  Yesterday FINDINGS: Tracheostomy and enteric tubes in place. Marked emphysema with left more than right pulmonary opacification and some lateral pleural fluid on the left. No visible air leak. No significant change from prior. Prior median sternotomy. Heart size is normal IMPRESSION: 1. Extensive airspace disease superimposed on emphysema, similar to prior. 2. Lateral left pleural effusion which is more  prominent than yesterday. Electronically Signed   By: Marnee Spring M.D.   On: 04/01/2020 06:52   DG CHEST PORT 1 VIEW  Result Date: 03/31/2020 CLINICAL DATA:  Status post thoracentesis. EXAM: PORTABLE CHEST 1 VIEW COMPARISON:  03/26/2020 FINDINGS: Slight interval reduction in volume of a layering left pleural effusion. No significant pneumothorax. Unchanged heterogeneous airspace opacity and consolidation of the left lung. Severe emphysema. Tracheostomy. Esophagogastric tube with tip and side port below the diaphragm. Status post median sternotomy. IMPRESSION: 1. Slight interval reduction in volume of a layering left pleural effusion. No significant pneumothorax. Unchanged heterogeneous  airspace opacity and consolidation of the left lung. 2.  Emphysema. 3.  Tracheostomy. Electronically Signed   By: Lauralyn Primes M.D.   On: 03/31/2020 11:30   DG Abd Portable 1V  Result Date: 04/01/2020 CLINICAL DATA:  For PEG placement.: Position EXAM: PORTABLE ABDOMEN - 1 VIEW COMPARISON:  03/29/2020 FINDINGS: Good opacification of the colon showing a low transverse segment. Some enteric contrast is seen at the level of the stomach with indwelling gastric suction tube. Retrocardiac opacification. IMPRESSION: Operative planning with good colonic opacification. Electronically Signed   By: Marnee Spring M.D.   On: 04/01/2020 06:51   DG Abd Portable 1V  Result Date: 03/29/2020 CLINICAL DATA:  Nasogastric tube placement. EXAM: PORTABLE ABDOMEN - 1 VIEW COMPARISON:  March 16, 2020 FINDINGS: A nasogastric tube is seen with its distal tip overlying the expected region of the body of the stomach. The bowel gas pattern is normal. Multiple sternal wires are seen. Moderate to marked severity areas of atelectasis and/or infiltrate are seen within the bilateral lung bases. Small bilateral pleural effusions are also noted. IMPRESSION: 1. Nasogastric tube positioning, as described above. 2. Moderate to marked severity bibasilar atelectasis and/or infiltrate. 3. Small bilateral pleural effusions. Electronically Signed   By: Aram Candela M.D.   On: 03/29/2020 21:35   US THORACENTESIS ASP PLEURAL SPACE W/IMG GUIDE  Result Date: 03/31/2020 INDICATION: Patient with acute on chronic respiratory failure and pleural effusions. Interventional radiology asked to perform a therapeutic and diagnostic thoracentesis. EXAM: ULTRASOUND GUIDED THORACENTESIS MEDICATIONS: 1% lidocaine 10 mL COMPLICATIONS: None immediate. PROCEDURE: An ultrasound guided thoracentesis was thoroughly discussed with the patient and questions answered. The benefits, risks, alternatives and complications were also discussed. The patient understands and  wishes to proceed with the procedure. Written consent was obtained. Ultrasound was performed to localize and mark an adequate pocket of fluid in the left chest. The area was then prepped and draped in the normal sterile fashion. 1% Lidocaine was used for local anesthesia. Under ultrasound guidance a 6 Fr Safe-T-Centesis catheter was introduced. Thoracentesis was performed. The catheter was removed and a dressing applied. FINDINGS: A total of approximately 250 mL of light red fluid was removed. Samples were sent to the laboratory as requested by the clinical team. IMPRESSION: Successful ultrasound guided left thoracentesis yielding 250 mL of pleural fluid. Read by: Alwyn Ren, NP No pneumothorax on follow-up radiograph. Electronically Signed   By: Corlis Leak M.D.   On: 03/31/2020 12:14    Labs:  CBC: Recent Labs    03/26/20 0714 03/28/20 0626 03/30/20 0900 04/01/20 0711  WBC 4.8 4.3 6.6 5.7  HGB 8.0* 7.7* 8.6* 8.0*  HCT 26.6* 25.9* 28.2* 27.0*  PLT 151 172 210 205    COAGS: Recent Labs    02/02/20 0956 02/06/20 1528 03/05/20 1240 03/06/20 0632  INR 1.0 1.8* 1.2 1.1  APTT 29 41*  --   --  BMP: Recent Labs    03/26/20 0714 03/28/20 0626 03/30/20 0900 04/01/20 0711  NA 138 138 138 140  K 4.1 3.6 3.9 3.9  CL 95* 94* 93* 95*  CO2 36* 36* 37* 36*  GLUCOSE 97 115* 112* 114*  BUN 45* 35* 44* 47*  CALCIUM 8.3* 8.1* 8.3* 8.3*  CREATININE 0.87 0.90 0.86 0.81  GFRNONAA >60 >60 >60 >60  GFRAA >60 >60 >60 >60    LIVER FUNCTION TESTS: Recent Labs    02/15/20 0448 02/15/20 0448 02/19/20 0308 02/19/20 0308 02/20/20 0420 03/05/20 0558 03/21/20 1015 03/26/20 1118 03/31/20 0855  BILITOT 1.3*  --  0.8  --  0.9 0.7  --   --   --   AST 23  --  74*  --  49* 41  --   --   --   ALT 43  --  100*  --  89* 61*  --   --   --   ALKPHOS 42  --  76  --  102 141*  --   --   --   PROT 4.2*   < > 5.1*   < > 5.3* 5.5*  --  4.8* 5.6*  ALBUMIN 2.3*   < > 2.1*  --  2.2* 2.1* 1.6*  --    --    < > = values in this interval not displayed.    Assessment and Plan:  64 y.o, male inpatient ([facility]). History of acute on chronic respiratory failure with dysphagia. IR placed a gastrostomy tube 20 Fr on 7.26.21 with pre procedural barium with Dr. Loreta Ave.   Insertion site is clean, dry, dressed and appropriately tender to palpation. Gastrosotmy tube is currently placed to kangaroo pump/ Per bedside RN there are no issues or concerns.    Please call IR for any questions or concerns regarding the Munoz-tube.    Electronically Signed: Alene Mires, NP 04/02/2020, 11:02 AM   I spent a total of 25 Minutes at the patient's bedside AND on the patient's hospital floor or unit, greater than 50% of which was counseling/coordinating care for Gastrostomy tube placement

## 2020-04-03 NOTE — Progress Notes (Addendum)
Pulmonary Critical Care Medicine [facility] [place]   PULMONARY CRITICAL CARE SERVICE  PROGRESS NOTE  Date of Service: 04/02/2020  Tony Munoz  XVQ:[id number]  DOB: 1956-08-20   DOA: 03/04/2020  Referring Physician: Carron Curie, MD  HPI: Tony Munoz is a 64 y.o. male seen for follow up of Acute on Chronic Respiratory Failure.  Patient remains on ATC 40% for 20-hour goal today patient PEG tube placed yesterday currently satting well no distress.  Medications: Reviewed on Rounds  Physical Exam:  Vitals: Pulse 76 respirations 28 BP 101/59 O2 sat 97% temp 98.4  Ventilator Settings ATC 40%  . General: Comfortable at this time . Eyes: Grossly normal lids, irises & conjunctiva . ENT: grossly tongue is normal . Neck: no obvious mass . Cardiovascular: S1 S2 normal no gallop . Respiratory: No rales or rhonchi noted . Abdomen: soft . Skin: no rash seen on limited exam . Musculoskeletal: not rigid . Psychiatric:unable to assess . Neurologic: no seizure no involuntary movements         Lab Data:   Basic Metabolic Panel: Recent Labs  Lab 03/28/20 0626 03/30/20 0900 04/01/20 0711  NA 138 138 140  K 3.6 3.9 3.9  CL 94* 93* 95*  CO2 36* 37* 36*  GLUCOSE 115* 112* 114*  BUN 35* 44* 47*  CREATININE 0.90 0.86 0.81  CALCIUM 8.1* 8.3* 8.3*  MG 1.9 2.1 2.0  PHOS 3.0 3.5 3.0    ABG: No results for input(s): PHART, PCO2ART, PO2ART, HCO3, O2SAT in the last 168 hours.  Liver Function Tests: Recent Labs  Lab 03/31/20 0855  PROT 5.6*   No results for input(s): LIPASE, AMYLASE in the last 168 hours. No results for input(s): AMMONIA in the last 168 hours.  CBC: Recent Labs  Lab 03/28/20 0626 03/30/20 0900 04/01/20 0711  WBC 4.3 6.6 5.7  HGB 7.7* 8.6* 8.0*  HCT 25.9* 28.2* 27.0*  MCV 92.5 92.5 92.8  PLT 172 210 205    Cardiac Enzymes: No results for input(s): CKTOTAL, CKMB, CKMBINDEX, TROPONINI in the last 168 hours.  BNP (last 3 results) Recent  Labs    02/10/20 0355  BNP 675.6*    ProBNP (last 3 results) No results for input(s): PROBNP in the last 8760 hours.  Radiological Exams: No results found.  Assessment/Plan Active Problems:   Acute on chronic respiratory failure with hypoxia (HCC)   Cardiac arrest (HCC)   COPD, severe (HCC)   Coronary artery disease involving native coronary artery of native heart   Atrial fibrillation, rapid (HCC)   1. Acute on chronic respiratory failure hypoxiacontinue on ATC 40% for 20 hour goal.    Currently satting well.  We will continue aggressive pulmonary toilet supportive measures. 2. Cardiac arrest rhythm is stable we will continue to follow 3. Severe COPD at baseline 4. Coronary artery disease supportive care 5. Atrial fibrillation rate controlled   I have personally seen and evaluated the patient, evaluated laboratory and imaging results, formulated the assessment and plan and placed orders. The Patient requires high complexity decision making with multiple systems involvement.  Rounds were done with the Respiratory Therapy Director and Staff therapists and discussed with nursing staff also.  Yevonne Pax, MD [facility] Pulmonary Critical Care Medicine Sleep Medicine

## 2020-04-03 NOTE — Progress Notes (Addendum)
Pulmonary Critical Care Medicine [facility] [place]   PULMONARY CRITICAL CARE SERVICE  PROGRESS NOTE  Date of Service: 04/03/2020  Tony Munoz  PFX:[id number]  DOB: May 01, 1956   DOA: 03/04/2020  Referring Physician: Carron Curie, MD  HPI: Tony Munoz is a 64 y.o. male seen for follow up of Acute on Chronic Respiratory Failure.  Patient remains on aerosol trach collar 35% FiO2 for goal of 48 hours at this time currently satting well no fever distress.  Medications: Reviewed on Rounds  Physical Exam:  Vitals: Pulse 91 respirations 26 BP 138/75 O2 sat 96% temp 97.2  Ventilator Settings ATC 35%  . General: Comfortable at this time . Eyes: Grossly normal lids, irises & conjunctiva . ENT: grossly tongue is normal . Neck: no obvious mass . Cardiovascular: S1 S2 normal no gallop . Respiratory: No rales or rhonchi noted . Abdomen: soft . Skin: no rash seen on limited exam . Musculoskeletal: not rigid . Psychiatric:unable to assess . Neurologic: no seizure no involuntary movements         Lab Data:   Basic Metabolic Panel: Recent Labs  Lab 03/28/20 0626 03/30/20 0900 04/01/20 0711  NA 138 138 140  K 3.6 3.9 3.9  CL 94* 93* 95*  CO2 36* 37* 36*  GLUCOSE 115* 112* 114*  BUN 35* 44* 47*  CREATININE 0.90 0.86 0.81  CALCIUM 8.1* 8.3* 8.3*  MG 1.9 2.1 2.0  PHOS 3.0 3.5 3.0    ABG: No results for input(s): PHART, PCO2ART, PO2ART, HCO3, O2SAT in the last 168 hours.  Liver Function Tests: Recent Labs  Lab 03/31/20 0855  PROT 5.6*   No results for input(s): LIPASE, AMYLASE in the last 168 hours. No results for input(s): AMMONIA in the last 168 hours.  CBC: Recent Labs  Lab 03/28/20 0626 03/30/20 0900 04/01/20 0711  WBC 4.3 6.6 5.7  HGB 7.7* 8.6* 8.0*  HCT 25.9* 28.2* 27.0*  MCV 92.5 92.5 92.8  PLT 172 210 205    Cardiac Enzymes: No results for input(s): CKTOTAL, CKMB, CKMBINDEX, TROPONINI in the last 168 hours.  BNP (last 3  results) Recent Labs    02/10/20 0355  BNP 675.6*    ProBNP (last 3 results) No results for input(s): PROBNP in the last 8760 hours.  Radiological Exams: No results found.  Assessment/Plan Active Problems:   Acute on chronic respiratory failure with hypoxia (HCC)   Cardiac arrest (HCC)   COPD, severe (HCC)   Coronary artery disease involving native coronary artery of native heart   Atrial fibrillation, rapid (HCC)   1. Acute on chronic respiratory failure hypoxiacontinue on ATC 40% for48 hour goal.  Continue supportive measures at this time. 2. Cardiac arrest rhythm is stable we will continue to follow 3. Severe COPD at baseline 4. Coronary artery disease supportive care 5. Atrial fibrillation rate controlled   I have personally seen and evaluated the patient, evaluated laboratory and imaging results, formulated the assessment and plan and placed orders. The Patient requires high complexity decision making with multiple systems involvement.  Rounds were done with the Respiratory Therapy Director and Staff therapists and discussed with nursing staff also.  Yevonne Pax, MD [facility] Pulmonary Critical Care Medicine Sleep Medicine

## 2020-04-04 NOTE — Progress Notes (Addendum)
Pulmonary Critical Care Medicine [facility] [place]   PULMONARY CRITICAL CARE SERVICE  PROGRESS NOTE  Date of Service: 04/04/2020  CIPRIANO MILLIKAN  AVW:[id number]  DOB: 10/15/1955   DOA: 03/04/2020  Referring Physician: Carron Curie, MD  HPI: Tony Munoz is a 64 y.o. male seen for follow up of Acute on Chronic Respiratory Failure.  Patient remains on aerosol trach collar 35% FiO2 has completed 48 hours at this time remains well with no fever or distress.  Medications: Reviewed on Rounds  Physical Exam:  Vitals: Pulse 92 respirations 18 BP 147/76 O2 sat 95% temp 98.0  Ventilator Settings ATC 35%  . General: Comfortable at this time . Eyes: Grossly normal lids, irises & conjunctiva . ENT: grossly tongue is normal . Neck: no obvious mass . Cardiovascular: S1 S2 normal no gallop . Respiratory: No rales or rhonchi noted . Abdomen: soft . Skin: no rash seen on limited exam . Musculoskeletal: not rigid . Psychiatric:unable to assess . Neurologic: no seizure no involuntary movements         Lab Data:   Basic Metabolic Panel: Recent Labs  Lab 03/30/20 0900 04/01/20 0711  NA 138 140  K 3.9 3.9  CL 93* 95*  CO2 37* 36*  GLUCOSE 112* 114*  BUN 44* 47*  CREATININE 0.86 0.81  CALCIUM 8.3* 8.3*  MG 2.1 2.0  PHOS 3.5 3.0    ABG: No results for input(s): PHART, PCO2ART, PO2ART, HCO3, O2SAT in the last 168 hours.  Liver Function Tests: Recent Labs  Lab 03/31/20 0855  PROT 5.6*   No results for input(s): LIPASE, AMYLASE in the last 168 hours. No results for input(s): AMMONIA in the last 168 hours.  CBC: Recent Labs  Lab 03/30/20 0900 04/01/20 0711  WBC 6.6 5.7  HGB 8.6* 8.0*  HCT 28.2* 27.0*  MCV 92.5 92.8  PLT 210 205    Cardiac Enzymes: No results for input(s): CKTOTAL, CKMB, CKMBINDEX, TROPONINI in the last 168 hours.  BNP (last 3 results) Recent Labs    02/10/20 0355  BNP 675.6*    ProBNP (last 3 results) No results for  input(s): PROBNP in the last 8760 hours.  Radiological Exams: No results found.  Assessment/Plan Active Problems:   Acute on chronic respiratory failure with hypoxia (HCC)   Cardiac arrest (HCC)   COPD, severe (HCC)   Coronary artery disease involving native coronary artery of native heart   Atrial fibrillation, rapid (HCC)   1. Acute on chronic respiratory failure hypoxiapatient remains on aerosol trach collar currently 35% FiO2 has completed 48 hours at this time satting well.  Will continue aggressive pulmonary toilet supportive measures 2. Cardiac arrest rhythm is stable we will continue to follow 3. Severe COPD at baseline 4. Coronary artery disease supportive care 5. Atrial fibrillation rate controlled   I have personally seen and evaluated the patient, evaluated laboratory and imaging results, formulated the assessment and plan and placed orders. The Patient requires high complexity decision making with multiple systems involvement.  Rounds were done with the Respiratory Therapy Director and Staff therapists and discussed with nursing staff also.  Yevonne Pax, MD [facility] Pulmonary Critical Care Medicine Sleep Medicine

## 2020-04-05 ENCOUNTER — Other Ambulatory Visit: Payer: Self-pay | Admitting: Cardiothoracic Surgery

## 2020-04-05 DIAGNOSIS — Z951 Presence of aortocoronary bypass graft: Secondary | ICD-10-CM

## 2020-04-05 LAB — VANCOMYCIN, TROUGH: Vancomycin Tr: 15 ug/mL (ref 15–20)

## 2020-04-05 NOTE — Progress Notes (Addendum)
Pulmonary Critical Care Medicine [facility] [place]   PULMONARY CRITICAL CARE SERVICE  PROGRESS NOTE  Date of Service: 04/05/2020  Tony Munoz  HAL:[id number]  DOB: Sep 05, 1956   DOA: 03/04/2020  Referring Physician: Carron Curie, MD  HPI: Tony Munoz is a 64 y.o. male seen for follow up of Acute on Chronic Respiratory Failure.  Patient has now completed 72 hours on aerosol trach collar currently 35% FiO2 distress noted.  Medications: Reviewed on Rounds  Physical Exam:  Vitals: Pulse 82 respirations 19 BP 107/62 O2 sat 99% temp 98.7  Ventilator Settings ATC 35%  . General: Comfortable at this time . Eyes: Grossly normal lids, irises & conjunctiva . ENT: grossly tongue is normal . Neck: no obvious mass . Cardiovascular: S1 S2 normal no gallop . Respiratory: No rales or rhonchi noted . Abdomen: soft . Skin: no rash seen on limited exam . Musculoskeletal: not rigid . Psychiatric:unable to assess . Neurologic: no seizure no involuntary movements         Lab Data:   Basic Metabolic Panel: Recent Labs  Lab 03/30/20 0900 04/01/20 0711  NA 138 140  K 3.9 3.9  CL 93* 95*  CO2 37* 36*  GLUCOSE 112* 114*  BUN 44* 47*  CREATININE 0.86 0.81  CALCIUM 8.3* 8.3*  MG 2.1 2.0  PHOS 3.5 3.0    ABG: No results for input(s): PHART, PCO2ART, PO2ART, HCO3, O2SAT in the last 168 hours.  Liver Function Tests: Recent Labs  Lab 03/31/20 0855  PROT 5.6*   No results for input(s): LIPASE, AMYLASE in the last 168 hours. No results for input(s): AMMONIA in the last 168 hours.  CBC: Recent Labs  Lab 03/30/20 0900 04/01/20 0711  WBC 6.6 5.7  HGB 8.6* 8.0*  HCT 28.2* 27.0*  MCV 92.5 92.8  PLT 210 205    Cardiac Enzymes: No results for input(s): CKTOTAL, CKMB, CKMBINDEX, TROPONINI in the last 168 hours.  BNP (last 3 results) Recent Labs    02/10/20 0355  BNP 675.6*    ProBNP (last 3 results) No results for input(s): PROBNP in the last 8760  hours.  Radiological Exams: No results found.  Assessment/Plan Active Problems:   Acute on chronic respiratory failure with hypoxia (HCC)   Cardiac arrest (HCC)   COPD, severe (HCC)   Coronary artery disease involving native coronary artery of native heart   Atrial fibrillation, rapid (HCC)   1. Acute on chronic respiratory failure hypoxiapatient doing very well on T-bar 35% at this time has completed 72 hours.  Will continue aggressive pulmonary toilet supportive measures. 2. Cardiac arrest rhythm is stable we will continue to follow 3. Severe COPD at baseline 4. Coronary artery disease supportive care 5. Atrial fibrillation rate controlled   I have personally seen and evaluated the patient, evaluated laboratory and imaging results, formulated the assessment and plan and placed orders. The Patient requires high complexity decision making with multiple systems involvement.  Rounds were done with the Respiratory Therapy Director and Staff therapists and discussed with nursing staff also.  Yevonne Pax, MD [facility] Pulmonary Critical Care Medicine Sleep Medicine

## 2020-04-06 LAB — CBC
HCT: 28.5 % — ABNORMAL LOW (ref 39.0–52.0)
Hemoglobin: 8.5 g/dL — ABNORMAL LOW (ref 13.0–17.0)
MCH: 27.6 pg (ref 26.0–34.0)
MCHC: 29.8 g/dL — ABNORMAL LOW (ref 30.0–36.0)
MCV: 92.5 fL (ref 80.0–100.0)
Platelets: 234 10*3/uL (ref 150–400)
RBC: 3.08 MIL/uL — ABNORMAL LOW (ref 4.22–5.81)
RDW: 17.2 % — ABNORMAL HIGH (ref 11.5–15.5)
WBC: 4.5 10*3/uL (ref 4.0–10.5)
nRBC: 0 % (ref 0.0–0.2)

## 2020-04-06 LAB — BASIC METABOLIC PANEL
Anion gap: 8 (ref 5–15)
BUN: 40 mg/dL — ABNORMAL HIGH (ref 8–23)
CO2: 33 mmol/L — ABNORMAL HIGH (ref 22–32)
Calcium: 8.2 mg/dL — ABNORMAL LOW (ref 8.9–10.3)
Chloride: 99 mmol/L (ref 98–111)
Creatinine, Ser: 0.78 mg/dL (ref 0.61–1.24)
GFR calc Af Amer: >60 mL/min (ref >60)
GFR calc non Af Amer: >60 mL/min (ref >60)
Glucose, Bld: 126 mg/dL — ABNORMAL HIGH (ref 70–99)
Potassium: 3.7 mmol/L (ref 3.5–5.1)
Sodium: 140 mmol/L (ref 135–145)

## 2020-04-06 LAB — MAGNESIUM: Magnesium: 2.1 mg/dL (ref 1.7–2.4)

## 2020-04-06 NOTE — Progress Notes (Signed)
Pulmonary Critical Care Medicine [facility] [place]   PULMONARY CRITICAL CARE SERVICE  PROGRESS NOTE  Date of Service: 04/06/2020  Tony Munoz  UVO:[id number]  DOB: 06/08/1956   DOA: 03/04/2020  Referring Physician: Carron Curie, MD  HPI: Tony Munoz is a 64 y.o. male seen for follow up of Acute on Chronic Respiratory Failure.  Patient currently is on T collar has been on 35% FiO2 secretions have been copious limiting for weaning  Medications: Reviewed on Rounds  Physical Exam:  Vitals: Temperature is 98.7 pulse 81 respiratory rate 14 blood pressure is 122/61 saturations 99%  Ventilator Settings on T collar FiO2 35%  . General: Comfortable at this time . Eyes: Grossly normal lids, irises & conjunctiva . ENT: grossly tongue is normal . Neck: no obvious mass . Cardiovascular: S1 S2 normal no gallop . Respiratory: Scattered coarse rhonchi . Abdomen: soft . Skin: no rash seen on limited exam . Musculoskeletal: not rigid . Psychiatric:unable to assess . Neurologic: no seizure no involuntary movements         Lab Data:   Basic Metabolic Panel: Recent Labs  Lab 04/01/20 0711 04/06/20 0413  NA 140 140  K 3.9 3.7  CL 95* 99  CO2 36* 33*  GLUCOSE 114* 126*  BUN 47* 40*  CREATININE 0.81 0.78  CALCIUM 8.3* 8.2*  MG 2.0 2.1  PHOS 3.0  --     ABG: No results for input(s): PHART, PCO2ART, PO2ART, HCO3, O2SAT in the last 168 hours.  Liver Function Tests: Recent Labs  Lab 03/31/20 0855  PROT 5.6*   No results for input(s): LIPASE, AMYLASE in the last 168 hours. No results for input(s): AMMONIA in the last 168 hours.  CBC: Recent Labs  Lab 04/01/20 0711 04/06/20 0413  WBC 5.7 4.5  HGB 8.0* 8.5*  HCT 27.0* 28.5*  MCV 92.8 92.5  PLT 205 234    Cardiac Enzymes: No results for input(s): CKTOTAL, CKMB, CKMBINDEX, TROPONINI in the last 168 hours.  BNP (last 3 results) Recent Labs    02/10/20 0355  BNP 675.6*    ProBNP (last 3  results) No results for input(s): PROBNP in the last 8760 hours.  Radiological Exams: No results found.  Assessment/Plan Active Problems:   Acute on chronic respiratory failure with hypoxia (HCC)   Cardiac arrest (HCC)   COPD, severe (HCC)   Coronary artery disease involving native coronary artery of native heart   Atrial fibrillation, rapid (HCC)   1. Acute on chronic respiratory failure hypoxia we will continue with T collar trials titrate oxygen continue pulmonary toilet. 2. Cardiac arrest rhythm has been stable we will continue to monitor 3. Severe COPD at baseline continue with present management 4. Coronary artery disease at baseline 5. Atrial fibrillation rate is controlled we will continue to monitor   I have personally seen and evaluated the patient, evaluated laboratory and imaging results, formulated the assessment and plan and placed orders. The Patient requires high complexity decision making with multiple systems involvement.  Rounds were done with the Respiratory Therapy Director and Staff therapists and discussed with nursing staff also.  Yevonne Pax, MD [facility] Pulmonary Critical Care Medicine Sleep Medicine

## 2020-04-07 NOTE — Progress Notes (Signed)
Pulmonary Critical Care Medicine [facility] [place]   PULMONARY CRITICAL CARE SERVICE  PROGRESS NOTE  Date of Service: 04/07/2020  Tony Munoz  DJM:[id number]  DOB: [birth year]   DOA: 03/04/2020  Referring Physician: Carron Curie, MD  HPI: Tony Munoz is a 64 y.o. male seen for follow up of Acute on Chronic Respiratory Failure.  Patient is off the ventilator right now on 35% FiO2 has been on T collar good saturations are noted  Medications: Reviewed on Rounds  Physical Exam:  Vitals: Temperature is 98.7 pulse 77 respiratory 22 blood pressure is 106/59 saturations 96%  Ventilator Settings on T collar with an FiO2 of 35%  . General: Comfortable at this time . Eyes: Grossly normal lids, irises & conjunctiva . ENT: grossly tongue is normal . Neck: no obvious mass . Cardiovascular: S1 S2 normal no gallop . Respiratory: Scattered rhonchi coarse breath sounds . Abdomen: soft . Skin: no rash seen on limited exam . Musculoskeletal: not rigid . Psychiatric:unable to assess . Neurologic: no seizure no involuntary movements         Lab Data:   Basic Metabolic Panel: Recent Labs  Lab 04/01/20 0711 04/06/20 0413  NA 140 140  K 3.9 3.7  CL 95* 99  CO2 36* 33*  GLUCOSE 114* 126*  BUN 47* 40*  CREATININE 0.81 0.78  CALCIUM 8.3* 8.2*  MG 2.0 2.1  PHOS 3.0  --     ABG: No results for input(s): PHART, PCO2ART, PO2ART, HCO3, O2SAT in the last 168 hours.  Liver Function Tests: No results for input(s): AST, ALT, ALKPHOS, BILITOT, PROT, ALBUMIN in the last 168 hours. No results for input(s): LIPASE, AMYLASE in the last 168 hours. No results for input(s): AMMONIA in the last 168 hours.  CBC: Recent Labs  Lab 04/01/20 0711 04/06/20 0413  WBC 5.7 4.5  HGB 8.0* 8.5*  HCT 27.0* 28.5*  MCV 92.8 92.5  PLT 205 234    Cardiac Enzymes: No results for input(s): CKTOTAL, CKMB, CKMBINDEX, TROPONINI in the last 168 hours.  BNP (last 3 results) Recent Labs     02/10/20 0355  BNP 675.6*    ProBNP (last 3 results) No results for input(s): PROBNP in the last 8760 hours.  Radiological Exams: No results found.  Assessment/Plan Active Problems:   Acute on chronic respiratory failure with hypoxia (HCC)   Cardiac arrest (HCC)   COPD, severe (HCC)   Coronary artery disease involving native coronary artery of native heart   Atrial fibrillation, rapid (HCC)   1. Acute on chronic respiratory failure hypoxia right now is on T collar FiO2 35% titrate down as tolerated 2. Cardiac arrest rhythm has been stable 3. Severe COPD no change we will continue present management 4. Coronary artery disease supportive care 5. Atrial fibrillation rate is controlled   I have personally seen and evaluated the patient, evaluated laboratory and imaging results, formulated the assessment and plan and placed orders. The Patient requires high complexity decision making with multiple systems involvement.  Rounds were done with the Respiratory Therapy Director and Staff therapists and discussed with nursing staff also.  Yevonne Pax, MD [facility] Pulmonary Critical Care Medicine Sleep Medicine

## 2020-04-08 ENCOUNTER — Other Ambulatory Visit (HOSPITAL_COMMUNITY): Payer: Medicare HMO

## 2020-04-08 ENCOUNTER — Encounter: Payer: Self-pay | Admitting: Cardiothoracic Surgery

## 2020-04-08 NOTE — Progress Notes (Signed)
  This encounter was created in error - please disregard./patient NS'ed appt 

## 2020-04-08 NOTE — Progress Notes (Signed)
Pulmonary Critical Care Medicine [facility] [place]   PULMONARY CRITICAL CARE SERVICE  PROGRESS NOTE  Date of Service: 04/08/2020  Tony Munoz  [id number]:[id number]  DOB: 1956/04/20   DOA: 03/04/2020  Referring Physician: Carron Curie, MD  HPI: Tony Munoz is a 64 y.o. male seen for follow up of Acute on Chronic Respiratory Failure.  Patient is on T collar currently on 35% FiO2 good saturations are noted at this time  Medications: Reviewed on Rounds  Physical Exam:  Vitals: Temperature is 98.7 pulse 74 respiratory rate 17 blood pressure is 106/54 saturations 98%  Ventilator Settings on T collar FiO2 35%  . General: Comfortable at this time . Eyes: Grossly normal lids, irises & conjunctiva . ENT: grossly tongue is normal . Neck: no obvious mass . Cardiovascular: S1 S2 normal no gallop . Respiratory: No rhonchi coarse breath sounds . Abdomen: soft . Skin: no rash seen on limited exam . Musculoskeletal: not rigid . Psychiatric:unable to assess . Neurologic: no seizure no involuntary movements         Lab Data:   Basic Metabolic Panel: Recent Labs  Lab 04/06/20 0413  NA 140  K 3.7  CL 99  CO2 33*  GLUCOSE 126*  BUN 40*  CREATININE 0.78  CALCIUM 8.2*  MG 2.1    ABG: No results for input(s): PHART, PCO2ART, PO2ART, HCO3, O2SAT in the last 168 hours.  Liver Function Tests: No results for input(s): AST, ALT, ALKPHOS, BILITOT, PROT, ALBUMIN in the last 168 hours. No results for input(s): LIPASE, AMYLASE in the last 168 hours. No results for input(s): AMMONIA in the last 168 hours.  CBC: Recent Labs  Lab 04/06/20 0413  WBC 4.5  HGB 8.5*  HCT 28.5*  MCV 92.5  PLT 234    Cardiac Enzymes: No results for input(s): CKTOTAL, CKMB, CKMBINDEX, TROPONINI in the last 168 hours.  BNP (last 3 results) Recent Labs    02/10/20 0355  BNP 675.6*    ProBNP (last 3 results) No results for input(s): PROBNP in the last 8760 hours.  Radiological  Exams: DG CHEST PORT 1 VIEW  Result Date: 04/08/2020 CLINICAL DATA:  Pneumonia follow-up EXAM: PORTABLE CHEST 1 VIEW COMPARISON:  04/01/2020 FINDINGS: Tracheostomy tube in place.  The enteric tube has been removed. Advanced emphysema with bullae. Left more than right pulmonary opacity which is both airspace disease and layering pleural fluid by 03/30/2020 CT. Normal heart size. No visible pneumothorax. Continued volume loss on the left. IMPRESSION: Stable pulmonary opacification and pleural effusions superimposed on advanced emphysema. Electronically Signed   By: Marnee Spring M.D.   On: 04/08/2020 06:58    Assessment/Plan Active Problems:   Acute on chronic respiratory failure with hypoxia (HCC)   Cardiac arrest (HCC)   COPD, severe (HCC)   Coronary artery disease involving native coronary artery of native heart   Atrial fibrillation, rapid (HCC)   1. Acute on chronic respiratory failure with hypoxia we will continue with T collar trials patient is currently on 35% FiO2. 2. Cardiac arrest rhythm is stable at this time we will continue supportive care 3. Severe COPD at baseline 4. Coronary artery disease at baseline we will continue to follow 5. Rapid atrial fibrillation rate controlled   I have personally seen and evaluated the patient, evaluated laboratory and imaging results, formulated the assessment and plan and placed orders. The Patient requires high complexity decision making with multiple systems involvement.  Rounds were done with the Respiratory Therapy Director and Staff  therapists and discussed with nursing staff also.  Allyne Gee, MD [facility] Pulmonary Critical Care Medicine Sleep Medicine

## 2020-04-09 LAB — PHOSPHORUS: Phosphorus: 3.2 mg/dL (ref 2.5–4.6)

## 2020-04-09 LAB — MAGNESIUM: Magnesium: 2.3 mg/dL (ref 1.7–2.4)

## 2020-04-09 LAB — BASIC METABOLIC PANEL
Anion gap: 9 (ref 5–15)
BUN: 48 mg/dL — ABNORMAL HIGH (ref 8–23)
CO2: 30 mmol/L (ref 22–32)
Calcium: 8.6 mg/dL — ABNORMAL LOW (ref 8.9–10.3)
Chloride: 98 mmol/L (ref 98–111)
Creatinine, Ser: 0.74 mg/dL (ref 0.61–1.24)
GFR calc Af Amer: >60 mL/min (ref >60)
GFR calc non Af Amer: >60 mL/min (ref >60)
Glucose, Bld: 107 mg/dL — ABNORMAL HIGH (ref 70–99)
Potassium: 4.3 mmol/L (ref 3.5–5.1)
Sodium: 137 mmol/L (ref 135–145)

## 2020-04-09 NOTE — Progress Notes (Signed)
Pulmonary Critical Care Medicine [facility] [place]   PULMONARY CRITICAL CARE SERVICE  PROGRESS NOTE  Date of Service: 04/09/2020  Tony Munoz  [id number]  DOB: April 10, 1956   DOA: 03/04/2020  Referring Physician: Carron Curie, MD  HPI: Tony Munoz is a 64 y.o. male seen for follow up of Acute on Chronic Respiratory Failure.  Patient is on T collar currently on 35% FiO2 with good saturations.  Appears to be doing little bit better however secretions still remain an issue  Medications: Reviewed on Rounds  Physical Exam:  Vitals: Temperature is 97.0 pulse 93 respiratory 17 blood pressure is 116/66 saturations 97%  Ventilator Settings on T collar FiO2 of 35%  . General: Comfortable at this time . Eyes: Grossly normal lids, irises & conjunctiva . ENT: grossly tongue is normal . Neck: no obvious mass . Cardiovascular: S1 S2 normal no gallop . Respiratory: No rhonchi coarse breath sounds . Abdomen: soft . Skin: no rash seen on limited exam . Musculoskeletal: not rigid . Psychiatric:unable to assess . Neurologic: no seizure no involuntary movements         Lab Data:   Basic Metabolic Panel: Recent Labs  Lab 04/06/20 0413 04/09/20 0632  NA 140 137  K 3.7 4.3  CL 99 98  CO2 33* 30  GLUCOSE 126* 107*  BUN 40* 48*  CREATININE 0.78 0.74  CALCIUM 8.2* 8.6*  MG 2.1 2.3  PHOS  --  3.2    ABG: No results for input(s): PHART, PCO2ART, PO2ART, HCO3, O2SAT in the last 168 hours.  Liver Function Tests: No results for input(s): AST, ALT, ALKPHOS, BILITOT, PROT, ALBUMIN in the last 168 hours. No results for input(s): LIPASE, AMYLASE in the last 168 hours. No results for input(s): AMMONIA in the last 168 hours.  CBC: Recent Labs  Lab 04/06/20 0413  WBC 4.5  HGB 8.5*  HCT 28.5*  MCV 92.5  PLT 234    Cardiac Enzymes: No results for input(s): CKTOTAL, CKMB, CKMBINDEX, TROPONINI in the last 168 hours.  BNP (last 3 results) Recent Labs     02/10/20 0355  BNP 675.6*    ProBNP (last 3 results) No results for input(s): PROBNP in the last 8760 hours.  Radiological Exams: DG CHEST PORT 1 VIEW  Result Date: 04/08/2020 CLINICAL DATA:  Pneumonia follow-up EXAM: PORTABLE CHEST 1 VIEW COMPARISON:  04/01/2020 FINDINGS: Tracheostomy tube in place.  The enteric tube has been removed. Advanced emphysema with bullae. Left more than right pulmonary opacity which is both airspace disease and layering pleural fluid by 03/30/2020 CT. Normal heart size. No visible pneumothorax. Continued volume loss on the left. IMPRESSION: Stable pulmonary opacification and pleural effusions superimposed on advanced emphysema. Electronically Signed   By: Marnee Spring M.D.   On: 04/08/2020 06:58    Assessment/Plan Active Problems:   Acute on chronic respiratory failure with hypoxia (HCC)   Cardiac arrest (HCC)   COPD, severe (HCC)   Coronary artery disease involving native coronary artery of native heart   Atrial fibrillation, rapid (HCC)   1. Acute on chronic respiratory failure with hypoxia Tony Munoz is on 35% FiO2 on T collar. 2. Cardiac arrest rhythm is stable we will continue to monitor 3. Severe COPD at baseline we will continue to follow 4. Coronary artery disease at baseline 5. Atrial fibrillation rate is controlled   I have personally seen and evaluated the patient, evaluated laboratory and imaging results, formulated the assessment and plan and placed orders. The Patient requires  high complexity decision making with multiple systems involvement.  Rounds were done with the Respiratory Therapy Director and Staff therapists and discussed with nursing staff also.  Allyne Gee, MD [facility] Pulmonary Critical Care Medicine Sleep Medicine

## 2020-04-10 NOTE — Progress Notes (Signed)
Pulmonary Critical Care Medicine [facility] [place]   PULMONARY CRITICAL CARE SERVICE  PROGRESS NOTE  Date of Service: 04/10/2020  Tony Munoz  [id number]  DOB: 04/05/1956   DOA: 03/04/2020  Referring Physician: Carron Curie, MD  HPI: Tony Munoz is a 64 y.o. male seen for follow up of Acute on Chronic Respiratory Failure.  Patient is right now on T collar has been on 35% FiO2 reportedly has thick copious secretions.  Medications: Reviewed on Rounds  Physical Exam:  Vitals: Temperature is 97.4 pulse 82 respiratory 25 blood pressure is 125/66 saturations 97%  Ventilator Settings on T collar FiO2 35%  . General: Comfortable at this time . Eyes: Grossly normal lids, irises & conjunctiva . ENT: grossly tongue is normal . Neck: no obvious mass . Cardiovascular: S1 S2 normal no gallop . Respiratory: No rhonchi coarse breath sounds . Abdomen: soft . Skin: no rash seen on limited exam . Musculoskeletal: not rigid . Psychiatric:unable to assess . Neurologic: no seizure no involuntary movements         Lab Data:   Basic Metabolic Panel: Recent Labs  Lab 04/06/20 0413 04/09/20 0632  NA 140 137  K 3.7 4.3  CL 99 98  CO2 33* 30  GLUCOSE 126* 107*  BUN 40* 48*  CREATININE 0.78 0.74  CALCIUM 8.2* 8.6*  MG 2.1 2.3  PHOS  --  3.2    ABG: No results for input(s): PHART, PCO2ART, PO2ART, HCO3, O2SAT in the last 168 hours.  Liver Function Tests: No results for input(s): AST, ALT, ALKPHOS, BILITOT, PROT, ALBUMIN in the last 168 hours. No results for input(s): LIPASE, AMYLASE in the last 168 hours. No results for input(s): AMMONIA in the last 168 hours.  CBC: Recent Labs  Lab 04/06/20 0413  WBC 4.5  HGB 8.5*  HCT 28.5*  MCV 92.5  PLT 234    Cardiac Enzymes: No results for input(s): CKTOTAL, CKMB, CKMBINDEX, TROPONINI in the last 168 hours.  BNP (last 3 results) Recent Labs    02/10/20 0355  BNP 675.6*    ProBNP (last 3 results) No  results for input(s): PROBNP in the last 8760 hours.  Radiological Exams: No results found.  Assessment/Plan Active Problems:   Acute on chronic respiratory failure with hypoxia (HCC)   Cardiac arrest (HCC)   COPD, severe (HCC)   Coronary artery disease involving native coronary artery of native heart   Atrial fibrillation, rapid (HCC)   1. Acute on chronic respiratory failure with hypoxia patient is at baseline with on T collar 35% secondary to the copious secretions. 2. Cardiac arrest rhythm has been stable we will continue to follow 3. Severe COPD at baseline 4. Coronary artery disease no change 5. Atrial fibrillation rate is controlled   I have personally seen and evaluated the patient, evaluated laboratory and imaging results, formulated the assessment and plan and placed orders. The Patient requires high complexity decision making with multiple systems involvement.  Rounds were done with the Respiratory Therapy Director and Staff therapists and discussed with nursing staff also.  Yevonne Pax, MD [facility] Pulmonary Critical Care Medicine Sleep Medicine

## 2020-04-11 ENCOUNTER — Other Ambulatory Visit (HOSPITAL_COMMUNITY): Payer: Medicare HMO

## 2020-04-11 LAB — CBC
HCT: 35 % — ABNORMAL LOW (ref 39.0–52.0)
Hemoglobin: 10.5 g/dL — ABNORMAL LOW (ref 13.0–17.0)
MCH: 26.9 pg (ref 26.0–34.0)
MCHC: 30 g/dL (ref 30.0–36.0)
MCV: 89.5 fL (ref 80.0–100.0)
Platelets: 265 10*3/uL (ref 150–400)
RBC: 3.91 MIL/uL — ABNORMAL LOW (ref 4.22–5.81)
RDW: 16.8 % — ABNORMAL HIGH (ref 11.5–15.5)
WBC: 5.5 10*3/uL (ref 4.0–10.5)
nRBC: 0 % (ref 0.0–0.2)

## 2020-04-11 LAB — BASIC METABOLIC PANEL
Anion gap: 9 (ref 5–15)
BUN: 44 mg/dL — ABNORMAL HIGH (ref 8–23)
CO2: 32 mmol/L (ref 22–32)
Calcium: 8.8 mg/dL — ABNORMAL LOW (ref 8.9–10.3)
Chloride: 95 mmol/L — ABNORMAL LOW (ref 98–111)
Creatinine, Ser: 0.78 mg/dL (ref 0.61–1.24)
GFR calc Af Amer: >60 mL/min (ref >60)
GFR calc non Af Amer: >60 mL/min (ref >60)
Glucose, Bld: 116 mg/dL — ABNORMAL HIGH (ref 70–99)
Potassium: 4.1 mmol/L (ref 3.5–5.1)
Sodium: 136 mmol/L (ref 135–145)

## 2020-04-11 LAB — MAGNESIUM: Magnesium: 2.1 mg/dL (ref 1.7–2.4)

## 2020-04-11 LAB — PHOSPHORUS: Phosphorus: 3.4 mg/dL (ref 2.5–4.6)

## 2020-04-11 NOTE — Progress Notes (Signed)
Pulmonary Critical Care Medicine [facility] [place]   PULMONARY CRITICAL CARE SERVICE  PROGRESS NOTE  Date of Service: 04/11/2020  Tony Munoz  [id number]:[id number]  DOB: 1956-08-24   DOA: 03/04/2020  Referring Physician: Carron Curie, MD  HPI: Tony Munoz is a 64 y.o. male seen for follow up of Acute on Chronic Respiratory Failure.  Patient currently is on T collar has been on 28% FiO2 with good saturations  Medications: Reviewed on Rounds  Physical Exam:  Vitals: Temperature is 96.7 pulse 77 respiratory 18 blood pressure is 118/70 saturations 98%  Ventilator Settings on T collar FiO2 28%  . General: Comfortable at this time . Eyes: Grossly normal lids, irises & conjunctiva . ENT: grossly tongue is normal . Neck: no obvious mass . Cardiovascular: S1 S2 normal no gallop . Respiratory: No rhonchi very coarse breath sounds . Abdomen: soft . Skin: no rash seen on limited exam . Musculoskeletal: not rigid . Psychiatric:unable to assess . Neurologic: no seizure no involuntary movements         Lab Data:   Basic Metabolic Panel: Recent Labs  Lab 04/06/20 0413 04/09/20 0632 04/11/20 0617  NA 140 137 136  K 3.7 4.3 4.1  CL 99 98 95*  CO2 33* 30 32  GLUCOSE 126* 107* 116*  BUN 40* 48* 44*  CREATININE 0.78 0.74 0.78  CALCIUM 8.2* 8.6* 8.8*  MG 2.1 2.3 2.1  PHOS  --  3.2 3.4    ABG: No results for input(s): PHART, PCO2ART, PO2ART, HCO3, O2SAT in the last 168 hours.  Liver Function Tests: No results for input(s): AST, ALT, ALKPHOS, BILITOT, PROT, ALBUMIN in the last 168 hours. No results for input(s): LIPASE, AMYLASE in the last 168 hours. No results for input(s): AMMONIA in the last 168 hours.  CBC: Recent Labs  Lab 04/06/20 0413 04/11/20 0617  WBC 4.5 5.5  HGB 8.5* 10.5*  HCT 28.5* 35.0*  MCV 92.5 89.5  PLT 234 265    Cardiac Enzymes: No results for input(s): CKTOTAL, CKMB, CKMBINDEX, TROPONINI in the last 168 hours.  BNP (last 3  results) Recent Labs    02/10/20 0355  BNP 675.6*    ProBNP (last 3 results) No results for input(s): PROBNP in the last 8760 hours.  Radiological Exams: DG CHEST PORT 1 VIEW  Result Date: 04/11/2020 CLINICAL DATA:  Pneumonia and pleural effusion EXAM: PORTABLE CHEST 1 VIEW COMPARISON:  04/08/2020 FINDINGS: Postoperative changes in the mediastinum and cervical spine. Endotracheal tube is unchanged in position. Mild cardiac enlargement. Emphysematous changes in the lungs with bilateral perihilar infiltrates. Small left pleural effusion. Multiple left rib fractures. No change since previous study. IMPRESSION: No significant interval change. Bilateral perihilar infiltrates and small left pleural effusion. Electronically Signed   By: Burman Nieves M.D.   On: 04/11/2020 06:22    Assessment/Plan Active Problems:   Acute on chronic respiratory failure with hypoxia [facility])   Cardiac arrest (HCC)   COPD, severe (HCC)   Coronary artery disease involving native coronary artery of native heart   Atrial fibrillation, rapid (HCC)   1. Acute on chronic respiratory failure with hypoxia on T collar FiO2 28% secretions are still moderate patient is at baseline 2. Cardiac arrest rhythm has been stable 3. Severe COPD at baseline we will monitor 4. Coronary artery disease medical management 5. Atrial fibrillation rate controlled   I have personally seen and evaluated the patient, evaluated laboratory and imaging results, formulated the assessment and plan and placed orders.  The Patient requires high complexity decision making with multiple systems involvement.  Rounds were done with the Respiratory Therapy Director and Staff therapists and discussed with nursing staff also.  Allyne Gee, MD [facility] Pulmonary Critical Care Medicine Sleep Medicine

## 2020-04-12 NOTE — Progress Notes (Addendum)
Pulmonary Critical Care Medicine [facility] [place]   PULMONARY CRITICAL CARE SERVICE  PROGRESS NOTE  Date of Service: 04/12/2020  Tony Munoz  [id number]:[id number]  DOB: Jul 28, 1956   DOA: 03/04/2020  Referring Physician: Carron Curie, MD  HPI: Tony Munoz is a 64 y.o. male seen for follow up of Acute on Chronic Respiratory Failure.  Patient continues on 30% T-bar at this time satting well no fever or distress.  Medications: Reviewed on Rounds  Physical Exam:  Vitals: Pulse 77 respirations 12 BP 117/65 O2 sat 99% temp 97 0  Ventilator Settings 30% T-bar  . General: Comfortable at this time . Eyes: Grossly normal lids, irises & conjunctiva . ENT: grossly tongue is normal . Neck: no obvious mass . Cardiovascular: S1 S2 normal no gallop . Respiratory: No rales or rhonchi noted . Abdomen: soft . Skin: no rash seen on limited exam . Musculoskeletal: not rigid . Psychiatric:unable to assess . Neurologic: no seizure no involuntary movements         Lab Data:   Basic Metabolic Panel: Recent Labs  Lab 04/06/20 0413 04/09/20 0632 04/11/20 0617  NA 140 137 136  K 3.7 4.3 4.1  CL 99 98 95*  CO2 33* 30 32  GLUCOSE 126* 107* 116*  BUN 40* 48* 44*  CREATININE 0.78 0.74 0.78  CALCIUM 8.2* 8.6* 8.8*  MG 2.1 2.3 2.1  PHOS  --  3.2 3.4    ABG: No results for input(s): PHART, PCO2ART, PO2ART, HCO3, O2SAT in the last 168 hours.  Liver Function Tests: No results for input(s): AST, ALT, ALKPHOS, BILITOT, PROT, ALBUMIN in the last 168 hours. No results for input(s): LIPASE, AMYLASE in the last 168 hours. No results for input(s): AMMONIA in the last 168 hours.  CBC: Recent Labs  Lab 04/06/20 0413 04/11/20 0617  WBC 4.5 5.5  HGB 8.5* 10.5*  HCT 28.5* 35.0*  MCV 92.5 89.5  PLT 234 265    Cardiac Enzymes: No results for input(s): CKTOTAL, CKMB, CKMBINDEX, TROPONINI in the last 168 hours.  BNP (last 3 results) Recent Labs    02/10/20 0355  BNP 675.6*     ProBNP (last 3 results) No results for input(s): PROBNP in the last 8760 hours.  Radiological Exams: DG CHEST PORT 1 VIEW  Result Date: 04/11/2020 CLINICAL DATA:  Pneumonia and pleural effusion EXAM: PORTABLE CHEST 1 VIEW COMPARISON:  04/08/2020 FINDINGS: Postoperative changes in the mediastinum and cervical spine. Endotracheal tube is unchanged in position. Mild cardiac enlargement. Emphysematous changes in the lungs with bilateral perihilar infiltrates. Small left pleural effusion. Multiple left rib fractures. No change since previous study. IMPRESSION: No significant interval change. Bilateral perihilar infiltrates and small left pleural effusion. Electronically Signed   By: Burman Nieves M.D.   On: 04/11/2020 06:22    Assessment/Plan Active Problems:   Acute on chronic respiratory failure with hypoxia [facility])   Cardiac arrest (HCC)   COPD, severe (HCC)   Coronary artery disease involving native coronary artery of native heart   Atrial fibrillation, rapid (HCC)   1. Acute on chronic respiratory failure with hypoxia on T collar FiO2 28% secretions are still moderate patient is at baseline 2. Cardiac arrest rhythm has been stable 3. Severe COPD at baseline we will monitor 4. Coronary artery disease medical management 5. Atrial fibrillation rate controlled   I have personally seen and evaluated the patient, evaluated laboratory and imaging results, formulated the assessment and plan and placed orders. The Patient requires high complexity  decision making with multiple systems involvement.  Rounds were done with the Respiratory Therapy Director and Staff therapists and discussed with nursing staff also.  Allyne Gee, MD [facility] Pulmonary Critical Care Medicine Sleep Medicine

## 2020-04-13 NOTE — Progress Notes (Signed)
Pulmonary Critical Care Medicine [facility] [place]   PULMONARY CRITICAL CARE SERVICE  PROGRESS NOTE  Date of Service: 04/13/2020  [name]  HYQ:[id number]  DOB: June 01, 1956   DOA: 03/04/2020  Referring Physician: Carron Curie, MD  HPI: Tony Munoz is a 64 y.o. male seen for follow up of Acute on Chronic Respiratory Failure.  Patient at this time is on T collar has been on 30% FiO2 secretions are still very copious  Medications: Reviewed on Rounds  Physical Exam:  Vitals: Temperature 97.4 pulse 77 respiratory rate 18 blood pressure is 112/64 saturations 96%  Ventilator Settings on T collar FiO2 30%  . General: Comfortable at this time . Eyes: Grossly normal lids, irises & conjunctiva . ENT: grossly tongue is normal . Neck: no obvious mass . Cardiovascular: S1 S2 normal no gallop . Respiratory: No rhonchi with no rales . Abdomen: soft . Skin: no rash seen on limited exam . Musculoskeletal: not rigid . Psychiatric:unable to assess . Neurologic: no seizure no involuntary movements         Lab Data:   Basic Metabolic Panel: Recent Labs  Lab 04/09/20 0632 04/11/20 0617  NA 137 136  K 4.3 4.1  CL 98 95*  CO2 30 32  GLUCOSE 107* 116*  BUN 48* 44*  CREATININE 0.74 0.78  CALCIUM 8.6* 8.8*  MG 2.3 2.1  PHOS 3.2 3.4    ABG: No results for input(s): PHART, PCO2ART, PO2ART, HCO3, O2SAT in the last 168 hours.  Liver Function Tests: No results for input(s): AST, ALT, ALKPHOS, BILITOT, PROT, ALBUMIN in the last 168 hours. No results for input(s): LIPASE, AMYLASE in the last 168 hours. No results for input(s): AMMONIA in the last 168 hours.  CBC: Recent Labs  Lab 04/11/20 0617  WBC 5.5  HGB 10.5*  HCT 35.0*  MCV 89.5  PLT 265    Cardiac Enzymes: No results for input(s): CKTOTAL, CKMB, CKMBINDEX, TROPONINI in the last 168 hours.  BNP (last 3 results) Recent Labs    02/10/20 0355  BNP 675.6*    ProBNP (last 3 results) No results for  input(s): PROBNP in the last 8760 hours.  Radiological Exams: No results found.  Assessment/Plan Active Problems:   Acute on chronic respiratory failure with hypoxia (HCC)   Cardiac arrest (HCC)   COPD, severe (HCC)   Coronary artery disease involving native coronary artery of native heart   Atrial fibrillation, rapid (HCC)   1. Acute on chronic respiratory failure with hypoxia plan is to continue with T collar trials titrate oxygen down as tolerated continue pulmonary toilet. 2. Cardiac arrest rhythm has been stable 3. Severe COPD no change we will continue to monitor 4. Coronary artery disease at baseline 5. Rapid atrial fibrillation rate controlled we will monitor   I have personally seen and evaluated the patient, evaluated laboratory and imaging results, formulated the assessment and plan and placed orders. The Patient requires high complexity decision making with multiple systems involvement.  Rounds were done with the Respiratory Therapy Director and Staff therapists and discussed with nursing staff also.  Yevonne Pax, MD [facility] Pulmonary Critical Care Medicine Sleep Medicine

## 2020-04-14 LAB — BASIC METABOLIC PANEL
Anion gap: 13 (ref 5–15)
BUN: 64 mg/dL — ABNORMAL HIGH (ref 8–23)
CO2: 29 mmol/L (ref 22–32)
Calcium: 9.2 mg/dL (ref 8.9–10.3)
Chloride: 95 mmol/L — ABNORMAL LOW (ref 98–111)
Creatinine, Ser: 0.93 mg/dL (ref 0.61–1.24)
GFR calc Af Amer: >60 mL/min (ref >60)
GFR calc non Af Amer: >60 mL/min (ref >60)
Glucose, Bld: 112 mg/dL — ABNORMAL HIGH (ref 70–99)
Potassium: 4.5 mmol/L (ref 3.5–5.1)
Sodium: 137 mmol/L (ref 135–145)

## 2020-04-14 LAB — CBC
HCT: 37.1 % — ABNORMAL LOW (ref 39.0–52.0)
Hemoglobin: 11.3 g/dL — ABNORMAL LOW (ref 13.0–17.0)
MCH: 27.4 pg (ref 26.0–34.0)
MCHC: 30.5 g/dL (ref 30.0–36.0)
MCV: 89.8 fL (ref 80.0–100.0)
Platelets: 284 10*3/uL (ref 150–400)
RBC: 4.13 MIL/uL — ABNORMAL LOW (ref 4.22–5.81)
RDW: 16.5 % — ABNORMAL HIGH (ref 11.5–15.5)
WBC: 6.5 10*3/uL (ref 4.0–10.5)
nRBC: 0 % (ref 0.0–0.2)

## 2020-04-14 LAB — PHOSPHORUS: Phosphorus: 3.8 mg/dL (ref 2.5–4.6)

## 2020-04-14 LAB — MAGNESIUM: Magnesium: 2.3 mg/dL (ref 1.7–2.4)

## 2020-04-15 NOTE — Progress Notes (Signed)
Pulmonary Critical Care Medicine [facility] [place]   PULMONARY CRITICAL CARE SERVICE  PROGRESS NOTE  Date of Service: 04/15/2020  Tony Munoz  [id number]  DOB: 17-Oct-1955   DOA: 03/04/2020  Referring Physician: Carron Curie, MD  HPI: Tony Munoz is a 64 y.o. male seen for follow up of Acute on Chronic Respiratory Failure.  Patient right now is on T collar still with copious secretions and is requiring frequent suctioning.  Currently is on 28% FiO2.  Medications: Reviewed on Rounds  Physical Exam:  Vitals: Temperature 96.0 pulse 81 respiratory rate 20 blood pressure is 131/58 saturations 99%  Ventilator Settings off the ventilator on T collar right now good saturations  . General: Comfortable at this time . Eyes: Grossly normal lids, irises & conjunctiva . ENT: grossly tongue is normal . Neck: no obvious mass . Cardiovascular: S1 S2 normal no gallop . Respiratory: No rhonchi very coarse breath sounds are noted . Abdomen: soft . Skin: no rash seen on limited exam . Musculoskeletal: not rigid . Psychiatric:unable to assess . Neurologic: no seizure no involuntary movements         Lab Data:   Basic Metabolic Panel: Recent Labs  Lab 04/09/20 0632 04/11/20 0617 04/14/20 0730  NA 137 136 137  K 4.3 4.1 4.5  CL 98 95* 95*  CO2 30 32 29  GLUCOSE 107* 116* 112*  BUN 48* 44* 64*  CREATININE 0.74 0.78 0.93  CALCIUM 8.6* 8.8* 9.2  MG 2.3 2.1 2.3  PHOS 3.2 3.4 3.8    ABG: No results for input(s): PHART, PCO2ART, PO2ART, HCO3, O2SAT in the last 168 hours.  Liver Function Tests: No results for input(s): AST, ALT, ALKPHOS, BILITOT, PROT, ALBUMIN in the last 168 hours. No results for input(s): LIPASE, AMYLASE in the last 168 hours. No results for input(s): AMMONIA in the last 168 hours.  CBC: Recent Labs  Lab 04/11/20 0617 04/14/20 0730  WBC 5.5 6.5  HGB 10.5* 11.3*  HCT 35.0* 37.1*  MCV 89.5 89.8  PLT 265 284    Cardiac Enzymes: No  results for input(s): CKTOTAL, CKMB, CKMBINDEX, TROPONINI in the last 168 hours.  BNP (last 3 results) Recent Labs    02/10/20 0355  BNP 675.6*    ProBNP (last 3 results) No results for input(s): PROBNP in the last 8760 hours.  Radiological Exams: No results found.  Assessment/Plan Active Problems:   Acute on chronic respiratory failure with hypoxia (HCC)   Cardiac arrest (HCC)   COPD, severe (HCC)   Coronary artery disease involving native coronary artery of native heart   Atrial fibrillation, rapid (HCC)   1. Acute on chronic respiratory failure with hypoxia we will continue with T collar on 28% FiO2 continue pulmonary toilet supportive care. 2. Cardiac arrest rhythm is stable we will continue to follow 3. Severe COPD no change continue present management 4. Coronary artery disease stable at this point will follow 5. Atrial fibrillation rate is controlled   I have personally seen and evaluated the patient, evaluated laboratory and imaging results, formulated the assessment and plan and placed orders. The Patient requires high complexity decision making with multiple systems involvement.  Rounds were done with the Respiratory Therapy Director and Staff therapists and discussed with nursing staff also.  Yevonne Pax, MD [facility] Pulmonary Critical Care Medicine Sleep Medicine

## 2020-04-16 NOTE — Progress Notes (Signed)
Pulmonary Critical Care Medicine [facility] [place]   PULMONARY CRITICAL CARE SERVICE  PROGRESS NOTE  Date of Service: 04/16/2020  Tony Munoz  [id number]:[id number]  DOB: [birth year]   DOA: 03/04/2020  Referring Physician: Carron Curie, MD  HPI: Tony Munoz is a 64 y.o. male seen for follow up of Acute on Chronic Respiratory Failure.  Patient currently is on T collar has been on 28% FiO2 has copious secretions noted  Medications: Reviewed on Rounds  Physical Exam:  Vitals: Temperature is 96.2 pulse 73 respiratory 19 blood pressure is 116/66 saturations 98%  Ventilator Settings on T collar FiO2 28%  . General: Comfortable at this time . Eyes: Grossly normal lids, irises & conjunctiva . ENT: grossly tongue is normal . Neck: no obvious mass . Cardiovascular: S1 S2 normal no gallop . Respiratory: No rhonchi no rales noted at this time . Abdomen: soft . Skin: no rash seen on limited exam . Musculoskeletal: not rigid . Psychiatric:unable to assess . Neurologic: no seizure no involuntary movements         Lab Data:   Basic Metabolic Panel: Recent Labs  Lab 04/11/20 0617 04/14/20 0730  NA 136 137  K 4.1 4.5  CL 95* 95*  CO2 32 29  GLUCOSE 116* 112*  BUN 44* 64*  CREATININE 0.78 0.93  CALCIUM 8.8* 9.2  MG 2.1 2.3  PHOS 3.4 3.8    ABG: No results for input(s): PHART, PCO2ART, PO2ART, HCO3, O2SAT in the last 168 hours.  Liver Function Tests: No results for input(s): AST, ALT, ALKPHOS, BILITOT, PROT, ALBUMIN in the last 168 hours. No results for input(s): LIPASE, AMYLASE in the last 168 hours. No results for input(s): AMMONIA in the last 168 hours.  CBC: Recent Labs  Lab 04/11/20 0617 04/14/20 0730  WBC 5.5 6.5  HGB 10.5* 11.3*  HCT 35.0* 37.1*  MCV 89.5 89.8  PLT 265 284    Cardiac Enzymes: No results for input(s): CKTOTAL, CKMB, CKMBINDEX, TROPONINI in the last 168 hours.  BNP (last 3 results) Recent Labs    02/10/20 0355  BNP 675.6*     ProBNP (last 3 results) No results for input(s): PROBNP in the last 8760 hours.  Radiological Exams: No results found.  Assessment/Plan Active Problems:   Acute on chronic respiratory failure with hypoxia (HCC)   Cardiac arrest (HCC)   COPD, severe (HCC)   Coronary artery disease involving native coronary artery of native heart   Atrial fibrillation, rapid (HCC)   1. Acute on chronic respiratory failure with hypoxia plan is to continue with T collar trials titrate oxygen continue pulmonary toilet.  Not a candidate for decannulation secondary to the inability to handle secretions 2. Cardiac arrest rhythm is stable we will continue with supportive care 3. Severe COPD at baseline 4. Coronary artery disease medical management 5. Atrial fibrillation rate controlled   I have personally seen and evaluated the patient, evaluated laboratory and imaging results, formulated the assessment and plan and placed orders. The Patient requires high complexity decision making with multiple systems involvement.  Rounds were done with the Respiratory Therapy Director and Staff therapists and discussed with nursing staff also.  Yevonne Pax, MD [facility] Pulmonary Critical Care Medicine Sleep Medicine

## 2020-04-17 NOTE — Progress Notes (Addendum)
Pulmonary Critical Care Medicine [facility] [place]   PULMONARY CRITICAL CARE SERVICE  PROGRESS NOTE  Date of Service: 04/17/2020  Tony Munoz  [id number]  DOB: 25-Apr-1956   DOA: 03/04/2020  Referring Physician: Carron Curie, MD  HPI: Tony Munoz is a 64 y.o. male seen for follow up of Acute on Chronic Respiratory Failure.  Patient remains on 20% aerosol trach collar has copious amount of secretions at this time  Medications: Reviewed on Rounds  Physical Exam:  Vitals: Pulse 70 respirations 21 BP 108/61 O2 sat 96% temp 96.7  Ventilator Settings 20% ATC  . General: Comfortable at this time . Eyes: Grossly normal lids, irises & conjunctiva . ENT: grossly tongue is normal . Neck: no obvious mass . Cardiovascular: S1 S2 normal no gallop . Respiratory: No rales or rhonchi noted . Abdomen: soft . Skin: no rash seen on limited exam . Musculoskeletal: not rigid . Psychiatric:unable to assess . Neurologic: no seizure no involuntary movements         Lab Data:   Basic Metabolic Panel: Recent Labs  Lab 04/11/20 0617 04/14/20 0730  NA 136 137  K 4.1 4.5  CL 95* 95*  CO2 32 29  GLUCOSE 116* 112*  BUN 44* 64*  CREATININE 0.78 0.93  CALCIUM 8.8* 9.2  MG 2.1 2.3  PHOS 3.4 3.8    ABG: No results for input(s): PHART, PCO2ART, PO2ART, HCO3, O2SAT in the last 168 hours.  Liver Function Tests: No results for input(s): AST, ALT, ALKPHOS, BILITOT, PROT, ALBUMIN in the last 168 hours. No results for input(s): LIPASE, AMYLASE in the last 168 hours. No results for input(s): AMMONIA in the last 168 hours.  CBC: Recent Labs  Lab 04/11/20 0617 04/14/20 0730  WBC 5.5 6.5  HGB 10.5* 11.3*  HCT 35.0* 37.1*  MCV 89.5 89.8  PLT 265 284    Cardiac Enzymes: No results for input(s): CKTOTAL, CKMB, CKMBINDEX, TROPONINI in the last 168 hours.  BNP (last 3 results) Recent Labs    02/10/20 0355  BNP 675.6*    ProBNP (last 3 results) No results for  input(s): PROBNP in the last 8760 hours.  Radiological Exams: No results found.  Assessment/Plan Active Problems:   Acute on chronic respiratory failure with hypoxia (HCC)   Cardiac arrest (HCC)   COPD, severe (HCC)   Coronary artery disease involving native coronary artery of native heart   Atrial fibrillation, rapid (HCC)   1. Acute on chronic respiratory failure with hypoxia we will continue with T collar trials at this time.  Continue aggressive pulmonary toilet supportive measures. 2. Cardiac arrest rhythm is stable we will continue with supportive care 3. Severe COPD at baseline 4. Coronary artery disease medical management 5. Atrial fibrillation rate controlled   I have personally seen and evaluated the patient, evaluated laboratory and imaging results, formulated the assessment and plan and placed orders. The Patient requires high complexity decision making with multiple systems involvement.  Rounds were done with the Respiratory Therapy Director and Staff therapists and discussed with nursing staff also.  Yevonne Pax, MD [facility] Pulmonary Critical Care Medicine Sleep Medicine

## 2020-04-18 LAB — CBC
HCT: 33.2 % — ABNORMAL LOW (ref 39.0–52.0)
Hemoglobin: 9.8 g/dL — ABNORMAL LOW (ref 13.0–17.0)
MCH: 26.4 pg (ref 26.0–34.0)
MCHC: 29.5 g/dL — ABNORMAL LOW (ref 30.0–36.0)
MCV: 89.5 fL (ref 80.0–100.0)
Platelets: 284 10*3/uL (ref 150–400)
RBC: 3.71 MIL/uL — ABNORMAL LOW (ref 4.22–5.81)
RDW: 16.3 % — ABNORMAL HIGH (ref 11.5–15.5)
WBC: 5.6 10*3/uL (ref 4.0–10.5)
nRBC: 0 % (ref 0.0–0.2)

## 2020-04-18 LAB — BASIC METABOLIC PANEL
Anion gap: 10 (ref 5–15)
BUN: 61 mg/dL — ABNORMAL HIGH (ref 8–23)
CO2: 35 mmol/L — ABNORMAL HIGH (ref 22–32)
Calcium: 9.1 mg/dL (ref 8.9–10.3)
Chloride: 97 mmol/L — ABNORMAL LOW (ref 98–111)
Creatinine, Ser: 0.88 mg/dL (ref 0.61–1.24)
GFR calc Af Amer: >60 mL/min (ref >60)
GFR calc non Af Amer: >60 mL/min (ref >60)
Glucose, Bld: 119 mg/dL — ABNORMAL HIGH (ref 70–99)
Potassium: 3.6 mmol/L (ref 3.5–5.1)
Sodium: 142 mmol/L (ref 135–145)

## 2020-04-18 LAB — MAGNESIUM: Magnesium: 2.4 mg/dL (ref 1.7–2.4)

## 2020-04-18 NOTE — Progress Notes (Signed)
Pulmonary Critical Care Medicine [facility] [place]   PULMONARY CRITICAL CARE SERVICE  PROGRESS NOTE  Date of Service: 04/18/2020  Tony Munoz  [id number]:[id number]  DOB: [birth year]   DOA: 03/04/2020  Referring Physician: Carron Curie, MD  HPI: Tony Munoz is a 64 y.o. male seen for follow up of Acute on Chronic Respiratory Failure.  Patient currently is on T collar on 28% FiO2 still has copious secretions remains at baseline  Medications: Reviewed on Rounds  Physical Exam:  Vitals: Temperature 97.3 pulse 77 respiratory 23 blood pressure is 127/70 saturations 98%  Ventilator Settings on T collar FiO2 28%  . General: Comfortable at this time . Eyes: Grossly normal lids, irises & conjunctiva . ENT: grossly tongue is normal . Neck: no obvious mass . Cardiovascular: S1 S2 normal no gallop . Respiratory: No rhonchi coarse breath sounds . Abdomen: soft . Skin: no rash seen on limited exam . Musculoskeletal: not rigid . Psychiatric:unable to assess . Neurologic: no seizure no involuntary movements         Lab Data:   Basic Metabolic Panel: Recent Labs  Lab 04/14/20 0730 04/18/20 0505  NA 137 142  K 4.5 3.6  CL 95* 97*  CO2 29 35*  GLUCOSE 112* 119*  BUN 64* 61*  CREATININE 0.93 0.88  CALCIUM 9.2 9.1  MG 2.3 2.4  PHOS 3.8  --     ABG: No results for input(s): PHART, PCO2ART, PO2ART, HCO3, O2SAT in the last 168 hours.  Liver Function Tests: No results for input(s): AST, ALT, ALKPHOS, BILITOT, PROT, ALBUMIN in the last 168 hours. No results for input(s): LIPASE, AMYLASE in the last 168 hours. No results for input(s): AMMONIA in the last 168 hours.  CBC: Recent Labs  Lab 04/14/20 0730 04/18/20 0505  WBC 6.5 5.6  HGB 11.3* 9.8*  HCT 37.1* 33.2*  MCV 89.8 89.5  PLT 284 284    Cardiac Enzymes: No results for input(s): CKTOTAL, CKMB, CKMBINDEX, TROPONINI in the last 168 hours.  BNP (last 3 results) Recent Labs    02/10/20 0355  BNP 675.6*     ProBNP (last 3 results) No results for input(s): PROBNP in the last 8760 hours.  Radiological Exams: No results found.  Assessment/Plan Active Problems:   Acute on chronic respiratory failure with hypoxia (HCC)   Cardiac arrest (HCC)   COPD, severe (HCC)   Coronary artery disease involving native coronary artery of native heart   Atrial fibrillation, rapid (HCC)   1. Acute on chronic respiratory failure with hypoxia continue with T collar trials titrate oxygen as tolerated continue pulmonary toilet. 2. Cardiac arrest rhythm is stable at this time 3. Severe COPD treated resolved 4. Coronary artery disease at baseline we will continue to monitor. 5. Atrial fibrillation rate is controlled at this time   I have personally seen and evaluated the patient, evaluated laboratory and imaging results, formulated the assessment and plan and placed orders. The Patient requires high complexity decision making with multiple systems involvement.  Rounds were done with the Respiratory Therapy Director and Staff therapists and discussed with nursing staff also.  Yevonne Pax, MD [facility] Pulmonary Critical Care Medicine Sleep Medicine

## 2020-04-19 NOTE — Progress Notes (Addendum)
Pulmonary Critical Care Medicine [facility] [place]   PULMONARY CRITICAL CARE SERVICE  PROGRESS NOTE  Date of Service: 04/19/2020  Tony Munoz  NIO:[id number]  DOB: 1956-05-01   DOA: 03/04/2020  Referring Physician: Carron Curie, MD  HPI: Tony Munoz is a 64 y.o. male seen for follow up of Acute on Chronic Respiratory Failure.  Patient continues on 28% T-bar satting well no fever or distress.  Medications: Reviewed on Rounds  Physical Exam:  Vitals: Pulse 86 respirations 25 BP 107/88 O2 sat 98% temp 97.4  Ventilator Settings 28% ATC  . General: Comfortable at this time . Eyes: Grossly normal lids, irises & conjunctiva . ENT: grossly tongue is normal . Neck: no obvious mass . Cardiovascular: S1 S2 normal no gallop . Respiratory: No rales or rhonchi noted . Abdomen: soft . Skin: no rash seen on limited exam . Musculoskeletal: not rigid . Psychiatric:unable to assess . Neurologic: no seizure no involuntary movements         Lab Data:   Basic Metabolic Panel: Recent Labs  Lab 04/14/20 0730 04/18/20 0505  NA 137 142  K 4.5 3.6  CL 95* 97*  CO2 29 35*  GLUCOSE 112* 119*  BUN 64* 61*  CREATININE 0.93 0.88  CALCIUM 9.2 9.1  MG 2.3 2.4  PHOS 3.8  --     ABG: No results for input(s): PHART, PCO2ART, PO2ART, HCO3, O2SAT in the last 168 hours.  Liver Function Tests: No results for input(s): AST, ALT, ALKPHOS, BILITOT, PROT, ALBUMIN in the last 168 hours. No results for input(s): LIPASE, AMYLASE in the last 168 hours. No results for input(s): AMMONIA in the last 168 hours.  CBC: Recent Labs  Lab 04/14/20 0730 04/18/20 0505  WBC 6.5 5.6  HGB 11.3* 9.8*  HCT 37.1* 33.2*  MCV 89.8 89.5  PLT 284 284    Cardiac Enzymes: No results for input(s): CKTOTAL, CKMB, CKMBINDEX, TROPONINI in the last 168 hours.  BNP (last 3 results) Recent Labs    02/10/20 0355  BNP 675.6*    ProBNP (last 3 results) No results for input(s): PROBNP in the  last 8760 hours.  Radiological Exams: No results found.  Assessment/Plan Active Problems:   Acute on chronic respiratory failure with hypoxia (HCC)   Cardiac arrest (HCC)   COPD, severe (HCC)   Coronary artery disease involving native coronary artery of native heart   Atrial fibrillation, rapid (HCC)   1. Acute on chronic respiratory failure with hypoxia we'll continue with T-bar 28% FiO2 continue supportive measures and pulmonary toilet. 2. Cardiac arrest rhythm is stable at this time 3. Severe COPD treated resolved 4. Coronary artery disease at baseline we will continue to monitor. 5. Atrial fibrillation rate is controlled at this time   I have personally seen and evaluated the patient, evaluated laboratory and imaging results, formulated the assessment and plan and placed orders. The Patient requires high complexity decision making with multiple systems involvement.  Rounds were done with the Respiratory Therapy Director and Staff therapists and discussed with nursing staff also.  Yevonne Pax, MD [facility] Pulmonary Critical Care Medicine Sleep Medicine

## 2020-04-20 LAB — BASIC METABOLIC PANEL
Anion gap: 8 (ref 5–15)
BUN: 59 mg/dL — ABNORMAL HIGH (ref 8–23)
CO2: 35 mmol/L — ABNORMAL HIGH (ref 22–32)
Calcium: 9.3 mg/dL (ref 8.9–10.3)
Chloride: 99 mmol/L (ref 98–111)
Creatinine, Ser: 0.83 mg/dL (ref 0.61–1.24)
GFR calc Af Amer: >60 mL/min (ref >60)
GFR calc non Af Amer: >60 mL/min (ref >60)
Glucose, Bld: 180 mg/dL — ABNORMAL HIGH (ref 70–99)
Potassium: 3.7 mmol/L (ref 3.5–5.1)
Sodium: 142 mmol/L (ref 135–145)

## 2020-04-20 LAB — CBC
HCT: 33.7 % — ABNORMAL LOW (ref 39.0–52.0)
Hemoglobin: 9.7 g/dL — ABNORMAL LOW (ref 13.0–17.0)
MCH: 26 pg (ref 26.0–34.0)
MCHC: 28.8 g/dL — ABNORMAL LOW (ref 30.0–36.0)
MCV: 90.3 fL (ref 80.0–100.0)
Platelets: 269 10*3/uL (ref 150–400)
RBC: 3.73 MIL/uL — ABNORMAL LOW (ref 4.22–5.81)
RDW: 16.2 % — ABNORMAL HIGH (ref 11.5–15.5)
WBC: 5.7 10*3/uL (ref 4.0–10.5)
nRBC: 0 % (ref 0.0–0.2)

## 2020-04-20 LAB — CULTURE, RESPIRATORY W GRAM STAIN

## 2020-04-20 LAB — MAGNESIUM: Magnesium: 2.3 mg/dL (ref 1.7–2.4)

## 2020-04-20 LAB — PHOSPHORUS: Phosphorus: 4 mg/dL (ref 2.5–4.6)

## 2020-04-20 NOTE — Progress Notes (Signed)
Pulmonary Critical Care Medicine [facility] [place]   PULMONARY CRITICAL CARE SERVICE  PROGRESS NOTE  Date of Service: 04/20/2020  Tony Munoz  VFI:[id number]  DOB: 06-29-1956   DOA: 03/04/2020  Referring Physician: Carron Curie, MD  HPI: Tony Munoz is a 64 y.o. male seen for follow up of Acute on Chronic Respiratory Failure.  Patient continues to have thick copious secretions noted.  Right now is on T collar  Medications: Reviewed on Rounds  Physical Exam:  Vitals: Temperature is 96.8 pulse 81 respiratory 25 blood pressure is 129/78 saturations 99%  Ventilator Settings on T collar FiO2 28% copious secretions  . General: Comfortable at this time . Eyes: Grossly normal lids, irises & conjunctiva . ENT: grossly tongue is normal . Neck: no obvious mass . Cardiovascular: S1 S2 normal no gallop . Respiratory: No rhonchi very coarse breath sounds . Abdomen: soft . Skin: no rash seen on limited exam . Musculoskeletal: not rigid . Psychiatric:unable to assess . Neurologic: no seizure no involuntary movements         Lab Data:   Basic Metabolic Panel: Recent Labs  Lab 04/14/20 0730 04/18/20 0505 04/20/20 0605  NA 137 142 142  K 4.5 3.6 3.7  CL 95* 97* 99  CO2 29 35* 35*  GLUCOSE 112* 119* 180*  BUN 64* 61* 59*  CREATININE 0.93 0.88 0.83  CALCIUM 9.2 9.1 9.3  MG 2.3 2.4 2.3  PHOS 3.8  --  4.0    ABG: No results for input(s): PHART, PCO2ART, PO2ART, HCO3, O2SAT in the last 168 hours.  Liver Function Tests: No results for input(s): AST, ALT, ALKPHOS, BILITOT, PROT, ALBUMIN in the last 168 hours. No results for input(s): LIPASE, AMYLASE in the last 168 hours. No results for input(s): AMMONIA in the last 168 hours.  CBC: Recent Labs  Lab 04/14/20 0730 04/18/20 0505 04/20/20 0605  WBC 6.5 5.6 5.7  HGB 11.3* 9.8* 9.7*  HCT 37.1* 33.2* 33.7*  MCV 89.8 89.5 90.3  PLT 284 284 269    Cardiac Enzymes: No results for input(s): CKTOTAL, CKMB,  CKMBINDEX, TROPONINI in the last 168 hours.  BNP (last 3 results) Recent Labs    02/10/20 0355  BNP 675.6*    ProBNP (last 3 results) No results for input(s): PROBNP in the last 8760 hours.  Radiological Exams: No results found.  Assessment/Plan Active Problems:   Acute on chronic respiratory failure with hypoxia (HCC)   Cardiac arrest (HCC)   COPD, severe (HCC)   Coronary artery disease involving native coronary artery of native heart   Atrial fibrillation, rapid (HCC)   1. Acute on chronic respiratory failure hypoxia continue with T collar trial secretions are limiting [place] as far as being able to advance weaning. 2. Cardiac arrest rhythm has been stable we will continue to monitor closely 3. Severe COPD at baseline continue present management 4. Coronary artery disease supportive care no active changes noted 5. Atrial fibrillation rate is controlled   I have personally seen and evaluated the patient, evaluated laboratory and imaging results, formulated the assessment and plan and placed orders. The Patient requires high complexity decision making with multiple systems involvement.  Rounds were done with the Respiratory Therapy Director and Staff therapists and discussed with nursing staff also.  Yevonne Pax, MD [facility] Pulmonary Critical Care Medicine Sleep Medicine

## 2020-04-21 NOTE — Progress Notes (Signed)
Pulmonary Critical Care Medicine [facility] [place]   PULMONARY CRITICAL CARE SERVICE  PROGRESS NOTE  Date of Service: 04/21/2020  Tony Munoz  [id number]:[id number]  DOB: [birth year]   DOA: 03/04/2020  Referring Physician: Carron Curie, MD  HPI: Tony Munoz is a 64 y.o. male seen for follow up of Acute on Chronic Respiratory Failure.  Patient remains on T collar on 28% FiO2 remains with copious secretions requiring frequent suctioning.  Medications: Reviewed on Rounds  Physical Exam:  Vitals: Temperature is 98.0 pulse 85 respiratory 23 blood pressure is 120/66 saturations 98%  Ventilator Settings on T collar FiO2 28%  . General: Comfortable at this time . Eyes: Grossly normal lids, irises & conjunctiva . ENT: grossly tongue is normal . Neck: no obvious mass . Cardiovascular: S1 S2 normal no gallop . Respiratory: No rhonchi no rales noted at this time . Abdomen: soft . Skin: no rash seen on limited exam . Musculoskeletal: not rigid . Psychiatric:unable to assess . Neurologic: no seizure no involuntary movements         Lab Data:   Basic Metabolic Panel: Recent Labs  Lab 04/18/20 0505 04/20/20 0605  NA 142 142  K 3.6 3.7  CL 97* 99  CO2 35* 35*  GLUCOSE 119* 180*  BUN 61* 59*  CREATININE 0.88 0.83  CALCIUM 9.1 9.3  MG 2.4 2.3  PHOS  --  4.0    ABG: No results for input(s): PHART, PCO2ART, PO2ART, HCO3, O2SAT in the last 168 hours.  Liver Function Tests: No results for input(s): AST, ALT, ALKPHOS, BILITOT, PROT, ALBUMIN in the last 168 hours. No results for input(s): LIPASE, AMYLASE in the last 168 hours. No results for input(s): AMMONIA in the last 168 hours.  CBC: Recent Labs  Lab 04/18/20 0505 04/20/20 0605  WBC 5.6 5.7  HGB 9.8* 9.7*  HCT 33.2* 33.7*  MCV 89.5 90.3  PLT 284 269    Cardiac Enzymes: No results for input(s): CKTOTAL, CKMB, CKMBINDEX, TROPONINI in the last 168 hours.  BNP (last 3 results) Recent Labs     02/10/20 0355  BNP 675.6*    ProBNP (last 3 results) No results for input(s): PROBNP in the last 8760 hours.  Radiological Exams: No results found.  Assessment/Plan Active Problems:   Acute on chronic respiratory failure with hypoxia (HCC)   Cardiac arrest (HCC)   COPD, severe (HCC)   Coronary artery disease involving native coronary artery of native heart   Atrial fibrillation, rapid (HCC)   1. Acute on chronic respiratory failure hypoxia continue T collar trials titrate oxygen as tolerated patient is not able to do capping because of copious secretions. 2. Cardiac arrest rhythm is stable we will continue to monitor 3. Severe COPD medical management 4. Coronary artery disease at baseline 5. Atrial fibrillation rate controlled we will continue to monitor   I have personally seen and evaluated the patient, evaluated laboratory and imaging results, formulated the assessment and plan and placed orders. The Patient requires high complexity decision making with multiple systems involvement.  Rounds were done with the Respiratory Therapy Director and Staff therapists and discussed with nursing staff also.  Yevonne Pax, MD [facility] Pulmonary Critical Care Medicine Sleep Medicine

## 2020-04-22 NOTE — Progress Notes (Signed)
Pulmonary Critical Care Medicine [facility] [place]   PULMONARY CRITICAL CARE SERVICE  PROGRESS NOTE  Date of Service: 04/22/2020  Tony Munoz  YTK:[id number]  DOB: May 26, 1956   DOA: 03/04/2020  Referring Physician: Carron Curie, MD  HPI: Tony Munoz is a 64 y.o. male seen for follow up of Acute on Chronic Respiratory Failure.  Patient at this time is on T collar has been on 20% FiO2 doing fairly well at this stage  Medications: Reviewed on Rounds  Physical Exam:  Vitals: Temperature 97.2 pulse 90 respiratory rate 20 blood pressure is 139/76 saturations 94%  Ventilator Settings on T collar FiO2 28%  . General: Comfortable at this time . Eyes: Grossly normal lids, irises & conjunctiva . ENT: grossly tongue is normal . Neck: no obvious mass . Cardiovascular: S1 S2 normal no gallop . Respiratory: No rhonchi coarse breath sounds noted . Abdomen: soft . Skin: no rash seen on limited exam . Musculoskeletal: not rigid . Psychiatric:unable to assess . Neurologic: no seizure no involuntary movements         Lab Data:   Basic Metabolic Panel: Recent Labs  Lab 04/18/20 0505 04/20/20 0605  NA 142 142  K 3.6 3.7  CL 97* 99  CO2 35* 35*  GLUCOSE 119* 180*  BUN 61* 59*  CREATININE 0.88 0.83  CALCIUM 9.1 9.3  MG 2.4 2.3  PHOS  --  4.0    ABG: No results for input(s): PHART, PCO2ART, PO2ART, HCO3, O2SAT in the last 168 hours.  Liver Function Tests: No results for input(s): AST, ALT, ALKPHOS, BILITOT, PROT, ALBUMIN in the last 168 hours. No results for input(s): LIPASE, AMYLASE in the last 168 hours. No results for input(s): AMMONIA in the last 168 hours.  CBC: Recent Labs  Lab 04/18/20 0505 04/20/20 0605  WBC 5.6 5.7  HGB 9.8* 9.7*  HCT 33.2* 33.7*  MCV 89.5 90.3  PLT 284 269    Cardiac Enzymes: No results for input(s): CKTOTAL, CKMB, CKMBINDEX, TROPONINI in the last 168 hours.  BNP (last 3 results) Recent Labs    02/10/20 0355  BNP  675.6*    ProBNP (last 3 results) No results for input(s): PROBNP in the last 8760 hours.  Radiological Exams: No results found.  Assessment/Plan Active Problems:   Acute on chronic respiratory failure with hypoxia (HCC)   Cardiac arrest (HCC)   COPD, severe (HCC)   Coronary artery disease involving native coronary artery of native heart   Atrial fibrillation, rapid (HCC)   1. Acute on chronic respiratory failure with hypoxia we will continue with T collar trials trach will be changed out to a cuffless trach 2. Cardiac arrest rhythm has been stable 3. Severe COPD no change continue present management 4. Coronary artery disease stable 5. Atrial fibrillation rate is controlled   I have personally seen and evaluated the patient, evaluated laboratory and imaging results, formulated the assessment and plan and placed orders. The Patient requires high complexity decision making with multiple systems involvement.  Rounds were done with the Respiratory Therapy Director and Staff therapists and discussed with nursing staff also.  Yevonne Pax, MD [facility] Pulmonary Critical Care Medicine Sleep Medicine

## 2020-04-23 NOTE — Progress Notes (Addendum)
Pulmonary Critical Care Medicine [facility] [place]   PULMONARY CRITICAL CARE SERVICE  PROGRESS NOTE  Date of Service: 04/23/2020  Tony Munoz  [id number]:[id number]  DOB: [birth year]   DOA: 03/04/2020  Referring Physician: Carron Curie, MD  HPI: Tony Munoz is a 64 y.o. male seen for follow up of Acute on Chronic Respiratory Failure.  Patient remains on 28% aerosol trach collar satting well this time trach was changed yesterday to #6 cuffless doing well.  Medications: Reviewed on Rounds  Physical Exam:  Vitals: Pulse 91 respirations 30 BP 139/75 O2 sat 94% temp 90 point  Ventilator Settings 28% ATC  . General: Comfortable at this time . Eyes: Grossly normal lids, irises & conjunctiva . ENT: grossly tongue is normal . Neck: no obvious mass . Cardiovascular: S1 S2 normal no gallop . Respiratory: No rales or rhonchi noted . Abdomen: soft . Skin: no rash seen on limited exam . Musculoskeletal: not rigid . Psychiatric:unable to assess . Neurologic: no seizure no involuntary movements         Lab Data:   Basic Metabolic Panel: Recent Labs  Lab 04/18/20 0505 04/20/20 0605  NA 142 142  K 3.6 3.7  CL 97* 99  CO2 35* 35*  GLUCOSE 119* 180*  BUN 61* 59*  CREATININE 0.88 0.83  CALCIUM 9.1 9.3  MG 2.4 2.3  PHOS  --  4.0    ABG: No results for input(s): PHART, PCO2ART, PO2ART, HCO3, O2SAT in the last 168 hours.  Liver Function Tests: No results for input(s): AST, ALT, ALKPHOS, BILITOT, PROT, ALBUMIN in the last 168 hours. No results for input(s): LIPASE, AMYLASE in the last 168 hours. No results for input(s): AMMONIA in the last 168 hours.  CBC: Recent Labs  Lab 04/18/20 0505 04/20/20 0605  WBC 5.6 5.7  HGB 9.8* 9.7*  HCT 33.2* 33.7*  MCV 89.5 90.3  PLT 284 269    Cardiac Enzymes: No results for input(s): CKTOTAL, CKMB, CKMBINDEX, TROPONINI in the last 168 hours.  BNP (last 3 results) Recent Labs    02/10/20 0355  BNP 675.6*    ProBNP  (last 3 results) No results for input(s): PROBNP in the last 8760 hours.  Radiological Exams: No results found.  Assessment/Plan Active Problems:   Acute on chronic respiratory failure with hypoxia (HCC)   Cardiac arrest (HCC)   COPD, severe (HCC)   Coronary artery disease involving native coronary artery of native heart   Atrial fibrillation, rapid (HCC)   1. Acute on chronic respiratory failure with hypoxia we will continue with T collar trials trach will be changed out to a cuffless trach 2. Cardiac arrest rhythm has been stable 3. Severe COPD no change continue present management 4. Coronary artery disease stable 5. Atrial fibrillation rate is controlled   I have personally seen and evaluated the patient, evaluated laboratory and imaging results, formulated the assessment and plan and placed orders. The Patient requires high complexity decision making with multiple systems involvement.  Rounds were done with the Respiratory Therapy Director and Staff therapists and discussed with nursing staff also.  Yevonne Pax, MD [facility] Pulmonary Critical Care Medicine Sleep Medicine

## 2020-04-24 LAB — BASIC METABOLIC PANEL
Anion gap: 13 (ref 5–15)
BUN: 67 mg/dL — ABNORMAL HIGH (ref 8–23)
CO2: 28 mmol/L (ref 22–32)
Calcium: 9.6 mg/dL (ref 8.9–10.3)
Chloride: 109 mmol/L (ref 98–111)
Creatinine, Ser: 1.02 mg/dL (ref 0.61–1.24)
GFR calc Af Amer: >60 mL/min (ref >60)
GFR calc non Af Amer: >60 mL/min (ref >60)
Glucose, Bld: 116 mg/dL — ABNORMAL HIGH (ref 70–99)
Potassium: 3.5 mmol/L (ref 3.5–5.1)
Sodium: 150 mmol/L — ABNORMAL HIGH (ref 135–145)

## 2020-04-24 LAB — CBC
HCT: 37.1 % — ABNORMAL LOW (ref 39.0–52.0)
Hemoglobin: 10.9 g/dL — ABNORMAL LOW (ref 13.0–17.0)
MCH: 26 pg (ref 26.0–34.0)
MCHC: 29.4 g/dL — ABNORMAL LOW (ref 30.0–36.0)
MCV: 88.3 fL (ref 80.0–100.0)
Platelets: 313 10*3/uL (ref 150–400)
RBC: 4.2 MIL/uL — ABNORMAL LOW (ref 4.22–5.81)
RDW: 16.7 % — ABNORMAL HIGH (ref 11.5–15.5)
WBC: 11.6 10*3/uL — ABNORMAL HIGH (ref 4.0–10.5)
nRBC: 0 % (ref 0.0–0.2)

## 2020-04-24 LAB — MAGNESIUM: Magnesium: 2.3 mg/dL (ref 1.7–2.4)

## 2020-04-24 LAB — PHOSPHORUS: Phosphorus: 2.3 mg/dL — ABNORMAL LOW (ref 2.5–4.6)

## 2020-04-24 NOTE — Progress Notes (Addendum)
Pulmonary Critical Care Medicine [facility] [place]   PULMONARY CRITICAL CARE SERVICE  PROGRESS NOTE  Date of Service: 04/24/2020  Tony Munoz  CVE:[id number]  DOB: 08-18-1956   DOA: 03/04/2020  Referring Physician: Carron Curie, MD  HPI: Tony Munoz is a 64 y.o. male seen for follow up of Acute on Chronic Respiratory Failure.  Patient continues on 28% aerosol trach collar currently doing well no distress.  Medications: Reviewed on Rounds  Physical Exam:  Vitals: Pulse 106 respirations 36 BP 150/65 O2 sat 93% temp 98.2  Ventilator Settings 28% ATC  . General: Comfortable at this time . Eyes: Grossly normal lids, irises & conjunctiva . ENT: grossly tongue is normal . Neck: no obvious mass . Cardiovascular: S1 S2 normal no gallop . Respiratory: No rales or rhonchi noted . Abdomen: soft . Skin: no rash seen on limited exam . Musculoskeletal: not rigid . Psychiatric:unable to assess . Neurologic: no seizure no involuntary movements         Lab Data:   Basic Metabolic Panel: Recent Labs  Lab 04/18/20 0505 04/20/20 0605 04/24/20 0937  NA 142 142 150*  K 3.6 3.7 3.5  CL 97* 99 109  CO2 35* 35* 28  GLUCOSE 119* 180* 116*  BUN 61* 59* 67*  CREATININE 0.88 0.83 1.02  CALCIUM 9.1 9.3 9.6  MG 2.4 2.3 2.3  PHOS  --  4.0 2.3*    ABG: No results for input(s): PHART, PCO2ART, PO2ART, HCO3, O2SAT in the last 168 hours.  Liver Function Tests: No results for input(s): AST, ALT, ALKPHOS, BILITOT, PROT, ALBUMIN in the last 168 hours. No results for input(s): LIPASE, AMYLASE in the last 168 hours. No results for input(s): AMMONIA in the last 168 hours.  CBC: Recent Labs  Lab 04/18/20 0505 04/20/20 0605 04/24/20 0937  WBC 5.6 5.7 11.6*  HGB 9.8* 9.7* 10.9*  HCT 33.2* 33.7* 37.1*  MCV 89.5 90.3 88.3  PLT 284 269 313    Cardiac Enzymes: No results for input(s): CKTOTAL, CKMB, CKMBINDEX, TROPONINI in the last 168 hours.  BNP (last 3  results) Recent Labs    02/10/20 0355  BNP 675.6*    ProBNP (last 3 results) No results for input(s): PROBNP in the last 8760 hours.  Radiological Exams: No results found.  Assessment/Plan Active Problems:   Acute on chronic respiratory failure with hypoxia (HCC)   Cardiac arrest (HCC)   COPD, severe (HCC)   Coronary artery disease involving native coronary artery of native heart   Atrial fibrillation, rapid (HCC)   1. Acute on chronic respiratory failure with hypoxia we will continue with T collar trials trach will be changed out to a cuffless trach 2. Cardiac arrest rhythm has been stable 3. Severe COPD no change continue present management 4. Coronary artery disease stable 5. Atrial fibrillation rate is controlled    I have personally seen and evaluated the patient, evaluated laboratory and imaging results, formulated the assessment and plan and placed orders. The Patient requires high complexity decision making with multiple systems involvement.  Rounds were done with the Respiratory Therapy Director and Staff therapists and discussed with nursing staff also.  Yevonne Pax, MD [facility] Pulmonary Critical Care Medicine Sleep Medicine

## 2020-04-25 ENCOUNTER — Other Ambulatory Visit (HOSPITAL_COMMUNITY): Payer: Medicare HMO

## 2020-04-25 LAB — BASIC METABOLIC PANEL
Anion gap: 10 (ref 5–15)
BUN: 71 mg/dL — ABNORMAL HIGH (ref 8–23)
CO2: 32 mmol/L (ref 22–32)
Calcium: 9.2 mg/dL (ref 8.9–10.3)
Chloride: 105 mmol/L (ref 98–111)
Creatinine, Ser: 1.04 mg/dL (ref 0.61–1.24)
GFR calc Af Amer: >60 mL/min (ref >60)
GFR calc non Af Amer: >60 mL/min (ref >60)
Glucose, Bld: 84 mg/dL (ref 70–99)
Potassium: 3.5 mmol/L (ref 3.5–5.1)
Sodium: 147 mmol/L — ABNORMAL HIGH (ref 135–145)

## 2020-04-25 LAB — PHOSPHORUS: Phosphorus: 3.2 mg/dL (ref 2.5–4.6)

## 2020-04-25 LAB — MAGNESIUM: Magnesium: 2.3 mg/dL (ref 1.7–2.4)

## 2020-04-25 NOTE — Progress Notes (Signed)
PROGRESS NOTE    Tony Munoz  JSE:[id number] DOB: [birth year] DOA: 03/04/2020   Brief Narrative:  Tony Munoz is an 64 y.o. male with medical history significant for hypertension, carotid artery stenosis, nicotine abuse, multivessel coronary artery disease who was admitted to the outside hospital for carotid endarterectomy and coronary bypass surgery.  He was found to have significant coronary artery disease.  He underwent CABG x4 utilizing LIMA to LAD, RIMA to posterior descending coronary artery and SVG to the first and second diagonal branches of the LAD.  He also underwent harvesting of greater saphenous from right lower leg.  Patient tolerated the procedure.  However, his stay was complicated with rapid atrial fibrillation and worsening respiratory distress.  He was initiated on amiodarone.  Pulmonary, critical care team was consulted and the patient had to be intubated.  On postop day 3 patient apparently self extubated therefore had to be reintubated.  He is status post bronchoscopy with removal of aspirated blood.  Patient also developed alcohol withdrawal.  Due to inability to wean from the vent he underwent tracheostomy on 02/15/2020.  He was treated with multiple antibiotics for suspected aspiration pneumonia.  Patient apparently coded on postoperative day 14 and had return of spontaneous circulation after CPR and IV epinephrine.  He continued to require significant ventilator support therefore was transferred to [facility] for further management on 03/04/2020.  After admission here he had respiratory cultures collected on 03/15/2020 which showed a few Enterobacter cloacae, few Enterococcus faecalis.  He had subsequent urine cultures that were collected on 03/15/2020 which showed 50,000 colonies per mL of Enterobacter cloacae.  Patient again had respiratory cultures on 03/21/2020 due to increasing secretions which came back as normal respiratory flora.  He received treatment with  ciprofloxacin and Flagyl which completed on 03/28/2020.  He continued to have increasing secretions.  CT of the chest without contrast on 03/30/2020 per report persistent moderate to large bilateral pleural effusions with near complete collapse of the left upper lobe, new extensive consolidation within the left upper lobe concerning for pneumonia or aspiration. He had repeat cultures collected on 03/31/2020 which are showing few Pseudomonas aeruginosa. He had ultrasound-guided thoracentesis on 04/01/20 on the left side with removal of 250 mL of fluid.  He was treated with IV vancomycin, meropenem.  Repeat respiratory cultures from 04/17/2020 showed Pseudomonas.  Now resistant to Carbapenem's but susceptible to ciprofloxacin.  Assessment & Plan: Active Problems: Acute on chronic respiratory failure with hypoxia Pneumonia with Pseudomonas, Carbapenem resistant Pleural effusion UTI with Enterobacter Cardiac arrest COPD, severe  Acute encephalopathy Dysphagia History of alcohol abuse Coronary artery disease involving native coronary artery of native heart Atrial fibrillation  Acute on chronic respiratory failure with hypoxia: Multifactorial etiology.  He had CT done on 03/30/2020 which per report moderate to large bilateral pleural effusions with near complete collapse of the left upper lobe.  He is status post drainage of the pleural effusions.  He also had pneumonia and underwent treatment with multiple rounds of antibiotics.  Initially with Cipro, Flagyl, subsequently with IV vancomycin, meropenem.  More recent respiratory cultures showing Pseudomonas again but resistant to the Carbapenem but susceptible to ciprofloxacin, high MIC to the other antibiotics.  Currently started on ciprofloxacin.  Plan to treat for tentative duration of 1 week pending improvement.  He says he is still having some secretions but unable to cough it up. He also has dysphagia and concern for aspiration therefore high risk for  recurrent and worsening pneumonia  secondary to aspiration despite being on antibiotics.  Pulmonary following.  Pneumonia: Patient has had respiratory cultures which showed Pseudomonas for which he was already treated with meropenem, also treated with IV vancomycin because of concern for aspiration pneumonia.  However, more recent respiratory cultures again showing Pseudomonas this time resistant to the Carbapenem but susceptible to ciprofloxacin.  Antibiotics and plan as mentioned above. Again, as mentioned above due to his dysphagia and concern for aspiration he is at risk for recurrent and worsening pneumonia.  Please monitor BUN/creatinine closely while on antibiotics.  Pleural effusion: He is status post drainage. On antibiotics as mentioned above.   UTI: He had previous UTI with urine culture that showed Enterobacter.  He is status post treatment with Cipro for the UTI.  Now restarted on ciprofloxacin for respiratory cultures that are showing Pseudomonas.  He has a Foley catheter and therefore high risk for recurrent UTI.  If he starts having any fever, worsening leukocytosis suggest he also send UA with urine cultures.  Sacrococcygeal pressure ulcer: He has unstageable sacrococcygeal pressure ulcer.  Continue local wound care.  Antibiotics as mentioned above.  If the wound starts worsening consider obtaining imaging to evaluate for possible osteomyelitis.  COPD: Patient reportedly has severe bullous emphysema.  Pulmonary following.  Continue medication and management per primary team.    Multivessel coronary artery disease with significant carotid artery disease: He is status post CABG x4+ left carotid endarterectomy.  Continue medications per the primary team.  Plavix on hold due to PEG placement today.  Acute encephalopathy: He has history of alcohol abuse.  On Celexa, Klonopin and low-dose Seroquel.  Also on thiamine, folate, multivitamin.  Continue supportive management per primary  team.  Dysphagia: Unfortunately due to his dysphagia he is at risk for aspiration and worsening respiratory failure secondary to aspiration pneumonia.    Atrial fibrillation: Continue rate control medications per the primary team.  Unfortunately due to his complex medical problems he is at risk for worsening and decompensation.    Subjective: He is complaining of increased secretions from the trach but unable to cough it up.  More awake.  ATC 28%.  Respiratory culture showing Pseudomonas.  Objective: Vitals: Temperature 100.8, pulse 115, respiratory rate 26, blood pressure 108/83, pulse oximetry 92%.  Examination: General: Ill-appearing male, awake Head: Atraumatic, normocephalic Eyes: PERLA ENMT: external ears and nose appear normal, normal hearing, Lips appears normal, moist oral mucosa Neck: Has trach in place CVS: S1-S2 Respiratory: Decreased breath sound lower lobes, rhonchi, occasional wheezing Abdomen: soft nontender, nondistended, normal bowel sounds  Musculoskeletal: no edema Neuro: Awake,has debility with generalized weakness otherwise grossly nonfocal Psych: stable Skin: Sacrococcygeal pressure ulcer unstageable, no new rashes    Data Reviewed: I have personally reviewed following labs and imaging studies  CBC: Recent Labs  Lab 04/20/20 0605 04/24/20 0937  WBC 5.7 11.6*  HGB 9.7* 10.9*  HCT 33.7* 37.1*  MCV 90.3 88.3  PLT 269 313    Basic Metabolic Panel: Recent Labs  Lab 04/20/20 0605 04/24/20 0937 04/25/20 0708  NA 142 150* 147*  K 3.7 3.5 3.5  CL 99 109 105  CO2 35* 28 32  GLUCOSE 180* 116* 84  BUN 59* 67* 71*  CREATININE 0.83 1.02 1.04  CALCIUM 9.3 9.6 9.2  MG 2.3 2.3 2.3  PHOS 4.0 2.3* 3.2    GFR: CrCl cannot be calculated (Unknown ideal weight.).  Liver Function Tests: No results for input(s): AST, ALT, ALKPHOS, BILITOT, PROT, ALBUMIN in the last 168  hours.  CBG: No results for input(s): GLUCAP in the last 168 hours.   Recent  Results (from the past 240 hour(s))  Culture, respiratory (non-expectorated)     Status: None   Collection Time: 04/17/20  9:11 AM   Specimen: Tracheal Aspirate; Respiratory  Result Value Ref Range Status   Specimen Description TRACHEAL ASPIRATE  Final   Special Requests NONE  Final   Gram Stain   Final    FEW WBC PRESENT, PREDOMINANTLY PMN ABUNDANT GRAM POSITIVE COCCI RARE GRAM NEGATIVE RODS    Culture   Final    MODERATE PSEUDOMONAS AERUGINOSA MODERATE GROUP B STREP(S.AGALACTIAE)ISOLATED TESTING AGAINST S. AGALACTIAE NOT ROUTINELY PERFORMED DUE TO PREDICTABILITY OF AMP/PEN/VAN SUSCEPTIBILITY. Performed at [facility], [address] [place], [place] [address]    Report Status 04/20/2020 FINAL  Final   Organism ID, Bacteria PSEUDOMONAS AERUGINOSA  Final      Susceptibility   Pseudomonas aeruginosa - MIC*    CEFTAZIDIME 4 SENSITIVE Sensitive     CIPROFLOXACIN 0.5 SENSITIVE Sensitive     GENTAMICIN <=1 SENSITIVE Sensitive     IMIPENEM >=16 RESISTANT Resistant     PIP/TAZO 8 SENSITIVE Sensitive     CEFEPIME 2 SENSITIVE Sensitive     * MODERATE PSEUDOMONAS AERUGINOSA     Radiology Studies: DG CHEST PORT 1 VIEW  Result Date: 04/25/2020 CLINICAL DATA:  Pneumonia.  Tracheostomy. EXAM: PORTABLE CHEST 1 VIEW COMPARISON:  04/11/2020.  04/01/2020. FINDINGS: Tracheostomy noted in stable position. Prior CABG. Heart size stable. COPD. Persistent unchanged bilateral pulmonary infiltrates, left side greater than right again noted. Small left pleural effusion again noted. No pneumothorax. IMPRESSION: 1.  Tracheostomy tube stable position. 2.  Prior CABG.  Heart size stable. 2. COPD. Persistent unchanged bilateral pulmonary infiltrates, left side greater than right again noted. Small left pleural effusion again noted. Electronically Signed   By: Maisie Fus  Register   On: 04/25/2020 06:13    Scheduled Meds: Please see MAR   Vonzella Nipple, MD  04/25/2020, 4:35 PM

## 2020-04-25 NOTE — Progress Notes (Addendum)
Pulmonary Critical Care Medicine [facility] [place]   PULMONARY CRITICAL CARE SERVICE  PROGRESS NOTE  Date of Service: 04/25/2020  Tony Munoz  IRW:[id number]  DOB: 08/15/1956   DOA: 03/04/2020  Referring Physician: Carron Curie, MD  HPI: Tony Munoz is a 64 y.o. male seen for follow up of Acute on Chronic Respiratory Failure.  Patient remains on 28% aerosol trach collar satting well no fever distress patient does have moderate amount of secretions noted.  Medications: Reviewed on Rounds  Physical Exam:  Vitals: Pulse 91 respirations 26 BP 130/68 O2 sat 96% temp 97.4  Ventilator Settings 28% aerosol trach collar  . General: Comfortable at this time . Eyes: Grossly normal lids, irises & conjunctiva . ENT: grossly tongue is normal . Neck: no obvious mass . Cardiovascular: S1 S2 normal no gallop . Respiratory: No rales or rhonchi noted . Abdomen: soft . Skin: no rash seen on limited exam . Musculoskeletal: not rigid . Psychiatric:unable to assess . Neurologic: no seizure no involuntary movements         Lab Data:   Basic Metabolic Panel: Recent Labs  Lab 04/20/20 0605 04/24/20 0937 04/25/20 0708  NA 142 150* 147*  K 3.7 3.5 3.5  CL 99 109 105  CO2 35* 28 32  GLUCOSE 180* 116* 84  BUN 59* 67* 71*  CREATININE 0.83 1.02 1.04  CALCIUM 9.3 9.6 9.2  MG 2.3 2.3 2.3  PHOS 4.0 2.3* 3.2    ABG: No results for input(s): PHART, PCO2ART, PO2ART, HCO3, O2SAT in the last 168 hours.  Liver Function Tests: No results for input(s): AST, ALT, ALKPHOS, BILITOT, PROT, ALBUMIN in the last 168 hours. No results for input(s): LIPASE, AMYLASE in the last 168 hours. No results for input(s): AMMONIA in the last 168 hours.  CBC: Recent Labs  Lab 04/20/20 0605 04/24/20 0937  WBC 5.7 11.6*  HGB 9.7* 10.9*  HCT 33.7* 37.1*  MCV 90.3 88.3  PLT 269 313    Cardiac Enzymes: No results for input(s): CKTOTAL, CKMB, CKMBINDEX, TROPONINI in the last 168  hours.  BNP (last 3 results) Recent Labs    02/10/20 0355  BNP 675.6*    ProBNP (last 3 results) No results for input(s): PROBNP in the last 8760 hours.  Radiological Exams: DG CHEST PORT 1 VIEW  Result Date: 04/25/2020 CLINICAL DATA:  Pneumonia.  Tracheostomy. EXAM: PORTABLE CHEST 1 VIEW COMPARISON:  04/11/2020.  04/01/2020. FINDINGS: Tracheostomy noted in stable position. Prior CABG. Heart size stable. COPD. Persistent unchanged bilateral pulmonary infiltrates, left side greater than right again noted. Small left pleural effusion again noted. No pneumothorax. IMPRESSION: 1.  Tracheostomy tube stable position. 2.  Prior CABG.  Heart size stable. 2. COPD. Persistent unchanged bilateral pulmonary infiltrates, left side greater than right again noted. Small left pleural effusion again noted. Electronically Signed   By: Maisie Fus  Register   On: 04/25/2020 06:13    Assessment/Plan Active Problems:   Acute on chronic respiratory failure with hypoxia (HCC)   Cardiac arrest (HCC)   COPD, severe (HCC)   Coronary artery disease involving native coronary artery of native heart   Atrial fibrillation, rapid (HCC)   1. Acute on chronic respiratory failure with hypoxia we will continue T collar trials patient is currently satting well continue supportive measures and aggressive pulmonary toilet. 2. Cardiac arrest rhythm has been stable 3. Severe COPD no change continue present management 4. Coronary artery disease stable 5. Atrial fibrillation rate is controlled   I have  personally seen and evaluated the patient, evaluated laboratory and imaging results, formulated the assessment and plan and placed orders. The Patient requires high complexity decision making with multiple systems involvement.  Rounds were done with the Respiratory Therapy Director and Staff therapists and discussed with nursing staff also.  Allyne Gee, MD [facility] Pulmonary Critical Care Medicine Sleep Medicine

## 2020-04-26 LAB — BASIC METABOLIC PANEL
Anion gap: 7 (ref 5–15)
BUN: 60 mg/dL — ABNORMAL HIGH (ref 8–23)
CO2: 27 mmol/L (ref 22–32)
Calcium: 8.4 mg/dL — ABNORMAL LOW (ref 8.9–10.3)
Chloride: 103 mmol/L (ref 98–111)
Creatinine, Ser: 0.98 mg/dL (ref 0.61–1.24)
GFR calc Af Amer: >60 mL/min (ref >60)
GFR calc non Af Amer: >60 mL/min (ref >60)
Glucose, Bld: 120 mg/dL — ABNORMAL HIGH (ref 70–99)
Potassium: 3.4 mmol/L — ABNORMAL LOW (ref 3.5–5.1)
Sodium: 137 mmol/L (ref 135–145)

## 2020-04-26 LAB — MAGNESIUM: Magnesium: 2.2 mg/dL (ref 1.7–2.4)

## 2020-04-26 LAB — CBC
HCT: 30.8 % — ABNORMAL LOW (ref 39.0–52.0)
Hemoglobin: 9.1 g/dL — ABNORMAL LOW (ref 13.0–17.0)
MCH: 26.3 pg (ref 26.0–34.0)
MCHC: 29.5 g/dL — ABNORMAL LOW (ref 30.0–36.0)
MCV: 89 fL (ref 80.0–100.0)
Platelets: 213 10*3/uL (ref 150–400)
RBC: 3.46 MIL/uL — ABNORMAL LOW (ref 4.22–5.81)
RDW: 16.4 % — ABNORMAL HIGH (ref 11.5–15.5)
WBC: 7.8 10*3/uL (ref 4.0–10.5)
nRBC: 0 % (ref 0.0–0.2)

## 2020-04-26 NOTE — Progress Notes (Addendum)
Pulmonary Critical Care Medicine [facility] [place]   PULMONARY CRITICAL CARE SERVICE  PROGRESS NOTE  Date of Service: 04/26/2020  Tony Munoz  HYI:[id number]  DOB: December 09, 1955   DOA: 03/04/2020  Referring Physician: Carron Curie, MD  HPI: Tony Munoz is a 64 y.o. male seen for follow up of Acute on Chronic Respiratory Failure. Patient remains on 28% T-bar satting well at this time with no fever distress.  Medications: Reviewed on Rounds  Physical Exam:  Vitals: Pulse 88 respirations 24 BP 98/87 O2 sat 97% temp 96.4  Ventilator Settings 28% ATC  . General: Comfortable at this time . Eyes: Grossly normal lids, irises & conjunctiva . ENT: grossly tongue is normal . Neck: no obvious mass . Cardiovascular: S1 S2 normal no gallop . Respiratory: No rales or rhonchi noted . Abdomen: soft . Skin: no rash seen on limited exam . Musculoskeletal: not rigid . Psychiatric:unable to assess . Neurologic: no seizure no involuntary movements         Lab Data:   Basic Metabolic Panel: Recent Labs  Lab 04/20/20 0605 04/24/20 0937 04/25/20 0708 04/26/20 0553  NA 142 150* 147* 137  K 3.7 3.5 3.5 3.4*  CL 99 109 105 103  CO2 35* 28 32 27  GLUCOSE 180* 116* 84 120*  BUN 59* 67* 71* 60*  CREATININE 0.83 1.02 1.04 0.98  CALCIUM 9.3 9.6 9.2 8.4*  MG 2.3 2.3 2.3 2.2  PHOS 4.0 2.3* 3.2  --     ABG: No results for input(s): PHART, PCO2ART, PO2ART, HCO3, O2SAT in the last 168 hours.  Liver Function Tests: No results for input(s): AST, ALT, ALKPHOS, BILITOT, PROT, ALBUMIN in the last 168 hours. No results for input(s): LIPASE, AMYLASE in the last 168 hours. No results for input(s): AMMONIA in the last 168 hours.  CBC: Recent Labs  Lab 04/20/20 0605 04/24/20 0937 04/26/20 0553  WBC 5.7 11.6* 7.8  HGB 9.7* 10.9* 9.1*  HCT 33.7* 37.1* 30.8*  MCV 90.3 88.3 89.0  PLT 269 313 213    Cardiac Enzymes: No results for input(s): CKTOTAL, CKMB, CKMBINDEX,  TROPONINI in the last 168 hours.  BNP (last 3 results) Recent Labs    02/10/20 0355  BNP 675.6*    ProBNP (last 3 results) No results for input(s): PROBNP in the last 8760 hours.  Radiological Exams: DG CHEST PORT 1 VIEW  Result Date: 04/25/2020 CLINICAL DATA:  Pneumonia.  Tracheostomy. EXAM: PORTABLE CHEST 1 VIEW COMPARISON:  04/11/2020.  04/01/2020. FINDINGS: Tracheostomy noted in stable position. Prior CABG. Heart size stable. COPD. Persistent unchanged bilateral pulmonary infiltrates, left side greater than right again noted. Small left pleural effusion again noted. No pneumothorax. IMPRESSION: 1.  Tracheostomy tube stable position. 2.  Prior CABG.  Heart size stable. 2. COPD. Persistent unchanged bilateral pulmonary infiltrates, left side greater than right again noted. Small left pleural effusion again noted. Electronically Signed   By: Maisie Fus  Register   On: 04/25/2020 06:13    Assessment/Plan Active Problems:   Acute on chronic respiratory failure with hypoxia (HCC)   Cardiac arrest (HCC)   COPD, severe (HCC)   Coronary artery disease involving native coronary artery of native heart   Atrial fibrillation, rapid (HCC)   1. Acute on chronic respiratory failure with hypoxia we will continue T collar trials patient is currently satting well continue supportive measures and aggressive pulmonary toilet. 2. Cardiac arrest rhythm has been stable 3. Severe COPD no change continue present management 4. Coronary artery  disease stable 5. Atrial fibrillation rate is controlled   I have personally seen and evaluated the patient, evaluated laboratory and imaging results, formulated the assessment and plan and placed orders. The Patient requires high complexity decision making with multiple systems involvement.  Rounds were done with the Respiratory Therapy Director and Staff therapists and discussed with nursing staff also.  Yevonne Pax, MD [facility] Pulmonary Critical Care  Medicine Sleep Medicine

## 2020-04-27 LAB — POTASSIUM: Potassium: 4.2 mmol/L (ref 3.5–5.1)

## 2020-04-27 NOTE — Progress Notes (Addendum)
Pulmonary Critical Care Medicine [facility] [place]   PULMONARY CRITICAL CARE SERVICE  PROGRESS NOTE  Date of Service: 04/27/2020  Tony Munoz  NFA:[id number]  DOB: 03-03-1956   DOA: 03/04/2020  Referring Physician: Carron Curie, MD  HPI: Tony Munoz is a 64 y.o. male seen for follow up of Acute on Chronic Respiratory Failure.  Patient remains on 28% T-bar at this time satting well no fever or distress  Medications: Reviewed on Rounds  Physical Exam:  Vitals: Pulse 89 respirations 26 BP 110/64 O2 sat 94% temp 99.6  Ventilator Settings 28% T-bar  . General: Comfortable at this time . Eyes: Grossly normal lids, irises & conjunctiva . ENT: grossly tongue is normal . Neck: no obvious mass . Cardiovascular: S1 S2 normal no gallop . Respiratory: No rales or rhonchi noted . Abdomen: soft . Skin: no rash seen on limited exam . Musculoskeletal: not rigid . Psychiatric:unable to assess . Neurologic: no seizure no involuntary movements         Lab Data:   Basic Metabolic Panel: Recent Labs  Lab 04/24/20 0937 04/25/20 0708 04/26/20 0553 04/27/20 0555  NA 150* 147* 137  --   K 3.5 3.5 3.4* 4.2  CL 109 105 103  --   CO2 28 32 27  --   GLUCOSE 116* 84 120*  --   BUN 67* 71* 60*  --   CREATININE 1.02 1.04 0.98  --   CALCIUM 9.6 9.2 8.4*  --   MG 2.3 2.3 2.2  --   PHOS 2.3* 3.2  --   --     ABG: No results for input(s): PHART, PCO2ART, PO2ART, HCO3, O2SAT in the last 168 hours.  Liver Function Tests: No results for input(s): AST, ALT, ALKPHOS, BILITOT, PROT, ALBUMIN in the last 168 hours. No results for input(s): LIPASE, AMYLASE in the last 168 hours. No results for input(s): AMMONIA in the last 168 hours.  CBC: Recent Labs  Lab 04/24/20 0937 04/26/20 0553  WBC 11.6* 7.8  HGB 10.9* 9.1*  HCT 37.1* 30.8*  MCV 88.3 89.0  PLT 313 213    Cardiac Enzymes: No results for input(s): CKTOTAL, CKMB, CKMBINDEX, TROPONINI in the last 168  hours.  BNP (last 3 results) Recent Labs    02/10/20 0355  BNP 675.6*    ProBNP (last 3 results) No results for input(s): PROBNP in the last 8760 hours.  Radiological Exams: No results found.  Assessment/Plan Active Problems:   Acute on chronic respiratory failure with hypoxia (HCC)   Cardiac arrest (HCC)   COPD, severe (HCC)   Coronary artery disease involving native coronary artery of native heart   Atrial fibrillation, rapid (HCC)   1. Acute on chronic respiratory failure with hypoxiapatient will continue on 20% T-bar at this time continue to wean as tolerated.  Continue supportive measures and aggressive pulmonary toilet. 2. Cardiac arrest rhythm has been stable 3. Severe COPD no change continue present management 4. Coronary artery disease stable 5. Atrial fibrillation rate is controlled   I have personally seen and evaluated the patient, evaluated laboratory and imaging results, formulated the assessment and plan and placed orders. The Patient requires high complexity decision making with multiple systems involvement.  Rounds were done with the Respiratory Therapy Director and Staff therapists and discussed with nursing staff also.  Yevonne Pax, MD [facility] Pulmonary Critical Care Medicine Sleep Medicine

## 2020-04-28 NOTE — Progress Notes (Addendum)
Pulmonary Critical Care Medicine [facility] [place]   PULMONARY CRITICAL CARE SERVICE  PROGRESS NOTE  Date of Service: 04/28/2020  Tony Munoz  HCW:[id number]  DOB: 11-26-1955   DOA: 03/04/2020  Referring Physician: Carron Curie, MD  HPI: Tony Munoz is a 64 y.o. male seen for follow up of Acute on Chronic Respiratory Failure.  Patient continues on 28% T-bar no fever or distress noted at this time.  Medications: Reviewed on Rounds  Physical Exam:  Vitals: Pulse 86 respirations 44 BP 110/62 O2 sat 95% temp 97.6  Ventilator Settings 28% T-bar  . General: Comfortable at this time . Eyes: Grossly normal lids, irises & conjunctiva . ENT: grossly tongue is normal . Neck: no obvious mass . Cardiovascular: S1 S2 normal no gallop . Respiratory: No rales or rhonchi noted . Abdomen: soft . Skin: no rash seen on limited exam . Musculoskeletal: not rigid . Psychiatric:unable to assess . Neurologic: no seizure no involuntary movements         Lab Data:   Basic Metabolic Panel: Recent Labs  Lab 04/24/20 0937 04/25/20 0708 04/26/20 0553 04/27/20 0555  NA 150* 147* 137  --   K 3.5 3.5 3.4* 4.2  CL 109 105 103  --   CO2 28 32 27  --   GLUCOSE 116* 84 120*  --   BUN 67* 71* 60*  --   CREATININE 1.02 1.04 0.98  --   CALCIUM 9.6 9.2 8.4*  --   MG 2.3 2.3 2.2  --   PHOS 2.3* 3.2  --   --     ABG: No results for input(s): PHART, PCO2ART, PO2ART, HCO3, O2SAT in the last 168 hours.  Liver Function Tests: No results for input(s): AST, ALT, ALKPHOS, BILITOT, PROT, ALBUMIN in the last 168 hours. No results for input(s): LIPASE, AMYLASE in the last 168 hours. No results for input(s): AMMONIA in the last 168 hours.  CBC: Recent Labs  Lab 04/24/20 0937 04/26/20 0553  WBC 11.6* 7.8  HGB 10.9* 9.1*  HCT 37.1* 30.8*  MCV 88.3 89.0  PLT 313 213    Cardiac Enzymes: No results for input(s): CKTOTAL, CKMB, CKMBINDEX, TROPONINI in the last 168 hours.  BNP  (last 3 results) Recent Labs    02/10/20 0355  BNP 675.6*    ProBNP (last 3 results) No results for input(s): PROBNP in the last 8760 hours.  Radiological Exams: No results found.  Assessment/Plan Active Problems:   Acute on chronic respiratory failure with hypoxia (HCC)   Cardiac arrest (HCC)   COPD, severe (HCC)   Coronary artery disease involving native coronary artery of native heart   Atrial fibrillation, rapid (HCC)   1. Acute on chronic respiratory failure with hypoxiapatient will continue on 28% T-bar at this time continue to wean as tolerated.  Continue supportive measures and aggressive pulmonary toilet. 2. Cardiac arrest rhythm has been stable 3. Severe COPD no change continue present management 4. Coronary artery disease stable 5. Atrial fibrillation rate is controlled   I have personally seen and evaluated the patient, evaluated laboratory and imaging results, formulated the assessment and plan and placed orders. The Patient requires high complexity decision making with multiple systems involvement.  Rounds were done with the Respiratory Therapy Director and Staff therapists and discussed with nursing staff also.  Yevonne Pax, MD [facility] Pulmonary Critical Care Medicine Sleep Medicine

## 2020-04-29 LAB — BASIC METABOLIC PANEL
Anion gap: 10 (ref 5–15)
BUN: 49 mg/dL — ABNORMAL HIGH (ref 8–23)
CO2: 29 mmol/L (ref 22–32)
Calcium: 9 mg/dL (ref 8.9–10.3)
Chloride: 102 mmol/L (ref 98–111)
Creatinine, Ser: 0.83 mg/dL (ref 0.61–1.24)
GFR calc Af Amer: >60 mL/min (ref >60)
GFR calc non Af Amer: >60 mL/min (ref >60)
Glucose, Bld: 115 mg/dL — ABNORMAL HIGH (ref 70–99)
Potassium: 4 mmol/L (ref 3.5–5.1)
Sodium: 141 mmol/L (ref 135–145)

## 2020-04-29 LAB — CBC
HCT: 32.1 % — ABNORMAL LOW (ref 39.0–52.0)
Hemoglobin: 9.6 g/dL — ABNORMAL LOW (ref 13.0–17.0)
MCH: 26.2 pg (ref 26.0–34.0)
MCHC: 29.9 g/dL — ABNORMAL LOW (ref 30.0–36.0)
MCV: 87.7 fL (ref 80.0–100.0)
Platelets: 284 10*3/uL (ref 150–400)
RBC: 3.66 MIL/uL — ABNORMAL LOW (ref 4.22–5.81)
RDW: 16.1 % — ABNORMAL HIGH (ref 11.5–15.5)
WBC: 11.7 10*3/uL — ABNORMAL HIGH (ref 4.0–10.5)
nRBC: 0 % (ref 0.0–0.2)

## 2020-04-29 LAB — MAGNESIUM: Magnesium: 2.1 mg/dL (ref 1.7–2.4)

## 2020-04-29 LAB — PHOSPHORUS: Phosphorus: 2.4 mg/dL — ABNORMAL LOW (ref 2.5–4.6)

## 2020-04-29 NOTE — Progress Notes (Signed)
Pulmonary Critical Care Medicine [facility] [place]   PULMONARY CRITICAL CARE SERVICE  PROGRESS NOTE  Date of Service: 04/29/2020  Tony Munoz  [id number]:[id number]  DOB: Mar 04, 1956   DOA: 03/04/2020  Referring Physician: Carron Curie, MD  HPI: Tony Munoz is a 64 y.o. male seen for follow up of Acute on Chronic Respiratory Failure.  Patient is on T collar has been on 35% FiO2 good saturations are noted.  Medications: Reviewed on Rounds  Physical Exam:  Vitals: Temperature was up to 101.0 pulse 102 respiratory rate is 34 blood pressure is 127/76 saturations 93%  Ventilator Settings off the ventilator on T collar FiO2 35%  . General: Comfortable at this time . Eyes: Grossly normal lids, irises & conjunctiva . ENT: grossly tongue is normal . Neck: no obvious mass . Cardiovascular: S1 S2 normal no gallop . Respiratory: No rhonchi no rales are noted at this time . Abdomen: soft . Skin: no rash seen on limited exam . Musculoskeletal: not rigid . Psychiatric:unable to assess . Neurologic: no seizure no involuntary movements         Lab Data:   Basic Metabolic Panel: Recent Labs  Lab 04/24/20 0937 04/25/20 0708 04/26/20 0553 04/27/20 0555  NA 150* 147* 137  --   K 3.5 3.5 3.4* 4.2  CL 109 105 103  --   CO2 28 32 27  --   GLUCOSE 116* 84 120*  --   BUN 67* 71* 60*  --   CREATININE 1.02 1.04 0.98  --   CALCIUM 9.6 9.2 8.4*  --   MG 2.3 2.3 2.2  --   PHOS 2.3* 3.2  --   --     ABG: No results for input(s): PHART, PCO2ART, PO2ART, HCO3, O2SAT in the last 168 hours.  Liver Function Tests: No results for input(s): AST, ALT, ALKPHOS, BILITOT, PROT, ALBUMIN in the last 168 hours. No results for input(s): LIPASE, AMYLASE in the last 168 hours. No results for input(s): AMMONIA in the last 168 hours.  CBC: Recent Labs  Lab 04/24/20 0937 04/26/20 0553  WBC 11.6* 7.8  HGB 10.9* 9.1*  HCT 37.1* 30.8*  MCV 88.3 89.0  PLT 313 213    Cardiac  Enzymes: No results for input(s): CKTOTAL, CKMB, CKMBINDEX, TROPONINI in the last 168 hours.  BNP (last 3 results) Recent Labs    02/10/20 0355  BNP 675.6*    ProBNP (last 3 results) No results for input(s): PROBNP in the last 8760 hours.  Radiological Exams: No results found.  Assessment/Plan Active Problems:   Acute on chronic respiratory failure with hypoxia (HCC)   Cardiac arrest (HCC)   COPD, severe (HCC)   Coronary artery disease involving native coronary artery of native heart   Atrial fibrillation, rapid (HCC)   1. Acute on chronic respiratory failure with hypoxia plan is to continue with T collar trials currently on 35% FiO2 2. Cardiac arrest rhythm has been stable 3. Severe COPD at baseline 4. Coronary artery disease at baseline we will continue to monitor 5. Atrial fibrillation rate is controlled we will continue to follow along   I have personally seen and evaluated the patient, evaluated laboratory and imaging results, formulated the assessment and plan and placed orders. The Patient requires high complexity decision making with multiple systems involvement.  Rounds were done with the Respiratory Therapy Director and Staff therapists and discussed with nursing staff also.  Yevonne Pax, MD [facility] Pulmonary Critical Care Medicine Sleep Medicine

## 2020-04-30 NOTE — Progress Notes (Addendum)
Pulmonary Critical Care Medicine [facility] [place]   PULMONARY CRITICAL CARE SERVICE  PROGRESS NOTE  Date of Service: 04/30/2020  Tony Munoz  [id number]:[id number]  DOB: [birth year]   DOA: 03/04/2020  Referring Physician: Carron Curie, MD  HPI: Tony Munoz is a 64 y.o. male seen for follow up of Acute on Chronic Respiratory Failure.  Patient remains on 35% aerosol trach collar with thick secretions noted satting well no distress.  Medications: Reviewed on Rounds  Physical Exam:  Vitals: Pulse 87 respirations 26 BP 120/71 O2 sat 95% temp 99.2  Ventilator Settings 35% ATC  . General: Comfortable at this time . Eyes: Grossly normal lids, irises & conjunctiva . ENT: grossly tongue is normal . Neck: no obvious mass . Cardiovascular: S1 S2 normal no gallop . Respiratory: No rales or rhonchi noted . Abdomen: soft . Skin: no rash seen on limited exam . Musculoskeletal: not rigid . Psychiatric:unable to assess . Neurologic: no seizure no involuntary movements         Lab Data:   Basic Metabolic Panel: Recent Labs  Lab 04/24/20 0937 04/25/20 0708 04/26/20 0553 04/27/20 0555 04/29/20 0857  NA 150* 147* 137  --  141  K 3.5 3.5 3.4* 4.2 4.0  CL 109 105 103  --  102  CO2 28 32 27  --  29  GLUCOSE 116* 84 120*  --  115*  BUN 67* 71* 60*  --  49*  CREATININE 1.02 1.04 0.98  --  0.83  CALCIUM 9.6 9.2 8.4*  --  9.0  MG 2.3 2.3 2.2  --  2.1  PHOS 2.3* 3.2  --   --  2.4*    ABG: No results for input(s): PHART, PCO2ART, PO2ART, HCO3, O2SAT in the last 168 hours.  Liver Function Tests: No results for input(s): AST, ALT, ALKPHOS, BILITOT, PROT, ALBUMIN in the last 168 hours. No results for input(s): LIPASE, AMYLASE in the last 168 hours. No results for input(s): AMMONIA in the last 168 hours.  CBC: Recent Labs  Lab 04/24/20 0937 04/26/20 0553 04/29/20 0857  WBC 11.6* 7.8 11.7*  HGB 10.9* 9.1* 9.6*  HCT 37.1* 30.8* 32.1*  MCV 88.3 89.0 87.7  PLT 313 213  284    Cardiac Enzymes: No results for input(s): CKTOTAL, CKMB, CKMBINDEX, TROPONINI in the last 168 hours.  BNP (last 3 results) Recent Labs    02/10/20 0355  BNP 675.6*    ProBNP (last 3 results) No results for input(s): PROBNP in the last 8760 hours.  Radiological Exams: No results found.  Assessment/Plan Active Problems:   Acute on chronic respiratory failure with hypoxia (HCC)   Cardiac arrest (HCC)   COPD, severe (HCC)   Coronary artery disease involving native coronary artery of native heart   Atrial fibrillation, rapid (HCC)   1. Acute on chronic respiratory failure with hypoxia plan is to continue with T collar trials currently on 35% FiO2 2. Cardiac arrest rhythm has been stable 3. Severe COPD at baseline 4. Coronary artery disease at baseline we will continue to monitor 5. Atrial fibrillation rate is controlled we will continue to follow along   I have personally seen and evaluated the patient, evaluated laboratory and imaging results, formulated the assessment and plan and placed orders. The Patient requires high complexity decision making with multiple systems involvement.  Rounds were done with the Respiratory Therapy Director and Staff therapists and discussed with nursing staff also.  Yevonne Pax, MD [facility] Pulmonary Critical Care  Medicine Sleep Medicine

## 2020-05-01 NOTE — Progress Notes (Addendum)
Pulmonary Critical Care Medicine [facility] [place]   PULMONARY CRITICAL CARE SERVICE  PROGRESS NOTE  Date of Service: 05/01/2020  Tony Munoz  [id number]  DOB: Apr 19, 1956   DOA: 03/04/2020  Referring Physician: Carron Curie, MD  HPI: Tony Munoz is a 64 y.o. male seen for follow up of Acute on Chronic Respiratory Failure.  Patient remains on 40% aerosol trach collar today satting well no distress.  Medications: Reviewed on Rounds  Physical Exam:  Vitals: Pulse 95 respirations 28 BP 136/75 O2 sat 97% temp 97.4  Ventilator Settings ATC 40%  . General: Comfortable at this time . Eyes: Grossly normal lids, irises & conjunctiva . ENT: grossly tongue is normal . Neck: no obvious mass . Cardiovascular: S1 S2 normal no gallop . Respiratory: No rales or rhonchi noted . Abdomen: soft . Skin: no rash seen on limited exam . Musculoskeletal: not rigid . Psychiatric:unable to assess . Neurologic: no seizure no involuntary movements         Lab Data:   Basic Metabolic Panel: Recent Labs  Lab 04/25/20 0708 04/26/20 0553 04/27/20 0555 04/29/20 0857  NA 147* 137  --  141  K 3.5 3.4* 4.2 4.0  CL 105 103  --  102  CO2 32 27  --  29  GLUCOSE 84 120*  --  115*  BUN 71* 60*  --  49*  CREATININE 1.04 0.98  --  0.83  CALCIUM 9.2 8.4*  --  9.0  MG 2.3 2.2  --  2.1  PHOS 3.2  --   --  2.4*    ABG: No results for input(s): PHART, PCO2ART, PO2ART, HCO3, O2SAT in the last 168 hours.  Liver Function Tests: No results for input(s): AST, ALT, ALKPHOS, BILITOT, PROT, ALBUMIN in the last 168 hours. No results for input(s): LIPASE, AMYLASE in the last 168 hours. No results for input(s): AMMONIA in the last 168 hours.  CBC: Recent Labs  Lab 04/26/20 0553 04/29/20 0857  WBC 7.8 11.7*  HGB 9.1* 9.6*  HCT 30.8* 32.1*  MCV 89.0 87.7  PLT 213 284    Cardiac Enzymes: No results for input(s): CKTOTAL, CKMB, CKMBINDEX, TROPONINI in the last 168 hours.  BNP  (last 3 results) Recent Labs    02/10/20 0355  BNP 675.6*    ProBNP (last 3 results) No results for input(s): PROBNP in the last 8760 hours.  Radiological Exams: No results found.  Assessment/Plan Active Problems:   Acute on chronic respiratory failure with hypoxia (HCC)   Cardiac arrest (HCC)   COPD, severe (HCC)   Coronary artery disease involving native coronary artery of native heart   Atrial fibrillation, rapid (HCC)   1. Acute on chronic respiratory failure with hypoxia patient will continue on aerosol trach collar has a moderate to large amount of thick tan secretions will continue secretion management aggressive pulmonary toilet.  Continue on trach collar 40% at this time.  We will continue to attempt weaning as tolerated. 2. Cardiac arrest rhythm has been stable 3. Severe COPD at baseline 4. Coronary artery disease at baseline we will continue to monitor 5. Atrial fibrillation rate is controlled we will continue to follow along   I have personally seen and evaluated the patient, evaluated laboratory and imaging results, formulated the assessment and plan and placed orders. The Patient requires high complexity decision making with multiple systems involvement.  Rounds were done with the Respiratory Therapy Director and Staff therapists and discussed with nursing staff also.  Allyne Gee, MD [facility] Pulmonary Critical Care Medicine Sleep Medicine

## 2020-05-02 LAB — CBC
HCT: 29.6 % — ABNORMAL LOW (ref 39.0–52.0)
Hemoglobin: 8.7 g/dL — ABNORMAL LOW (ref 13.0–17.0)
MCH: 25.5 pg — ABNORMAL LOW (ref 26.0–34.0)
MCHC: 29.4 g/dL — ABNORMAL LOW (ref 30.0–36.0)
MCV: 86.8 fL (ref 80.0–100.0)
Platelets: 365 10*3/uL (ref 150–400)
RBC: 3.41 MIL/uL — ABNORMAL LOW (ref 4.22–5.81)
RDW: 15.9 % — ABNORMAL HIGH (ref 11.5–15.5)
WBC: 5 10*3/uL (ref 4.0–10.5)
nRBC: 0 % (ref 0.0–0.2)

## 2020-05-02 LAB — MAGNESIUM: Magnesium: 2.1 mg/dL (ref 1.7–2.4)

## 2020-05-02 LAB — BASIC METABOLIC PANEL
Anion gap: 9 (ref 5–15)
BUN: 35 mg/dL — ABNORMAL HIGH (ref 8–23)
CO2: 24 mmol/L (ref 22–32)
Calcium: 8.5 mg/dL — ABNORMAL LOW (ref 8.9–10.3)
Chloride: 103 mmol/L (ref 98–111)
Creatinine, Ser: 0.63 mg/dL (ref 0.61–1.24)
GFR calc Af Amer: >60 mL/min (ref >60)
GFR calc non Af Amer: >60 mL/min (ref >60)
Glucose, Bld: 126 mg/dL — ABNORMAL HIGH (ref 70–99)
Potassium: 4.6 mmol/L (ref 3.5–5.1)
Sodium: 136 mmol/L (ref 135–145)

## 2020-05-02 NOTE — Progress Notes (Signed)
Pulmonary Critical Care Medicine [facility] [place]   PULMONARY CRITICAL CARE SERVICE  PROGRESS NOTE  Date of Service: 05/02/2020  Tony Munoz  [id number]  DOB: Nov 17, 1955   DOA: 03/04/2020  Referring Physician: Carron Curie, MD  HPI: Tony Munoz is a 64 y.o. male seen for follow up of Acute on Chronic Respiratory Failure.  Patient continues to have a great deal of difficulty with copious secretions.  Right now on T collar FiO2 was decreased down to 35%  Medications: Reviewed on Rounds  Physical Exam:  Vitals: Temperature is 98.1 pulse 91 respiratory rate 30 blood pressure is 122/70 saturations 98%  Ventilator Settings on T collar FiO2 of 35%  . General: Comfortable at this time . Eyes: Grossly normal lids, irises & conjunctiva . ENT: grossly tongue is normal . Neck: no obvious mass . Cardiovascular: S1 S2 normal no gallop . Respiratory: No rhonchi no rales are noted at this time . Abdomen: soft . Skin: no rash seen on limited exam . Musculoskeletal: not rigid . Psychiatric:unable to assess . Neurologic: no seizure no involuntary movements         Lab Data:   Basic Metabolic Panel: Recent Labs  Lab 04/26/20 0553 04/27/20 0555 04/29/20 0857 05/02/20 0644  NA 137  --  141 136  K 3.4* 4.2 4.0 4.6  CL 103  --  102 103  CO2 27  --  29 24  GLUCOSE 120*  --  115* 126*  BUN 60*  --  49* 35*  CREATININE 0.98  --  0.83 0.63  CALCIUM 8.4*  --  9.0 8.5*  MG 2.2  --  2.1 2.1  PHOS  --   --  2.4*  --     ABG: No results for input(s): PHART, PCO2ART, PO2ART, HCO3, O2SAT in the last 168 hours.  Liver Function Tests: No results for input(s): AST, ALT, ALKPHOS, BILITOT, PROT, ALBUMIN in the last 168 hours. No results for input(s): LIPASE, AMYLASE in the last 168 hours. No results for input(s): AMMONIA in the last 168 hours.  CBC: Recent Labs  Lab 04/26/20 0553 04/29/20 0857 05/02/20 0644  WBC 7.8 11.7* 5.0  HGB 9.1* 9.6* 8.7*  HCT 30.8*  32.1* 29.6*  MCV 89.0 87.7 86.8  PLT 213 284 365    Cardiac Enzymes: No results for input(s): CKTOTAL, CKMB, CKMBINDEX, TROPONINI in the last 168 hours.  BNP (last 3 results) Recent Labs    02/10/20 0355  BNP 675.6*    ProBNP (last 3 results) No results for input(s): PROBNP in the last 8760 hours.  Radiological Exams: No results found.  Assessment/Plan Active Problems:   Acute on chronic respiratory failure with hypoxia (HCC)   Cardiac arrest (HCC)   COPD, severe (HCC)   Coronary artery disease involving native coronary artery of native heart   Atrial fibrillation, rapid (HCC)   1. Acute on chronic respiratory failure hypoxia plan is to continue with T collar trials titrate oxygen continue pulmonary toilet. 2. Cardiac arrest rhythm stable 3. Severe COPD medical management nebulizers as necessary 4. Coronary artery disease stable 5. Atrial fibrillation rate controlled   I have personally seen and evaluated the patient, evaluated laboratory and imaging results, formulated the assessment and plan and placed orders. The Patient requires high complexity decision making with multiple systems involvement.  Rounds were done with the Respiratory Therapy Director and Staff therapists and discussed with nursing staff also.  Tony Pax, MD [facility] Pulmonary Critical Care Medicine Sleep  Medicine

## 2020-05-03 ENCOUNTER — Other Ambulatory Visit (HOSPITAL_COMMUNITY): Payer: Medicare HMO

## 2020-05-03 NOTE — Progress Notes (Addendum)
Pulmonary Critical Care Medicine [facility] [place]   PULMONARY CRITICAL CARE SERVICE  PROGRESS NOTE  Date of Service: 05/03/2020  Tony Munoz  [id number]  DOB: Feb 22, 1956   DOA: 03/04/2020  Referring Physician: Carron Curie, MD  HPI: Tony Munoz is a 64 y.o. male seen for follow up of Acute on Chronic Respiratory Failure.  Patient remains on 35% aerosol trach collar has some yellowish secretions and a sputum culture was sent today satting well no distress.  Medications: Reviewed on Rounds  Physical Exam:  Vitals: Pulse 89 respirations 25 BP 107/65 O2 sat 96% temp 97.2  Ventilator Settings 35% ATC  . General: Comfortable at this time . Eyes: Grossly normal lids, irises & conjunctiva . ENT: grossly tongue is normal . Neck: no obvious mass . Cardiovascular: S1 S2 normal no gallop . Respiratory: No rales or rhonchi noted . Abdomen: soft . Skin: no rash seen on limited exam . Musculoskeletal: not rigid . Psychiatric:unable to assess . Neurologic: no seizure no involuntary movements         Lab Data:   Basic Metabolic Panel: Recent Labs  Lab 04/27/20 0555 04/29/20 0857 05/02/20 0644  NA  --  141 136  K 4.2 4.0 4.6  CL  --  102 103  CO2  --  29 24  GLUCOSE  --  115* 126*  BUN  --  49* 35*  CREATININE  --  0.83 0.63  CALCIUM  --  9.0 8.5*  MG  --  2.1 2.1  PHOS  --  2.4*  --     ABG: No results for input(s): PHART, PCO2ART, PO2ART, HCO3, O2SAT in the last 168 hours.  Liver Function Tests: No results for input(s): AST, ALT, ALKPHOS, BILITOT, PROT, ALBUMIN in the last 168 hours. No results for input(s): LIPASE, AMYLASE in the last 168 hours. No results for input(s): AMMONIA in the last 168 hours.  CBC: Recent Labs  Lab 04/29/20 0857 05/02/20 0644  WBC 11.7* 5.0  HGB 9.6* 8.7*  HCT 32.1* 29.6*  MCV 87.7 86.8  PLT 284 365    Cardiac Enzymes: No results for input(s): CKTOTAL, CKMB, CKMBINDEX, TROPONINI in the last 168  hours.  BNP (last 3 results) Recent Labs    02/10/20 0355  BNP 675.6*    ProBNP (last 3 results) No results for input(s): PROBNP in the last 8760 hours.  Radiological Exams: DG CHEST PORT 1 VIEW  Result Date: 05/03/2020 CLINICAL DATA:  Tracheostomy with increased secretions EXAM: PORTABLE CHEST 1 VIEW COMPARISON:  04/25/2020 FINDINGS: Left costophrenic angle is excluded. Tracheostomy device is present. Left greater than right opacities are similar in appearance. Background changes of COPD. Bullous changes are present at the lung apices. There is volume loss in the left hemithorax. Stable cardiomediastinal contours. IMPRESSION: Similar appearance of left greater than right pulmonary opacities superimposed on advanced COPD. Electronically Signed   By: Guadlupe Spanish M.D.   On: 05/03/2020 10:52    Assessment/Plan Active Problems:   Acute on chronic respiratory failure with hypoxia (HCC)   Cardiac arrest (HCC)   COPD, severe (HCC)   Coronary artery disease involving native coronary artery of native heart   Atrial fibrillation, rapid (HCC)   1. Acute on chronic respiratory failure hypoxia plan is to continue with T collar trials titrate oxygen continue pulmonary toilet. 2. Cardiac arrest rhythm stable 3. Severe COPD medical management nebulizers as necessary 4. Coronary artery disease stable 5. Atrial fibrillation rate controlled   I have  personally seen and evaluated the patient, evaluated laboratory and imaging results, formulated the assessment and plan and placed orders. The Patient requires high complexity decision making with multiple systems involvement.  Rounds were done with the Respiratory Therapy Director and Staff therapists and discussed with nursing staff also.  Allyne Gee, MD [facility] Pulmonary Critical Care Medicine Sleep Medicine

## 2020-05-05 NOTE — Progress Notes (Signed)
Pulmonary Critical Care Medicine [facility] [place]   PULMONARY CRITICAL CARE SERVICE  PROGRESS NOTE  Date of Service: 05/05/2020  Tony Munoz  [id number]  DOB: Aug 05, 1956   DOA: 03/04/2020  Referring Physician: Carron Curie, MD  HPI: Tony Munoz is a 64 y.o. male seen for follow up of Acute on Chronic Respiratory Failure.  Patient is on T collar this morning has been on 35% FiO2 still with copious amounts of secretions.  Patient has been cultured and has been on antibiotics  Medications: Reviewed on Rounds  Physical Exam:  Vitals: Temperature is 97.4 pulse 98 respiratory rate 30 blood pressure is 121/65 saturations 98%  Ventilator Settings off the ventilator on T collar FiO2 35%  . General: Comfortable at this time . Eyes: Grossly normal lids, irises & conjunctiva . ENT: grossly tongue is normal . Neck: no obvious mass . Cardiovascular: S1 S2 normal no gallop . Respiratory: No rhonchi very coarse breath sounds . Abdomen: soft . Skin: no rash seen on limited exam . Musculoskeletal: not rigid . Psychiatric:unable to assess . Neurologic: no seizure no involuntary movements         Lab Data:   Basic Metabolic Panel: Recent Labs  Lab 04/29/20 0857 05/02/20 0644  NA 141 136  K 4.0 4.6  CL 102 103  CO2 29 24  GLUCOSE 115* 126*  BUN 49* 35*  CREATININE 0.83 0.63  CALCIUM 9.0 8.5*  MG 2.1 2.1  PHOS 2.4*  --     ABG: No results for input(s): PHART, PCO2ART, PO2ART, HCO3, O2SAT in the last 168 hours.  Liver Function Tests: No results for input(s): AST, ALT, ALKPHOS, BILITOT, PROT, ALBUMIN in the last 168 hours. No results for input(s): LIPASE, AMYLASE in the last 168 hours. No results for input(s): AMMONIA in the last 168 hours.  CBC: Recent Labs  Lab 04/29/20 0857 05/02/20 0644  WBC 11.7* 5.0  HGB 9.6* 8.7*  HCT 32.1* 29.6*  MCV 87.7 86.8  PLT 284 365    Cardiac Enzymes: No results for input(s): CKTOTAL, CKMB, CKMBINDEX,  TROPONINI in the last 168 hours.  BNP (last 3 results) Recent Labs    02/10/20 0355  BNP 675.6*    ProBNP (last 3 results) No results for input(s): PROBNP in the last 8760 hours.  Radiological Exams: No results found.  Assessment/Plan Active Problems:   Acute on chronic respiratory failure with hypoxia (HCC)   Cardiac arrest (HCC)   COPD, severe (HCC)   Coronary artery disease involving native coronary artery of native heart   Atrial fibrillation, rapid (HCC)   1. Acute on chronic respiratory failure with hypoxia unfortunately patient remains on T collar not able to do much in the way of weaning.  Right now is on 35% FiO2 good saturations noted otherwise.  Secretions have been cultured and treated previously.  The last set of cultures done on August 27 was showing budding yeast along with some gram-negative's will await identification. 2. Cardiac arrest his rhythm has been stabilized with no further arrhythmias noted. 3. Severe COPD medical management nebulizers as necessary 4. Coronary artery disease stable we will continue to follow 5. Rapid atrial fibrillation) rate controlled we will continue with supportive care   I have personally seen and evaluated the patient, evaluated laboratory and imaging results, formulated the assessment and plan and placed orders. The Patient requires high complexity decision making with multiple systems involvement.  Rounds were done with the Respiratory Therapy Director and Staff therapists and  discussed with nursing staff also.  Allyne Gee, MD [facility] Pulmonary Critical Care Medicine Sleep Medicine

## 2020-05-06 LAB — BASIC METABOLIC PANEL
Anion gap: 11 (ref 5–15)
BUN: 50 mg/dL — ABNORMAL HIGH (ref 8–23)
CO2: 29 mmol/L (ref 22–32)
Calcium: 8.9 mg/dL (ref 8.9–10.3)
Chloride: 94 mmol/L — ABNORMAL LOW (ref 98–111)
Creatinine, Ser: 0.71 mg/dL (ref 0.61–1.24)
GFR calc Af Amer: >60 mL/min (ref >60)
GFR calc non Af Amer: >60 mL/min (ref >60)
Glucose, Bld: 102 mg/dL — ABNORMAL HIGH (ref 70–99)
Potassium: 4.3 mmol/L (ref 3.5–5.1)
Sodium: 134 mmol/L — ABNORMAL LOW (ref 135–145)

## 2020-05-06 LAB — MAGNESIUM: Magnesium: 2 mg/dL (ref 1.7–2.4)

## 2020-05-06 LAB — CBC
HCT: 30.1 % — ABNORMAL LOW (ref 39.0–52.0)
Hemoglobin: 8.9 g/dL — ABNORMAL LOW (ref 13.0–17.0)
MCH: 25.1 pg — ABNORMAL LOW (ref 26.0–34.0)
MCHC: 29.6 g/dL — ABNORMAL LOW (ref 30.0–36.0)
MCV: 85 fL (ref 80.0–100.0)
Platelets: 414 10*3/uL — ABNORMAL HIGH (ref 150–400)
RBC: 3.54 MIL/uL — ABNORMAL LOW (ref 4.22–5.81)
RDW: 15.8 % — ABNORMAL HIGH (ref 11.5–15.5)
WBC: 5.3 10*3/uL (ref 4.0–10.5)
nRBC: 0 % (ref 0.0–0.2)

## 2020-05-06 NOTE — Progress Notes (Signed)
Pulmonary Critical Care Medicine [facility] [place]   PULMONARY CRITICAL CARE SERVICE  PROGRESS NOTE  Date of Service: 05/06/2020  Tony Munoz  [id number]  DOB: 09/10/1955   DOA: 03/04/2020  Referring Physician: Carron Curie, MD  HPI: Tony Munoz is a 64 y.o. male seen for follow up of Acute on Chronic Respiratory Failure.  Patient currently is on T collar has been on 35% FiO2 secretions remain copious  Medications: Reviewed on Rounds  Physical Exam:  Vitals: Temperature is 97.8 pulse 95 respiratory 34 blood pressure is 114/62 saturations 94%  Ventilator Settings off the ventilator on T collar FiO2 35%  . General: Comfortable at this time . Eyes: Grossly normal lids, irises & conjunctiva . ENT: grossly tongue is normal . Neck: no obvious mass . Cardiovascular: S1 S2 normal no gallop . Respiratory: No rhonchi no rales. . Abdomen: soft . Skin: no rash seen on limited exam . Musculoskeletal: not rigid . Psychiatric:unable to assess . Neurologic: no seizure no involuntary movements         Lab Data:   Basic Metabolic Panel: Recent Labs  Lab 05/02/20 0644 05/06/20 0500  NA 136 134*  K 4.6 4.3  CL 103 94*  CO2 24 29  GLUCOSE 126* 102*  BUN 35* 50*  CREATININE 0.63 0.71  CALCIUM 8.5* 8.9  MG 2.1 2.0    ABG: No results for input(s): PHART, PCO2ART, PO2ART, HCO3, O2SAT in the last 168 hours.  Liver Function Tests: No results for input(s): AST, ALT, ALKPHOS, BILITOT, PROT, ALBUMIN in the last 168 hours. No results for input(s): LIPASE, AMYLASE in the last 168 hours. No results for input(s): AMMONIA in the last 168 hours.  CBC: Recent Labs  Lab 05/02/20 0644 05/06/20 0500  WBC 5.0 5.3  HGB 8.7* 8.9*  HCT 29.6* 30.1*  MCV 86.8 85.0  PLT 365 414*    Cardiac Enzymes: No results for input(s): CKTOTAL, CKMB, CKMBINDEX, TROPONINI in the last 168 hours.  BNP (last 3 results) Recent Labs    02/10/20 0355  BNP 675.6*    ProBNP  (last 3 results) No results for input(s): PROBNP in the last 8760 hours.  Radiological Exams: No results found.  Assessment/Plan Active Problems:   Acute on chronic respiratory failure with hypoxia (HCC)   Cardiac arrest (HCC)   COPD, severe (HCC)   Coronary artery disease involving native coronary artery of native heart   Atrial fibrillation, rapid (HCC)   1. Acute on chronic respiratory failure hypoxia patient currently is on T collar FiO2 35% continue secretion management supportive care. 2. Cardiac arrest rhythm stable continue to follow 3. Severe COPD at baseline 4. Coronary artery disease continue with present medical management 5. Atrial fibrillation rate controlled follow-up   I have personally seen and evaluated the patient, evaluated laboratory and imaging results, formulated the assessment and plan and placed orders. The Patient requires high complexity decision making with multiple systems involvement.  Rounds were done with the Respiratory Therapy Director and Staff therapists and discussed with nursing staff also.  Yevonne Pax, MD [facility] Pulmonary Critical Care Medicine Sleep Medicine

## 2020-05-07 LAB — CULTURE, RESPIRATORY W GRAM STAIN: Gram Stain: NONE SEEN

## 2020-05-07 NOTE — Progress Notes (Addendum)
Pulmonary Critical Care Medicine [facility] [place]   PULMONARY CRITICAL CARE SERVICE  PROGRESS NOTE  Date of Service: 05/07/2020  GORDEN STTHOMAS  ZOX:[id number]  DOB: [birth year]   DOA: 03/04/2020  Referring Physician: Carron Curie, MD  HPI: Tony Munoz is a 64 y.o. male seen for follow up of Acute on Chronic Respiratory Failure.  Patient remains on 28% aerosol trach collar satting well with no fever at this time does have a moderate amount of secretions however they are improving.  Medications: Reviewed on Rounds  Physical Exam:  Vitals: Pulse 100 respirations 31 BP 114/65 O2 sat 93% temp 98.7  Ventilator Settings 28% ATC  . General: Comfortable at this time . Eyes: Grossly normal lids, irises & conjunctiva . ENT: grossly tongue is normal . Neck: no obvious mass . Cardiovascular: S1 S2 normal no gallop . Respiratory: Coarse breath sounds . Abdomen: soft . Skin: no rash seen on limited exam . Musculoskeletal: not rigid . Psychiatric:unable to assess . Neurologic: no seizure no involuntary movements         Lab Data:   Basic Metabolic Panel: Recent Labs  Lab 05/02/20 0644 05/06/20 0500  NA 136 134*  K 4.6 4.3  CL 103 94*  CO2 24 29  GLUCOSE 126* 102*  BUN 35* 50*  CREATININE 0.63 0.71  CALCIUM 8.5* 8.9  MG 2.1 2.0    ABG: No results for input(s): PHART, PCO2ART, PO2ART, HCO3, O2SAT in the last 168 hours.  Liver Function Tests: No results for input(s): AST, ALT, ALKPHOS, BILITOT, PROT, ALBUMIN in the last 168 hours. No results for input(s): LIPASE, AMYLASE in the last 168 hours. No results for input(s): AMMONIA in the last 168 hours.  CBC: Recent Labs  Lab 05/02/20 0644 05/06/20 0500  WBC 5.0 5.3  HGB 8.7* 8.9*  HCT 29.6* 30.1*  MCV 86.8 85.0  PLT 365 414*    Cardiac Enzymes: No results for input(s): CKTOTAL, CKMB, CKMBINDEX, TROPONINI in the last 168 hours.  BNP (last 3 results) Recent Labs    02/10/20 0355  BNP 675.6*     ProBNP (last 3 results) No results for input(s): PROBNP in the last 8760 hours.  Radiological Exams: No results found.  Assessment/Plan Active Problems:   Acute on chronic respiratory failure with hypoxia (HCC)   Cardiac arrest (HCC)   COPD, severe (HCC)   Coronary artery disease involving native coronary artery of native heart   Atrial fibrillation, rapid (HCC)   1. Acute on chronic respiratory failure hypoxia patient currently is on T collar FiO2 28% continue secretion management supportive care. 2. Cardiac arrest rhythm stable continue to follow 3. Severe COPD at baseline 4. Coronary artery disease continue with present medical management 5. Atrial fibrillation rate controlled follow-up   I have personally seen and evaluated the patient, evaluated laboratory and imaging results, formulated the assessment and plan and placed orders. The Patient requires high complexity decision making with multiple systems involvement.  Rounds were done with the Respiratory Therapy Director and Staff therapists and discussed with nursing staff also.  Yevonne Pax, MD [facility] Pulmonary Critical Care Medicine Sleep Medicine

## 2020-05-08 NOTE — Progress Notes (Addendum)
Pulmonary Critical Care Medicine [facility] [place]   PULMONARY CRITICAL CARE SERVICE  PROGRESS NOTE  Date of Service: 05/08/2020  Tony Munoz  PZW:[id number]  DOB: 1955/10/19   DOA: 03/04/2020  Referring Physician: Carron Curie, MD  HPI: Tony Munoz is a 64 y.o. male seen for follow up of Acute on Chronic Respiratory Failure.  Patient mains on 35% aerosol trach collar today satting well no fever or distress.  Medications: Reviewed on Rounds  Physical Exam:  Vitals: Pulse 90 respirations 26 BP 115/59 O2 sat 98% temp 97.4  Ventilator Settings aerosol trach collar 35%  . General: Comfortable at this time . Eyes: Grossly normal lids, irises & conjunctiva . ENT: grossly tongue is normal . Neck: no obvious mass . Cardiovascular: S1 S2 normal no gallop . Respiratory: No rales or rhonchi noted . Abdomen: soft . Skin: no rash seen on limited exam . Musculoskeletal: not rigid . Psychiatric:unable to assess . Neurologic: no seizure no involuntary movements         Lab Data:   Basic Metabolic Panel: Recent Labs  Lab 05/02/20 0644 05/06/20 0500  NA 136 134*  K 4.6 4.3  CL 103 94*  CO2 24 29  GLUCOSE 126* 102*  BUN 35* 50*  CREATININE 0.63 0.71  CALCIUM 8.5* 8.9  MG 2.1 2.0    ABG: No results for input(s): PHART, PCO2ART, PO2ART, HCO3, O2SAT in the last 168 hours.  Liver Function Tests: No results for input(s): AST, ALT, ALKPHOS, BILITOT, PROT, ALBUMIN in the last 168 hours. No results for input(s): LIPASE, AMYLASE in the last 168 hours. No results for input(s): AMMONIA in the last 168 hours.  CBC: Recent Labs  Lab 05/02/20 0644 05/06/20 0500  WBC 5.0 5.3  HGB 8.7* 8.9*  HCT 29.6* 30.1*  MCV 86.8 85.0  PLT 365 414*    Cardiac Enzymes: No results for input(s): CKTOTAL, CKMB, CKMBINDEX, TROPONINI in the last 168 hours.  BNP (last 3 results) Recent Labs    02/10/20 0355  BNP 675.6*    ProBNP (last 3 results) No results for  input(s): PROBNP in the last 8760 hours.  Radiological Exams: No results found.  Assessment/Plan Active Problems:   Acute on chronic respiratory failure with hypoxia (HCC)   Cardiac arrest (HCC)   COPD, severe (HCC)   Coronary artery disease involving native coronary artery of native heart   Atrial fibrillation, rapid (HCC)   1. Acute on chronic respiratory failure hypoxia patient currently is on T collar FiO2 35% continue secretion management supportive care. 2. Cardiac arrest rhythm stable continue to follow 3. Severe COPD at baseline 4. Coronary artery disease continue with present medical management 5. Atrial fibrillation rate controlled follow-up   I have personally seen and evaluated the patient, evaluated laboratory and imaging results, formulated the assessment and plan and placed orders. The Patient requires high complexity decision making with multiple systems involvement.  Rounds were done with the Respiratory Therapy Director and Staff therapists and discussed with nursing staff also.  Yevonne Pax, MD [facility] Pulmonary Critical Care Medicine Sleep Medicine

## 2020-05-09 NOTE — Progress Notes (Signed)
PROGRESS NOTE    Tony Munoz  NLZ:[id number] DOB: 04/28/1956 DOA: 03/04/2020  Brief Narrative:  Tony Munoz is an 64 y.o. male with past medical history significant for hypertension, carotid artery stenosis, nicotine abuse, multivessel coronary artery disease who was admitted to the outside hospital for carotid endarterectomy and coronary bypass surgery.  He was found to have significant coronary artery disease.  He underwent CABG x4 utilizing LIMA to LAD, RIMA to posterior descending coronary artery and SVG to the first and second diagonal branches of the LAD.  He also underwent harvesting of greater saphenous from right lower leg.  Patient tolerated the procedure.  However, his stay was complicated with rapid atrial fibrillation and worsening respiratory distress.  He was initiated on amiodarone.  Pulmonary critical care team was consulted and the patient had to be intubated.  On postop day 3 patient apparently self extubated therefore had to be reintubated.  He is status post bronchoscopy with removal of aspirated blood.  Patient also developed alcohol withdrawal.  Due to inability to wean from the vent he underwent tracheostomy on 02/15/2020.  He was treated with multiple antibiotics for suspected aspiration pneumonia.  Patient apparently coded on postoperative day 14 and had return of spontaneous circulation after CPR and IV epinephrine.  He continued to require significant ventilator support therefore was transferred to [facility] for further management on 03/04/2020.  After admission here he had respiratory cultures collected on 03/15/2020 which showed a few Enterobacter cloacae, few Enterococcus faecalis.  He had subsequent urine cultures that were collected on 03/15/2020 which showed 50,000 colonies per mL of Enterobacter cloacae.  Patient again had respiratory cultures on 03/21/2020 due to increasing secretions which came back as normal respiratory flora.  He received treatment with  ciprofloxacin and Flagyl which completed on 03/28/2020.  He continued to have increasing secretions.  CT of the chest without contrast on 03/30/2020 per report persistent moderate to large bilateral pleural effusions with near complete collapse of the left upper lobe, new extensive consolidation within the left upper lobe concerning for pneumonia or aspiration. He had repeat cultures collected on 03/31/2020 which are shoowed few Pseudomonas aeruginosa. He received treatment with IV vancomycin, meropenem.  He had ultrasound-guided thoracentesis on the left side in July 2021 with removal of 250 mL of fluid.   He again had respiratory cultures that were collected on 04/17/2020 that showed moderate Pseudomonas aeruginosa, moderate group B strep.  The Pseudomonas was resistant to imipenem therefore he was treated with ciprofloxacin.  After that he had worsening leukocytosis with low-grade fevers, increased secretions and for repeat respiratory cultures collected on 05/03/2020 that is showing moderate Enterobacter cloacae, few Pseudomonas aeruginosa which is sensitive to meropenem.  Therefore now started on meropenem.  Flagyl was also added for very high suspicion for aspiration.  Assessment & Plan: Active Problems: Acute on chronic respiratory failure with hypoxia Pneumonia with Pseudomonas, Enterobacter Pleural effusion/left-sided empyema? UTI with Enterobacter Cardiac arrest  COPD, severe Acute encephalopathy Dysphagia History of alcohol abuse Coronary artery disease involving native coronary artery of native heart Atrial fibrillation  Acute on chronic respiratory failure with hypoxia: He is on 35% trach collar.  He had CT done on 03/30/2020 which per report moderate to large bilateral pleural effusions with near complete collapse of the left upper lobe.  Concern for aspiration/superimposed pneumonia.  Left-sided pleural effusion per report complex suggestive of empyema.  He was previously treated with  ciprofloxacin and Flagyl.  After that he received treatment with  IV vancomycin, meropenem.  He had thoracentesis done.  His recent respiratory cultures from 05/03/2020 per report showing moderate Enterobacter cloacae, few Pseudomonas aeruginosa.  Both of these organisms are susceptible to meropenem.  He is currently on meropenem.  Flagyl was also added.  We will plan to treat for tentative duration of 1 week pending improvement.  He still having some cough with secretions but overall he says he feels better.  Tentative end date for the antibiotics on 05/13/2020.  However, he has dysphagia and concern for aspiration therefore high risk for recurrent and worsening pneumonia secondary to aspiration despite being on antibiotics.  Pulmonary following.  Pneumonia: Patient has had respiratory cultures from 05/03/2020 which showed Enterobacter cloacae, Pseudomonas aeruginosa.  He also had previous respiratory cultures that showed Pseudomonas.  He has received multiple rounds of antibiotics.  Currently on treatment with IV meropenem, Flagyl added. Will plan to treat for duration of 1 week pending improvement. Again, as mentioned above due to his dysphagia and concern for aspiration he is at risk for recurrent and worsening pneumonia.  Please monitor BUN/cr closely while on antibiotics.  Pleural effusion/left-sided empyema: He is status post drainage.  Pleural fluid cultures were negative.  However, now respiratory culture showing Pseudomonas, Enterobacter. On antibiotics as mentioned above.   UTI: He had previous UTI with urine culture that showed Enterobacter.  He is status post treatment with Cipro.  COPD: He has advanced COPD/emphysema.  On 35% FiO2. Pulmonary following.  Continue medications and management per primary team.   Multivessel coronary artery disease with significant carotid artery disease: He is status post CABG x4+ left carotid endarterectomy.  Continue medications per the primary team.     Acute encephalopathy: He has history of alcohol abuse.  On Celexa, Klonopin and low-dose Seroquel.  Also treated with thiamine, folate, multivitamin.  Continue supportive management per primary team.  Dysphagia: Unfortunately due to his dysphagia he is at risk for aspiration and worsening respiratory failure secondary to aspiration pneumonia.   Atrial fibrillation: Continue rate control medications per the primary team.  Unfortunately due to his complex medical problems he is at risk for worsening and decompensation.  Plan of care discussed with the primary team and pharmacy.    Subjective: He is complaining of some cough, he also has secretions.  Mental status improved.  Objective: Vitals: Temperature 96.8, pulse 87, respiratory 23, blood pressure 112/65, pulse oximetry 98%  Examination: General exam: Thin appearing male, awake, oriented HEENT: Atraumatic, normocephalic, pupils equal and reactive, no ear or nose lesions Neck: Has trach in place Respiratory system: Rhonchi, occasional wheezing Cardiovascular system: S1 & S2 heard, RRR. No murmur. Gastrointestinal system: Abdomen is nondistended, soft and nontender. No masses felt. Normal bowel sounds heard. Central nervous system: Alert and oriented.  He has debility with generalized weakness otherwise grossly nonfocal Extremities: No lower extremity edema Skin: No rashes Psychiatry: Mood & affect appropriate.     Data Reviewed: I have personally reviewed following labs and imaging studies  CBC: Recent Labs  Lab 05/06/20 0500  WBC 5.3  HGB 8.9*  HCT 30.1*  MCV 85.0  PLT 414*    Basic Metabolic Panel: Recent Labs  Lab 05/06/20 0500  NA 134*  K 4.3  CL 94*  CO2 29  GLUCOSE 102*  BUN 50*  CREATININE 0.71  CALCIUM 8.9  MG 2.0    GFR: CrCl cannot be calculated (Unknown ideal weight.).  Liver Function Tests: No results for input(s): AST, ALT, ALKPHOS, BILITOT, PROT, ALBUMIN in  the last 168  hours.  CBG: No results for input(s): GLUCAP in the last 168 hours.   Recent Results (from the past 240 hour(s))  Culture, respiratory     Status: None   Collection Time: 05/03/20  9:35 AM   Specimen: Tracheal Aspirate; Respiratory  Result Value Ref Range Status   Specimen Description TRACHEAL ASPIRATE  Final   Special Requests NONE  Final   Gram Stain   Final    NO WBC SEEN FEW GRAM POSITIVE COCCI IN PAIRS RARE GRAM NEGATIVE RODS RARE BUDDING YEAST SEEN Performed at [facility], [address], [place], [place] [address]    Culture   Final    MODERATE ENTEROBACTER CLOACAE FEW PSEUDOMONAS AERUGINOSA    Report Status 05/07/2020 FINAL  Final   Organism ID, Bacteria ENTEROBACTER CLOACAE  Final   Organism ID, Bacteria PSEUDOMONAS AERUGINOSA  Final      Susceptibility   Enterobacter cloacae - MIC*    CEFAZOLIN >=64 RESISTANT Resistant     CEFEPIME <=0.12 SENSITIVE Sensitive     CEFTAZIDIME >=64 RESISTANT Resistant     CIPROFLOXACIN <=0.25 SENSITIVE Sensitive     GENTAMICIN <=1 SENSITIVE Sensitive     IMIPENEM 0.5 SENSITIVE Sensitive     TRIMETH/SULFA <=20 SENSITIVE Sensitive     PIP/TAZO >=128 RESISTANT Resistant     * MODERATE ENTEROBACTER CLOACAE   Pseudomonas aeruginosa - MIC*    CEFTAZIDIME 4 SENSITIVE Sensitive     CIPROFLOXACIN 0.5 SENSITIVE Sensitive     GENTAMICIN <=1 SENSITIVE Sensitive     IMIPENEM 1 SENSITIVE Sensitive     CEFEPIME 4 SENSITIVE Sensitive     * FEW PSEUDOMONAS AERUGINOSA         Radiology Studies: No results found.   Scheduled Meds: Please see MAR   Vonzella Nipple, MD  05/09/2020, 2:21 PM

## 2020-05-09 NOTE — Progress Notes (Addendum)
Pulmonary Critical Care Medicine [facility] [place]   PULMONARY CRITICAL CARE SERVICE  PROGRESS NOTE  Date of Service: 05/09/2020  Tony Munoz  TDD:[id number]  DOB: 09-Aug-1956   DOA: 03/04/2020  Referring Physician: Carron Curie, MD  HPI: Tony Munoz is a 64 y.o. male seen for follow up of Acute on Chronic Respiratory Failure.  Patient remains on aerosol trach collar decreased to 35% FiO2 currently satting well no fever or distress  Medications: Reviewed on Rounds  Physical Exam:  Vitals: Pulse 88 respirations 26 BP 113/68 O2 sat 97% temp 97.5  Ventilator Settings ATC 35%  . General: Comfortable at this time . Eyes: Grossly normal lids, irises & conjunctiva . ENT: grossly tongue is normal . Neck: no obvious mass . Cardiovascular: S1 S2 normal no gallop . Respiratory: Coarse breath sounds . Abdomen: soft . Skin: no rash seen on limited exam . Musculoskeletal: not rigid . Psychiatric:unable to assess . Neurologic: no seizure no involuntary movements         Lab Data:   Basic Metabolic Panel: Recent Labs  Lab 05/06/20 0500  NA 134*  K 4.3  CL 94*  CO2 29  GLUCOSE 102*  BUN 50*  CREATININE 0.71  CALCIUM 8.9  MG 2.0    ABG: No results for input(s): PHART, PCO2ART, PO2ART, HCO3, O2SAT in the last 168 hours.  Liver Function Tests: No results for input(s): AST, ALT, ALKPHOS, BILITOT, PROT, ALBUMIN in the last 168 hours. No results for input(s): LIPASE, AMYLASE in the last 168 hours. No results for input(s): AMMONIA in the last 168 hours.  CBC: Recent Labs  Lab 05/06/20 0500  WBC 5.3  HGB 8.9*  HCT 30.1*  MCV 85.0  PLT 414*    Cardiac Enzymes: No results for input(s): CKTOTAL, CKMB, CKMBINDEX, TROPONINI in the last 168 hours.  BNP (last 3 results) Recent Labs    02/10/20 0355  BNP 675.6*    ProBNP (last 3 results) No results for input(s): PROBNP in the last 8760 hours.  Radiological Exams: No results  found.  Assessment/Plan Active Problems:   Acute on chronic respiratory failure with hypoxia (HCC)   Cardiac arrest (HCC)   COPD, severe (HCC)   Coronary artery disease involving native coronary artery of native heart   Atrial fibrillation, rapid (HCC)   1. Acute on chronic respiratory failure hypoxia patient currently is on T collar FiO235% continue secretion management supportive care. 2. Cardiac arrest rhythm stable continue to follow 3. Severe COPD at baseline 4. Coronary artery disease continue with present medical management 5. Atrial fibrillation rate controlled follow-up   I have personally seen and evaluated the patient, evaluated laboratory and imaging results, formulated the assessment and plan and placed orders. The Patient requires high complexity decision making with multiple systems involvement.  Rounds were done with the Respiratory Therapy Director and Staff therapists and discussed with nursing staff also.  Yevonne Pax, MD [facility] Pulmonary Critical Care Medicine Sleep Medicine

## 2020-05-10 LAB — BASIC METABOLIC PANEL
Anion gap: 9 (ref 5–15)
BUN: 39 mg/dL — ABNORMAL HIGH (ref 8–23)
CO2: 29 mmol/L (ref 22–32)
Calcium: 8.5 mg/dL — ABNORMAL LOW (ref 8.9–10.3)
Chloride: 96 mmol/L — ABNORMAL LOW (ref 98–111)
Creatinine, Ser: 0.77 mg/dL (ref 0.61–1.24)
GFR calc Af Amer: >60 mL/min (ref >60)
GFR calc non Af Amer: >60 mL/min (ref >60)
Glucose, Bld: 104 mg/dL — ABNORMAL HIGH (ref 70–99)
Potassium: 3.7 mmol/L (ref 3.5–5.1)
Sodium: 134 mmol/L — ABNORMAL LOW (ref 135–145)

## 2020-05-10 LAB — PHOSPHORUS: Phosphorus: 1.9 mg/dL — ABNORMAL LOW (ref 2.5–4.6)

## 2020-05-10 LAB — CBC
HCT: 28.7 % — ABNORMAL LOW (ref 39.0–52.0)
Hemoglobin: 8.5 g/dL — ABNORMAL LOW (ref 13.0–17.0)
MCH: 25.4 pg — ABNORMAL LOW (ref 26.0–34.0)
MCHC: 29.6 g/dL — ABNORMAL LOW (ref 30.0–36.0)
MCV: 85.9 fL (ref 80.0–100.0)
Platelets: 371 K/uL (ref 150–400)
RBC: 3.34 MIL/uL — ABNORMAL LOW (ref 4.22–5.81)
RDW: 16.2 % — ABNORMAL HIGH (ref 11.5–15.5)
WBC: 6.4 K/uL (ref 4.0–10.5)
nRBC: 0 % (ref 0.0–0.2)

## 2020-05-10 LAB — MAGNESIUM: Magnesium: 1.9 mg/dL (ref 1.7–2.4)

## 2020-05-10 NOTE — Progress Notes (Addendum)
Pulmonary Critical Care Medicine [facility] [place]   PULMONARY CRITICAL CARE SERVICE  PROGRESS NOTE  Date of Service: 05/10/2020  Tony Munoz  QIH:[id number]  DOB: Feb 08, 1956   DOA: 03/04/2020  Referring Physician: Carron Curie, MD  HPI: Tony Munoz is a 64 y.o. male seen for follow up of Acute on Chronic Respiratory Failure.  Patient remains on 28% aerosol trach collar satting well no fever or distress  Medications: Reviewed on Rounds  Physical Exam:  Vitals: Pulse 91 respirations 25 BP 110/57 O2 sat 95% temp 97.8  Ventilator Settings 28% ATC  . General: Comfortable at this time . Eyes: Grossly normal lids, irises & conjunctiva . ENT: grossly tongue is normal . Neck: no obvious mass . Cardiovascular: S1 S2 normal no gallop . Respiratory: No rales or rhonchi noted . Abdomen: soft . Skin: no rash seen on limited exam . Musculoskeletal: not rigid . Psychiatric:unable to assess . Neurologic: no seizure no involuntary movements         Lab Data:   Basic Metabolic Panel: Recent Labs  Lab 05/06/20 0500 05/10/20 1518  NA 134* 134*  K 4.3 3.7  CL 94* 96*  CO2 29 29  GLUCOSE 102* 104*  BUN 50* 39*  CREATININE 0.71 0.77  CALCIUM 8.9 8.5*  MG 2.0 1.9  PHOS  --  1.9*    ABG: No results for input(s): PHART, PCO2ART, PO2ART, HCO3, O2SAT in the last 168 hours.  Liver Function Tests: No results for input(s): AST, ALT, ALKPHOS, BILITOT, PROT, ALBUMIN in the last 168 hours. No results for input(s): LIPASE, AMYLASE in the last 168 hours. No results for input(s): AMMONIA in the last 168 hours.  CBC: Recent Labs  Lab 05/06/20 0500 05/10/20 1518  WBC 5.3 6.4  HGB 8.9* 8.5*  HCT 30.1* 28.7*  MCV 85.0 85.9  PLT 414* 371    Cardiac Enzymes: No results for input(s): CKTOTAL, CKMB, CKMBINDEX, TROPONINI in the last 168 hours.  BNP (last 3 results) Recent Labs    02/10/20 0355  BNP 675.6*    ProBNP (last 3 results) No results for input(s):  PROBNP in the last 8760 hours.  Radiological Exams: No results found.  Assessment/Plan Active Problems:   Acute on chronic respiratory failure with hypoxia (HCC)   Cardiac arrest (HCC)   COPD, severe (HCC)   Coronary artery disease involving native coronary artery of native heart   Atrial fibrillation, rapid (HCC)   1. Acute on chronic respiratory failure hypoxia patient currently is on T collar FiO228% continue secretion management supportive care. 2. Cardiac arrest rhythm stable continue to follow 3. Severe COPD at baseline 4. Coronary artery disease continue with present medical management 5. Atrial fibrillation rate controlled follow-up   I have personally seen and evaluated the patient, evaluated laboratory and imaging results, formulated the assessment and plan and placed orders. The Patient requires high complexity decision making with multiple systems involvement.  Rounds were done with the Respiratory Therapy Director and Staff therapists and discussed with nursing staff also.  Yevonne Pax, MD [facility] Pulmonary Critical Care Medicine Sleep Medicine

## 2020-05-11 NOTE — Progress Notes (Addendum)
Pulmonary Critical Care Medicine [facility] [place]   PULMONARY CRITICAL CARE SERVICE  PROGRESS NOTE  Date of Service: 05/11/2020  Tony Munoz  KGU:[id number]  DOB: June 04, 1956   DOA: 03/04/2020  Referring Physician: Carron Curie, MD  HPI: Tony Munoz is a 64 y.o. male seen for follow up of Acute on Chronic Respiratory Failure.  Patient is resting comfortably right now without distress at this time on T collar has been on 35% FiO2 secretions remain copious  Medications: Reviewed on Rounds  Physical Exam:  Vitals: Temperature is 96.9 pulse 88 respiratory 23 blood pressure is 116/65 saturations 97%  Ventilator Settings on T collar FiO2 35%   General: Comfortable at this time  Eyes: Grossly normal lids, irises & conjunctiva  ENT: grossly tongue is normal  Neck: no obvious mass  Cardiovascular: S1 S2 normal no gallop  Respiratory: No rhonchi no rales noted at this time  Abdomen: soft  Skin: no rash seen on limited exam  Musculoskeletal: not rigid  Psychiatric:unable to assess  Neurologic: no seizure no involuntary movements         Lab Data:   Basic Metabolic Panel: Recent Labs  Lab 05/06/20 0500 05/10/20 1518  NA 134* 134*  K 4.3 3.7  CL 94* 96*  CO2 29 29  GLUCOSE 102* 104*  BUN 50* 39*  CREATININE 0.71 0.77  CALCIUM 8.9 8.5*  MG 2.0 1.9  PHOS  --  1.9*    ABG: No results for input(s): PHART, PCO2ART, PO2ART, HCO3, O2SAT in the last 168 hours.  Liver Function Tests: No results for input(s): AST, ALT, ALKPHOS, BILITOT, PROT, ALBUMIN in the last 168 hours. No results for input(s): LIPASE, AMYLASE in the last 168 hours. No results for input(s): AMMONIA in the last 168 hours.  CBC: Recent Labs  Lab 05/06/20 0500 05/10/20 1518  WBC 5.3 6.4  HGB 8.9* 8.5*  HCT 30.1* 28.7*  MCV 85.0 85.9  PLT 414* 371    Cardiac Enzymes: No results for input(s): CKTOTAL, CKMB, CKMBINDEX, TROPONINI in the last 168 hours.  BNP (last 3  results) Recent Labs    02/10/20 0355  BNP 675.6*    ProBNP (last 3 results) No results for input(s): PROBNP in the last 8760 hours.  Radiological Exams: No results found.  Assessment/Plan Active Problems:   Acute on chronic respiratory failure with hypoxia (HCC)   Cardiac arrest (HCC)   COPD, severe (HCC)   Coronary artery disease involving native coronary artery of native heart   Atrial fibrillation, rapid (HCC)   1. Acute on chronic respiratory failure with hypoxia on T collar currently 35% FiO2 we will continue with secretion management pulmonary toilet. 2. Cardiac arrest rhythm is stable at this time 3. Severe COPD no change supportive care 4. Coronary artery disease no active pain 5. Atrial fibrillation rate is controlled   I have personally seen and evaluated the patient, evaluated laboratory and imaging results, formulated the assessment and plan and placed orders. The Patient requires high complexity decision making with multiple systems involvement.  Rounds were done with the Respiratory Therapy Director and Staff therapists and discussed with nursing staff also.  Yevonne Pax, MD [facility] Pulmonary Critical Care Medicine Sleep Medicine

## 2020-05-12 LAB — BASIC METABOLIC PANEL
Anion gap: 7 (ref 5–15)
BUN: 37 mg/dL — ABNORMAL HIGH (ref 8–23)
CO2: 31 mmol/L (ref 22–32)
Calcium: 8.4 mg/dL — ABNORMAL LOW (ref 8.9–10.3)
Chloride: 96 mmol/L — ABNORMAL LOW (ref 98–111)
Creatinine, Ser: 0.58 mg/dL — ABNORMAL LOW (ref 0.61–1.24)
GFR calc Af Amer: >60 mL/min (ref >60)
GFR calc non Af Amer: >60 mL/min (ref >60)
Glucose, Bld: 110 mg/dL — ABNORMAL HIGH (ref 70–99)
Potassium: 4.3 mmol/L (ref 3.5–5.1)
Sodium: 134 mmol/L — ABNORMAL LOW (ref 135–145)

## 2020-05-12 LAB — PHOSPHORUS: Phosphorus: 1.9 mg/dL — ABNORMAL LOW (ref 2.5–4.6)

## 2020-05-12 LAB — MAGNESIUM: Magnesium: 1.8 mg/dL (ref 1.7–2.4)

## 2020-05-12 NOTE — Progress Notes (Signed)
Pulmonary Critical Care Medicine [facility] [place]   PULMONARY CRITICAL CARE SERVICE  PROGRESS NOTE  Date of Service: 05/12/2020  Tony Munoz  [id number]  DOB: 28-Jan-1956   DOA: 03/04/2020  Referring Physician: Carron Curie, MD  HPI: Tony Munoz is a 64 y.o. male seen for follow up of Acute on Chronic Respiratory Failure.  Patient continues to be on the T collar has been on 35% FiO2 no fevers are noted overnight  Medications: Reviewed on Rounds  Physical Exam:  Vitals: Temperature is 96.9 pulse 84 respiratory 25 blood pressure is 103/56 saturations 94%  Ventilator Settings off the ventilator T collar has been on 35% FiO2  . General: Comfortable at this time . Eyes: Grossly normal lids, irises & conjunctiva . ENT: grossly tongue is normal . Neck: no obvious mass . Cardiovascular: S1 S2 normal no gallop . Respiratory: No rhonchi very coarse breath . Abdomen: soft . Skin: no rash seen on limited exam . Musculoskeletal: not rigid . Psychiatric:unable to assess . Neurologic: no seizure no involuntary movements         Lab Data:   Basic Metabolic Panel: Recent Labs  Lab 05/06/20 0500 05/10/20 1518 05/12/20 0830  NA 134* 134* 134*  K 4.3 3.7 4.3  CL 94* 96* 96*  CO2 29 29 31   GLUCOSE 102* 104* 110*  BUN 50* 39* 37*  CREATININE 0.71 0.77 0.58*  CALCIUM 8.9 8.5* 8.4*  MG 2.0 1.9 1.8  PHOS  --  1.9* 1.9*    ABG: No results for input(s): PHART, PCO2ART, PO2ART, HCO3, O2SAT in the last 168 hours.  Liver Function Tests: No results for input(s): AST, ALT, ALKPHOS, BILITOT, PROT, ALBUMIN in the last 168 hours. No results for input(s): LIPASE, AMYLASE in the last 168 hours. No results for input(s): AMMONIA in the last 168 hours.  CBC: Recent Labs  Lab 05/06/20 0500 05/10/20 1518  WBC 5.3 6.4  HGB 8.9* 8.5*  HCT 30.1* 28.7*  MCV 85.0 85.9  PLT 414* 371    Cardiac Enzymes: No results for input(s): CKTOTAL, CKMB, CKMBINDEX, TROPONINI in  the last 168 hours.  BNP (last 3 results) Recent Labs    02/10/20 0355  BNP 675.6*    ProBNP (last 3 results) No results for input(s): PROBNP in the last 8760 hours.  Radiological Exams: No results found.  Assessment/Plan Active Problems:   Acute on chronic respiratory failure with hypoxia (HCC)   Cardiac arrest (HCC)   COPD, severe (HCC)   Coronary artery disease involving native coronary artery of native heart   Atrial fibrillation, rapid (HCC)   1. Acute on chronic respiratory failure hypoxia we will continue with T collar titrate oxygen continue pulmonary toilet.  Patient still copious secretions. 2. Cardiac arrest rhythm has been stable right 3. Severe COPD we will continue with medical management nebulizers as necessary 4. Coronary artery disease no change 5. Atrial fibrillation rate is controlled right now   I have personally seen and evaluated the patient, evaluated laboratory and imaging results, formulated the assessment and plan and placed orders. The Patient requires high complexity decision making with multiple systems involvement.  Rounds were done with the Respiratory Therapy Director and Staff therapists and discussed with nursing staff also.  04/11/20, MD [facility] Pulmonary Critical Care Medicine Sleep Medicine

## 2020-05-13 LAB — CBC
HCT: 27 % — ABNORMAL LOW (ref 39.0–52.0)
Hemoglobin: 7.8 g/dL — ABNORMAL LOW (ref 13.0–17.0)
MCH: 25.1 pg — ABNORMAL LOW (ref 26.0–34.0)
MCHC: 28.9 g/dL — ABNORMAL LOW (ref 30.0–36.0)
MCV: 86.8 fL (ref 80.0–100.0)
Platelets: 305 10*3/uL (ref 150–400)
RBC: 3.11 MIL/uL — ABNORMAL LOW (ref 4.22–5.81)
RDW: 17.2 % — ABNORMAL HIGH (ref 11.5–15.5)
WBC: 5.7 10*3/uL (ref 4.0–10.5)
nRBC: 0 % (ref 0.0–0.2)

## 2020-05-13 LAB — BASIC METABOLIC PANEL
Anion gap: 7 (ref 5–15)
BUN: 36 mg/dL — ABNORMAL HIGH (ref 8–23)
CO2: 28 mmol/L (ref 22–32)
Calcium: 8.1 mg/dL — ABNORMAL LOW (ref 8.9–10.3)
Chloride: 98 mmol/L (ref 98–111)
Creatinine, Ser: 0.69 mg/dL (ref 0.61–1.24)
GFR calc Af Amer: >60 mL/min (ref >60)
GFR calc non Af Amer: >60 mL/min (ref >60)
Glucose, Bld: 100 mg/dL — ABNORMAL HIGH (ref 70–99)
Potassium: 4.5 mmol/L (ref 3.5–5.1)
Sodium: 133 mmol/L — ABNORMAL LOW (ref 135–145)

## 2020-05-13 LAB — PHOSPHORUS: Phosphorus: 2.3 mg/dL — ABNORMAL LOW (ref 2.5–4.6)

## 2020-05-13 LAB — MAGNESIUM: Magnesium: 1.9 mg/dL (ref 1.7–2.4)

## 2020-05-13 NOTE — Progress Notes (Signed)
Pulmonary Critical Care Medicine [facility] [place]   PULMONARY CRITICAL CARE SERVICE  PROGRESS NOTE  Date of Service: 05/13/2020  Tony Munoz  [id number]  DOB: 01-18-1956   DOA: 03/04/2020  Referring Physician: Carron Curie, MD  HPI: Tony Munoz is a 64 y.o. male seen for follow up of Acute on Chronic Respiratory Failure.  Patient is on T collar currently on 40% FiO2 copious secretions are still noted  Medications: Reviewed on Rounds  Physical Exam:  Vitals: Temperature is 96.7 pulse 89 respiratory 24 blood pressure is 96/55 saturations 99%  Ventilator Settings on T collar with an FiO2 of 40%  . General: Comfortable at this time . Eyes: Grossly normal lids, irises & conjunctiva . ENT: grossly tongue is normal . Neck: no obvious mass . Cardiovascular: S1 S2 normal no gallop . Respiratory: No rhonchi no rales noted . Abdomen: soft . Skin: no rash seen on limited exam . Musculoskeletal: not rigid . Psychiatric:unable to assess . Neurologic: no seizure no involuntary movements         Lab Data:   Basic Metabolic Panel: Recent Labs  Lab 05/10/20 1518 05/12/20 0830 05/13/20 0656  NA 134* 134* 133*  K 3.7 4.3 4.5  CL 96* 96* 98  CO2 29 31 28   GLUCOSE 104* 110* 100*  BUN 39* 37* 36*  CREATININE 0.77 0.58* 0.69  CALCIUM 8.5* 8.4* 8.1*  MG 1.9 1.8 1.9  PHOS 1.9* 1.9* 2.3*    ABG: No results for input(s): PHART, PCO2ART, PO2ART, HCO3, O2SAT in the last 168 hours.  Liver Function Tests: No results for input(s): AST, ALT, ALKPHOS, BILITOT, PROT, ALBUMIN in the last 168 hours. No results for input(s): LIPASE, AMYLASE in the last 168 hours. No results for input(s): AMMONIA in the last 168 hours.  CBC: Recent Labs  Lab 05/10/20 1518 05/13/20 0656  WBC 6.4 5.7  HGB 8.5* 7.8*  HCT 28.7* 27.0*  MCV 85.9 86.8  PLT 371 305    Cardiac Enzymes: No results for input(s): CKTOTAL, CKMB, CKMBINDEX, TROPONINI in the last 168 hours.  BNP (last 3  results) Recent Labs    02/10/20 0355  BNP 675.6*    ProBNP (last 3 results) No results for input(s): PROBNP in the last 8760 hours.  Radiological Exams: No results found.  Assessment/Plan Active Problems:   Acute on chronic respiratory failure with hypoxia (HCC)   Cardiac arrest (HCC)   COPD, severe (HCC)   Coronary artery disease involving native coronary artery of native heart   Atrial fibrillation, rapid (HCC)   1. Acute on chronic respiratory failure hypoxia we will continue with on T collar currently on FiO2 of 40%.  Continue aggressive pulmonary toilet supportive care. 2. Cardiac arrest rhythm is stable at this time 3. Severe COPD medical management 4. Coronary artery disease supportive care 5. Rapid atrial fibrillation rate is controlled   I have personally seen and evaluated the patient, evaluated laboratory and imaging results, formulated the assessment and plan and placed orders. The Patient requires high complexity decision making with multiple systems involvement.  Rounds were done with the Respiratory Therapy Director and Staff therapists and discussed with nursing staff also.  04/11/20, MD [facility] Pulmonary Critical Care Medicine Sleep Medicine

## 2020-05-14 NOTE — Progress Notes (Addendum)
Pulmonary Critical Care Medicine [facility] [place]   PULMONARY CRITICAL CARE SERVICE  PROGRESS NOTE  Date of Service: 05/14/2020  Tony Munoz  [id number]  DOB: 04-17-1956   DOA: 03/04/2020  Referring Physician: Carron Curie, MD  HPI: Tony Munoz is a 64 y.o. male seen for follow up of Acute on Chronic Respiratory Failure. PT continues on 28% ATC, satting well.  NAD noted at this time.   Medications: Reviewed on Rounds  Physical Exam:  Vitals: pulse 100, resp 26, bp 106/67, sat 95. Temp 97.3  Ventilator Settings 28% ATC.  [name] General: Comfortable at this time . Eyes: Grossly normal lids, irises & conjunctiva . ENT: grossly tongue is normal . Neck: no obvious mass . Cardiovascular: S1 S2 normal no gallop . Respiratory: no rales or ronchi noted . Abdomen: soft . Skin: no rash seen on limited exam . Musculoskeletal: not rigid . Psychiatric:unable to assess . Neurologic: no seizure no involuntary movements         Lab Data:   Basic Metabolic Panel: Recent Labs  Lab 05/10/20 1518 05/12/20 0830 05/13/20 0656  NA 134* 134* 133*  K 3.7 4.3 4.5  CL 96* 96* 98  CO2 29 31 28   GLUCOSE 104* 110* 100*  BUN 39* 37* 36*  CREATININE 0.77 0.58* 0.69  CALCIUM 8.5* 8.4* 8.1*  MG 1.9 1.8 1.9  PHOS 1.9* 1.9* 2.3*    ABG: No results for input(s): PHART, PCO2ART, PO2ART, HCO3, O2SAT in the last 168 hours.  Liver Function Tests: No results for input(s): AST, ALT, ALKPHOS, BILITOT, PROT, ALBUMIN in the last 168 hours. No results for input(s): LIPASE, AMYLASE in the last 168 hours. No results for input(s): AMMONIA in the last 168 hours.  CBC: Recent Labs  Lab 05/10/20 1518 05/13/20 0656  WBC 6.4 5.7  HGB 8.5* 7.8*  HCT 28.7* 27.0*  MCV 85.9 86.8  PLT 371 305    Cardiac Enzymes: No results for input(s): CKTOTAL, CKMB, CKMBINDEX, TROPONINI in the last 168 hours.  BNP (last 3 results) Recent Labs    02/10/20 0355  BNP 675.6*    ProBNP (last 3  results) No results for input(s): PROBNP in the last 8760 hours.  Radiological Exams: No results found.  Assessment/Plan Active Problems:   Acute on chronic respiratory failure with hypoxia (HCC)   Cardiac arrest (HCC)   COPD, severe (HCC)   Coronary artery disease involving native coronary artery of native heart   Atrial fibrillation, rapid (HCC)   1. Acute on chronic respiratory failure hypoxia we will continue with on T collar currently on FiO2 of 28%.  Continue aggressive pulmonary toilet supportive care. 2. Cardiac arrest rhythm is stable at this time 3. Severe COPD medical management 4. Coronary artery disease supportive care 5. Rapid atrial fibrillation rate is controlled   I have personally seen and evaluated the patient, evaluated laboratory and imaging results, formulated the assessment and plan and placed orders. The Patient requires high complexity decision making with multiple systems involvement.  Rounds were done with the Respiratory Therapy Director and Staff therapists and discussed with nursing staff also.  04/11/20, MD [facility] Pulmonary Critical Care Medicine Sleep Medicine

## 2020-05-15 LAB — BASIC METABOLIC PANEL
Anion gap: 5 (ref 5–15)
BUN: 38 mg/dL — ABNORMAL HIGH (ref 8–23)
CO2: 32 mmol/L (ref 22–32)
Calcium: 8.3 mg/dL — ABNORMAL LOW (ref 8.9–10.3)
Chloride: 97 mmol/L — ABNORMAL LOW (ref 98–111)
Creatinine, Ser: 0.57 mg/dL — ABNORMAL LOW (ref 0.61–1.24)
GFR calc Af Amer: >60 mL/min (ref >60)
GFR calc non Af Amer: >60 mL/min (ref >60)
Glucose, Bld: 84 mg/dL (ref 70–99)
Potassium: 4.4 mmol/L (ref 3.5–5.1)
Sodium: 134 mmol/L — ABNORMAL LOW (ref 135–145)

## 2020-05-15 NOTE — Progress Notes (Addendum)
Pulmonary Critical Care Medicine [facility] [place]   PULMONARY CRITICAL CARE SERVICE  PROGRESS NOTE  Date of Service: 05/15/2020  Tony Munoz  [id number]  DOB: 1956-08-02   DOA: 03/04/2020  Referring Physician: Carron Curie, MD  HPI: Tony Munoz is a 64 y.o. male seen for follow up of Acute on Chronic Respiratory Failure. Pt remains on ATC 28% fio2.  Sating well with no distress noted.   Medications: Reviewed on Rounds  Physical Exam:  Vitals: pulse 93, resp 23, bp 111/66, sat 98%, temp 96.8  Ventilator Settings ATC 28%  . General: Comfortable at this time . Eyes: Grossly normal lids, irises & conjunctiva . ENT: grossly tongue is normal . Neck: no obvious mass . Cardiovascular: S1 S2 normal no gallop . Respiratory: no rales or ronchi noted. . Abdomen: soft . Skin: no rash seen on limited exam . Musculoskeletal: not rigid . Psychiatric:unable to assess . Neurologic: no seizure no involuntary movements         Lab Data:   Basic Metabolic Panel: Recent Labs  Lab 05/10/20 1518 05/12/20 0830 05/13/20 0656 05/15/20 0825  NA 134* 134* 133* 134*  K 3.7 4.3 4.5 4.4  CL 96* 96* 98 97*  CO2 29 31 28  32  GLUCOSE 104* 110* 100* 84  BUN 39* 37* 36* 38*  CREATININE 0.77 0.58* 0.69 0.57*  CALCIUM 8.5* 8.4* 8.1* 8.3*  MG 1.9 1.8 1.9  --   PHOS 1.9* 1.9* 2.3*  --     ABG: No results for input(s): PHART, PCO2ART, PO2ART, HCO3, O2SAT in the last 168 hours.  Liver Function Tests: No results for input(s): AST, ALT, ALKPHOS, BILITOT, PROT, ALBUMIN in the last 168 hours. No results for input(s): LIPASE, AMYLASE in the last 168 hours. No results for input(s): AMMONIA in the last 168 hours.  CBC: Recent Labs  Lab 05/10/20 1518 05/13/20 0656  WBC 6.4 5.7  HGB 8.5* 7.8*  HCT 28.7* 27.0*  MCV 85.9 86.8  PLT 371 305    Cardiac Enzymes: No results for input(s): CKTOTAL, CKMB, CKMBINDEX, TROPONINI in the last 168 hours.  BNP (last 3  results) Recent Labs    02/10/20 0355  BNP 675.6*    ProBNP (last 3 results) No results for input(s): PROBNP in the last 8760 hours.  Radiological Exams: No results found.  Assessment/Plan Active Problems:   Acute on chronic respiratory failure with hypoxia (HCC)   Cardiac arrest (HCC)   COPD, severe (HCC)   Coronary artery disease involving native coronary artery of native heart   Atrial fibrillation, rapid (HCC)   1. Acute on chronic respiratory failure hypoxia we will continue with on T collar currently on FiO2 of 28%. Continue aggressive pulmonary toilet supportive care. 2. Cardiac arrest rhythm is stable at this time 3. Severe COPD medical management 4. Coronary artery disease supportive care 5. Rapid atrial fibrillation rate is controlled   I have personally seen and evaluated the patient, evaluated laboratory and imaging results, formulated the assessment and plan and placed orders. The Patient requires high complexity decision making with multiple systems involvement.  Rounds were done with the Respiratory Therapy Director and Staff therapists and discussed with nursing staff also.  04/11/20, MD [facility] Pulmonary Critical Care Medicine Sleep Medicine

## 2020-05-16 NOTE — Progress Notes (Signed)
Pulmonary Critical Care Medicine [facility] [place]   PULMONARY CRITICAL CARE SERVICE  PROGRESS NOTE  Date of Service: 05/16/2020  Tony Munoz  [id number]  DOB: 12-24-1955   DOA: 03/04/2020  Referring Physician: Carron Curie, MD  HPI: Tony Munoz is a 64 y.o. male seen for follow up of Acute on Chronic Respiratory Failure.  Patient is currently on T collar good saturations are noted right now on 28% FiO2  Medications: Reviewed on Rounds  Physical Exam:  Vitals: Temperature is 96.7 pulse 103 respiratory rate 34 blood pressure is 107/59 saturations 94%  Ventilator Settings on T collar FiO2 28%  . General: Comfortable at this time . Eyes: Grossly normal lids, irises & conjunctiva . ENT: grossly tongue is normal . Neck: no obvious mass . Cardiovascular: S1 S2 normal no gallop . Respiratory: Scattered rhonchi very coarse breath sounds . Abdomen: soft . Skin: no rash seen on limited exam . Musculoskeletal: not rigid . Psychiatric:unable to assess . Neurologic: no seizure no involuntary movements         Lab Data:   Basic Metabolic Panel: Recent Labs  Lab 05/10/20 1518 05/12/20 0830 05/13/20 0656 05/15/20 0825  NA 134* 134* 133* 134*  K 3.7 4.3 4.5 4.4  CL 96* 96* 98 97*  CO2 29 31 28  32  GLUCOSE 104* 110* 100* 84  BUN 39* 37* 36* 38*  CREATININE 0.77 0.58* 0.69 0.57*  CALCIUM 8.5* 8.4* 8.1* 8.3*  MG 1.9 1.8 1.9  --   PHOS 1.9* 1.9* 2.3*  --     ABG: No results for input(s): PHART, PCO2ART, PO2ART, HCO3, O2SAT in the last 168 hours.  Liver Function Tests: No results for input(s): AST, ALT, ALKPHOS, BILITOT, PROT, ALBUMIN in the last 168 hours. No results for input(s): LIPASE, AMYLASE in the last 168 hours. No results for input(s): AMMONIA in the last 168 hours.  CBC: Recent Labs  Lab 05/10/20 1518 05/13/20 0656  WBC 6.4 5.7  HGB 8.5* 7.8*  HCT 28.7* 27.0*  MCV 85.9 86.8  PLT 371 305    Cardiac Enzymes: No results for  input(s): CKTOTAL, CKMB, CKMBINDEX, TROPONINI in the last 168 hours.  BNP (last 3 results) Recent Labs    02/10/20 0355  BNP 675.6*    ProBNP (last 3 results) No results for input(s): PROBNP in the last 8760 hours.  Radiological Exams: No results found.  Assessment/Plan Active Problems:   Acute on chronic respiratory failure with hypoxia (HCC)   Cardiac arrest (HCC)   COPD, severe (HCC)   Coronary artery disease involving native coronary artery of native heart   Atrial fibrillation, rapid (HCC)   1. Acute on chronic respiratory failure hypoxia plan is to continue with T collar trials currently on 20% FiO2 good saturations. 2. Cardiac arrest rhythm is stable at this time we will continue to monitor 3. Severe COPD continue with medical management 4. Coronary artery disease supportive care 5. Rapid atrial fibrillation rate is controlled at this time   I have personally seen and evaluated the patient, evaluated laboratory and imaging results, formulated the assessment and plan and placed orders. The Patient requires high complexity decision making with multiple systems involvement.  Rounds were done with the Respiratory Therapy Director and Staff therapists and discussed with nursing staff also.  04/11/20, MD [facility] Pulmonary Critical Care Medicine Sleep Medicine

## 2020-05-17 NOTE — Progress Notes (Addendum)
Pulmonary Critical Care Medicine [facility] [place]   PULMONARY CRITICAL CARE SERVICE  PROGRESS NOTE  Date of Service: 05/17/2020  Tony Munoz  [id number]  DOB: 1956/01/21   DOA: 03/04/2020  Referring Physician: Carron Curie, MD  HPI: Tony Munoz is a 64 y.o. male seen for follow up of Acute on Chronic Respiratory Failure.  Patient remains on 28% aerosol trach collar satting well no fever distress.    Medications: Reviewed on Rounds  Physical Exam:  Vitals: Pulse 96 respirations 22 BP 107/63 O2 sat 100% temp 96.9  Ventilator Settings 28% ATC  . General: Comfortable at this time . Eyes: Grossly normal lids, irises & conjunctiva . ENT: grossly tongue is normal . Neck: no obvious mass . Cardiovascular: S1 S2 normal no gallop . Respiratory: No rales or rhonchi noted . Abdomen: soft . Skin: no rash seen on limited exam . Musculoskeletal: not rigid . Psychiatric:unable to assess . Neurologic: no seizure no involuntary movements         Lab Data:   Basic Metabolic Panel: Recent Labs  Lab 05/12/20 0830 05/13/20 0656 05/15/20 0825  NA 134* 133* 134*  K 4.3 4.5 4.4  CL 96* 98 97*  CO2 31 28 32  GLUCOSE 110* 100* 84  BUN 37* 36* 38*  CREATININE 0.58* 0.69 0.57*  CALCIUM 8.4* 8.1* 8.3*  MG 1.8 1.9  --   PHOS 1.9* 2.3*  --     ABG: No results for input(s): PHART, PCO2ART, PO2ART, HCO3, O2SAT in the last 168 hours.  Liver Function Tests: No results for input(s): AST, ALT, ALKPHOS, BILITOT, PROT, ALBUMIN in the last 168 hours. No results for input(s): LIPASE, AMYLASE in the last 168 hours. No results for input(s): AMMONIA in the last 168 hours.  CBC: Recent Labs  Lab 05/13/20 0656  WBC 5.7  HGB 7.8*  HCT 27.0*  MCV 86.8  PLT 305    Cardiac Enzymes: No results for input(s): CKTOTAL, CKMB, CKMBINDEX, TROPONINI in the last 168 hours.  BNP (last 3 results) Recent Labs    02/10/20 0355  BNP 675.6*    ProBNP (last 3 results) No  results for input(s): PROBNP in the last 8760 hours.  Radiological Exams: No results found.  Assessment/Plan Active Problems:   Acute on chronic respiratory failure with hypoxia (HCC)   Cardiac arrest (HCC)   COPD, severe (HCC)   Coronary artery disease involving native coronary artery of native heart   Atrial fibrillation, rapid (HCC)   1. Acute on chronic respiratory failure hypoxia plan is to continue with T collar trials currently on 28% FiO2 good saturations.  Continue aggressive pulmonary toilet and secretion management. 2. Cardiac arrest rhythm is stable at this time we will continue to monitor 3. Severe COPD continue with medical management 4. Coronary artery disease supportive care 5. Rapid atrial fibrillation rate is controlled at this time   I have personally seen and evaluated the patient, evaluated laboratory and imaging results, formulated the assessment and plan and placed orders. The Patient requires high complexity decision making with multiple systems involvement.  Rounds were done with the Respiratory Therapy Director and Staff therapists and discussed with nursing staff also.  Yevonne Pax, MD [facility] Pulmonary Critical Care Medicine Sleep Medicine

## 2020-05-18 NOTE — Progress Notes (Signed)
pulmonary Critical Care Medicine [facility] [place]   PULMONARY CRITICAL CARE SERVICE  PROGRESS NOTE  Date of Service: 05/18/2020  Tony Munoz  [id number]  DOB: June 11, 1956   DOA: 03/04/2020  Referring Physician: Carron Curie, MD  HPI: Tony Munoz is a 64 y.o. male seen for follow up of Acute on Chronic Respiratory Failure.  Patient is on T collar right now appears to be comfortable without distress.  Secretions still remain quite copious.  Has been requiring 28% FiO2  Medications: Reviewed on Rounds  Physical Exam:  Vitals: Temperature is 97.2 pulse 105 respiratory rate 27 blood pressure is 95/50  Ventilator Settings nightly is off the ventilator on T collar has been on 28% failure to  . General: Comfortable at this time . Eyes: Grossly normal lids, irises & conjunctiva . ENT: grossly tongue is normal . Neck: no obvious mass . Cardiovascular: S1 S2 normal no gallop . Respiratory: Scattered rhonchi expansion is equal . Abdomen: soft . Skin: no rash seen on limited exam . Musculoskeletal: not rigid . Psychiatric:unable to assess . Neurologic: no seizure no involuntary movements         Lab Data:   Basic Metabolic Panel: Recent Labs  Lab 05/12/20 0830 05/13/20 0656 05/15/20 0825  NA 134* 133* 134*  K 4.3 4.5 4.4  CL 96* 98 97*  CO2 31 28 32  GLUCOSE 110* 100* 84  BUN 37* 36* 38*  CREATININE 0.58* 0.69 0.57*  CALCIUM 8.4* 8.1* 8.3*  MG 1.8 1.9  --   PHOS 1.9* 2.3*  --     ABG: No results for input(s): PHART, PCO2ART, PO2ART, HCO3, O2SAT in the last 168 hours.  Liver Function Tests: No results for input(s): AST, ALT, ALKPHOS, BILITOT, PROT, ALBUMIN in the last 168 hours. No results for input(s): LIPASE, AMYLASE in the last 168 hours. No results for input(s): AMMONIA in the last 168 hours.  CBC: Recent Labs  Lab 05/13/20 0656  WBC 5.7  HGB 7.8*  HCT 27.0*  MCV 86.8  PLT 305    Cardiac Enzymes: No results for input(s):  CKTOTAL, CKMB, CKMBINDEX, TROPONINI in the last 168 hours.  BNP (last 3 results) Recent Labs    02/10/20 0355  BNP 675.6*    ProBNP (last 3 results) No results for input(s): PROBNP in the last 8760 hours.  Radiological Exams: No results found.  Assessment/Plan Active Problems:   Acute on chronic respiratory failure with hypoxia (HCC)   Cardiac arrest (HCC)   COPD, severe (HCC)   Coronary artery disease involving native coronary artery of native heart   Atrial fibrillation, rapid (HCC)   1. Acute on chronic respiratory failure with hypoxia patient right now on T collar currently on 28% oxygen 2. cardiac arrest rhythm has been stable we will continue to monitor. 3. Coronary artery disease stable we will continue with supportive care 4. atrial fibrillation rate is controlled we will continue to monitor closely and continue with medical management   I have personally seen and evaluated the patient, evaluated laboratory and imaging results, formulated the assessment and plan and placed orders. The Patient requires high complexity decision making with multiple systems involvement.  Rounds were done with the Respiratory Therapy Director and Staff therapists and discussed with nursing staff also.  Tony Pax, MD [facility] Pulmonary Critical Care Medicine Sleep Medicine

## 2020-05-19 LAB — CBC
HCT: 24.6 % — ABNORMAL LOW (ref 39.0–52.0)
Hemoglobin: 7 g/dL — ABNORMAL LOW (ref 13.0–17.0)
MCH: 25.6 pg — ABNORMAL LOW (ref 26.0–34.0)
MCHC: 28.5 g/dL — ABNORMAL LOW (ref 30.0–36.0)
MCV: 90.1 fL (ref 80.0–100.0)
Platelets: 253 10*3/uL (ref 150–400)
RBC: 2.73 MIL/uL — ABNORMAL LOW (ref 4.22–5.81)
RDW: 20.6 % — ABNORMAL HIGH (ref 11.5–15.5)
WBC: 5.1 10*3/uL (ref 4.0–10.5)
nRBC: 0 % (ref 0.0–0.2)

## 2020-05-19 LAB — BASIC METABOLIC PANEL
Anion gap: 9 (ref 5–15)
BUN: 56 mg/dL — ABNORMAL HIGH (ref 8–23)
CO2: 30 mmol/L (ref 22–32)
Calcium: 8.9 mg/dL (ref 8.9–10.3)
Chloride: 99 mmol/L (ref 98–111)
Creatinine, Ser: 0.77 mg/dL (ref 0.61–1.24)
GFR calc Af Amer: >60 mL/min (ref >60)
GFR calc non Af Amer: >60 mL/min (ref >60)
Glucose, Bld: 120 mg/dL — ABNORMAL HIGH (ref 70–99)
Potassium: 4 mmol/L (ref 3.5–5.1)
Sodium: 138 mmol/L (ref 135–145)

## 2020-05-19 LAB — MAGNESIUM: Magnesium: 2.2 mg/dL (ref 1.7–2.4)

## 2020-05-19 LAB — PREPARE RBC (CROSSMATCH)

## 2020-05-19 NOTE — Progress Notes (Signed)
Pulmonary Critical Care Medicine [facility] [place]   PULMONARY CRITICAL CARE SERVICE  PROGRESS NOTE  Date of Service: 05/19/2020  Tony Munoz  [id number]:[id number]  DOB: [birth year]   DOA: 03/04/2020  Referring Physician: Carron Curie, MD  HPI: Tony Munoz is a 64 y.o. male seen for follow up of Acute on Chronic Respiratory Failure.  Patient currently is on T collar has been on 28% FiO2 with good saturations.  Medications: Reviewed on Rounds  Physical Exam:  Vitals: Temperature is 96.8 pulse 103 respiratory rate 24 blood pressure is 103/56 saturations 94%  Ventilator Settings off the ventilator on T collar FiO2 28%  . General: Comfortable at this time . Eyes: Grossly normal lids, irises & conjunctiva . ENT: grossly tongue is normal . Neck: no obvious mass . Cardiovascular: S1 S2 normal no gallop . Respiratory: No rhonchi no rales are noted at this time . Abdomen: soft . Skin: no rash seen on limited exam . Musculoskeletal: not rigid . Psychiatric:unable to assess . Neurologic: no seizure no involuntary movements         Lab Data:   Basic Metabolic Panel: Recent Labs  Lab 05/13/20 0656 05/15/20 0825 05/19/20 0655  NA 133* 134* 138  K 4.5 4.4 4.0  CL 98 97* 99  CO2 28 32 30  GLUCOSE 100* 84 120*  BUN 36* 38* 56*  CREATININE 0.69 0.57* 0.77  CALCIUM 8.1* 8.3* 8.9  MG 1.9  --  2.2  PHOS 2.3*  --   --     ABG: No results for input(s): PHART, PCO2ART, PO2ART, HCO3, O2SAT in the last 168 hours.  Liver Function Tests: No results for input(s): AST, ALT, ALKPHOS, BILITOT, PROT, ALBUMIN in the last 168 hours. No results for input(s): LIPASE, AMYLASE in the last 168 hours. No results for input(s): AMMONIA in the last 168 hours.  CBC: Recent Labs  Lab 05/13/20 0656 05/19/20 0655  WBC 5.7 5.1  HGB 7.8* 7.0*  HCT 27.0* 24.6*  MCV 86.8 90.1  PLT 305 253    Cardiac Enzymes: No results for input(s): CKTOTAL, CKMB, CKMBINDEX, TROPONINI in the last  168 hours.  BNP (last 3 results) Recent Labs    02/10/20 0355  BNP 675.6*    ProBNP (last 3 results) No results for input(s): PROBNP in the last 8760 hours.  Radiological Exams: No results found.  Assessment/Plan Active Problems:   Acute on chronic respiratory failure with hypoxia (HCC)   Cardiac arrest (HCC)   COPD, severe (HCC)   Coronary artery disease involving native coronary artery of native heart   Atrial fibrillation, rapid (HCC)   1. Acute on chronic respiratory failure with hypoxia we will continue with T collar trials titrate oxygen continue pulmonary toilet patient still has copious secretions. 2. Cardiac arrest rhythm has been stable 3. Severe COPD at baseline continue to monitor 4. Coronary artery disease medical management 5. Atrial fibrillation rate is controlled   I have personally seen and evaluated the patient, evaluated laboratory and imaging results, formulated the assessment and plan and placed orders. The Patient requires high complexity decision making with multiple systems involvement.  Rounds were done with the Respiratory Therapy Director and Staff therapists and discussed with nursing staff also.  Yevonne Pax, MD [facility] Pulmonary Critical Care Medicine Sleep Medicine

## 2020-05-20 LAB — CBC
HCT: 25.4 % — ABNORMAL LOW (ref 39.0–52.0)
Hemoglobin: 7.9 g/dL — ABNORMAL LOW (ref 13.0–17.0)
MCH: 28 pg (ref 26.0–34.0)
MCHC: 31.1 g/dL (ref 30.0–36.0)
MCV: 90.1 fL (ref 80.0–100.0)
Platelets: 275 10*3/uL (ref 150–400)
RBC: 2.82 MIL/uL — ABNORMAL LOW (ref 4.22–5.81)
RDW: 19.5 % — ABNORMAL HIGH (ref 11.5–15.5)
WBC: 5.6 10*3/uL (ref 4.0–10.5)
nRBC: 0 % (ref 0.0–0.2)

## 2020-05-20 LAB — TYPE AND SCREEN
ABO/RH(D): A POS
Antibody Screen: NEGATIVE
Unit division: 0

## 2020-05-20 LAB — BPAM RBC
Blood Product Expiration Date: 202110032359
ISSUE DATE / TIME: 202109121311
Unit Type and Rh: 6200

## 2020-05-20 NOTE — Progress Notes (Signed)
Pulmonary Critical Care Medicine [facility] [place]   PULMONARY CRITICAL CARE SERVICE  PROGRESS NOTE  Date of Service: 05/20/2020  Tony Munoz  VZD:[id number]  DOB: April 30, 1956   DOA: 03/04/2020  Referring Physician: Carron Curie, MD  HPI: Tony Munoz is a 64 y.o. male seen for follow up of Acute on Chronic Respiratory Failure.  Patient is currently on T collar 28% FiO2 good saturations are noted  Medications: Reviewed on Rounds  Physical Exam:  Vitals: Temperature is 97.0 pulse 94 respiratory rate 24 blood pressure is 107/65 saturations 99%  Ventilator Settings on T collar FiO2 28%  . General: Comfortable at this time . Eyes: Grossly normal lids, irises & conjunctiva . ENT: grossly tongue is normal . Neck: no obvious mass . Cardiovascular: S1 S2 normal no gallop . Respiratory: No rhonchi very coarse breath sounds . Abdomen: soft . Skin: no rash seen on limited exam . Musculoskeletal: not rigid . Psychiatric:unable to assess . Neurologic: no seizure no involuntary movements         Lab Data:   Basic Metabolic Panel: Recent Labs  Lab 05/15/20 0825 05/19/20 0655  NA 134* 138  K 4.4 4.0  CL 97* 99  CO2 32 30  GLUCOSE 84 120*  BUN 38* 56*  CREATININE 0.57* 0.77  CALCIUM 8.3* 8.9  MG  --  2.2    ABG: No results for input(s): PHART, PCO2ART, PO2ART, HCO3, O2SAT in the last 168 hours.  Liver Function Tests: No results for input(s): AST, ALT, ALKPHOS, BILITOT, PROT, ALBUMIN in the last 168 hours. No results for input(s): LIPASE, AMYLASE in the last 168 hours. No results for input(s): AMMONIA in the last 168 hours.  CBC: Recent Labs  Lab 05/19/20 0655  WBC 5.1  HGB 7.0*  HCT 24.6*  MCV 90.1  PLT 253    Cardiac Enzymes: No results for input(s): CKTOTAL, CKMB, CKMBINDEX, TROPONINI in the last 168 hours.  BNP (last 3 results) Recent Labs    02/10/20 0355  BNP 675.6*    ProBNP (last 3 results) No results for input(s): PROBNP in  the last 8760 hours.  Radiological Exams: No results found.  Assessment/Plan Active Problems:   Acute on chronic respiratory failure with hypoxia (HCC)   Cardiac arrest (HCC)   COPD, severe (HCC)   Coronary artery disease involving native coronary artery of native heart   Atrial fibrillation, rapid (HCC)   1. Acute on chronic respiratory failure with hypoxia we will continue with T collar trials titrate oxygen as tolerated patient is at baseline with copious secretions cannot be decannulated 2. Cardiac arrest rhythm stable 3. Severe COPD medical management 4. Coronary artery disease medical management 5. Atrial fibrillation rate controlled   I have personally seen and evaluated the patient, evaluated laboratory and imaging results, formulated the assessment and plan and placed orders. The Patient requires high complexity decision making with multiple systems involvement.  Rounds were done with the Respiratory Therapy Director and Staff therapists and discussed with nursing staff also.  Yevonne Pax, MD [facility] Pulmonary Critical Care Medicine Sleep Medicine

## 2020-05-21 NOTE — Progress Notes (Addendum)
Pulmonary Critical Care Medicine [facility] [place]   PULMONARY CRITICAL CARE SERVICE  PROGRESS NOTE  Date of Service: 05/21/2020  Tony Munoz  [id number]:[id number]  DOB: 1956/01/13   DOA: 03/04/2020  Referring Physician: Carron Curie, MD  HPI: Tony Munoz is a 64 y.o. male seen for follow up of Acute on Chronic Respiratory Failure. Patient remains on ATC 28% sating well with no acute distress noted.   Medications: Reviewed on Rounds  Physical Exam:  Vitals: Pulse 98, resp 30, bp 116/70, o2 sat 93%, temp 96.5  Ventilator Settings 28% ATC  . General: Comfortable at this time . Eyes: Grossly normal lids, irises & conjunctiva . ENT: grossly tongue is normal . Neck: no obvious mass . Cardiovascular: S1 S2 normal no gallop . Respiratory: coarse breath sounds . Abdomen: soft . Skin: no rash seen on limited exam . Musculoskeletal: not rigid . Psychiatric:unable to assess . Neurologic: no seizure no involuntary movements         Lab Data:   Basic Metabolic Panel: Recent Labs  Lab 05/15/20 0825 05/19/20 0655  NA 134* 138  K 4.4 4.0  CL 97* 99  CO2 32 30  GLUCOSE 84 120*  BUN 38* 56*  CREATININE 0.57* 0.77  CALCIUM 8.3* 8.9  MG  --  2.2    ABG: No results for input(s): PHART, PCO2ART, PO2ART, HCO3, O2SAT in the last 168 hours.  Liver Function Tests: No results for input(s): AST, ALT, ALKPHOS, BILITOT, PROT, ALBUMIN in the last 168 hours. No results for input(s): LIPASE, AMYLASE in the last 168 hours. No results for input(s): AMMONIA in the last 168 hours.  CBC: Recent Labs  Lab 05/19/20 0655 05/20/20 0903  WBC 5.1 5.6  HGB 7.0* 7.9*  HCT 24.6* 25.4*  MCV 90.1 90.1  PLT 253 275    Cardiac Enzymes: No results for input(s): CKTOTAL, CKMB, CKMBINDEX, TROPONINI in the last 168 hours.  BNP (last 3 results) Recent Labs    02/10/20 0355  BNP 675.6*    ProBNP (last 3 results) No results for input(s): PROBNP in the last 8760  hours.  Radiological Exams: No results found.  Assessment/Plan Active Problems:   Acute on chronic respiratory failure with hypoxia (HCC)   Cardiac arrest (HCC)   COPD, severe (HCC)   Coronary artery disease involving native coronary artery of native heart   Atrial fibrillation, rapid (HCC)   1. Acute on chronic respiratory failure with hypoxia we will continue with T collar trials titrate oxygen as tolerated patient is at baseline with copious secretions cannot be decannulated 2. Cardiac arrest rhythm stable 3. Severe COPD medical management 4. Coronary artery disease medical management 5. Atrial fibrillation rate controlled   I have personally seen and evaluated the patient, evaluated laboratory and imaging results, formulated the assessment and plan and placed orders. The Patient requires high complexity decision making with multiple systems involvement.  Rounds were done with the Respiratory Therapy Director and Staff therapists and discussed with nursing staff also.  Yevonne Pax, MD [facility] Pulmonary Critical Care Medicine Sleep Medicine

## 2020-05-22 NOTE — Progress Notes (Addendum)
Pulmonary Critical Care Medicine [facility] [place]   PULMONARY CRITICAL CARE SERVICE  PROGRESS NOTE  Date of Service: 05/22/2020  Tony Munoz  [id number]  DOB: 1955/09/22   DOA: 03/04/2020  Referring Physician: Carron Curie, MD  HPI: Tony Munoz is a 64 y.o. male seen for follow up of Acute on Chronic Respiratory Failure.  Patient mains on 28% aerosol trach collar at his baseline satting well no fever or distress.  Medications: Reviewed on Rounds  Physical Exam:  Vitals: Pulse 92 respirations 26 BP 113/66 O2 sat 92% temp 97 point  Ventilator Settings 28% ATC  . General: Comfortable at this time . Eyes: Grossly normal lids, irises & conjunctiva . ENT: grossly tongue is normal . Neck: no obvious mass . Cardiovascular: S1 S2 normal no gallop . Respiratory: No rales or rhonchi noted . Abdomen: soft . Skin: no rash seen on limited exam . Musculoskeletal: not rigid . Psychiatric:unable to assess . Neurologic: no seizure no involuntary movements         Lab Data:   Basic Metabolic Panel: Recent Labs  Lab 05/19/20 0655  NA 138  K 4.0  CL 99  CO2 30  GLUCOSE 120*  BUN 56*  CREATININE 0.77  CALCIUM 8.9  MG 2.2    ABG: No results for input(s): PHART, PCO2ART, PO2ART, HCO3, O2SAT in the last 168 hours.  Liver Function Tests: No results for input(s): AST, ALT, ALKPHOS, BILITOT, PROT, ALBUMIN in the last 168 hours. No results for input(s): LIPASE, AMYLASE in the last 168 hours. No results for input(s): AMMONIA in the last 168 hours.  CBC: Recent Labs  Lab 05/19/20 0655 05/20/20 0903  WBC 5.1 5.6  HGB 7.0* 7.9*  HCT 24.6* 25.4*  MCV 90.1 90.1  PLT 253 275    Cardiac Enzymes: No results for input(s): CKTOTAL, CKMB, CKMBINDEX, TROPONINI in the last 168 hours.  BNP (last 3 results) Recent Labs    02/10/20 0355  BNP 675.6*    ProBNP (last 3 results) No results for input(s): PROBNP in the last 8760 hours.  Radiological  Exams: No results found.  Assessment/Plan Active Problems:   Acute on chronic respiratory failure with hypoxia (HCC)   Cardiac arrest (HCC)   COPD, severe (HCC)   Coronary artery disease involving native coronary artery of native heart   Atrial fibrillation, rapid (HCC)   1. Acute on chronic respiratory failure with hypoxia we will continue with T collar trials titrate oxygen as tolerated patient is at baseline with copious secretions cannot be decannulated 2. Cardiac arrest rhythm stable 3. Severe COPD medical management 4. Coronary artery disease medical management 5. Atrial fibrillation rate controlled   I have personally seen and evaluated the patient, evaluated laboratory and imaging results, formulated the assessment and plan and placed orders. The Patient requires high complexity decision making with multiple systems involvement.  Rounds were done with the Respiratory Therapy Director and Staff therapists and discussed with nursing staff also.  Yevonne Pax, MD [facility] Pulmonary Critical Care Medicine Sleep Medicine

## 2020-05-23 LAB — BASIC METABOLIC PANEL
Anion gap: 12 (ref 5–15)
BUN: 48 mg/dL — ABNORMAL HIGH (ref 8–23)
CO2: 26 mmol/L (ref 22–32)
Calcium: 8.7 mg/dL — ABNORMAL LOW (ref 8.9–10.3)
Chloride: 100 mmol/L (ref 98–111)
Creatinine, Ser: 0.6 mg/dL — ABNORMAL LOW (ref 0.61–1.24)
GFR calc Af Amer: >60 mL/min (ref >60)
GFR calc non Af Amer: >60 mL/min (ref >60)
Glucose, Bld: 141 mg/dL — ABNORMAL HIGH (ref 70–99)
Potassium: 3.8 mmol/L (ref 3.5–5.1)
Sodium: 138 mmol/L (ref 135–145)

## 2020-05-23 LAB — CBC
HCT: 24.6 % — ABNORMAL LOW (ref 39.0–52.0)
Hemoglobin: 7.4 g/dL — ABNORMAL LOW (ref 13.0–17.0)
MCH: 27.9 pg (ref 26.0–34.0)
MCHC: 30.1 g/dL (ref 30.0–36.0)
MCV: 92.8 fL (ref 80.0–100.0)
Platelets: 257 10*3/uL (ref 150–400)
RBC: 2.65 MIL/uL — ABNORMAL LOW (ref 4.22–5.81)
RDW: 19.7 % — ABNORMAL HIGH (ref 11.5–15.5)
WBC: 3.6 10*3/uL — ABNORMAL LOW (ref 4.0–10.5)
nRBC: 0 % (ref 0.0–0.2)

## 2020-05-23 LAB — MAGNESIUM: Magnesium: 2.1 mg/dL (ref 1.7–2.4)

## 2020-05-23 LAB — PHOSPHORUS: Phosphorus: 3.8 mg/dL (ref 2.5–4.6)

## 2020-05-23 NOTE — Progress Notes (Addendum)
Pulmonary Critical Care Medicine [facility] [place]   PULMONARY CRITICAL CARE SERVICE  PROGRESS NOTE  Date of Service: 05/23/2020  Tony Munoz  UXN:[id number]  DOB: [birth year]   DOA: 03/04/2020  Referring Physician: Carron Curie, MD  HPI: Tony Munoz is a 64 y.o. male seen for follow up of Acute on Chronic Respiratory Failure.  Patient remains on T-bar 28% satting well no fever or distress  Medications: Reviewed on Rounds  Physical Exam:  Vitals: Pulse 92 respirations 22 BP 107/61 O2 sat 100% temp 96.1  Ventilator Settings 28% TBar  . General: Comfortable at this time . Eyes: Grossly normal lids, irises & conjunctiva . ENT: grossly tongue is normal . Neck: no obvious mass . Cardiovascular: S1 S2 normal no gallop . Respiratory: Coarse breath sounds . Abdomen: soft . Skin: no rash seen on limited exam . Musculoskeletal: not rigid . Psychiatric:unable to assess . Neurologic: no seizure no involuntary movements         Lab Data:   Basic Metabolic Panel: Recent Labs  Lab 05/19/20 0655 05/23/20 0820  NA 138 138  K 4.0 3.8  CL 99 100  CO2 30 26  GLUCOSE 120* 141*  BUN 56* 48*  CREATININE 0.77 0.60*  CALCIUM 8.9 8.7*  MG 2.2 2.1  PHOS  --  3.8    ABG: No results for input(s): PHART, PCO2ART, PO2ART, HCO3, O2SAT in the last 168 hours.  Liver Function Tests: No results for input(s): AST, ALT, ALKPHOS, BILITOT, PROT, ALBUMIN in the last 168 hours. No results for input(s): LIPASE, AMYLASE in the last 168 hours. No results for input(s): AMMONIA in the last 168 hours.  CBC: Recent Labs  Lab 05/19/20 0655 05/20/20 0903 05/23/20 0820  WBC 5.1 5.6 3.6*  HGB 7.0* 7.9* 7.4*  HCT 24.6* 25.4* 24.6*  MCV 90.1 90.1 92.8  PLT 253 275 257    Cardiac Enzymes: No results for input(s): CKTOTAL, CKMB, CKMBINDEX, TROPONINI in the last 168 hours.  BNP (last 3 results) Recent Labs    02/10/20 0355  BNP 675.6*    ProBNP (last 3 results) No  results for input(s): PROBNP in the last 8760 hours.  Radiological Exams: No results found.  Assessment/Plan Active Problems:   Acute on chronic respiratory failure with hypoxia (HCC)   Cardiac arrest (HCC)   COPD, severe (HCC)   Coronary artery disease involving native coronary artery of native heart   Atrial fibrillation, rapid (HCC)   1. Acute on chronic respiratory failure with hypoxia we will continue with T collar trials titrate oxygen as tolerated patient is at baseline with copious secretions cannot be decannulated 2. Cardiac arrest rhythm stable 3. Severe COPD medical management 4. Coronary artery disease medical management 5. Atrial fibrillation rate controlled   I have personally seen and evaluated the patient, evaluated laboratory and imaging results, formulated the assessment and plan and placed orders. The Patient requires high complexity decision making with multiple systems involvement.  Rounds were done with the Respiratory Therapy Director and Staff therapists and discussed with nursing staff also.  Yevonne Pax, MD [facility] Pulmonary Critical Care Medicine Sleep Medicine

## 2020-05-24 NOTE — Progress Notes (Addendum)
Pulmonary Critical Care Medicine [facility] [place]   PULMONARY CRITICAL CARE SERVICE  PROGRESS NOTE  Date of Service: 05/24/2020  Tony Munoz  [id number]  DOB: Feb 19, 1956   DOA: 03/04/2020  Referring Physician: Carron Curie, MD  HPI: Tony Munoz is a 64 y.o. male seen for follow up of Acute on Chronic Respiratory Failure.  Patient continues on 28% aerosol trach collar trach changed to a #6 cuffless today satting well no distress.  Medications: Reviewed on Rounds  Physical Exam:  Vitals: Pulse 100 respirations 23 BP 119/57 O2 sat 98% temp 98.1  Ventilator Settings 28% ATC  . General: Comfortable at this time . Eyes: Grossly normal lids, irises & conjunctiva . ENT: grossly tongue is normal . Neck: no obvious mass . Cardiovascular: S1 S2 normal no gallop . Respiratory: No rales or rhonchi noted . Abdomen: soft . Skin: no rash seen on limited exam . Musculoskeletal: not rigid . Psychiatric:unable to assess . Neurologic: no seizure no involuntary movements         Lab Data:   Basic Metabolic Panel: Recent Labs  Lab 05/19/20 0655 05/23/20 0820  NA 138 138  K 4.0 3.8  CL 99 100  CO2 30 26  GLUCOSE 120* 141*  BUN 56* 48*  CREATININE 0.77 0.60*  CALCIUM 8.9 8.7*  MG 2.2 2.1  PHOS  --  3.8    ABG: No results for input(s): PHART, PCO2ART, PO2ART, HCO3, O2SAT in the last 168 hours.  Liver Function Tests: No results for input(s): AST, ALT, ALKPHOS, BILITOT, PROT, ALBUMIN in the last 168 hours. No results for input(s): LIPASE, AMYLASE in the last 168 hours. No results for input(s): AMMONIA in the last 168 hours.  CBC: Recent Labs  Lab 05/19/20 0655 05/20/20 0903 05/23/20 0820  WBC 5.1 5.6 3.6*  HGB 7.0* 7.9* 7.4*  HCT 24.6* 25.4* 24.6*  MCV 90.1 90.1 92.8  PLT 253 275 257    Cardiac Enzymes: No results for input(s): CKTOTAL, CKMB, CKMBINDEX, TROPONINI in the last 168 hours.  BNP (last 3 results) Recent Labs    02/10/20 0355   BNP 675.6*    ProBNP (last 3 results) No results for input(s): PROBNP in the last 8760 hours.  Radiological Exams: No results found.  Assessment/Plan Active Problems:   Acute on chronic respiratory failure with hypoxia (HCC)   Cardiac arrest (HCC)   COPD, severe (HCC)   Coronary artery disease involving native coronary artery of native heart   Atrial fibrillation, rapid (HCC)   1. Acute on chronic respiratory failure with hypoxia we will continue with T collar trials titrate oxygen as tolerated patient is at baseline with copious secretions cannot be decannulated. 2. Cardiac arrest rhythm stable 3. Severe COPD medical management 4. Coronary artery disease medical management 5. Atrial fibrillation rate controlled   I have personally seen and evaluated the patient, evaluated laboratory and imaging results, formulated the assessment and plan and placed orders. The Patient requires high complexity decision making with multiple systems involvement.  Rounds were done with the Respiratory Therapy Director and Staff therapists and discussed with nursing staff also.  Yevonne Pax, MD [facility] Pulmonary Critical Care Medicine Sleep Medicine

## 2020-05-25 LAB — CBC
HCT: 23.4 % — ABNORMAL LOW (ref 39.0–52.0)
Hemoglobin: 7 g/dL — ABNORMAL LOW (ref 13.0–17.0)
MCH: 27.7 pg (ref 26.0–34.0)
MCHC: 29.9 g/dL — ABNORMAL LOW (ref 30.0–36.0)
MCV: 92.5 fL (ref 80.0–100.0)
Platelets: 287 10*3/uL (ref 150–400)
RBC: 2.53 MIL/uL — ABNORMAL LOW (ref 4.22–5.81)
RDW: 19 % — ABNORMAL HIGH (ref 11.5–15.5)
WBC: 4.3 10*3/uL (ref 4.0–10.5)
nRBC: 0 % (ref 0.0–0.2)

## 2020-05-25 LAB — PREPARE RBC (CROSSMATCH)

## 2020-05-25 NOTE — Progress Notes (Addendum)
Pulmonary Critical Care Medicine [facility] [place]   PULMONARY CRITICAL CARE SERVICE  PROGRESS NOTE  Date of Service: 05/25/2020  Tony Munoz  RSW:[id number]  DOB: 08-10-1956   DOA: 03/04/2020  Referring Physician: Carron Curie, MD  HPI: Tony Munoz is a 64 y.o. male seen for follow up of Acute on Chronic Respiratory Failure.  Patient mains on 20% T-bar satting well no fever distress.  Medications: Reviewed on Rounds  Physical Exam:  Vitals: Pulse one oh one respirations twenty-nine BP 114/68 O2 sat 99% temp 98.2  Ventilator Settings 28% T-bar  . General: Comfortable at this time . Eyes: Grossly normal lids, irises & conjunctiva . ENT: grossly tongue is normal . Neck: no obvious mass . Cardiovascular: S1 S2 normal no gallop . Respiratory: No rales or rhonchi noted . Abdomen: soft . Skin: no rash seen on limited exam . Musculoskeletal: not rigid . Psychiatric:unable to assess . Neurologic: no seizure no involuntary movements         Lab Data:   Basic Metabolic Panel: Recent Labs  Lab 05/19/20 0655 05/23/20 0820  NA 138 138  K 4.0 3.8  CL 99 100  CO2 30 26  GLUCOSE 120* 141*  BUN 56* 48*  CREATININE 0.77 0.60*  CALCIUM 8.9 8.7*  MG 2.2 2.1  PHOS  --  3.8    ABG: No results for input(s): PHART, PCO2ART, PO2ART, HCO3, O2SAT in the last 168 hours.  Liver Function Tests: No results for input(s): AST, ALT, ALKPHOS, BILITOT, PROT, ALBUMIN in the last 168 hours. No results for input(s): LIPASE, AMYLASE in the last 168 hours. No results for input(s): AMMONIA in the last 168 hours.  CBC: Recent Labs  Lab 05/19/20 0655 05/20/20 0903 05/23/20 0820 05/25/20 0554  WBC 5.1 5.6 3.6* 4.3  HGB 7.0* 7.9* 7.4* 7.0*  HCT 24.6* 25.4* 24.6* 23.4*  MCV 90.1 90.1 92.8 92.5  PLT 253 275 257 287    Cardiac Enzymes: No results for input(s): CKTOTAL, CKMB, CKMBINDEX, TROPONINI in the last 168 hours.  BNP (last 3 results) Recent Labs     02/10/20 0355  BNP 675.6*    ProBNP (last 3 results) No results for input(s): PROBNP in the last 8760 hours.  Radiological Exams: No results found.  Assessment/Plan Active Problems:   Acute on chronic respiratory failure with hypoxia (HCC)   Cardiac arrest (HCC)   COPD, severe (HCC)   Coronary artery disease involving native coronary artery of native heart   Atrial fibrillation, rapid (HCC)   1. Acute on chronic respiratory failure with hypoxia we will continue with T collar trials titrate oxygen as tolerated patient is at baseline with copious secretions cannot be decannulated. 2. Cardiac arrest rhythm stable 3. Severe COPD medical management 4. Coronary artery disease medical management 5. Atrial fibrillation rate controlled   I have personally seen and evaluated the patient, evaluated laboratory and imaging results, formulated the assessment and plan and placed orders. The Patient requires high complexity decision making with multiple systems involvement.  Rounds were done with the Respiratory Therapy Director and Staff therapists and discussed with nursing staff also.  Yevonne Pax, MD [facility] Pulmonary Critical Care Medicine Sleep Medicine

## 2020-05-26 LAB — TYPE AND SCREEN
ABO/RH(D): A POS
Antibody Screen: NEGATIVE
Unit division: 0

## 2020-05-26 LAB — CBC
HCT: 25.7 % — ABNORMAL LOW (ref 39.0–52.0)
Hemoglobin: 7.8 g/dL — ABNORMAL LOW (ref 13.0–17.0)
MCH: 27.4 pg (ref 26.0–34.0)
MCHC: 30.4 g/dL (ref 30.0–36.0)
MCV: 90.2 fL (ref 80.0–100.0)
Platelets: 273 10*3/uL (ref 150–400)
RBC: 2.85 MIL/uL — ABNORMAL LOW (ref 4.22–5.81)
RDW: 17.4 % — ABNORMAL HIGH (ref 11.5–15.5)
WBC: 4.6 10*3/uL (ref 4.0–10.5)
nRBC: 0 % (ref 0.0–0.2)

## 2020-05-26 LAB — BPAM RBC
Blood Product Expiration Date: 202110142359
ISSUE DATE / TIME: 202109181902
Unit Type and Rh: 6200

## 2020-05-26 LAB — BASIC METABOLIC PANEL
Anion gap: 11 (ref 5–15)
BUN: 53 mg/dL — ABNORMAL HIGH (ref 8–23)
CO2: 27 mmol/L (ref 22–32)
Calcium: 8.6 mg/dL — ABNORMAL LOW (ref 8.9–10.3)
Chloride: 101 mmol/L (ref 98–111)
Creatinine, Ser: 0.73 mg/dL (ref 0.61–1.24)
GFR calc Af Amer: >60 mL/min (ref >60)
GFR calc non Af Amer: >60 mL/min (ref >60)
Glucose, Bld: 121 mg/dL — ABNORMAL HIGH (ref 70–99)
Potassium: 3.8 mmol/L (ref 3.5–5.1)
Sodium: 139 mmol/L (ref 135–145)

## 2020-05-26 LAB — MAGNESIUM: Magnesium: 2 mg/dL (ref 1.7–2.4)

## 2020-05-26 LAB — PHOSPHORUS: Phosphorus: 3.7 mg/dL (ref 2.5–4.6)

## 2020-05-26 NOTE — Progress Notes (Signed)
Pulmonary Critical Care Medicine [facility] [place]   PULMONARY CRITICAL CARE SERVICE  PROGRESS NOTE  Date of Service: 05/26/2020  [name]  YHC:[id number]  DOB: 1956-06-26   DOA: 03/04/2020  Referring Physician: Carron Curie, MD  HPI: Tony Munoz is a 64 y.o. male seen for follow up of Acute on Chronic Respiratory Failure. Patient remains on the T-bar. Right now is on 28% FiO2 good saturations  Medications: Reviewed on Rounds  Physical Exam:  Vitals: Temperature is 97.6 pulse 92 respiratory rate 25 blood pressure 106/62 saturations 95%  Ventilator Settings off the ventilator on T collar at this time  . General: Comfortable at this time . Eyes: Grossly normal lids, irises & conjunctiva . ENT: grossly tongue is normal . Neck: no obvious mass . Cardiovascular: S1 S2 normal no gallop . Respiratory: No rhonchi very coarse breath sounds . Abdomen: soft . Skin: no rash seen on limited exam . Musculoskeletal: not rigid . Psychiatric:unable to assess . Neurologic: no seizure no involuntary movements         Lab Data:   Basic Metabolic Panel: Recent Labs  Lab 05/23/20 0820 05/26/20 1009  NA 138 139  K 3.8 3.8  CL 100 101  CO2 26 27  GLUCOSE 141* 121*  BUN 48* 53*  CREATININE 0.60* 0.73  CALCIUM 8.7* 8.6*  MG 2.1 2.0  PHOS 3.8 3.7    ABG: No results for input(s): PHART, PCO2ART, PO2ART, HCO3, O2SAT in the last 168 hours.  Liver Function Tests: No results for input(s): AST, ALT, ALKPHOS, BILITOT, PROT, ALBUMIN in the last 168 hours. No results for input(s): LIPASE, AMYLASE in the last 168 hours. No results for input(s): AMMONIA in the last 168 hours.  CBC: Recent Labs  Lab 05/20/20 0903 05/23/20 0820 05/25/20 0554 05/26/20 1009  WBC 5.6 3.6* 4.3 4.6  HGB 7.9* 7.4* 7.0* 7.8*  HCT 25.4* 24.6* 23.4* 25.7*  MCV 90.1 92.8 92.5 90.2  PLT 275 257 287 273    Cardiac Enzymes: No results for input(s): CKTOTAL, CKMB, CKMBINDEX, TROPONINI in  the last 168 hours.  BNP (last 3 results) Recent Labs    02/10/20 0355  BNP 675.6*    ProBNP (last 3 results) No results for input(s): PROBNP in the last 8760 hours.  Radiological Exams: No results found.  Assessment/Plan Active Problems:   Acute on chronic respiratory failure with hypoxia (HCC)   Cardiac arrest (HCC)   COPD, severe (HCC)   Coronary artery disease involving native coronary artery of native heart   Atrial fibrillation, rapid (HCC)   1. Acute on chronic respiratory failure hypoxia we will continue with the T collar patient has copious secretions requiring aggressive pulmonary toilet. 2. Cardiac arrest rhythm is stable we will continue to follow 3. Severe COPD at baseline 4. Coronary artery disease continue with supportive care. 5. Atrial fibrillation rate is controlled at this time continue medical management   I have personally seen and evaluated the patient, evaluated laboratory and imaging results, formulated the assessment and plan and placed orders. The Patient requires high complexity decision making with multiple systems involvement.  Rounds were done with the Respiratory Therapy Director and Staff therapists and discussed with nursing staff also.  Yevonne Pax, MD [facility] Pulmonary Critical Care Medicine Sleep Medicine

## 2020-05-27 NOTE — Progress Notes (Signed)
Pulmonary Critical Care Medicine [facility] [place]   PULMONARY CRITICAL CARE SERVICE  PROGRESS NOTE  Date of Service: 05/27/2020  Tony Munoz  MGQ:[id number]  DOB: 22-Apr-1956   DOA: 03/04/2020  Referring Physician: Carron Curie, MD  HPI: Tony Munoz is a 64 y.o. male seen for follow up of Acute on Chronic Respiratory Failure.  Patient currently is on T collar has been on 28% FiO2 secretions still remain quite copious.  Medications: Reviewed on Rounds  Physical Exam:  Vitals: Temperature is 97.9 pulse 86 respiratory rate 23 blood pressure is 09/11/1957 saturations 96%  Ventilator Settings on T collar FiO2 is 28%  . General: Comfortable at this time . Eyes: Grossly normal lids, irises & conjunctiva . ENT: grossly tongue is normal . Neck: no obvious mass . Cardiovascular: S1 S2 normal no gallop . Respiratory: No rhonchi very coarse breath sounds . Abdomen: soft . Skin: no rash seen on limited exam . Musculoskeletal: not rigid . Psychiatric:unable to assess . Neurologic: no seizure no involuntary movements         Lab Data:   Basic Metabolic Panel: Recent Labs  Lab 05/23/20 0820 05/26/20 1009  NA 138 139  K 3.8 3.8  CL 100 101  CO2 26 27  GLUCOSE 141* 121*  BUN 48* 53*  CREATININE 0.60* 0.73  CALCIUM 8.7* 8.6*  MG 2.1 2.0  PHOS 3.8 3.7    ABG: No results for input(s): PHART, PCO2ART, PO2ART, HCO3, O2SAT in the last 168 hours.  Liver Function Tests: No results for input(s): AST, ALT, ALKPHOS, BILITOT, PROT, ALBUMIN in the last 168 hours. No results for input(s): LIPASE, AMYLASE in the last 168 hours. No results for input(s): AMMONIA in the last 168 hours.  CBC: Recent Labs  Lab 05/20/20 0903 05/23/20 0820 05/25/20 0554 05/26/20 1009  WBC 5.6 3.6* 4.3 4.6  HGB 7.9* 7.4* 7.0* 7.8*  HCT 25.4* 24.6* 23.4* 25.7*  MCV 90.1 92.8 92.5 90.2  PLT 275 257 287 273    Cardiac Enzymes: No results for input(s): CKTOTAL, CKMB, CKMBINDEX,  TROPONINI in the last 168 hours.  BNP (last 3 results) Recent Labs    02/10/20 0355  BNP 675.6*    ProBNP (last 3 results) No results for input(s): PROBNP in the last 8760 hours.  Radiological Exams: No results found.  Assessment/Plan Active Problems:   Acute on chronic respiratory failure with hypoxia (HCC)   Cardiac arrest (HCC)   COPD, severe (HCC)   Coronary artery disease involving native coronary artery of native heart   Atrial fibrillation, rapid (HCC)   1. Acute on chronic respiratory failure hypoxemic now on 20% FiO2 at baseline patient not able to do capping because of excessive secretions and requiring frequent suctioning. 2. Cardiac arrest rhythm has been stable we will continue to monitor 3. Severe COPD at baseline we will continue supportive care 4. Coronary artery disease medical management 5. Atrial fibrillation rate is controlled right now   I have personally seen and evaluated the patient, evaluated laboratory and imaging results, formulated the assessment and plan and placed orders. The Patient requires high complexity decision making with multiple systems involvement.  Rounds were done with the Respiratory Therapy Director and Staff therapists and discussed with nursing staff also.  Tony Pax, MD [facility] Pulmonary Critical Care Medicine Sleep Medicine

## 2020-05-28 LAB — SARS CORONAVIRUS 2 (TAT 6-24 HRS): SARS Coronavirus 2: NEGATIVE

## 2020-05-28 NOTE — Progress Notes (Signed)
Pulmonary Critical Care Medicine [facility] [place]   PULMONARY CRITICAL CARE SERVICE  PROGRESS NOTE  Date of Service: 05/28/2020  Tony Munoz  JOI:[id number]  DOB: 11/20/1955   DOA: 03/04/2020  Referring Physician: Carron Curie, MD  HPI: Tony Munoz is a 64 y.o. male seen for follow up of Acute on Chronic Respiratory Failure. Patient currently is on T collar has been on 20% FiO2 secretions remain copious  Medications: Reviewed on Rounds  Physical Exam:  Vitals: Temperature is 96.3 pulse 95 respiratory rate 28 blood pressure is 111/76 saturations 95%  Ventilator Settings on T collar FiO2 28%  . General: Comfortable at this time . Eyes: Grossly normal lids, irises & conjunctiva . ENT: grossly tongue is normal . Neck: no obvious mass . Cardiovascular: S1 S2 normal no gallop . Respiratory: No rhonchi very coarse breath sounds . Abdomen: soft . Skin: no rash seen on limited exam . Musculoskeletal: not rigid . Psychiatric:unable to assess . Neurologic: no seizure no involuntary movements         Lab Data:   Basic Metabolic Panel: Recent Labs  Lab 05/23/20 0820 05/26/20 1009  NA 138 139  K 3.8 3.8  CL 100 101  CO2 26 27  GLUCOSE 141* 121*  BUN 48* 53*  CREATININE 0.60* 0.73  CALCIUM 8.7* 8.6*  MG 2.1 2.0  PHOS 3.8 3.7    ABG: No results for input(s): PHART, PCO2ART, PO2ART, HCO3, O2SAT in the last 168 hours.  Liver Function Tests: No results for input(s): AST, ALT, ALKPHOS, BILITOT, PROT, ALBUMIN in the last 168 hours. No results for input(s): LIPASE, AMYLASE in the last 168 hours. No results for input(s): AMMONIA in the last 168 hours.  CBC: Recent Labs  Lab 05/23/20 0820 05/25/20 0554 05/26/20 1009  WBC 3.6* 4.3 4.6  HGB 7.4* 7.0* 7.8*  HCT 24.6* 23.4* 25.7*  MCV 92.8 92.5 90.2  PLT 257 287 273    Cardiac Enzymes: No results for input(s): CKTOTAL, CKMB, CKMBINDEX, TROPONINI in the last 168 hours.  BNP (last 3  results) Recent Labs    02/10/20 0355  BNP 675.6*    ProBNP (last 3 results) No results for input(s): PROBNP in the last 8760 hours.  Radiological Exams: No results found.  Assessment/Plan Active Problems:   Acute on chronic respiratory failure with hypoxia (HCC)   Cardiac arrest (HCC)   COPD, severe (HCC)   Coronary artery disease involving native coronary artery of native heart   Atrial fibrillation, rapid (HCC)   1. Acute on chronic respiratory failure with hypoxia we will continue with T collar trials titrate oxygen continue pulmonary toilet.  Secretions are still significant 2. Cardiac arrest rhythm is stable we will continue to monitor 3. Severe COPD at baseline continue present management 4. Disease at baseline 5. Atrial fibrillation rate is controlled   I have personally seen and evaluated the patient, evaluated laboratory and imaging results, formulated the assessment and plan and placed orders. The Patient requires high complexity decision making with multiple systems involvement.  Rounds were done with the Respiratory Therapy Director and Staff therapists and discussed with nursing staff also.  Yevonne Pax, MD [facility] Pulmonary Critical Care Medicine Sleep Medicine

## 2020-05-29 NOTE — Progress Notes (Signed)
Pulmonary Critical Care Medicine [facility] [place]   PULMONARY CRITICAL CARE SERVICE  PROGRESS NOTE  Date of Service: 05/29/2020  Tony Munoz  OAC:[id number]  DOB: [birth year]   DOA: 03/04/2020  Referring Physician: Carron Curie, MD  HPI: Tony Munoz is a 64 y.o. male seen for follow up of Acute on Chronic Respiratory Failure.  This morning patient is using the T collar has been on 28% FiO2 and has been able to tolerate the PMV with good saturations.  Medications: Reviewed on Rounds  Physical Exam:  Vitals: Temperature is 97.9 pulse 91 respiratory rate 20 blood pressure is 94/58 saturations 98%  Ventilator Settings off the ventilator on T collar with PMV  . General: Comfortable at this time . Eyes: Grossly normal lids, irises & conjunctiva . ENT: grossly tongue is normal . Neck: no obvious mass . Cardiovascular: S1 S2 normal no gallop . Respiratory: No rhonchi very coarse breath sounds are noted at this time . Abdomen: soft . Skin: no rash seen on limited exam . Musculoskeletal: not rigid . Psychiatric:unable to assess . Neurologic: no seizure no involuntary movements         Lab Data:   Basic Metabolic Panel: Recent Labs  Lab 05/23/20 0820 05/26/20 1009  NA 138 139  K 3.8 3.8  CL 100 101  CO2 26 27  GLUCOSE 141* 121*  BUN 48* 53*  CREATININE 0.60* 0.73  CALCIUM 8.7* 8.6*  MG 2.1 2.0  PHOS 3.8 3.7    ABG: No results for input(s): PHART, PCO2ART, PO2ART, HCO3, O2SAT in the last 168 hours.  Liver Function Tests: No results for input(s): AST, ALT, ALKPHOS, BILITOT, PROT, ALBUMIN in the last 168 hours. No results for input(s): LIPASE, AMYLASE in the last 168 hours. No results for input(s): AMMONIA in the last 168 hours.  CBC: Recent Labs  Lab 05/23/20 0820 05/25/20 0554 05/26/20 1009  WBC 3.6* 4.3 4.6  HGB 7.4* 7.0* 7.8*  HCT 24.6* 23.4* 25.7*  MCV 92.8 92.5 90.2  PLT 257 287 273    Cardiac Enzymes: No results for input(s):  CKTOTAL, CKMB, CKMBINDEX, TROPONINI in the last 168 hours.  BNP (last 3 results) Recent Labs    02/10/20 0355  BNP 675.6*    ProBNP (last 3 results) No results for input(s): PROBNP in the last 8760 hours.  Radiological Exams: No results found.  Assessment/Plan Active Problems:   Acute on chronic respiratory failure with hypoxia (HCC)   Cardiac arrest (HCC)   COPD, severe (HCC)   Coronary artery disease involving native coronary artery of native heart   Atrial fibrillation, rapid (HCC)   1. Acute on chronic respiratory failure hypoxia we will continue with T collar trials and PMV.  Titrate oxygen continue secretion management pulmonary toilet.  Patient's tolerating PMV valve 2. Cardiac arrest rhythm is stable at this time 3. Severe COPD medical management 4. Coronary artery disease no change 5. Atrial fibrillation rate is controlled at this time.   I have personally seen and evaluated the patient, evaluated laboratory and imaging results, formulated the assessment and plan and placed orders. The Patient requires high complexity decision making with multiple systems involvement.  Rounds were done with the Respiratory Therapy Director and Staff therapists and discussed with nursing staff also.  Yevonne Pax, MD [facility] Pulmonary Critical Care Medicine Sleep Medicine

## 2020-05-30 NOTE — Progress Notes (Signed)
Pulmonary Critical Care Medicine [facility] [place]   PULMONARY CRITICAL CARE SERVICE  PROGRESS NOTE  Date of Service: 05/30/2020  Tony Munoz  AST:[id number]  DOB: 10-Sep-1955   DOA: 03/04/2020  Referring Physician: Carron Curie, MD  HPI: Tony Munoz is a 64 y.o. male seen for follow up of Acute on Chronic Respiratory Failure.  Patient is on T collar yesterday was able to do the PMV with good results right now is on 28% FiO2 not yet started on the PMV.  Medications: Reviewed on Rounds  Physical Exam:  Vitals: Temperature is 97.4 pulse 93 respiratory rate 16 blood pressure is 106/61 saturations 95%  Ventilator Settings on T collar with an FiO2 of 28% using PMV  . General: Comfortable at this time . Eyes: Grossly normal lids, irises & conjunctiva . ENT: grossly tongue is normal . Neck: no obvious mass . Cardiovascular: S1 S2 normal no gallop . Respiratory: No rhonchi no rales noted at this time . Abdomen: soft . Skin: no rash seen on limited exam . Musculoskeletal: not rigid . Psychiatric:unable to assess . Neurologic: no seizure no involuntary movements         Lab Data:   Basic Metabolic Panel: Recent Labs  Lab 05/26/20 1009  NA 139  K 3.8  CL 101  CO2 27  GLUCOSE 121*  BUN 53*  CREATININE 0.73  CALCIUM 8.6*  MG 2.0  PHOS 3.7    ABG: No results for input(s): PHART, PCO2ART, PO2ART, HCO3, O2SAT in the last 168 hours.  Liver Function Tests: No results for input(s): AST, ALT, ALKPHOS, BILITOT, PROT, ALBUMIN in the last 168 hours. No results for input(s): LIPASE, AMYLASE in the last 168 hours. No results for input(s): AMMONIA in the last 168 hours.  CBC: Recent Labs  Lab 05/25/20 0554 05/26/20 1009  WBC 4.3 4.6  HGB 7.0* 7.8*  HCT 23.4* 25.7*  MCV 92.5 90.2  PLT 287 273    Cardiac Enzymes: No results for input(s): CKTOTAL, CKMB, CKMBINDEX, TROPONINI in the last 168 hours.  BNP (last 3 results) Recent Labs    02/10/20 0355   BNP 675.6*    ProBNP (last 3 results) No results for input(s): PROBNP in the last 8760 hours.  Radiological Exams: No results found.  Assessment/Plan Active Problems:   Acute on chronic respiratory failure with hypoxia (HCC)   Cardiac arrest (HCC)   COPD, severe (HCC)   Coronary artery disease involving native coronary artery of native heart   Atrial fibrillation, rapid (HCC)   1. Acute on chronic respiratory failure with hypoxia try to continue with PMV secretions are fair to moderate. 2. Cardiac arrest rhythm is stable 3. Severe COPD medical management 4. Coronary artery disease-stable supportive care 5. Rapid atrial fibrillation rate controlled at this time   I have personally seen and evaluated the patient, evaluated laboratory and imaging results, formulated the assessment and plan and placed orders. The Patient requires high complexity decision making with multiple systems involvement.  Rounds were done with the Respiratory Therapy Director and Staff therapists and discussed with nursing staff also.  Yevonne Pax, MD [facility] Pulmonary Critical Care Medicine Sleep Medicine

## 2020-05-31 DIAGNOSIS — Z03818 Encounter for observation for suspected exposure to other biological agents ruled out: Secondary | ICD-10-CM | POA: Diagnosis not present

## 2020-05-31 DIAGNOSIS — E46 Unspecified protein-calorie malnutrition: Secondary | ICD-10-CM | POA: Diagnosis not present

## 2020-05-31 DIAGNOSIS — J9621 Acute and chronic respiratory failure with hypoxia: Secondary | ICD-10-CM | POA: Diagnosis not present

## 2020-05-31 DIAGNOSIS — Z872 Personal history of diseases of the skin and subcutaneous tissue: Secondary | ICD-10-CM | POA: Diagnosis not present

## 2020-05-31 DIAGNOSIS — T148XXA Other injury of unspecified body region, initial encounter: Secondary | ICD-10-CM | POA: Diagnosis not present

## 2020-05-31 DIAGNOSIS — R338 Other retention of urine: Secondary | ICD-10-CM | POA: Diagnosis not present

## 2020-05-31 DIAGNOSIS — L98499 Non-pressure chronic ulcer of skin of other sites with unspecified severity: Secondary | ICD-10-CM | POA: Diagnosis not present

## 2020-05-31 DIAGNOSIS — R531 Weakness: Secondary | ICD-10-CM | POA: Diagnosis not present

## 2020-05-31 DIAGNOSIS — K219 Gastro-esophageal reflux disease without esophagitis: Secondary | ICD-10-CM | POA: Diagnosis not present

## 2020-05-31 DIAGNOSIS — Z743 Need for continuous supervision: Secondary | ICD-10-CM | POA: Diagnosis not present

## 2020-05-31 DIAGNOSIS — I517 Cardiomegaly: Secondary | ICD-10-CM | POA: Diagnosis not present

## 2020-05-31 DIAGNOSIS — N390 Urinary tract infection, site not specified: Secondary | ICD-10-CM | POA: Diagnosis not present

## 2020-05-31 DIAGNOSIS — G4700 Insomnia, unspecified: Secondary | ICD-10-CM | POA: Diagnosis not present

## 2020-05-31 DIAGNOSIS — R0600 Dyspnea, unspecified: Secondary | ICD-10-CM | POA: Diagnosis not present

## 2020-05-31 DIAGNOSIS — R339 Retention of urine, unspecified: Secondary | ICD-10-CM | POA: Diagnosis not present

## 2020-05-31 DIAGNOSIS — J690 Pneumonitis due to inhalation of food and vomit: Secondary | ICD-10-CM | POA: Diagnosis not present

## 2020-05-31 DIAGNOSIS — F32A Depression, unspecified: Secondary | ICD-10-CM | POA: Diagnosis not present

## 2020-05-31 DIAGNOSIS — E119 Type 2 diabetes mellitus without complications: Secondary | ICD-10-CM | POA: Diagnosis not present

## 2020-05-31 DIAGNOSIS — J90 Pleural effusion, not elsewhere classified: Secondary | ICD-10-CM | POA: Diagnosis not present

## 2020-05-31 DIAGNOSIS — L22 Diaper dermatitis: Secondary | ICD-10-CM | POA: Diagnosis not present

## 2020-05-31 DIAGNOSIS — Z931 Gastrostomy status: Secondary | ICD-10-CM | POA: Diagnosis not present

## 2020-05-31 DIAGNOSIS — I82411 Acute embolism and thrombosis of right femoral vein: Secondary | ICD-10-CM | POA: Diagnosis not present

## 2020-05-31 DIAGNOSIS — R160 Hepatomegaly, not elsewhere classified: Secondary | ICD-10-CM | POA: Diagnosis not present

## 2020-05-31 DIAGNOSIS — R509 Fever, unspecified: Secondary | ICD-10-CM | POA: Diagnosis not present

## 2020-05-31 DIAGNOSIS — R778 Other specified abnormalities of plasma proteins: Secondary | ICD-10-CM | POA: Diagnosis not present

## 2020-05-31 DIAGNOSIS — J189 Pneumonia, unspecified organism: Secondary | ICD-10-CM | POA: Diagnosis not present

## 2020-05-31 DIAGNOSIS — J9601 Acute respiratory failure with hypoxia: Secondary | ICD-10-CM | POA: Diagnosis not present

## 2020-05-31 DIAGNOSIS — E43 Unspecified severe protein-calorie malnutrition: Secondary | ICD-10-CM | POA: Diagnosis not present

## 2020-05-31 DIAGNOSIS — L89159 Pressure ulcer of sacral region, unspecified stage: Secondary | ICD-10-CM | POA: Diagnosis not present

## 2020-05-31 DIAGNOSIS — J9690 Respiratory failure, unspecified, unspecified whether with hypoxia or hypercapnia: Secondary | ICD-10-CM | POA: Diagnosis not present

## 2020-05-31 DIAGNOSIS — J449 Chronic obstructive pulmonary disease, unspecified: Secondary | ICD-10-CM | POA: Diagnosis not present

## 2020-05-31 DIAGNOSIS — Z681 Body mass index (BMI) 19 or less, adult: Secondary | ICD-10-CM | POA: Diagnosis not present

## 2020-05-31 DIAGNOSIS — N319 Neuromuscular dysfunction of bladder, unspecified: Secondary | ICD-10-CM | POA: Diagnosis not present

## 2020-05-31 DIAGNOSIS — I10 Essential (primary) hypertension: Secondary | ICD-10-CM | POA: Diagnosis not present

## 2020-05-31 DIAGNOSIS — L89154 Pressure ulcer of sacral region, stage 4: Secondary | ICD-10-CM | POA: Diagnosis not present

## 2020-05-31 DIAGNOSIS — J439 Emphysema, unspecified: Secondary | ICD-10-CM | POA: Diagnosis not present

## 2020-05-31 DIAGNOSIS — G8929 Other chronic pain: Secondary | ICD-10-CM | POA: Diagnosis not present

## 2020-05-31 DIAGNOSIS — Z951 Presence of aortocoronary bypass graft: Secondary | ICD-10-CM | POA: Diagnosis not present

## 2020-05-31 DIAGNOSIS — F419 Anxiety disorder, unspecified: Secondary | ICD-10-CM | POA: Diagnosis not present

## 2020-05-31 DIAGNOSIS — R198 Other specified symptoms and signs involving the digestive system and abdomen: Secondary | ICD-10-CM | POA: Diagnosis not present

## 2020-05-31 DIAGNOSIS — G928 Other toxic encephalopathy: Secondary | ICD-10-CM | POA: Diagnosis not present

## 2020-05-31 DIAGNOSIS — I482 Chronic atrial fibrillation, unspecified: Secondary | ICD-10-CM | POA: Diagnosis not present

## 2020-05-31 DIAGNOSIS — I2510 Atherosclerotic heart disease of native coronary artery without angina pectoris: Secondary | ICD-10-CM | POA: Diagnosis not present

## 2020-05-31 DIAGNOSIS — R000 Tachycardia, unspecified: Secondary | ICD-10-CM | POA: Diagnosis not present

## 2020-05-31 DIAGNOSIS — I4891 Unspecified atrial fibrillation: Secondary | ICD-10-CM | POA: Diagnosis not present

## 2020-05-31 DIAGNOSIS — J99 Respiratory disorders in diseases classified elsewhere: Secondary | ICD-10-CM | POA: Diagnosis not present

## 2020-05-31 DIAGNOSIS — D509 Iron deficiency anemia, unspecified: Secondary | ICD-10-CM | POA: Diagnosis not present

## 2020-05-31 DIAGNOSIS — D649 Anemia, unspecified: Secondary | ICD-10-CM | POA: Diagnosis not present

## 2020-05-31 DIAGNOSIS — A419 Sepsis, unspecified organism: Secondary | ICD-10-CM | POA: Diagnosis not present

## 2020-05-31 DIAGNOSIS — R52 Pain, unspecified: Secondary | ICD-10-CM | POA: Diagnosis not present

## 2020-05-31 DIAGNOSIS — M549 Dorsalgia, unspecified: Secondary | ICD-10-CM | POA: Diagnosis not present

## 2020-05-31 DIAGNOSIS — R6521 Severe sepsis with septic shock: Secondary | ICD-10-CM | POA: Diagnosis not present

## 2020-05-31 DIAGNOSIS — R630 Anorexia: Secondary | ICD-10-CM | POA: Diagnosis not present

## 2020-05-31 DIAGNOSIS — M6250 Muscle wasting and atrophy, not elsewhere classified, unspecified site: Secondary | ICD-10-CM | POA: Diagnosis not present

## 2020-05-31 DIAGNOSIS — R1310 Dysphagia, unspecified: Secondary | ICD-10-CM | POA: Diagnosis not present

## 2020-05-31 DIAGNOSIS — J984 Other disorders of lung: Secondary | ICD-10-CM | POA: Diagnosis not present

## 2020-06-03 DIAGNOSIS — R531 Weakness: Secondary | ICD-10-CM | POA: Diagnosis not present

## 2020-06-03 DIAGNOSIS — J9690 Respiratory failure, unspecified, unspecified whether with hypoxia or hypercapnia: Secondary | ICD-10-CM | POA: Diagnosis not present

## 2020-06-03 DIAGNOSIS — J690 Pneumonitis due to inhalation of food and vomit: Secondary | ICD-10-CM | POA: Diagnosis not present

## 2020-06-03 DIAGNOSIS — R339 Retention of urine, unspecified: Secondary | ICD-10-CM | POA: Diagnosis not present

## 2020-06-03 DIAGNOSIS — I2510 Atherosclerotic heart disease of native coronary artery without angina pectoris: Secondary | ICD-10-CM | POA: Diagnosis not present

## 2020-06-03 DIAGNOSIS — Z951 Presence of aortocoronary bypass graft: Secondary | ICD-10-CM | POA: Diagnosis not present

## 2020-06-03 DIAGNOSIS — J449 Chronic obstructive pulmonary disease, unspecified: Secondary | ICD-10-CM | POA: Diagnosis not present

## 2020-06-03 DIAGNOSIS — L89159 Pressure ulcer of sacral region, unspecified stage: Secondary | ICD-10-CM | POA: Diagnosis not present

## 2020-06-03 DIAGNOSIS — E119 Type 2 diabetes mellitus without complications: Secondary | ICD-10-CM | POA: Diagnosis not present

## 2020-06-04 DIAGNOSIS — L89154 Pressure ulcer of sacral region, stage 4: Secondary | ICD-10-CM | POA: Diagnosis not present

## 2020-06-04 DIAGNOSIS — L22 Diaper dermatitis: Secondary | ICD-10-CM | POA: Diagnosis not present

## 2020-06-04 DIAGNOSIS — G4700 Insomnia, unspecified: Secondary | ICD-10-CM | POA: Diagnosis not present

## 2020-06-04 DIAGNOSIS — F419 Anxiety disorder, unspecified: Secondary | ICD-10-CM | POA: Diagnosis not present

## 2020-06-04 DIAGNOSIS — Z872 Personal history of diseases of the skin and subcutaneous tissue: Secondary | ICD-10-CM | POA: Diagnosis not present

## 2020-06-05 DIAGNOSIS — K219 Gastro-esophageal reflux disease without esophagitis: Secondary | ICD-10-CM | POA: Diagnosis not present

## 2020-06-06 DIAGNOSIS — J449 Chronic obstructive pulmonary disease, unspecified: Secondary | ICD-10-CM | POA: Diagnosis not present

## 2020-06-06 DIAGNOSIS — D649 Anemia, unspecified: Secondary | ICD-10-CM | POA: Diagnosis not present

## 2020-06-06 DIAGNOSIS — J9621 Acute and chronic respiratory failure with hypoxia: Secondary | ICD-10-CM | POA: Diagnosis not present

## 2020-06-06 DIAGNOSIS — I2510 Atherosclerotic heart disease of native coronary artery without angina pectoris: Secondary | ICD-10-CM | POA: Diagnosis not present

## 2020-06-06 DIAGNOSIS — J439 Emphysema, unspecified: Secondary | ICD-10-CM | POA: Diagnosis not present

## 2020-06-06 DIAGNOSIS — J189 Pneumonia, unspecified organism: Secondary | ICD-10-CM | POA: Diagnosis not present

## 2020-06-06 DIAGNOSIS — L89154 Pressure ulcer of sacral region, stage 4: Secondary | ICD-10-CM | POA: Diagnosis not present

## 2020-06-06 DIAGNOSIS — J90 Pleural effusion, not elsewhere classified: Secondary | ICD-10-CM | POA: Diagnosis not present

## 2020-06-07 DIAGNOSIS — G4700 Insomnia, unspecified: Secondary | ICD-10-CM | POA: Diagnosis not present

## 2020-06-07 DIAGNOSIS — F419 Anxiety disorder, unspecified: Secondary | ICD-10-CM | POA: Diagnosis not present

## 2020-06-07 DIAGNOSIS — I482 Chronic atrial fibrillation, unspecified: Secondary | ICD-10-CM | POA: Diagnosis not present

## 2020-06-07 DIAGNOSIS — I10 Essential (primary) hypertension: Secondary | ICD-10-CM | POA: Diagnosis not present

## 2020-06-10 DIAGNOSIS — R198 Other specified symptoms and signs involving the digestive system and abdomen: Secondary | ICD-10-CM | POA: Diagnosis not present

## 2020-06-10 DIAGNOSIS — Z931 Gastrostomy status: Secondary | ICD-10-CM | POA: Diagnosis not present

## 2020-06-11 DIAGNOSIS — L89154 Pressure ulcer of sacral region, stage 4: Secondary | ICD-10-CM | POA: Diagnosis not present

## 2020-06-11 DIAGNOSIS — Z872 Personal history of diseases of the skin and subcutaneous tissue: Secondary | ICD-10-CM | POA: Diagnosis not present

## 2020-06-12 DIAGNOSIS — R1310 Dysphagia, unspecified: Secondary | ICD-10-CM | POA: Diagnosis not present

## 2020-06-14 DIAGNOSIS — M549 Dorsalgia, unspecified: Secondary | ICD-10-CM | POA: Diagnosis not present

## 2020-06-14 DIAGNOSIS — R198 Other specified symptoms and signs involving the digestive system and abdomen: Secondary | ICD-10-CM | POA: Diagnosis not present

## 2020-06-14 DIAGNOSIS — G8929 Other chronic pain: Secondary | ICD-10-CM | POA: Diagnosis not present

## 2020-06-14 DIAGNOSIS — Z931 Gastrostomy status: Secondary | ICD-10-CM | POA: Diagnosis not present

## 2020-06-18 DIAGNOSIS — Z931 Gastrostomy status: Secondary | ICD-10-CM | POA: Diagnosis not present

## 2020-06-18 DIAGNOSIS — R630 Anorexia: Secondary | ICD-10-CM | POA: Diagnosis not present

## 2020-06-18 DIAGNOSIS — Z872 Personal history of diseases of the skin and subcutaneous tissue: Secondary | ICD-10-CM | POA: Diagnosis not present

## 2020-06-18 DIAGNOSIS — E43 Unspecified severe protein-calorie malnutrition: Secondary | ICD-10-CM | POA: Diagnosis not present

## 2020-06-18 DIAGNOSIS — L98499 Non-pressure chronic ulcer of skin of other sites with unspecified severity: Secondary | ICD-10-CM | POA: Diagnosis not present

## 2020-06-18 DIAGNOSIS — L89154 Pressure ulcer of sacral region, stage 4: Secondary | ICD-10-CM | POA: Diagnosis not present

## 2020-06-19 DIAGNOSIS — D509 Iron deficiency anemia, unspecified: Secondary | ICD-10-CM | POA: Diagnosis not present

## 2020-06-19 DIAGNOSIS — I482 Chronic atrial fibrillation, unspecified: Secondary | ICD-10-CM | POA: Diagnosis not present

## 2020-06-25 DIAGNOSIS — L89154 Pressure ulcer of sacral region, stage 4: Secondary | ICD-10-CM | POA: Diagnosis not present

## 2020-06-28 DIAGNOSIS — Z931 Gastrostomy status: Secondary | ICD-10-CM | POA: Diagnosis not present

## 2020-06-28 DIAGNOSIS — R198 Other specified symptoms and signs involving the digestive system and abdomen: Secondary | ICD-10-CM | POA: Diagnosis not present

## 2020-06-28 DIAGNOSIS — R1310 Dysphagia, unspecified: Secondary | ICD-10-CM | POA: Diagnosis not present

## 2020-07-01 DIAGNOSIS — T148XXA Other injury of unspecified body region, initial encounter: Secondary | ICD-10-CM | POA: Diagnosis not present

## 2020-07-01 DIAGNOSIS — R52 Pain, unspecified: Secondary | ICD-10-CM | POA: Diagnosis not present

## 2020-07-01 DIAGNOSIS — L89154 Pressure ulcer of sacral region, stage 4: Secondary | ICD-10-CM | POA: Diagnosis not present

## 2020-07-02 DIAGNOSIS — L89154 Pressure ulcer of sacral region, stage 4: Secondary | ICD-10-CM | POA: Diagnosis not present

## 2020-07-05 DIAGNOSIS — R52 Pain, unspecified: Secondary | ICD-10-CM | POA: Diagnosis not present

## 2020-07-05 DIAGNOSIS — L89154 Pressure ulcer of sacral region, stage 4: Secondary | ICD-10-CM | POA: Diagnosis not present

## 2020-07-06 DIAGNOSIS — A419 Sepsis, unspecified organism: Secondary | ICD-10-CM | POA: Diagnosis not present

## 2020-07-06 DIAGNOSIS — J449 Chronic obstructive pulmonary disease, unspecified: Secondary | ICD-10-CM | POA: Diagnosis not present

## 2020-07-06 DIAGNOSIS — E46 Unspecified protein-calorie malnutrition: Secondary | ICD-10-CM | POA: Diagnosis not present

## 2020-07-06 DIAGNOSIS — Z03818 Encounter for observation for suspected exposure to other biological agents ruled out: Secondary | ICD-10-CM | POA: Diagnosis not present

## 2020-07-06 DIAGNOSIS — I82411 Acute embolism and thrombosis of right femoral vein: Secondary | ICD-10-CM | POA: Diagnosis not present

## 2020-07-06 DIAGNOSIS — J690 Pneumonitis due to inhalation of food and vomit: Secondary | ICD-10-CM | POA: Diagnosis not present

## 2020-07-06 DIAGNOSIS — R778 Other specified abnormalities of plasma proteins: Secondary | ICD-10-CM | POA: Diagnosis not present

## 2020-07-06 DIAGNOSIS — R6521 Severe sepsis with septic shock: Secondary | ICD-10-CM | POA: Diagnosis not present

## 2020-07-06 DIAGNOSIS — F32A Depression, unspecified: Secondary | ICD-10-CM | POA: Diagnosis not present

## 2020-07-06 DIAGNOSIS — N390 Urinary tract infection, site not specified: Secondary | ICD-10-CM | POA: Diagnosis not present

## 2020-07-06 DIAGNOSIS — D649 Anemia, unspecified: Secondary | ICD-10-CM | POA: Diagnosis not present

## 2020-07-06 DIAGNOSIS — J439 Emphysema, unspecified: Secondary | ICD-10-CM | POA: Diagnosis not present

## 2020-07-06 DIAGNOSIS — J90 Pleural effusion, not elsewhere classified: Secondary | ICD-10-CM | POA: Diagnosis not present

## 2020-07-06 DIAGNOSIS — R0600 Dyspnea, unspecified: Secondary | ICD-10-CM | POA: Diagnosis not present

## 2020-07-06 DIAGNOSIS — J984 Other disorders of lung: Secondary | ICD-10-CM | POA: Diagnosis not present

## 2020-07-06 DIAGNOSIS — R160 Hepatomegaly, not elsewhere classified: Secondary | ICD-10-CM | POA: Diagnosis not present

## 2020-07-06 DIAGNOSIS — G928 Other toxic encephalopathy: Secondary | ICD-10-CM | POA: Diagnosis not present

## 2020-07-06 DIAGNOSIS — I517 Cardiomegaly: Secondary | ICD-10-CM | POA: Diagnosis not present

## 2020-07-06 DIAGNOSIS — I4891 Unspecified atrial fibrillation: Secondary | ICD-10-CM | POA: Diagnosis not present

## 2020-07-06 DIAGNOSIS — J9601 Acute respiratory failure with hypoxia: Secondary | ICD-10-CM | POA: Diagnosis not present

## 2020-07-06 DIAGNOSIS — J189 Pneumonia, unspecified organism: Secondary | ICD-10-CM | POA: Diagnosis not present

## 2020-07-06 DIAGNOSIS — R509 Fever, unspecified: Secondary | ICD-10-CM | POA: Diagnosis not present

## 2020-07-06 DIAGNOSIS — R000 Tachycardia, unspecified: Secondary | ICD-10-CM | POA: Diagnosis not present

## 2020-07-06 DIAGNOSIS — E119 Type 2 diabetes mellitus without complications: Secondary | ICD-10-CM | POA: Diagnosis not present

## 2020-07-06 DIAGNOSIS — Z681 Body mass index (BMI) 19 or less, adult: Secondary | ICD-10-CM | POA: Diagnosis not present

## 2020-07-07 DIAGNOSIS — J439 Emphysema, unspecified: Secondary | ICD-10-CM | POA: Diagnosis not present

## 2020-07-07 DIAGNOSIS — J90 Pleural effusion, not elsewhere classified: Secondary | ICD-10-CM | POA: Diagnosis not present

## 2020-07-07 DIAGNOSIS — J189 Pneumonia, unspecified organism: Secondary | ICD-10-CM | POA: Diagnosis not present

## 2020-07-09 ENCOUNTER — Other Ambulatory Visit: Payer: Self-pay

## 2020-07-09 NOTE — Patient Outreach (Signed)
[facility]) Care Management  07/09/2020  [facility] [facility] Jan 02, 1956 [id number]   Referral Date: 07/09/20 Referral Source: [employer] Report Date of Discharge: 07/06/20 [facility]  [facility] Insurance: [facility]   Referral received.  Telephone call to [facility].  CM notified that patient is a patient there but is currently hospitalized.     Plan: RN CM will close case.    Bary Leriche, RN, MSN [facility] Care Management Care Management Coordinator Direct Line [phone] Toll Free: [phone]  Fax: [phone]

## 2020-07-10 DIAGNOSIS — I82411 Acute embolism and thrombosis of right femoral vein: Secondary | ICD-10-CM | POA: Diagnosis not present

## 2020-07-11 DIAGNOSIS — J189 Pneumonia, unspecified organism: Secondary | ICD-10-CM | POA: Diagnosis not present

## 2020-07-11 DIAGNOSIS — J449 Chronic obstructive pulmonary disease, unspecified: Secondary | ICD-10-CM | POA: Diagnosis not present

## 2020-07-19 DIAGNOSIS — Z111 Encounter for screening for respiratory tuberculosis: Secondary | ICD-10-CM | POA: Diagnosis not present

## 2020-07-19 DIAGNOSIS — R918 Other nonspecific abnormal finding of lung field: Secondary | ICD-10-CM | POA: Diagnosis not present

## 2020-07-19 DIAGNOSIS — Z434 Encounter for attention to other artificial openings of digestive tract: Secondary | ICD-10-CM | POA: Diagnosis not present

## 2020-07-19 DIAGNOSIS — R140 Abdominal distension (gaseous): Secondary | ICD-10-CM | POA: Diagnosis not present

## 2020-07-20 DIAGNOSIS — J449 Chronic obstructive pulmonary disease, unspecified: Secondary | ICD-10-CM | POA: Diagnosis not present

## 2020-07-20 DIAGNOSIS — D509 Iron deficiency anemia, unspecified: Secondary | ICD-10-CM | POA: Diagnosis not present

## 2020-07-20 IMAGING — RF DG C-ARM 61-120 MIN
1 series · 1 of 1 positions shown · non-contrast
Comparison: None.

CLINICAL DATA: Anterior cervical fusion of C4 through C7.

EXAM:
DG CERVICAL SPINE - 1 VIEW; DG C-ARM 61-120 MIN
FLUOROSCOPY TIME:  14 seconds.

[Series 1: run · 1 of 1 slices shown]
[im 1/1]
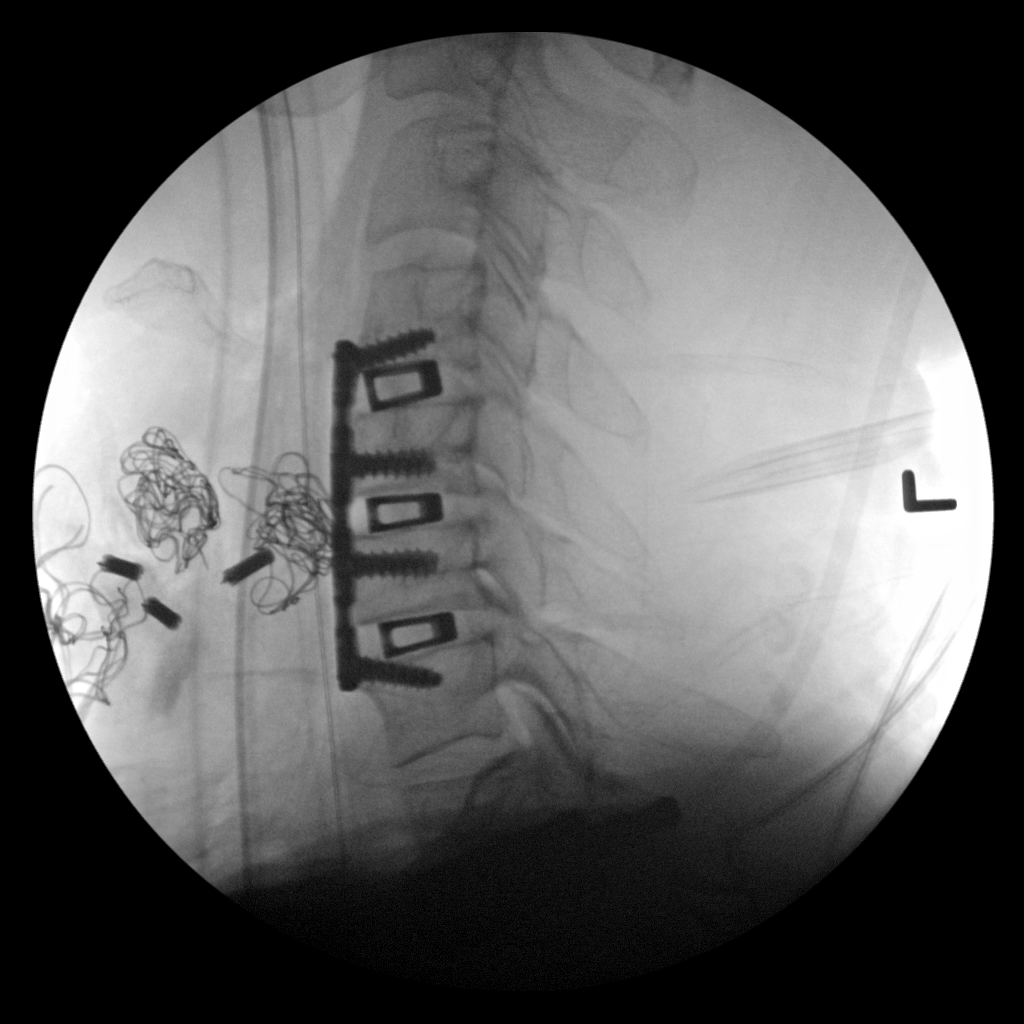

[1 of 1 positions shown; findings below may reference images not displayed]

FINDINGS: Single intraoperative fluoroscopic image of the cervical spine
demonstrates the patient be status post surgical anterior fusion of
C4-5, C5-6 and C6-7. Good alignment of vertebral bodies is noted.
IMPRESSION: Fluoroscopic guidance was provided during surgical anterior fusion
of lower cervical spine.

## 2020-07-22 DIAGNOSIS — Z931 Gastrostomy status: Secondary | ICD-10-CM | POA: Diagnosis not present

## 2020-07-22 DIAGNOSIS — R197 Diarrhea, unspecified: Secondary | ICD-10-CM | POA: Diagnosis not present

## 2020-07-23 DIAGNOSIS — L89154 Pressure ulcer of sacral region, stage 4: Secondary | ICD-10-CM | POA: Diagnosis not present

## 2020-07-24 DIAGNOSIS — K219 Gastro-esophageal reflux disease without esophagitis: Secondary | ICD-10-CM | POA: Diagnosis not present

## 2020-07-26 DIAGNOSIS — D649 Anemia, unspecified: Secondary | ICD-10-CM | POA: Diagnosis not present

## 2020-07-26 DIAGNOSIS — I10 Essential (primary) hypertension: Secondary | ICD-10-CM | POA: Diagnosis not present

## 2020-07-29 DIAGNOSIS — E611 Iron deficiency: Secondary | ICD-10-CM | POA: Diagnosis not present

## 2020-07-30 DIAGNOSIS — Z872 Personal history of diseases of the skin and subcutaneous tissue: Secondary | ICD-10-CM | POA: Diagnosis not present

## 2020-07-30 DIAGNOSIS — L89154 Pressure ulcer of sacral region, stage 4: Secondary | ICD-10-CM | POA: Diagnosis not present

## 2020-08-02 DIAGNOSIS — R6521 Severe sepsis with septic shock: Secondary | ICD-10-CM | POA: Diagnosis not present

## 2020-08-02 DIAGNOSIS — N390 Urinary tract infection, site not specified: Secondary | ICD-10-CM | POA: Diagnosis not present

## 2020-08-02 DIAGNOSIS — L89154 Pressure ulcer of sacral region, stage 4: Secondary | ICD-10-CM | POA: Diagnosis not present

## 2020-08-02 DIAGNOSIS — J449 Chronic obstructive pulmonary disease, unspecified: Secondary | ICD-10-CM | POA: Diagnosis not present

## 2020-08-02 DIAGNOSIS — I2510 Atherosclerotic heart disease of native coronary artery without angina pectoris: Secondary | ICD-10-CM | POA: Diagnosis not present

## 2020-08-02 DIAGNOSIS — J9601 Acute respiratory failure with hypoxia: Secondary | ICD-10-CM | POA: Diagnosis not present

## 2020-08-02 DIAGNOSIS — J189 Pneumonia, unspecified organism: Secondary | ICD-10-CM | POA: Diagnosis not present

## 2020-08-02 DIAGNOSIS — G9340 Encephalopathy, unspecified: Secondary | ICD-10-CM | POA: Diagnosis not present

## 2020-08-05 DIAGNOSIS — R531 Weakness: Secondary | ICD-10-CM | POA: Diagnosis not present

## 2020-08-05 DIAGNOSIS — F3289 Other specified depressive episodes: Secondary | ICD-10-CM | POA: Diagnosis not present

## 2020-08-05 DIAGNOSIS — F5101 Primary insomnia: Secondary | ICD-10-CM | POA: Diagnosis not present

## 2020-08-06 DIAGNOSIS — Z872 Personal history of diseases of the skin and subcutaneous tissue: Secondary | ICD-10-CM | POA: Diagnosis not present

## 2020-08-06 DIAGNOSIS — L89154 Pressure ulcer of sacral region, stage 4: Secondary | ICD-10-CM | POA: Diagnosis not present

## 2020-08-09 DIAGNOSIS — Z931 Gastrostomy status: Secondary | ICD-10-CM | POA: Diagnosis not present

## 2020-08-12 DIAGNOSIS — E039 Hypothyroidism, unspecified: Secondary | ICD-10-CM | POA: Diagnosis not present

## 2020-08-12 DIAGNOSIS — R531 Weakness: Secondary | ICD-10-CM | POA: Diagnosis not present

## 2020-08-12 DIAGNOSIS — L89154 Pressure ulcer of sacral region, stage 4: Secondary | ICD-10-CM | POA: Diagnosis not present

## 2020-08-12 DIAGNOSIS — E43 Unspecified severe protein-calorie malnutrition: Secondary | ICD-10-CM | POA: Diagnosis not present

## 2020-08-12 DIAGNOSIS — J9621 Acute and chronic respiratory failure with hypoxia: Secondary | ICD-10-CM | POA: Diagnosis not present

## 2020-08-12 DIAGNOSIS — Z931 Gastrostomy status: Secondary | ICD-10-CM | POA: Diagnosis not present

## 2020-08-12 DIAGNOSIS — K219 Gastro-esophageal reflux disease without esophagitis: Secondary | ICD-10-CM | POA: Diagnosis not present

## 2020-08-12 DIAGNOSIS — I2510 Atherosclerotic heart disease of native coronary artery without angina pectoris: Secondary | ICD-10-CM | POA: Diagnosis not present

## 2020-08-13 DIAGNOSIS — Z872 Personal history of diseases of the skin and subcutaneous tissue: Secondary | ICD-10-CM | POA: Diagnosis not present

## 2020-08-13 DIAGNOSIS — L89154 Pressure ulcer of sacral region, stage 4: Secondary | ICD-10-CM | POA: Diagnosis not present

## 2020-08-14 DIAGNOSIS — I4891 Unspecified atrial fibrillation: Secondary | ICD-10-CM | POA: Diagnosis not present

## 2020-08-14 DIAGNOSIS — E119 Type 2 diabetes mellitus without complications: Secondary | ICD-10-CM | POA: Diagnosis not present

## 2020-08-14 DIAGNOSIS — D509 Iron deficiency anemia, unspecified: Secondary | ICD-10-CM | POA: Diagnosis not present

## 2020-08-14 DIAGNOSIS — L8994 Pressure ulcer of unspecified site, stage 4: Secondary | ICD-10-CM | POA: Diagnosis not present

## 2020-08-14 DIAGNOSIS — E039 Hypothyroidism, unspecified: Secondary | ICD-10-CM | POA: Diagnosis not present

## 2020-08-19 DIAGNOSIS — I4891 Unspecified atrial fibrillation: Secondary | ICD-10-CM | POA: Diagnosis not present

## 2020-08-19 DIAGNOSIS — E039 Hypothyroidism, unspecified: Secondary | ICD-10-CM | POA: Diagnosis not present

## 2020-08-19 DIAGNOSIS — I25810 Atherosclerosis of coronary artery bypass graft(s) without angina pectoris: Secondary | ICD-10-CM | POA: Diagnosis not present

## 2020-08-19 DIAGNOSIS — J449 Chronic obstructive pulmonary disease, unspecified: Secondary | ICD-10-CM | POA: Diagnosis not present

## 2020-08-19 DIAGNOSIS — E119 Type 2 diabetes mellitus without complications: Secondary | ICD-10-CM | POA: Diagnosis not present

## 2020-08-19 DIAGNOSIS — I779 Disorder of arteries and arterioles, unspecified: Secondary | ICD-10-CM | POA: Diagnosis not present

## 2020-08-19 DIAGNOSIS — L8994 Pressure ulcer of unspecified site, stage 4: Secondary | ICD-10-CM | POA: Diagnosis not present

## 2020-08-19 DIAGNOSIS — D509 Iron deficiency anemia, unspecified: Secondary | ICD-10-CM | POA: Diagnosis not present

## 2020-08-20 DIAGNOSIS — L853 Xerosis cutis: Secondary | ICD-10-CM | POA: Diagnosis not present

## 2020-08-20 DIAGNOSIS — L89154 Pressure ulcer of sacral region, stage 4: Secondary | ICD-10-CM | POA: Diagnosis not present

## 2020-08-21 DIAGNOSIS — R5381 Other malaise: Secondary | ICD-10-CM | POA: Diagnosis not present

## 2020-08-21 DIAGNOSIS — E119 Type 2 diabetes mellitus without complications: Secondary | ICD-10-CM | POA: Diagnosis not present

## 2020-08-21 DIAGNOSIS — E785 Hyperlipidemia, unspecified: Secondary | ICD-10-CM | POA: Diagnosis not present

## 2020-08-21 DIAGNOSIS — I2510 Atherosclerotic heart disease of native coronary artery without angina pectoris: Secondary | ICD-10-CM | POA: Diagnosis not present

## 2020-08-22 DIAGNOSIS — E559 Vitamin D deficiency, unspecified: Secondary | ICD-10-CM | POA: Diagnosis not present

## 2020-08-22 DIAGNOSIS — J449 Chronic obstructive pulmonary disease, unspecified: Secondary | ICD-10-CM | POA: Diagnosis not present

## 2020-08-22 DIAGNOSIS — E876 Hypokalemia: Secondary | ICD-10-CM | POA: Diagnosis not present

## 2020-08-22 DIAGNOSIS — E039 Hypothyroidism, unspecified: Secondary | ICD-10-CM | POA: Diagnosis not present

## 2020-08-22 DIAGNOSIS — E119 Type 2 diabetes mellitus without complications: Secondary | ICD-10-CM | POA: Diagnosis not present

## 2020-08-22 DIAGNOSIS — I10 Essential (primary) hypertension: Secondary | ICD-10-CM | POA: Diagnosis not present

## 2020-08-22 DIAGNOSIS — R918 Other nonspecific abnormal finding of lung field: Secondary | ICD-10-CM | POA: Diagnosis not present

## 2020-08-23 DIAGNOSIS — R9431 Abnormal electrocardiogram [ECG] [EKG]: Secondary | ICD-10-CM | POA: Diagnosis not present

## 2020-08-23 DIAGNOSIS — J449 Chronic obstructive pulmonary disease, unspecified: Secondary | ICD-10-CM | POA: Diagnosis not present

## 2020-08-27 DIAGNOSIS — L89154 Pressure ulcer of sacral region, stage 4: Secondary | ICD-10-CM | POA: Diagnosis not present

## 2020-08-28 DIAGNOSIS — E039 Hypothyroidism, unspecified: Secondary | ICD-10-CM | POA: Diagnosis not present

## 2020-08-28 DIAGNOSIS — E559 Vitamin D deficiency, unspecified: Secondary | ICD-10-CM | POA: Diagnosis not present

## 2020-08-28 DIAGNOSIS — D509 Iron deficiency anemia, unspecified: Secondary | ICD-10-CM | POA: Diagnosis not present

## 2020-08-28 DIAGNOSIS — E785 Hyperlipidemia, unspecified: Secondary | ICD-10-CM | POA: Diagnosis not present

## 2020-09-03 DIAGNOSIS — R5381 Other malaise: Secondary | ICD-10-CM | POA: Diagnosis not present

## 2020-09-03 DIAGNOSIS — E039 Hypothyroidism, unspecified: Secondary | ICD-10-CM | POA: Diagnosis not present

## 2020-09-03 DIAGNOSIS — L8994 Pressure ulcer of unspecified site, stage 4: Secondary | ICD-10-CM | POA: Diagnosis not present

## 2020-09-03 DIAGNOSIS — J449 Chronic obstructive pulmonary disease, unspecified: Secondary | ICD-10-CM | POA: Diagnosis not present

## 2020-09-03 DIAGNOSIS — D509 Iron deficiency anemia, unspecified: Secondary | ICD-10-CM | POA: Diagnosis not present

## 2020-09-03 DIAGNOSIS — L89154 Pressure ulcer of sacral region, stage 4: Secondary | ICD-10-CM | POA: Diagnosis not present

## 2020-09-03 DIAGNOSIS — E559 Vitamin D deficiency, unspecified: Secondary | ICD-10-CM | POA: Diagnosis not present

## 2020-09-03 DIAGNOSIS — I4891 Unspecified atrial fibrillation: Secondary | ICD-10-CM | POA: Diagnosis not present

## 2020-09-03 DIAGNOSIS — E785 Hyperlipidemia, unspecified: Secondary | ICD-10-CM | POA: Diagnosis not present

## 2020-09-04 DIAGNOSIS — R6889 Other general symptoms and signs: Secondary | ICD-10-CM | POA: Diagnosis not present

## 2020-09-04 DIAGNOSIS — Z9989 Dependence on other enabling machines and devices: Secondary | ICD-10-CM | POA: Diagnosis not present

## 2020-09-04 DIAGNOSIS — R4189 Other symptoms and signs involving cognitive functions and awareness: Secondary | ICD-10-CM | POA: Diagnosis not present

## 2020-09-09 DIAGNOSIS — R339 Retention of urine, unspecified: Secondary | ICD-10-CM | POA: Diagnosis not present

## 2020-09-10 DIAGNOSIS — L89154 Pressure ulcer of sacral region, stage 4: Secondary | ICD-10-CM | POA: Diagnosis not present

## 2020-09-16 DIAGNOSIS — E43 Unspecified severe protein-calorie malnutrition: Secondary | ICD-10-CM | POA: Diagnosis not present

## 2020-09-16 DIAGNOSIS — J9610 Chronic respiratory failure, unspecified whether with hypoxia or hypercapnia: Secondary | ICD-10-CM | POA: Diagnosis not present

## 2020-09-16 DIAGNOSIS — Z743 Need for continuous supervision: Secondary | ICD-10-CM | POA: Diagnosis not present

## 2020-09-16 DIAGNOSIS — N400 Enlarged prostate without lower urinary tract symptoms: Secondary | ICD-10-CM | POA: Diagnosis not present

## 2020-09-16 DIAGNOSIS — L89154 Pressure ulcer of sacral region, stage 4: Secondary | ICD-10-CM | POA: Diagnosis not present

## 2020-09-16 DIAGNOSIS — Z681 Body mass index (BMI) 19 or less, adult: Secondary | ICD-10-CM | POA: Diagnosis not present

## 2020-09-16 DIAGNOSIS — K9423 Gastrostomy malfunction: Secondary | ICD-10-CM | POA: Diagnosis not present

## 2020-09-16 DIAGNOSIS — R031 Nonspecific low blood-pressure reading: Secondary | ICD-10-CM | POA: Diagnosis not present

## 2020-09-16 DIAGNOSIS — Z20822 Contact with and (suspected) exposure to covid-19: Secondary | ICD-10-CM | POA: Diagnosis not present

## 2020-09-16 DIAGNOSIS — J9811 Atelectasis: Secondary | ICD-10-CM | POA: Diagnosis not present

## 2020-09-16 DIAGNOSIS — A419 Sepsis, unspecified organism: Secondary | ICD-10-CM | POA: Diagnosis not present

## 2020-09-16 DIAGNOSIS — Z431 Encounter for attention to gastrostomy: Secondary | ICD-10-CM | POA: Diagnosis not present

## 2020-09-16 DIAGNOSIS — D509 Iron deficiency anemia, unspecified: Secondary | ICD-10-CM | POA: Diagnosis not present

## 2020-09-16 DIAGNOSIS — R109 Unspecified abdominal pain: Secondary | ICD-10-CM | POA: Diagnosis not present

## 2020-09-16 DIAGNOSIS — L929 Granulomatous disorder of the skin and subcutaneous tissue, unspecified: Secondary | ICD-10-CM | POA: Diagnosis not present

## 2020-09-16 DIAGNOSIS — R64 Cachexia: Secondary | ICD-10-CM | POA: Diagnosis not present

## 2020-09-16 DIAGNOSIS — K449 Diaphragmatic hernia without obstruction or gangrene: Secondary | ICD-10-CM | POA: Diagnosis not present

## 2020-09-16 DIAGNOSIS — K9420 Gastrostomy complication, unspecified: Secondary | ICD-10-CM | POA: Diagnosis not present

## 2020-09-16 DIAGNOSIS — R5381 Other malaise: Secondary | ICD-10-CM | POA: Diagnosis not present

## 2020-09-16 DIAGNOSIS — J449 Chronic obstructive pulmonary disease, unspecified: Secondary | ICD-10-CM | POA: Diagnosis not present

## 2020-09-16 DIAGNOSIS — K279 Peptic ulcer, site unspecified, unspecified as acute or chronic, without hemorrhage or perforation: Secondary | ICD-10-CM | POA: Diagnosis not present

## 2020-09-16 DIAGNOSIS — R531 Weakness: Secondary | ICD-10-CM | POA: Diagnosis not present

## 2020-09-16 DIAGNOSIS — R918 Other nonspecific abnormal finding of lung field: Secondary | ICD-10-CM | POA: Diagnosis not present

## 2020-09-16 DIAGNOSIS — Z7401 Bed confinement status: Secondary | ICD-10-CM | POA: Diagnosis not present

## 2021-07-15 IMAGING — DX DG CHEST 1V PORT
1 series · 2 of 2 positions shown · non-contrast
Comparison: Yesterday

CLINICAL DATA: Hypoxia

EXAM:
PORTABLE CHEST 1 VIEW

[Series 1: chest · 0.14mm/px · 2 of 2 slices shown]
[im 1/2]
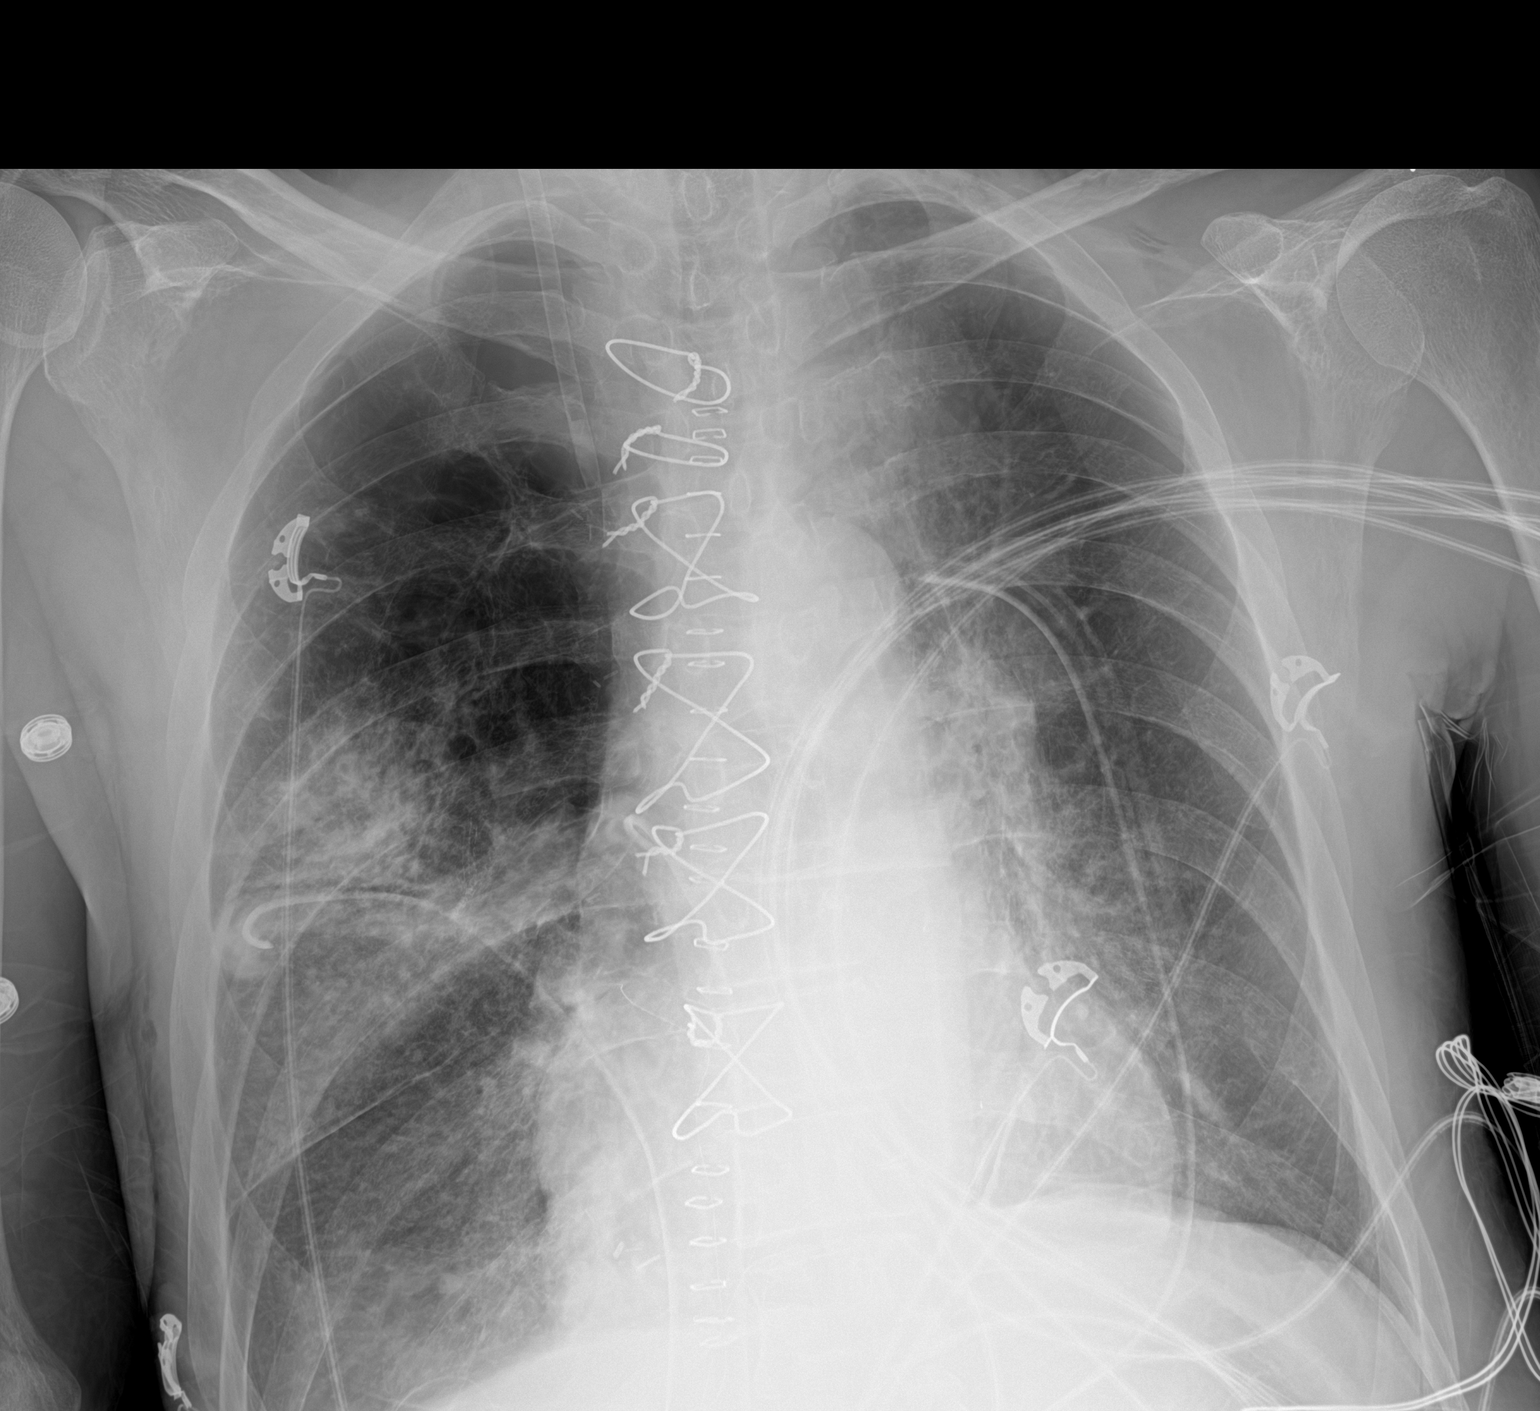
[im 2/2]
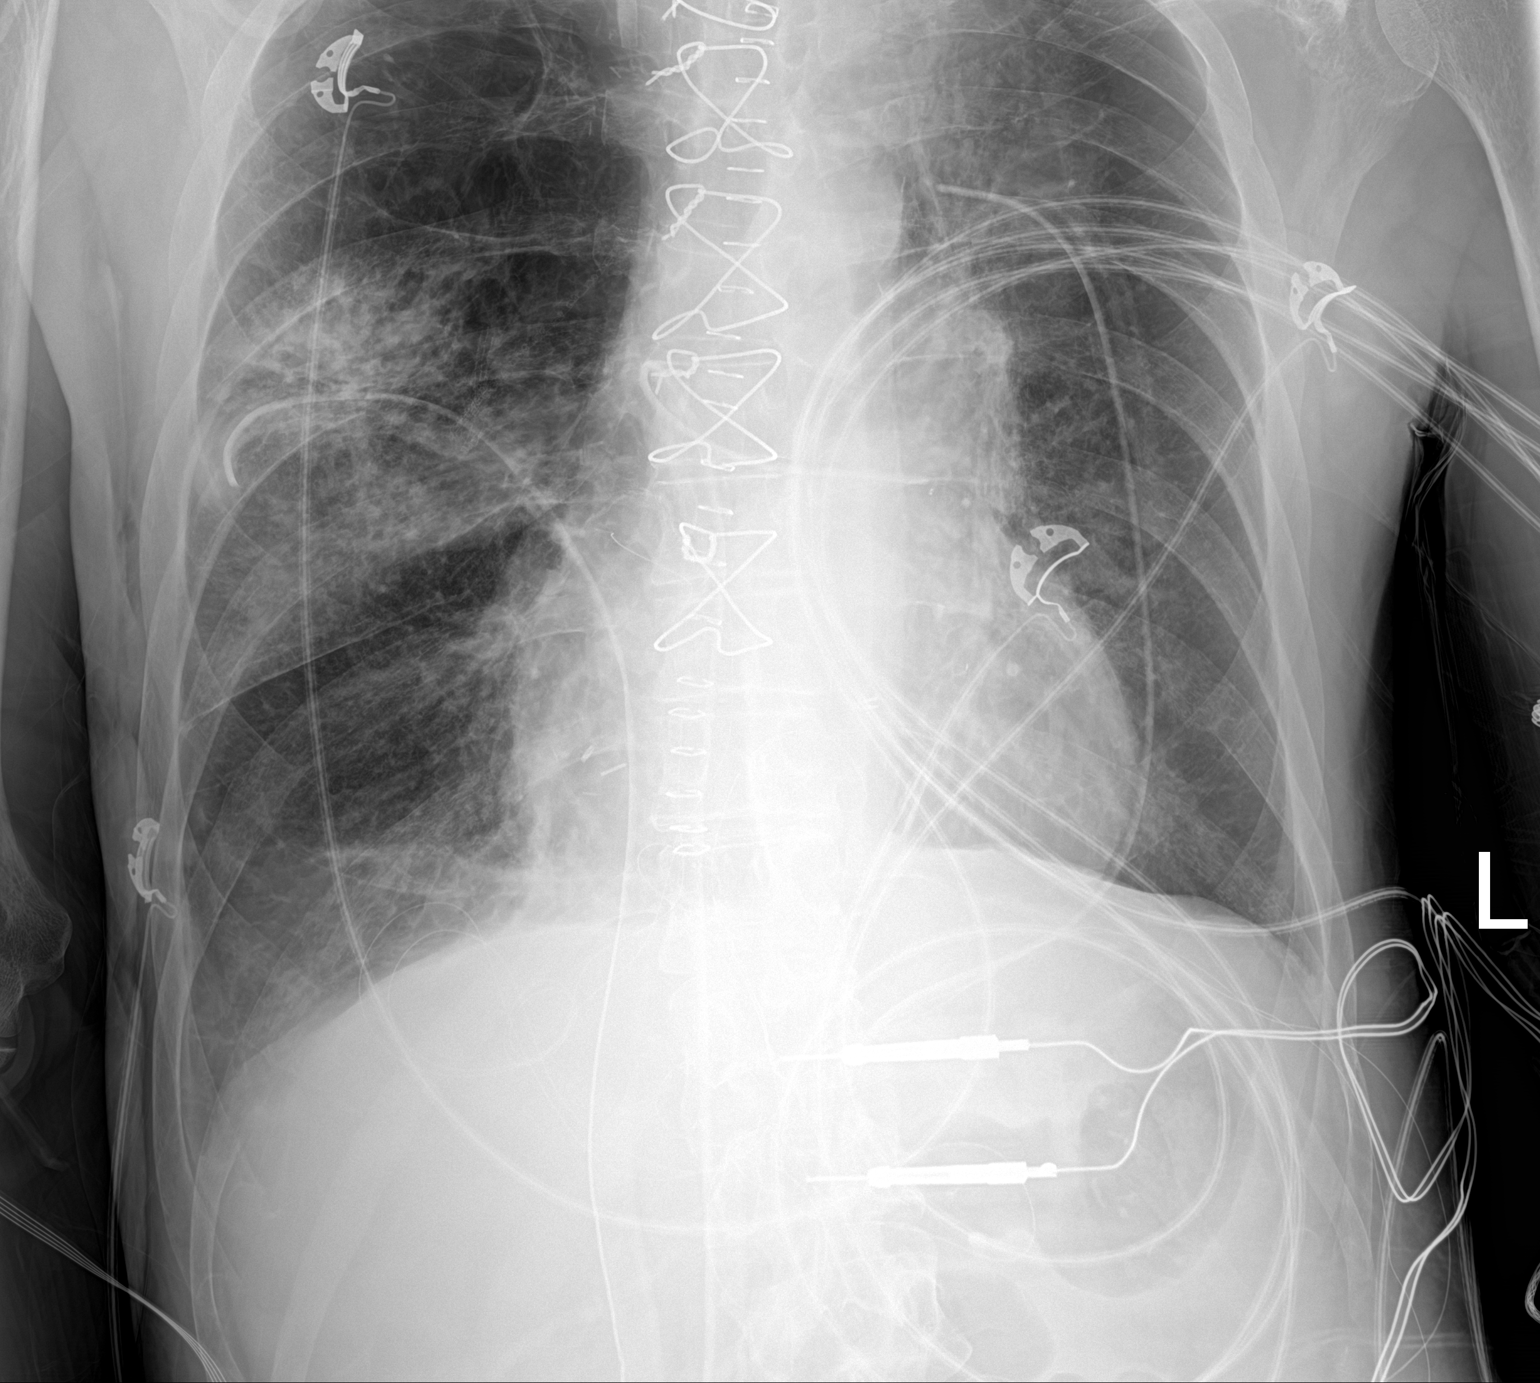

[2 of 2 positions shown; findings below may reference images not displayed]

FINDINGS: Soft tissue emphysema newly seen at the left apex. Trace left apical
pneumothorax is stable or decreased. Chest tubes in place. Right IJ
sheath is in stable position. Streaky pulmonary opacities with
increased on the left about the hilum. There is emphysema.
IMPRESSION: 1. No progression of the trace left apical pneumothorax. There is
new soft tissue gas in the left upper chest.
2. Increased left perihilar opacity, presumably atelectasis.

## 2021-07-15 IMAGING — DX DG ABD PORTABLE 1V
1 series · 1 of 1 positions shown · non-contrast
Comparison: Portable exam 1716 hours compared to [DATE] hours

CLINICAL DATA: Advanced nasogastric tube

EXAM:
PORTABLE ABDOMEN - 1 VIEW

[abdomen]
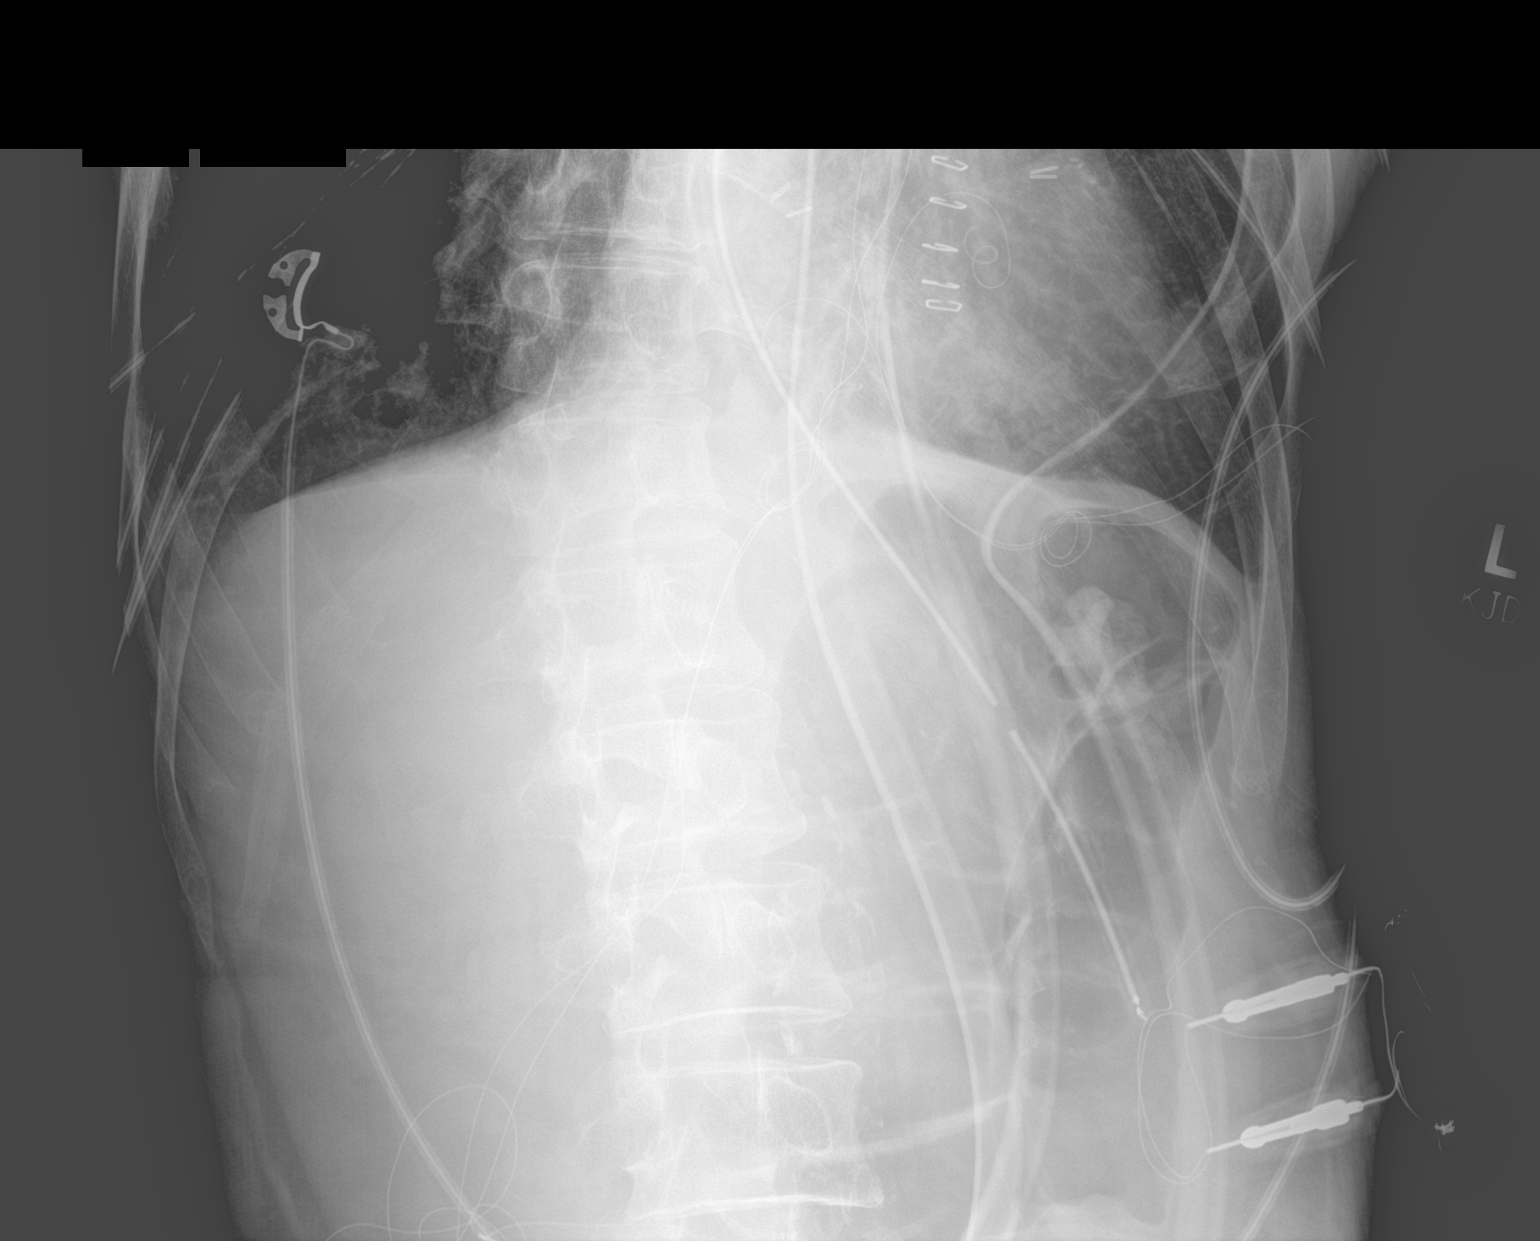

[1 of 1 positions shown; findings below may reference images not displayed]

FINDINGS: Nasogastric tube is been advanced and proximal side-port now
projects over stomach.

Chest tubes and epicardial pacing wires again seen.

Gas-filled splenic flexure colon.
IMPRESSION: Nasogastric tube now projects over stomach.

## 2021-07-15 IMAGING — DX DG ABDOMEN 1V
1 series · 1 of 1 positions shown · non-contrast
Comparison: Portable exam 9499 hours without priors for comparison

CLINICAL DATA: Orogastric tube placement

EXAM:
ABDOMEN - 1 VIEW

[abdomen kub]
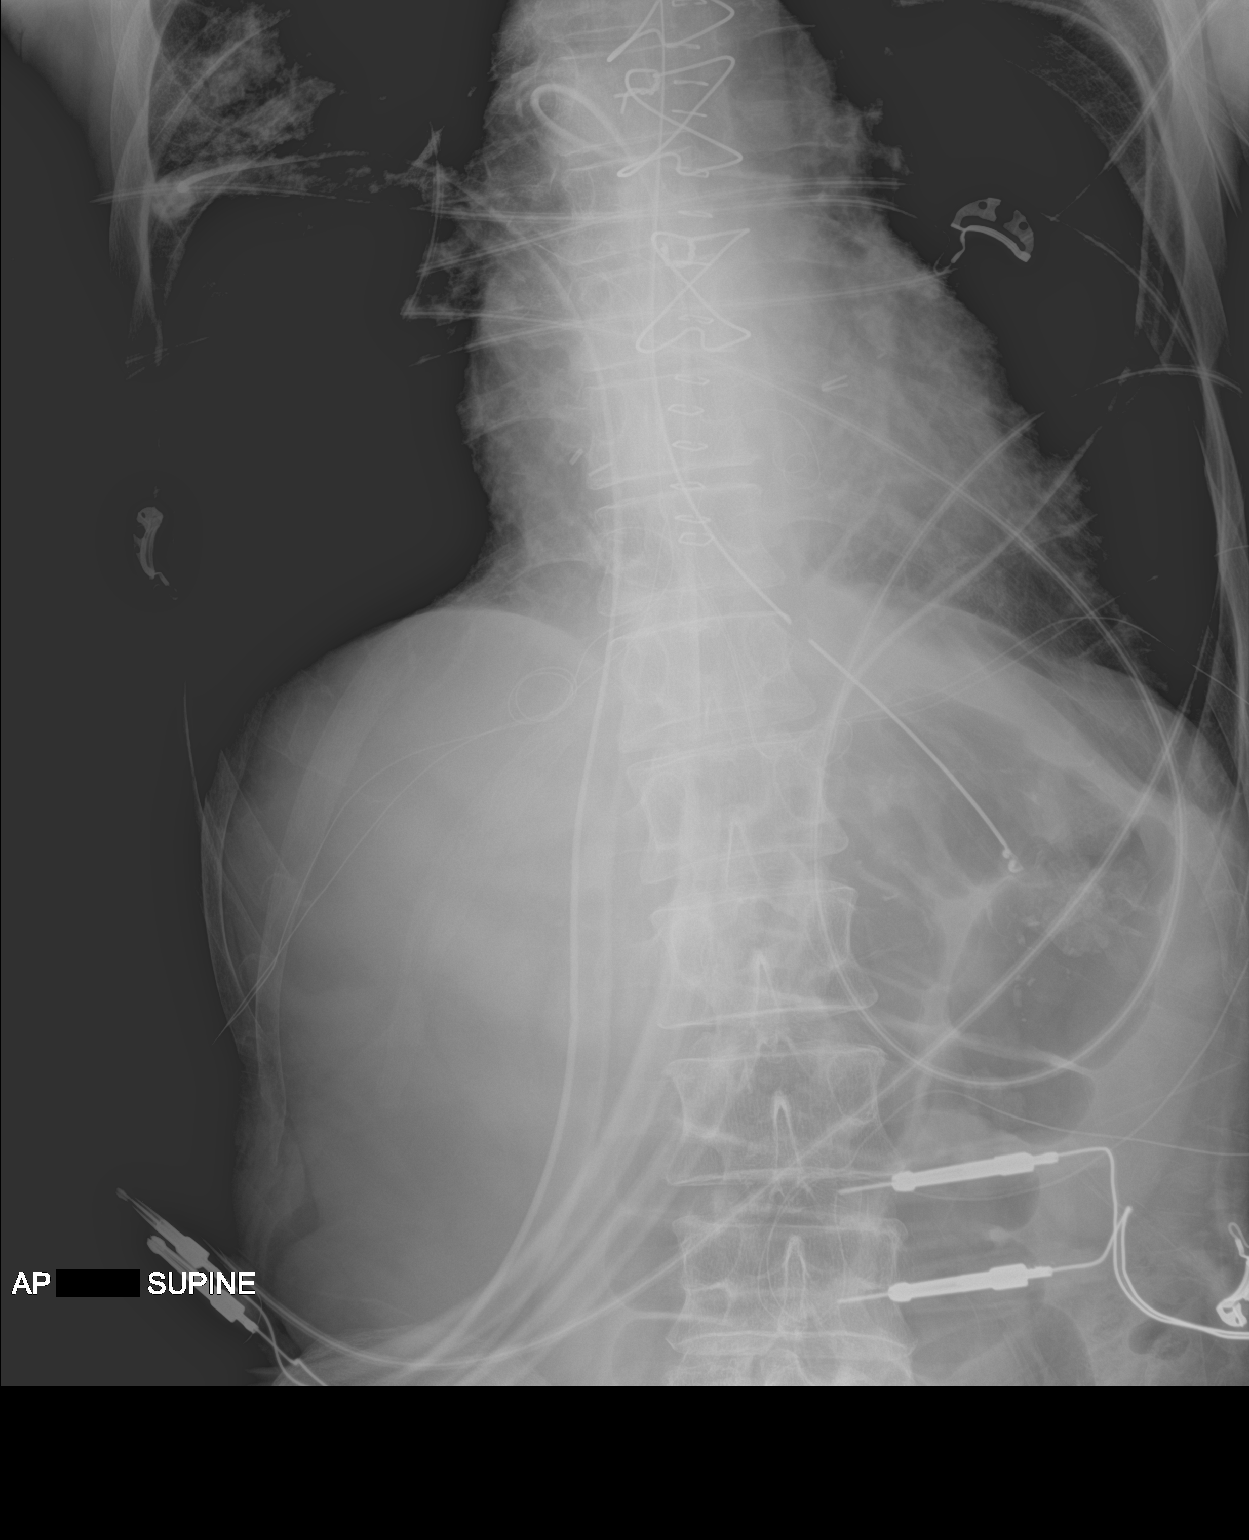

[1 of 1 positions shown; findings below may reference images not displayed]

FINDINGS: Tip of orogastric tube projects over stomach though the proximal
side-port is likely at/just above the gastroesophageal junction,
recommend advancing tube 4 cm.

Epicardial pacing wires, mediastinal drain and BILATERAL
thoracostomy tubes present.

Bowel gas pattern normal.

Overpenetrated lung bases.
IMPRESSION: Recommend advancing orogastric tube 4 cm.

## 2021-08-28 IMAGING — DX DG CHEST 1V PORT
1 series · 1 of 1 positions shown · non-contrast
Comparison: Chest x-ray 03/21/2020.

CLINICAL DATA: 64-year-old male with history of pneumonia.

EXAM:
PORTABLE CHEST 1 VIEW

[chest ap]
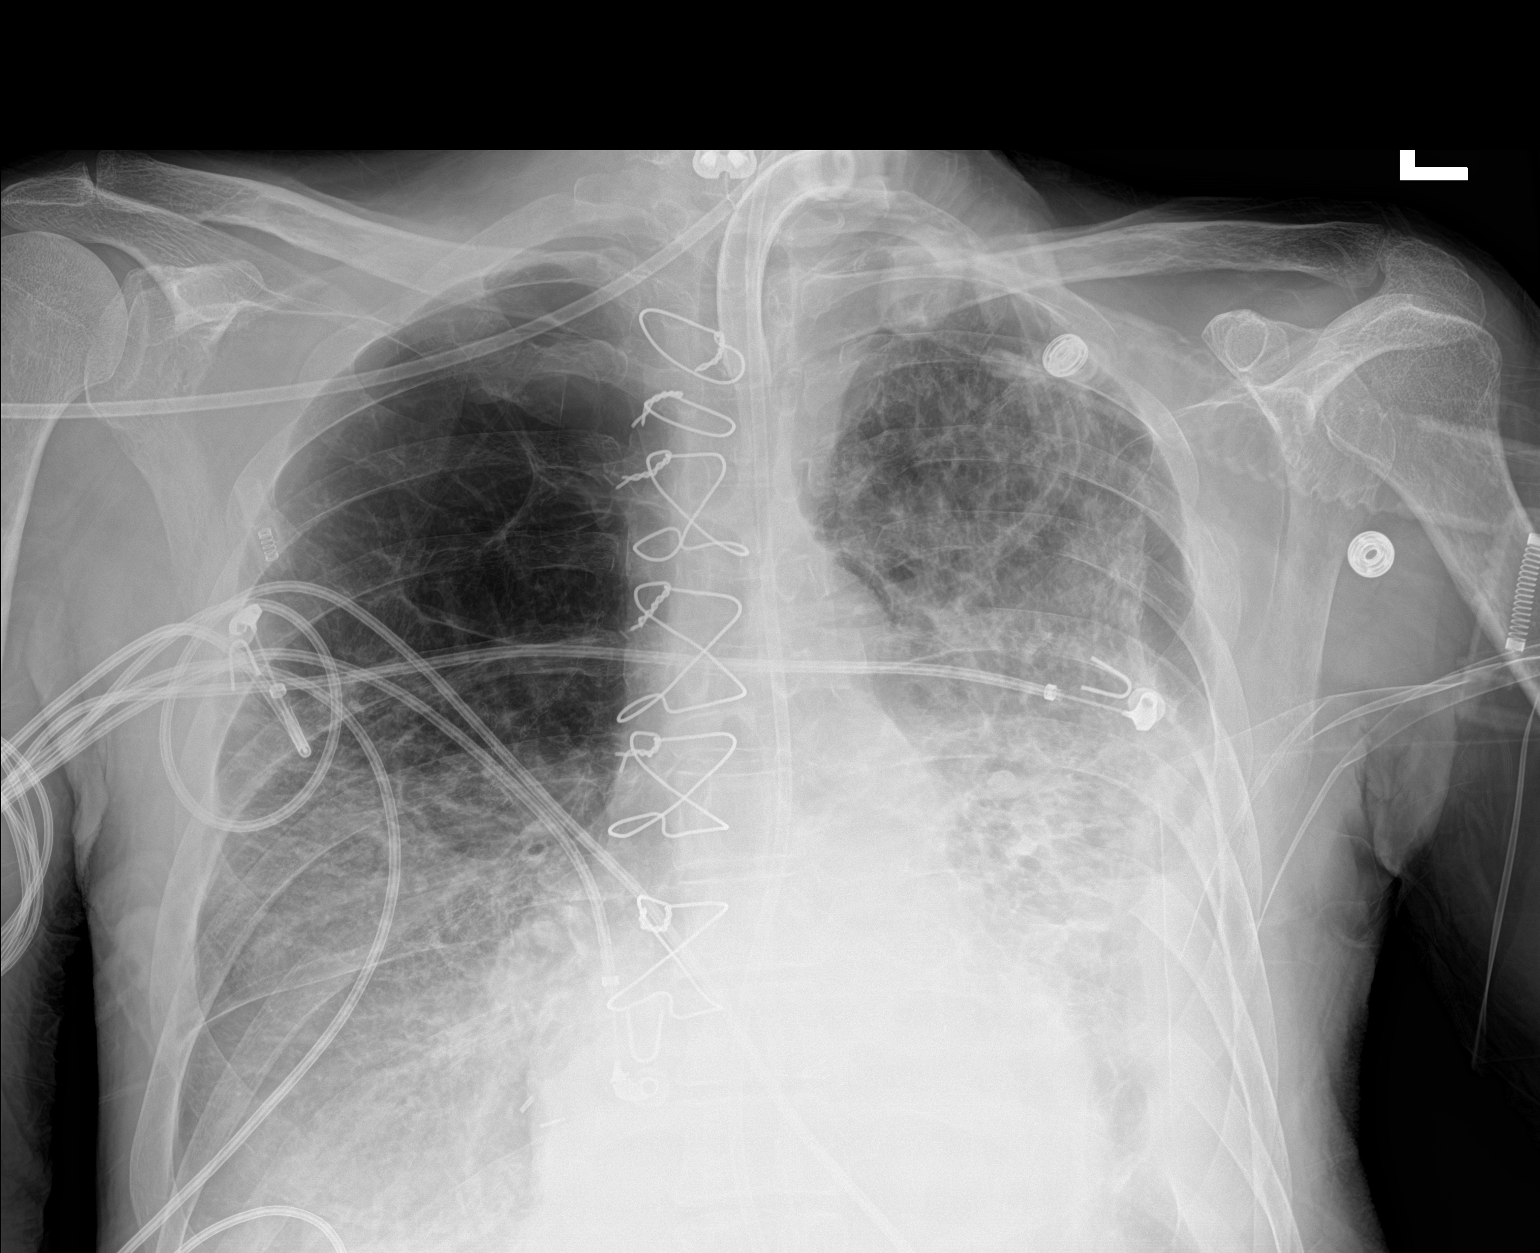

[1 of 1 positions shown; findings below may reference images not displayed]

FINDINGS: Limited study which does not visualized the lung bases bilaterally.
A tracheostomy tube is in place with tip 9.0 cm above the carina. A
feeding tube is seen extending into the abdomen, however, the tip of
the feeding tube extends below the lower margin of the image.
Left-sided pleural effusion. There also may be a small left-sided
pneumothorax (i.e., there is likely a left-sided hydropneumothorax).
Multifocal interstitial and airspace disease throughout the lungs
bilaterally, with relative sparing of the right upper lobe. Overall,
aeration is very similar to the recent prior study. Advanced
emphysematous changes. Heart size is normal. Upper mediastinal
contours are within normal limits. Status post median sternotomy.
Orthopedic fixation hardware in the lower cervical spine
incompletely imaged.
IMPRESSION: 1. Postoperative changes and support apparatus, as above.
2. Probable left-sided hydropneumothorax with small pneumothorax
component and moderate pleural effusion.
3. Severe multilobar bilateral pneumonia redemonstrated (left
greater than right), similar to the prior study.
4. Emphysema.

These results will be called to the ordering clinician or
representative by the Radiologist Assistant, and communication
documented in the PACS or [REDACTED].
